# Patient Record
Sex: Male | Born: 1950
Health system: Southern US, Community
[De-identification: ages and names within clinical notes are randomized; demographics above are authoritative.]

## PROBLEM LIST (undated history)

## (undated) DIAGNOSIS — A4902 Methicillin resistant Staphylococcus aureus infection, unspecified site: Secondary | ICD-10-CM

## (undated) DIAGNOSIS — B3324 Viral cardiomyopathy: Secondary | ICD-10-CM

## (undated) DIAGNOSIS — T7840XA Allergy, unspecified, initial encounter: Secondary | ICD-10-CM

## (undated) DIAGNOSIS — I429 Cardiomyopathy, unspecified: Secondary | ICD-10-CM

## (undated) DIAGNOSIS — F1190 Opioid use, unspecified, uncomplicated: Secondary | ICD-10-CM

## (undated) DIAGNOSIS — I10 Essential (primary) hypertension: Secondary | ICD-10-CM

## (undated) DIAGNOSIS — T8859XA Other complications of anesthesia, initial encounter: Secondary | ICD-10-CM

## (undated) DIAGNOSIS — E039 Hypothyroidism, unspecified: Secondary | ICD-10-CM

## (undated) DIAGNOSIS — R748 Abnormal levels of other serum enzymes: Secondary | ICD-10-CM

## (undated) DIAGNOSIS — F1910 Other psychoactive substance abuse, uncomplicated: Secondary | ICD-10-CM

## (undated) DIAGNOSIS — G4733 Obstructive sleep apnea (adult) (pediatric): Secondary | ICD-10-CM

## (undated) DIAGNOSIS — Z923 Personal history of irradiation: Secondary | ICD-10-CM

## (undated) DIAGNOSIS — T4145XA Adverse effect of unspecified anesthetic, initial encounter: Secondary | ICD-10-CM

## (undated) DIAGNOSIS — I5041 Acute combined systolic (congestive) and diastolic (congestive) heart failure: Secondary | ICD-10-CM

## (undated) DIAGNOSIS — K5730 Diverticulosis of large intestine without perforation or abscess without bleeding: Secondary | ICD-10-CM

## (undated) DIAGNOSIS — J45909 Unspecified asthma, uncomplicated: Secondary | ICD-10-CM

## (undated) DIAGNOSIS — Z973 Presence of spectacles and contact lenses: Secondary | ICD-10-CM

## (undated) DIAGNOSIS — N189 Chronic kidney disease, unspecified: Secondary | ICD-10-CM

## (undated) DIAGNOSIS — M549 Dorsalgia, unspecified: Secondary | ICD-10-CM

## (undated) DIAGNOSIS — M1990 Unspecified osteoarthritis, unspecified site: Secondary | ICD-10-CM

## (undated) DIAGNOSIS — D62 Acute posthemorrhagic anemia: Secondary | ICD-10-CM

## (undated) DIAGNOSIS — R0600 Dyspnea, unspecified: Secondary | ICD-10-CM

## (undated) DIAGNOSIS — N400 Enlarged prostate without lower urinary tract symptoms: Secondary | ICD-10-CM

## (undated) DIAGNOSIS — K635 Polyp of colon: Secondary | ICD-10-CM

## (undated) DIAGNOSIS — Z789 Other specified health status: Secondary | ICD-10-CM

## (undated) DIAGNOSIS — Z972 Presence of dental prosthetic device (complete) (partial): Secondary | ICD-10-CM

## (undated) HISTORY — DX: Other psychoactive substance abuse, uncomplicated: F19.10

## (undated) HISTORY — DX: Unspecified asthma, uncomplicated: J45.909

## (undated) HISTORY — DX: Chronic kidney disease, unspecified: N18.9

## (undated) HISTORY — DX: Obstructive sleep apnea (adult) (pediatric): G47.33

## (undated) HISTORY — DX: Allergy, unspecified, initial encounter: T78.40XA

## (undated) HISTORY — PX: COLONOSCOPY: SHX174

## (undated) HISTORY — DX: Essential (primary) hypertension: I10

---
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
## 1998-01-21 ENCOUNTER — Ambulatory Visit (HOSPITAL_COMMUNITY): Admission: RE | Admit: 1998-01-21 | Discharge: 1998-01-21 | Payer: Self-pay | Admitting: Family Medicine

## 1998-11-30 ENCOUNTER — Encounter: Payer: Self-pay | Admitting: Internal Medicine

## 1998-11-30 ENCOUNTER — Ambulatory Visit (HOSPITAL_COMMUNITY): Admission: RE | Admit: 1998-11-30 | Discharge: 1998-11-30 | Payer: Self-pay | Admitting: Internal Medicine

## 2002-05-19 ENCOUNTER — Ambulatory Visit (HOSPITAL_COMMUNITY): Admission: RE | Admit: 2002-05-19 | Discharge: 2002-05-19 | Payer: Self-pay | Admitting: Internal Medicine

## 2002-06-27 ENCOUNTER — Ambulatory Visit (HOSPITAL_COMMUNITY): Admission: RE | Admit: 2002-06-27 | Discharge: 2002-06-27 | Payer: Self-pay | Admitting: Gastroenterology

## 2002-06-27 ENCOUNTER — Encounter (INDEPENDENT_AMBULATORY_CARE_PROVIDER_SITE_OTHER): Payer: Self-pay | Admitting: Specialist

## 2008-11-19 ENCOUNTER — Ambulatory Visit: Payer: Self-pay | Admitting: Vascular Surgery

## 2008-11-19 ENCOUNTER — Encounter: Payer: Self-pay | Admitting: Family Medicine

## 2008-11-19 ENCOUNTER — Ambulatory Visit (HOSPITAL_COMMUNITY): Admission: RE | Admit: 2008-11-19 | Discharge: 2008-11-19 | Payer: Self-pay | Admitting: Family Medicine

## 2011-05-24 ENCOUNTER — Other Ambulatory Visit (HOSPITAL_COMMUNITY): Payer: Self-pay | Admitting: Internal Medicine

## 2011-05-24 DIAGNOSIS — R0989 Other specified symptoms and signs involving the circulatory and respiratory systems: Secondary | ICD-10-CM

## 2011-05-25 ENCOUNTER — Ambulatory Visit (HOSPITAL_COMMUNITY): Payer: BC Managed Care – PPO | Attending: Internal Medicine | Admitting: Radiology

## 2011-05-25 DIAGNOSIS — R0609 Other forms of dyspnea: Secondary | ICD-10-CM | POA: Insufficient documentation

## 2011-05-25 DIAGNOSIS — I379 Nonrheumatic pulmonary valve disorder, unspecified: Secondary | ICD-10-CM | POA: Insufficient documentation

## 2011-05-25 DIAGNOSIS — I059 Rheumatic mitral valve disease, unspecified: Secondary | ICD-10-CM | POA: Insufficient documentation

## 2011-05-25 DIAGNOSIS — R0989 Other specified symptoms and signs involving the circulatory and respiratory systems: Secondary | ICD-10-CM | POA: Insufficient documentation

## 2011-05-25 DIAGNOSIS — R609 Edema, unspecified: Secondary | ICD-10-CM | POA: Insufficient documentation

## 2011-05-26 ENCOUNTER — Encounter (HOSPITAL_COMMUNITY): Payer: Self-pay | Admitting: Internal Medicine

## 2011-06-09 ENCOUNTER — Ambulatory Visit (INDEPENDENT_AMBULATORY_CARE_PROVIDER_SITE_OTHER): Payer: BC Managed Care – PPO | Admitting: *Deleted

## 2011-06-09 DIAGNOSIS — R609 Edema, unspecified: Secondary | ICD-10-CM

## 2011-06-13 ENCOUNTER — Other Ambulatory Visit (HOSPITAL_COMMUNITY): Payer: Self-pay | Admitting: Internal Medicine

## 2011-06-13 DIAGNOSIS — E0590 Thyrotoxicosis, unspecified without thyrotoxic crisis or storm: Secondary | ICD-10-CM

## 2011-06-15 ENCOUNTER — Other Ambulatory Visit: Payer: Self-pay | Admitting: Cardiology

## 2011-06-15 DIAGNOSIS — I872 Venous insufficiency (chronic) (peripheral): Secondary | ICD-10-CM

## 2011-06-20 ENCOUNTER — Ambulatory Visit
Admission: RE | Admit: 2011-06-20 | Discharge: 2011-06-20 | Disposition: A | Discharge status: Home / Self Care | Payer: BC Managed Care – PPO | Source: Ambulatory Visit | Attending: Cardiology | Admitting: Cardiology

## 2011-06-20 DIAGNOSIS — I872 Venous insufficiency (chronic) (peripheral): Secondary | ICD-10-CM

## 2011-06-21 NOTE — Procedures (Unsigned)
DUPLEX DEEP VENOUS EXAM - LOWER EXTREMITY  INDICATION:  Bilateral lower extremity edema.  HISTORY:  Edema:  Yes times 1 month. Trauma/Surgery:  No. Pain:  Yes. PE:  No. Previous DVT:  No. Anticoagulants:  No. Other:  DUPLEX EXAM:               CFV   SFV   PopV  PTV     GSV               R  L  R  L  R  L  R   L   R  L Thrombosis    o  o  o  o  o  o  nv  nv  o  o Spontaneous   +  +  +  +  +  +          +  + Phasic        +  +  +  +  +  +          +  + Augmentation  +  +  +  +  +  +          +  + Compressible  +  +  +  +  +  +          +  + Competent     +  +  +  +  +  +          +  +  Legend:  + - yes  o - no  p - partial  D - decreased  IMPRESSION: 1. No evidence of deep venous thrombosis bilaterally. 2. Enlarged lymph node in the right groin measuring 1.53 cm x 2.32 cm.     Left groin lymph node was noted measuring 1.32 cm x 2.68 cm. 3. Preliminary results were called to [facility] at Dr. Ferd Hibbs office.      _____________________________ Larina Earthly, M.D.  EM/MEDQ  D:  06/09/2011  T:  06/09/2011  Job:  [id number]

## 2011-06-26 ENCOUNTER — Encounter (HOSPITAL_COMMUNITY)
Admission: RE | Admit: 2011-06-26 | Discharge: 2011-06-26 | Disposition: A | Discharge status: Home / Self Care | Payer: BC Managed Care – PPO | Source: Ambulatory Visit | Attending: Internal Medicine | Admitting: Internal Medicine

## 2011-06-26 DIAGNOSIS — R5383 Other fatigue: Secondary | ICD-10-CM | POA: Insufficient documentation

## 2011-06-26 DIAGNOSIS — R5381 Other malaise: Secondary | ICD-10-CM | POA: Insufficient documentation

## 2011-06-26 DIAGNOSIS — R259 Unspecified abnormal involuntary movements: Secondary | ICD-10-CM | POA: Insufficient documentation

## 2011-06-26 DIAGNOSIS — E0590 Thyrotoxicosis, unspecified without thyrotoxic crisis or storm: Secondary | ICD-10-CM

## 2011-06-27 ENCOUNTER — Encounter (HOSPITAL_COMMUNITY)
Admission: RE | Admit: 2011-06-27 | Discharge: 2011-06-27 | Disposition: A | Discharge status: Home / Self Care | Payer: BC Managed Care – PPO | Source: Ambulatory Visit | Attending: Internal Medicine | Admitting: Internal Medicine

## 2011-06-27 MED ORDER — SODIUM PERTECHNETATE TC 99M INJECTION
10.0000 | Freq: Once | INTRAVENOUS | Status: AC | PRN
  Administered 2011-06-27: 10 via INTRAVENOUS

## 2011-06-27 MED ORDER — SODIUM IODIDE I 131 CAPSULE
11.5000 | Freq: Once | INTRAVENOUS | Status: AC | PRN
  Administered 2011-06-27: 11.5 via ORAL

## 2011-06-30 ENCOUNTER — Other Ambulatory Visit (HOSPITAL_COMMUNITY): Payer: Self-pay | Admitting: Internal Medicine

## 2011-06-30 DIAGNOSIS — E0500 Thyrotoxicosis with diffuse goiter without thyrotoxic crisis or storm: Secondary | ICD-10-CM

## 2011-07-07 ENCOUNTER — Encounter (HOSPITAL_COMMUNITY)
Admission: RE | Admit: 2011-07-07 | Discharge: 2011-07-07 | Disposition: A | Discharge status: Home / Self Care | Payer: BC Managed Care – PPO | Source: Ambulatory Visit | Attending: Internal Medicine | Admitting: Internal Medicine

## 2011-07-07 ENCOUNTER — Encounter (HOSPITAL_COMMUNITY): Payer: Self-pay

## 2011-07-07 DIAGNOSIS — E0500 Thyrotoxicosis with diffuse goiter without thyrotoxic crisis or storm: Secondary | ICD-10-CM | POA: Insufficient documentation

## 2011-07-07 MED ORDER — SODIUM IODIDE I 131 CAPSULE
15.2000 | Freq: Once | INTRAVENOUS | Status: AC | PRN
  Administered 2011-07-07: 15.2 via ORAL

## 2012-07-12 ENCOUNTER — Encounter: Payer: Self-pay | Admitting: Gastroenterology

## 2012-09-25 DIAGNOSIS — B3324 Viral cardiomyopathy: Secondary | ICD-10-CM

## 2012-09-25 HISTORY — DX: Viral cardiomyopathy: B33.24

## 2012-09-25 HISTORY — PX: CARDIAC CATHETERIZATION: SHX172

## 2013-03-10 ENCOUNTER — Emergency Department (HOSPITAL_COMMUNITY): Payer: 59

## 2013-03-10 ENCOUNTER — Encounter (HOSPITAL_COMMUNITY): Payer: Self-pay | Admitting: Cardiology

## 2013-03-10 ENCOUNTER — Inpatient Hospital Stay (HOSPITAL_COMMUNITY)
Admission: EM | Admit: 2013-03-10 | Discharge: 2013-03-12 | DRG: 281 | Disposition: A | Discharge status: Home / Self Care | Payer: 59 | Attending: Cardiology | Admitting: Cardiology

## 2013-03-10 ENCOUNTER — Other Ambulatory Visit: Payer: Self-pay

## 2013-03-10 ENCOUNTER — Ambulatory Visit (INDEPENDENT_AMBULATORY_CARE_PROVIDER_SITE_OTHER): Payer: 59 | Admitting: Emergency Medicine

## 2013-03-10 ENCOUNTER — Ambulatory Visit: Payer: 59

## 2013-03-10 VITALS — BP 144/92 | HR 112 | Temp 98.1°F | Resp 18 | Ht 72.25 in | Wt 234.4 lb

## 2013-03-10 DIAGNOSIS — I428 Other cardiomyopathies: Secondary | ICD-10-CM | POA: Diagnosis present

## 2013-03-10 DIAGNOSIS — E039 Hypothyroidism, unspecified: Secondary | ICD-10-CM | POA: Diagnosis present

## 2013-03-10 DIAGNOSIS — E0500 Thyrotoxicosis with diffuse goiter without thyrotoxic crisis or storm: Secondary | ICD-10-CM | POA: Diagnosis present

## 2013-03-10 DIAGNOSIS — R000 Tachycardia, unspecified: Secondary | ICD-10-CM | POA: Diagnosis present

## 2013-03-10 DIAGNOSIS — R0600 Dyspnea, unspecified: Secondary | ICD-10-CM

## 2013-03-10 DIAGNOSIS — R0989 Other specified symptoms and signs involving the circulatory and respiratory systems: Secondary | ICD-10-CM | POA: Diagnosis present

## 2013-03-10 DIAGNOSIS — J4489 Other specified chronic obstructive pulmonary disease: Secondary | ICD-10-CM | POA: Diagnosis present

## 2013-03-10 DIAGNOSIS — R5381 Other malaise: Secondary | ICD-10-CM

## 2013-03-10 DIAGNOSIS — I10 Essential (primary) hypertension: Secondary | ICD-10-CM | POA: Diagnosis present

## 2013-03-10 DIAGNOSIS — R7309 Other abnormal glucose: Secondary | ICD-10-CM | POA: Diagnosis present

## 2013-03-10 DIAGNOSIS — R609 Edema, unspecified: Secondary | ICD-10-CM | POA: Diagnosis present

## 2013-03-10 DIAGNOSIS — R0609 Other forms of dyspnea: Secondary | ICD-10-CM | POA: Diagnosis present

## 2013-03-10 DIAGNOSIS — J449 Chronic obstructive pulmonary disease, unspecified: Secondary | ICD-10-CM | POA: Diagnosis present

## 2013-03-10 DIAGNOSIS — E860 Dehydration: Secondary | ICD-10-CM

## 2013-03-10 DIAGNOSIS — J45909 Unspecified asthma, uncomplicated: Secondary | ICD-10-CM

## 2013-03-10 DIAGNOSIS — R0601 Orthopnea: Secondary | ICD-10-CM | POA: Diagnosis present

## 2013-03-10 DIAGNOSIS — IMO0002 Reserved for concepts with insufficient information to code with codable children: Secondary | ICD-10-CM

## 2013-03-10 DIAGNOSIS — Z87891 Personal history of nicotine dependence: Secondary | ICD-10-CM

## 2013-03-10 DIAGNOSIS — E8770 Fluid overload, unspecified: Secondary | ICD-10-CM | POA: Diagnosis present

## 2013-03-10 DIAGNOSIS — I5041 Acute combined systolic (congestive) and diastolic (congestive) heart failure: Secondary | ICD-10-CM | POA: Diagnosis present

## 2013-03-10 DIAGNOSIS — R7989 Other specified abnormal findings of blood chemistry: Secondary | ICD-10-CM | POA: Diagnosis present

## 2013-03-10 DIAGNOSIS — I214 Non-ST elevation (NSTEMI) myocardial infarction: Secondary | ICD-10-CM | POA: Diagnosis present

## 2013-03-10 DIAGNOSIS — R748 Abnormal levels of other serum enzymes: Secondary | ICD-10-CM | POA: Diagnosis present

## 2013-03-10 DIAGNOSIS — R011 Cardiac murmur, unspecified: Secondary | ICD-10-CM

## 2013-03-10 DIAGNOSIS — R079 Chest pain, unspecified: Secondary | ICD-10-CM

## 2013-03-10 DIAGNOSIS — E785 Hyperlipidemia, unspecified: Secondary | ICD-10-CM | POA: Diagnosis present

## 2013-03-10 DIAGNOSIS — I509 Heart failure, unspecified: Secondary | ICD-10-CM | POA: Diagnosis present

## 2013-03-10 DIAGNOSIS — R0602 Shortness of breath: Secondary | ICD-10-CM

## 2013-03-10 DIAGNOSIS — R9431 Abnormal electrocardiogram [ECG] [EKG]: Secondary | ICD-10-CM

## 2013-03-10 DIAGNOSIS — I5043 Acute on chronic combined systolic (congestive) and diastolic (congestive) heart failure: Principal | ICD-10-CM | POA: Diagnosis present

## 2013-03-10 HISTORY — DX: Acute combined systolic (congestive) and diastolic (congestive) heart failure: I50.41

## 2013-03-10 LAB — CBC WITH DIFFERENTIAL/PLATELET
Basophils Absolute: 0 10*3/uL (ref 0.0–0.1)
Basophils Relative: 1 % (ref 0–1)
Eosinophils Absolute: 0 10*3/uL (ref 0.0–0.7)
Eosinophils Relative: 1 % (ref 0–5)
HCT: 38.3 % — ABNORMAL LOW (ref 39.0–52.0)
Hemoglobin: 13.2 g/dL (ref 13.0–17.0)
Lymphocytes Relative: 44 % (ref 12–46)
Lymphs Abs: 1.9 10*3/uL (ref 0.7–4.0)
MCH: 29 pg (ref 26.0–34.0)
MCHC: 34.5 g/dL (ref 30.0–36.0)
MCV: 84.2 fL (ref 78.0–100.0)
Monocytes Absolute: 0.5 10*3/uL (ref 0.1–1.0)
Monocytes Relative: 12 % (ref 3–12)
Neutro Abs: 1.8 10*3/uL (ref 1.7–7.7)
Neutrophils Relative %: 43 % (ref 43–77)
Platelets: 260 10*3/uL (ref 150–400)
RBC: 4.55 MIL/uL (ref 4.22–5.81)
RDW: 14.7 % (ref 11.5–15.5)
WBC: 4.2 10*3/uL (ref 4.0–10.5)

## 2013-03-10 LAB — POCT I-STAT TROPONIN I
Troponin i, poc: 0.04 ng/mL (ref 0.00–0.08)
Troponin i, poc: 0.08 ng/mL (ref 0.00–0.08)

## 2013-03-10 LAB — BASIC METABOLIC PANEL
BUN: 14 mg/dL (ref 6–23)
CO2: 25 mEq/L (ref 19–32)
Calcium: 9.4 mg/dL (ref 8.4–10.5)
Chloride: 102 mEq/L (ref 96–112)
Creatinine, Ser: 1.29 mg/dL (ref 0.50–1.35)
GFR calc Af Amer: 68 mL/min — ABNORMAL LOW (ref >90)
GFR calc non Af Amer: 58 mL/min — ABNORMAL LOW (ref >90)
Glucose, Bld: 95 mg/dL (ref 70–99)
Potassium: 3.7 mEq/L (ref 3.5–5.1)
Sodium: 139 mEq/L (ref 135–145)

## 2013-03-10 LAB — PRO B NATRIURETIC PEPTIDE: Pro B Natriuretic peptide (BNP): 993.9 pg/mL — ABNORMAL HIGH (ref 0–125)

## 2013-03-10 LAB — TROPONIN I: Troponin I: <0.3 ng/mL (ref <0.30)

## 2013-03-10 MED ORDER — FUROSEMIDE 10 MG/ML IJ SOLN
40.0000 mg | Freq: Once | INTRAMUSCULAR | Status: AC
  Administered 2013-03-10: 40 mg via INTRAVENOUS
  Filled 2013-03-10: qty 4

## 2013-03-10 NOTE — ED Provider Notes (Signed)
History     CSN: [id number]  Arrival date & time 03/10/13  1416   First MD Initiated Contact with Patient 03/10/13 1500      Chief Complaint  Patient presents with  . Weakness  . Chest Pain    (Consider location/radiation/quality/duration/timing/severity/associated sxs/prior treatment) Patient is a 62 y.o. male presenting with chest pain.  Chest Pain Pain location:  Substernal area Pain quality: pressure   Pain radiates to:  Does not radiate Pain severity:  Mild Onset quality:  Gradual Duration:  2 weeks (lasting approximately 2-3 minutes on each occasion) Timing:  Intermittent Progression:  Waxing and waning Chronicity:  Recurrent Context: breathing   Context: not lifting and no movement   Relieved by:  Nothing Worsened by:  Nothing tried Ineffective treatments:  None tried Associated symptoms: shortness of breath   Associated symptoms: no abdominal pain, no back pain, no cough, no dizziness, no fever, no headache, no nausea and not vomiting   Risk factors: hypertension   Risk factors: no diabetes mellitus, no prior DVT/PE and no smoking     Past Medical History  Diagnosis Date  . Grave's disease   . Allergy   . Asthma   . Hypertension   . Substance abuse   . Graves disease     History reviewed. No pertinent past surgical history.  History reviewed. No pertinent family history.  History  Substance Use Topics  . Smoking status: Former Games developer  . Smokeless tobacco: Not on file  . Alcohol Use: No      Review of Systems  Constitutional: Negative for fever and chills.  HENT: Negative for congestion, sore throat and rhinorrhea.   Eyes: Negative for photophobia and visual disturbance.  Respiratory: Positive for shortness of breath. Negative for cough.   Cardiovascular: Positive for chest pain. Negative for leg swelling.  Gastrointestinal: Negative for nausea, vomiting, abdominal pain, diarrhea and constipation.  Endocrine: Negative for polydipsia and  polyuria.  Genitourinary: Negative for dysuria and hematuria.  Musculoskeletal: Negative for back pain and arthralgias.  Skin: Negative for color change and rash.  Neurological: Negative for dizziness, syncope, light-headedness and headaches.  Hematological: Negative for adenopathy. Does not bruise/bleed easily.  All other systems reviewed and are negative.    Allergies  Keflex  Home Medications   Current Outpatient Rx  Name  Route  Sig  Dispense  Refill  . acetaminophen (TYLENOL) 500 MG tablet   Oral   Take 1,500 mg by mouth 5 (five) times daily as needed for pain.         Marland Kitchen albuterol (PROVENTIL HFA;VENTOLIN HFA) 108 (90 BASE) MCG/ACT inhaler   Inhalation   Inhale 2 puffs into the lungs every 6 (six) hours as needed for wheezing.         Marland Kitchen levothyroxine (SYNTHROID, LEVOTHROID) 175 MCG tablet   Oral   Take 175 mcg by mouth daily before breakfast.         . Multiple Vitamin (MULTIVITAMIN WITH MINERALS) TABS   Oral   Take 1 tablet by mouth daily.         Marland Kitchen olmesartan-hydrochlorothiazide (BENICAR HCT) 20-12.5 MG per tablet   Oral   Take 1 tablet by mouth daily.         . Omega-3 Fatty Acids (FISH OIL PO)   Oral   Take 1 capsule by mouth daily.         . Pseudoephedrine HCl (SUDAFED PO)   Oral   Take 1 tablet by mouth 2 (two)  times daily as needed (to enhance effectiveness of inhaler).          . vitamin B-12 (CYANOCOBALAMIN) 100 MCG tablet   Oral   Take 50 mcg by mouth daily.           BP 150/90  Pulse 113  Temp(Src) 98.9 F (37.2 C) (Oral)  Resp 20  SpO2 98%  Physical Exam  Vitals reviewed. Constitutional: He is oriented to person, place, and time. He appears well-developed and well-nourished.  HENT:  Head: Normocephalic and atraumatic.  Eyes: Conjunctivae and EOM are normal.  Neck: Normal range of motion. Neck supple.  Cardiovascular: Normal rate, regular rhythm and normal heart sounds.   Pulmonary/Chest: Effort normal and breath sounds  normal. No respiratory distress.  Abdominal: He exhibits no distension. There is no tenderness. There is no rebound and no guarding.  Musculoskeletal: Normal range of motion.  Neurological: He is alert and oriented to person, place, and time.  Skin: Skin is warm and dry.    ED Course  Procedures (including critical care time)  Labs Reviewed  CBC WITH DIFFERENTIAL - Abnormal; Notable for the following:    HCT 38.3 (*)    All other components within normal limits  BASIC METABOLIC PANEL - Abnormal; Notable for the following:    GFR calc non Af Amer 58 (*)    GFR calc Af Amer 68 (*)    All other components within normal limits  PRO B NATRIURETIC PEPTIDE - Abnormal; Notable for the following:    Pro B Natriuretic peptide (BNP) 993.9 (*)    All other components within normal limits  CK TOTAL AND CKMB - Abnormal; Notable for the following:    Total CK 2559 (*)    CK, MB 28.7 (*)    All other components within normal limits  TROPONIN I  D-DIMER, QUANTITATIVE  TSH  T4, FREE  POCT I-STAT TROPONIN I  POCT I-STAT TROPONIN I   Dg Chest 2 View  03/10/2013   *RADIOLOGY REPORT*  Clinical Data: Chest pain and shortness of breath.  Fatigue.  Ex- smoker.  CHEST - 2 VIEW  Comparison: None.  Findings: Normal sized heart.  Tortuous aorta.  Clear lungs with bilateral nipple shadows.  The lungs are mildly hyperexpanded with mildly prominent interstitial markings.  Minimal lower thoracic spine degenerative changes.  IMPRESSION:  1.  No acute abnormality. 2.  Mild changes of COPD.   Original Report Authenticated By: Beckie Salts, M.D.     1. Chest pain   2. Dyspnea       Date: 03/10/2013  Rate: 103  Rhythm: sinus tachycardia  QRS Axis: normal  Intervals: normal  ST/T Wave abnormalities: indeterminate  Conduction Disutrbances:left anterior fascicular block  Narrative Interpretation:   Old EKG Reviewed: none available   MDM  62 y.o. male  with pertinent PMH of Asthma, graves disease, HTN  presents with chest pain and dyspnea x2 weeks acutely worsening today. Pt states symptoms are very brief and self limited, approximately lasting 2 minutes, with pressure like pain and dyspnea leaving him tired.  Nonexertional, no gi symptoms. Pt has also had R leg swelling over same time period.  On arrival today, pt tachycardic, not currently in any pain.  R leg mildly swollen when compared to left.  No ho DVT.  DDimer negative, pt given NS bolus with improvement in HR.  Initial troponin and labwork negative.  Repeat troponin .08, followed by .04 at 9 hours.  CK elevated. Spoke with PCP  multiple times (also admitting officer), ultimately decision made to admit for tele monitoring overnight.  Will check serial troponin.  Temporary admit orders placed.  No pain to indicate need for nitro.  BNP mildly elevated.  Admitted in stable condition. .    Labs and imaging as above reviewed by myself and attending,Dr. Jeraldine Loots, with whom case was discussed.   1. Chest pain   2. Dyspnea             Noel Gerold, MD 03/11/13 [phone]

## 2013-03-10 NOTE — ED Notes (Signed)
Pt to department via EMS from [facility]- pt reports that he has had some generalized weakness over the past couple of days. States that he also had some intermittent chest pain and SOB. Bp-132/90 Hr-102 20g LAC.

## 2013-03-10 NOTE — ED Notes (Signed)
Report given to Darl Pikes, RN.

## 2013-03-10 NOTE — Progress Notes (Signed)
  Subjective:    Patient ID: Peter [name], male    DOB: [birth year], 62 y.o.   MRN: [id number]  HPI  Chest congestion for two plus weeks. Was on prednisone at the end of April.  After that course of medicine he felt like he started deteriating.  Over the last two weeks, he feels like he is getting worse.  Taking a lot of over the counter meds.  Going through an albuterol inhaler every week.  Does not have a physical job.  Also has a fluid build up on right knee and wears compression stockings.  Has seen Dr. Timothy Lasso for the ailments.  Prednisone helped but is out of his systems.  Has graves disease and has had a thyroid ablation.  Feels like he is not mentally or physically healthy.  Does not recall having a chest xray.  Has an appointment on July 8.  He is wiped out by the time he gets home from work.. patient states he sleeps sitting up.    Review of Systems     Objective:   Physical Exam patient is alert and cooperative he is not in distress. Chest exam reveals rales to about mid lung on the left. Cardiac exam reveals a questionable gallop rhythm with a 2/6 systolic murmur at the left sternal border. Abdomen soft nontender right lower leg is swollen no definite calf tenderness.        Assessment & Plan:  Patient presents with an abnormal EKG, heart murmur, extreme fatigue, and shortness of breath. His history is suspicious that he possibly has some form of cardiomyopathy and possibly may have infarcted sometime the last couple months. O2 started we'll send to the hospital for evaluation. He also has significant swelling of his right leg and will need a d-dimer as well as cardiac enzymes. A linear and echo to further evaluate the murmur .

## 2013-03-10 NOTE — ED Notes (Addendum)
CANCEL RECTAL TEMPERATURE PER EDP.

## 2013-03-10 NOTE — ED Notes (Signed)
Per resident EDP, the patient is NOT to be discharged at this time.  He is to be held for a 3rd troponin check.

## 2013-03-11 ENCOUNTER — Encounter (HOSPITAL_COMMUNITY)
Admission: EM | Disposition: A | Discharge status: Home / Self Care | Payer: Self-pay | Source: Home / Self Care | Attending: Internal Medicine

## 2013-03-11 DIAGNOSIS — R748 Abnormal levels of other serum enzymes: Secondary | ICD-10-CM

## 2013-03-11 DIAGNOSIS — E8770 Fluid overload, unspecified: Secondary | ICD-10-CM | POA: Diagnosis present

## 2013-03-11 DIAGNOSIS — I10 Essential (primary) hypertension: Secondary | ICD-10-CM | POA: Diagnosis present

## 2013-03-11 DIAGNOSIS — E039 Hypothyroidism, unspecified: Secondary | ICD-10-CM | POA: Diagnosis present

## 2013-03-11 DIAGNOSIS — I5041 Acute combined systolic (congestive) and diastolic (congestive) heart failure: Secondary | ICD-10-CM | POA: Diagnosis present

## 2013-03-11 HISTORY — DX: Acute combined systolic (congestive) and diastolic (congestive) heart failure: I50.41

## 2013-03-11 HISTORY — DX: Abnormal levels of other serum enzymes: R74.8

## 2013-03-11 HISTORY — PX: LEFT AND RIGHT HEART CATHETERIZATION WITH CORONARY ANGIOGRAM: SHX5449

## 2013-03-11 LAB — CBC
HCT: 39.4 % (ref 39.0–52.0)
Platelets: 262 10*3/uL (ref 150–400)
RDW: 14.6 % (ref 11.5–15.5)
WBC: 7.3 10*3/uL (ref 4.0–10.5)

## 2013-03-11 LAB — POCT I-STAT 3, ART BLOOD GAS (G3+)
Acid-Base Excess: 2 mmol/L (ref 0.0–2.0)
pO2, Arterial: 78 mmHg — ABNORMAL LOW (ref 80.0–100.0)

## 2013-03-11 LAB — COMPREHENSIVE METABOLIC PANEL
Alkaline Phosphatase: 61 U/L (ref 39–117)
BUN: 15 mg/dL (ref 6–23)
CO2: 29 mEq/L (ref 19–32)
GFR calc Af Amer: 66 mL/min — ABNORMAL LOW (ref >90)
GFR calc non Af Amer: 57 mL/min — ABNORMAL LOW (ref >90)
Glucose, Bld: 89 mg/dL (ref 70–99)
Potassium: 4.1 mEq/L (ref 3.5–5.1)
Total Protein: 7.2 g/dL (ref 6.0–8.3)

## 2013-03-11 LAB — PROTIME-INR
INR: 1 (ref 0.00–1.49)
Prothrombin Time: 13.1 seconds (ref 11.6–15.2)

## 2013-03-11 LAB — CK TOTAL AND CKMB (NOT AT ARMC)
Total CK: 2265 U/L — ABNORMAL HIGH (ref 7–232)
Total CK: 2559 U/L — ABNORMAL HIGH (ref 7–232)

## 2013-03-11 LAB — FERRITIN: Ferritin: 130 ng/mL (ref 22–322)

## 2013-03-11 LAB — IRON AND TIBC
Iron: 63 ug/dL (ref 42–135)
TIBC: 293 ug/dL (ref 215–435)
UIBC: 230 ug/dL (ref 125–400)

## 2013-03-11 LAB — POCT I-STAT 3, VENOUS BLOOD GAS (G3P V)
Acid-Base Excess: 3 mmol/L — ABNORMAL HIGH (ref 0.0–2.0)
pO2, Ven: 31 mmHg (ref 30.0–45.0)

## 2013-03-11 LAB — TROPONIN I: Troponin I: <0.3 ng/mL (ref <0.30)

## 2013-03-11 LAB — SEDIMENTATION RATE: Sed Rate: 5 mm/hr (ref 0–16)

## 2013-03-11 SURGERY — LEFT AND RIGHT HEART CATHETERIZATION WITH CORONARY ANGIOGRAM
Anesthesia: LOCAL

## 2013-03-11 MED ORDER — FUROSEMIDE 10 MG/ML IJ SOLN
40.0000 mg | Freq: Two times a day (BID) | INTRAMUSCULAR | Status: DC
  Administered 2013-03-11: 40 mg via INTRAVENOUS
  Filled 2013-03-11 (×3): qty 4

## 2013-03-11 MED ORDER — ADULT MULTIVITAMIN W/MINERALS CH
1.0000 | ORAL_TABLET | Freq: Every day | ORAL | Status: DC
  Administered 2013-03-11 – 2013-03-12 (×2): 1 via ORAL
  Filled 2013-03-11 (×2): qty 1

## 2013-03-11 MED ORDER — NITROGLYCERIN 0.2 MG/ML ON CALL CATH LAB
INTRAVENOUS | Status: AC
  Filled 2013-03-11: qty 1

## 2013-03-11 MED ORDER — LEVOTHYROXINE SODIUM 175 MCG PO TABS
175.0000 ug | ORAL_TABLET | Freq: Every day | ORAL | Status: DC
  Administered 2013-03-11 – 2013-03-12 (×2): 175 ug via ORAL
  Filled 2013-03-11 (×3): qty 1

## 2013-03-11 MED ORDER — SODIUM CHLORIDE 0.9 % IV SOLN: INTRAVENOUS | Status: DC

## 2013-03-11 MED ORDER — SODIUM CHLORIDE 0.9 % IJ SOLN
3.0000 mL | Freq: Two times a day (BID) | INTRAMUSCULAR | Status: DC
  Administered 2013-03-11: 3 mL via INTRAVENOUS

## 2013-03-11 MED ORDER — HEPARIN (PORCINE) IN NACL 2-0.9 UNIT/ML-% IJ SOLN
INTRAMUSCULAR | Status: AC
  Filled 2013-03-11: qty 1000

## 2013-03-11 MED ORDER — MIDAZOLAM HCL 2 MG/2ML IJ SOLN
INTRAMUSCULAR | Status: AC
  Filled 2013-03-11: qty 2

## 2013-03-11 MED ORDER — ONDANSETRON HCL 4 MG/2ML IJ SOLN: 4.0000 mg | Freq: Four times a day (QID) | INTRAMUSCULAR | Status: DC | PRN

## 2013-03-11 MED ORDER — DIGOXIN 0.25 MG/ML IJ SOLN
0.2500 mg | Freq: Every day | INTRAMUSCULAR | Status: DC
  Administered 2013-03-11: 0.25 mg via INTRAVENOUS
  Filled 2013-03-11 (×2): qty 1

## 2013-03-11 MED ORDER — VITAMIN B-12 100 MCG PO TABS
50.0000 ug | ORAL_TABLET | Freq: Every day | ORAL | Status: DC
  Administered 2013-03-11 – 2013-03-12 (×2): 50 ug via ORAL
  Filled 2013-03-11 (×2): qty 1

## 2013-03-11 MED ORDER — ENOXAPARIN SODIUM 150 MG/ML ~~LOC~~ SOLN
1.0000 mg/kg | Freq: Once | SUBCUTANEOUS | Status: AC
  Administered 2013-03-11: 105 mg via SUBCUTANEOUS
  Filled 2013-03-11: qty 1

## 2013-03-11 MED ORDER — SODIUM CHLORIDE 0.9 % IJ SOLN: 3.0000 mL | Freq: Two times a day (BID) | INTRAMUSCULAR | Status: DC

## 2013-03-11 MED ORDER — HYDROMORPHONE HCL PF 2 MG/ML IJ SOLN
INTRAMUSCULAR | Status: AC
  Filled 2013-03-11: qty 1

## 2013-03-11 MED ORDER — LIDOCAINE HCL (PF) 1 % IJ SOLN
INTRAMUSCULAR | Status: AC
  Filled 2013-03-11: qty 30

## 2013-03-11 MED ORDER — IRBESARTAN 150 MG PO TABS
150.0000 mg | ORAL_TABLET | Freq: Every day | ORAL | Status: DC
  Administered 2013-03-11 – 2013-03-12 (×2): 150 mg via ORAL
  Filled 2013-03-11 (×2): qty 1

## 2013-03-11 MED ORDER — CARVEDILOL 3.125 MG PO TABS
3.1250 mg | ORAL_TABLET | Freq: Two times a day (BID) | ORAL | Status: DC
  Administered 2013-03-12: 3.125 mg via ORAL
  Filled 2013-03-11 (×4): qty 1

## 2013-03-11 MED ORDER — ENOXAPARIN SODIUM 40 MG/0.4ML ~~LOC~~ SOLN
40.0000 mg | SUBCUTANEOUS | Status: DC
  Administered 2013-03-11 – 2013-03-12 (×2): 40 mg via SUBCUTANEOUS
  Filled 2013-03-11 (×3): qty 0.4

## 2013-03-11 MED ORDER — OMEGA-3-ACID ETHYL ESTERS 1 G PO CAPS
1.0000 g | ORAL_CAPSULE | Freq: Two times a day (BID) | ORAL | Status: DC
  Administered 2013-03-11 – 2013-03-12 (×3): 1 g via ORAL
  Filled 2013-03-11 (×4): qty 1

## 2013-03-11 MED ORDER — SODIUM CHLORIDE 0.9 % IJ SOLN: 3.0000 mL | INTRAMUSCULAR | Status: DC | PRN

## 2013-03-11 MED ORDER — DIAZEPAM 5 MG PO TABS: 5.0000 mg | ORAL_TABLET | Freq: Four times a day (QID) | ORAL | Status: DC | PRN

## 2013-03-11 MED ORDER — ACETAMINOPHEN 325 MG PO TABS: 650.0000 mg | ORAL_TABLET | ORAL | Status: DC | PRN

## 2013-03-11 MED ORDER — ASPIRIN EC 325 MG PO TBEC
325.0000 mg | DELAYED_RELEASE_TABLET | Freq: Every day | ORAL | Status: DC
  Administered 2013-03-12: 325 mg via ORAL
  Filled 2013-03-11 (×2): qty 1

## 2013-03-11 MED ORDER — SODIUM CHLORIDE 0.9 % IV SOLN: 250.0000 mL | INTRAVENOUS | Status: DC | PRN

## 2013-03-11 MED ORDER — FUROSEMIDE 10 MG/ML IJ SOLN
40.0000 mg | Freq: Every day | INTRAMUSCULAR | Status: DC
  Filled 2013-03-11: qty 4

## 2013-03-11 MED ORDER — ALBUTEROL SULFATE HFA 108 (90 BASE) MCG/ACT IN AERS
2.0000 | INHALATION_SPRAY | Freq: Four times a day (QID) | RESPIRATORY_TRACT | Status: DC | PRN
  Filled 2013-03-11: qty 6.7

## 2013-03-11 MED ORDER — ISOSORB DINITRATE-HYDRALAZINE 20-37.5 MG PO TABS
1.0000 | ORAL_TABLET | Freq: Two times a day (BID) | ORAL | Status: DC
  Administered 2013-03-11 – 2013-03-12 (×3): 1 via ORAL
  Filled 2013-03-11 (×4): qty 1

## 2013-03-11 NOTE — CV Procedure (Signed)
Procedure performed:  Left heart catheterization including hemodynamic monitoring of the left ventricle, LV gram, selective right and left coronary arteriography. Right Heart cath    Indication patient is a 62 year-old male with history of hypertension was admitted to the hospital with worsening shortness of breath and dyspnea on exertion, patient want to be in congestive heart failure, echocardiogram had revealed ejection fraction of 20%, hence  is brought to the cardiac catheterization lab to evaluate his coronary anatomy. Right heart catheterization is being performed to evaluate for pulmonary hypertension due to dyspnea and also to evaluate his cardiac output and cardiac index  Hemodynamic data:  Left ventricular pressure was 104/10with LVEDP of 16 mm mercury. Aortic pressure was 107/80 with a mean of 92 mm mercury. There was no pressure gradient across the aortic valve.   Left ventricle: Performed in the RAO projection revealed LVEF of 20% Dilated LV. Global hypokinesis noted.   Right coronary artery: The vessel is smooth with mild luminal irregularity. It is dominant. No significant stenosis.  Left main coronary artery is large and normal. Next  Circumflex coronary artery: A large vessel giving origin to a large obtuse marginal 1.   LAD:  LAD gives origin to a large diagonal 1. Normal. .   Right Heart Procedural data:  RA pressure 12/9  Mean 10 mm mercury. RA saturation 28%.  RV pressure 10/13 and Right ventricular EDP 13 mm Hg. PA pressure 29/14 with a mean of 22  mm mercury. PA saturation 60%.  Pulmonary capillary wedge 16/16 with a mean of 15 mm Hg. Aortic saturation 96%.  Cardiac output was 4.68 with cardiac index of 2.03  by Fick.   Impression:   Normal right heart catheterizaton with preserved cardiac output and cardiac index. No suggestion of intracardiac shunting with normal Qp:Qs ratio. Normal coronary arteries, findings are suggestive of nonischemic dilated cardiomyopathy  severe LV systolic dysfunction. Borderline decrease in cardiac output and cardiac index in a patient who is 6 feet 2 inches tall.  Technique: Under sterile precautions using a 6 French right femoral arterial access, a 6 French sheath was introduced into the right femoral artery under fluoroscopy guidance. A 5 [place] multipurpose B2 catheter was advanced into the ascending aorta and then into the left ventricle. Hemodynamics are analysed. LV angiogram   performed in RAO projection. Catheter pulled into the ascending aorta and right coronary artery was cannulated, then left coronary artery was cannulated and angiography was performed in multiple views.   A 5 French  sheath introduced into right femoral  vein for access under fluroscopic guidance.  A 5 French Swan-Ganz catheter was advanced with balloon inflated  under fluoroscopic guidance into first the right atrium followed by the right ventricle and into the pulmonary artery to pulmonary artery wedge position. Hemodynamics were obtained in a locations.  After hemodynamics were completed, samples were taken for SaO2% measurement to be used in Fick  Calculation of cardiac output and cardiac index. The catheter was then pulled back the balloon down and then completely out of the body. Hemostasis obtained by manual pressure over the access site. There was no immediate complication, patient tolerated the procedure well

## 2013-03-11 NOTE — H&P (View-Only) (Signed)
CARDIOLOGY CONSULT NOTE  Patient ID: Peter [name] MRN: [id number] DOB/AGE: 06/14/1951 62 y.o.  Admit date: 03/10/2013 Referring Physician  J. Russo, MD Primary Physician:  RUSSO,JOHN M, MD Reason for Consultation  Dyspnea  HPI: Patient is a very pleasant 62-year-old African American male with history of chronic dyspnea and chronic leg edema, I had seen him almost 2 years ago when he presented for evaluation of dyspnea, leg edema, was also hyperthyroid and underwent radioactive iodine therapy presents with marked fatigue, dyspnea which has been ongoing for the past 3-4 months, was treated with bronchodilator therapy and steroids in April, felt well but again over the past 2-3 weeks feels he is completely worn out and minimal activity brings on dyspnea. He is also noticed orthopnea and has been sleeping at 45 angle. No hemoptysis. He is chronic leg edema which he states has been stable and in fact the left leg edema has improved, but he has persistent right leg edema. There is no painful swelling of the lower extremity is, no recent long travel. No hemoptysis or syncope. He denies any chest pain or palpitations. No fever, no cough. No recent weight changes. Patient states that he is pretty much run down a half a day but has been having difficulty mentally to explain to people as he does not want anybody to know that he's feeling this way.   Past Medical History  Diagnosis Date  . Grave's disease   . Allergy   . Asthma   . Hypertension   . Substance abuse   . Graves disease      Family medical history: Father died at age 56-unknown. Mother died at age 48 from pancreatic cancer. 5 half-siblings.  Social History: No smoking. No alcohol. Married. 5 children. 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  History   Social History  . Marital Status: Married    Spouse Name: N/A    Number of Children: N/A  . Years of Education: N/A   Occupational History  . Not on file.   Social  History Main Topics  . Smoking status: Former Smoker  . Smokeless tobacco: Not on file  . Alcohol Use: No  . Drug Use: No  . Sexually Active: Yes   Other Topics Concern  . Not on file   Social History Narrative  . No narrative on file     Prescriptions prior to admission  Medication Sig Dispense Refill  . acetaminophen (TYLENOL) 500 MG tablet Take 1,500 mg by mouth 5 (five) times daily as needed for pain.      . albuterol (PROVENTIL HFA;VENTOLIN HFA) 108 (90 BASE) MCG/ACT inhaler Inhale 2 puffs into the lungs every 6 (six) hours as needed for wheezing.      . levothyroxine (SYNTHROID, LEVOTHROID) 175 MCG tablet Take 175 mcg by mouth daily before breakfast.      . Multiple Vitamin (MULTIVITAMIN WITH MINERALS) TABS Take 1 tablet by mouth daily.      . olmesartan-hydrochlorothiazide (BENICAR HCT) 20-12.5 MG per tablet Take 1 tablet by mouth daily.      . Omega-3 Fatty Acids (FISH OIL PO) Take 1 capsule by mouth daily.      . Pseudoephedrine HCl (SUDAFED PO) Take 1 tablet by mouth 2 (two) times daily as needed (to enhance effectiveness of inhaler).       . vitamin B-12 (CYANOCOBALAMIN) 100 MCG tablet Take 50 mcg by mouth daily.        Scheduled Meds: . aspirin EC    325 mg Oral Daily  . enoxaparin (LOVENOX) injection  40 mg Subcutaneous Q24H  . [START ON 03/12/2013] furosemide  40 mg Intravenous Daily  . irbesartan  150 mg Oral Daily  . levothyroxine  175 mcg Oral QAC breakfast  . multivitamin with minerals  1 tablet Oral Daily  . omega-3 acid ethyl esters  1 g Oral BID  . vitamin B-12  50 mcg Oral Daily   Continuous Infusions:  PRN Meds:.albuterol  ROS: General: no fevers/chills/night sweats Eyes: no blurry vision, diplopia, or amaurosis ENT: no sore throat or hearing loss GI: no abdominal pain, nausea, vomiting, diarrhea, or constipation GU: no dysuria, frequency, or hematuria Skin: no rash Neuro: no headache, numbness, tingling, or weakness of extremities Musculoskeletal:  no joint pain or swelling Heme: no bleeding, DVT, or easy bruising Endo: no polydipsia or polyuria    Physical Exam: Blood pressure 139/97, pulse 99, temperature 97.9 F (36.6 C), temperature source Oral, resp. rate 18, height 6' 2" (1.88 m), weight 105.4 kg (232 lb 5.8 oz), SpO2 99.00%.   General appearance: alert, cooperative, appears stated age, no distress and mildly obese Lungs: clear to auscultation bilaterally Heart: no S3 or S4, no click and no rub Abdomen: soft, non-tender; bowel sounds normal; no masses,  no organomegaly Extremities: edema trace bilateral below knee pitting and non tender. Pulses: 2+ and symmetric Neurologic: Grossly normal  Labs:   Lab Results  Component Value Date   WBC 7.3 03/11/2013   HGB 13.4 03/11/2013   HCT 39.4 03/11/2013   MCV 84.2 03/11/2013   PLT 262 03/11/2013    Recent Labs Lab 03/11/13 0400  NA 141  K 4.1  CL 103  CO2 29  BUN 15  CREATININE 1.31  CALCIUM 9.8  PROT 7.2  BILITOT 0.5  ALKPHOS 61  ALT 55*  AST 53*  GLUCOSE 89   Lab Results  Component Value Date   CKTOTAL 2265* 03/11/2013   CKMB 26.2* 03/11/2013   TROPONINI <0.30 03/11/2013    BNP    Component Value Date/Time   PROBNP 993.9* 03/10/2013 1730    Out patient Echo 05/25/11: Inferosetpal and apical hypokinesis.   EF 50%.   Mild MR.   there was also grade 1 diastolic dysfunction.   No significant valvular abnormality.   Done at [facility].   Outpatient Exercise nuclear stress 06/23/11: Mild diaphragmatic attenuation.   No ischemia.   EF mildly reduced 44%.    Impression: Echocardiogram in stress test images  both correlate very nicely and hence a small infarct in the inferior wall,  or a small ischemic defect cannot be completely excluded.   But overall this is a low-risk stress test.    Outpatient Bilateral LE Venous Duplex 06/20/11: No DVT or venous insufficiency. Bilateral edema noted.  EKG: 03/10/2013: Normal sinus rhythm, left hypertrophy with repolarization  abnormality. Poor R wave progression cannot exclude old anterior infarct.    Radiology: Dg Chest 2 View  03/10/2013   *RADIOLOGY REPORT*  Clinical Data: Chest pain and shortness of breath.  Fatigue.  Ex- smoker.  CHEST - 2 VIEW  Comparison: None.  Findings: Normal sized heart.  Tortuous aorta.  Clear lungs with bilateral nipple shadows.  The lungs are mildly hyperexpanded with mildly prominent interstitial markings.  Minimal lower thoracic spine degenerative changes.  IMPRESSION:  1.  No acute abnormality. 2.  Mild changes of COPD.   Original Report Authenticated By: Steven Reid, M.D.    ASSESSMENT AND PLAN:  1. Patient   presented with marked dyspnea, orthopnea, symptoms suggestive of class III congestive heart failure. 2. Chronic leg edema cannot exclude right heart failure. 3. Elevated CPK, suggest mild rhabdomyolysis with very mild leak in cardiac markers/cardiac injury in the form of elevated serum troponin, suggest NSTEMI versus CHF. Recommendation: I would recommend proceeding with left and right heart catheterization to evaluate his coronary anatomy and also for pulmonary hypertension. I did review his echocardiogram and this shows severe global hypokinesis with LV dilatation with ejection fraction of 10-20%. Make further condition after cardiac catheterization. I have discussed with the patient regarding the risks, benefits and alternatives to cardiac catheterization including but not limited to less than 1% risk of that, stroke, MI, need for urgent CABG but not limited to this. Patient is willing.  Mitsuo Budnick,JAGADEESH R, MD 03/11/2013, 7:17 AM [facility]  Pager: [phone] Office: [phone] If no answer Cell [phone]  
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
## 2013-03-11 NOTE — Interval H&P Note (Signed)
History and Physical Interval Note:  03/11/2013 1:00 PM  Peter [name]  has presented today for surgery, with the diagnosis of shortness of breath  The various methods of treatment have been discussed with the patient and family. After consideration of risks, benefits and other options for treatment, the patient has consented to  Procedure(s): LEFT AND RIGHT HEART CATHETERIZATION WITH CORONARY ANGIOGRAM (N/A) and possible PTCA as a surgical intervention .  The patient's history has been reviewed, patient examined, no change in status, stable for surgery.  I have reviewed the patient's chart and labs.  Questions were answered to the patient's satisfaction.     Pamella Pert

## 2013-03-11 NOTE — ED Provider Notes (Signed)
I saw and evaluated the patient.  I agree with the resident's note (Dr. Littie Deeds)  This patient remained hemodynamically stable throughout his emergency department stay.  Given the inconsistent changes in his troponin and CK-MB, though he was in no distress, he was admitted for further evaluation and management.  I saw the ECG, relevant labs and studies - I agree with the interpretation.   Gerhard Munch, MD 03/11/13 2342

## 2013-03-11 NOTE — Consult Note (Signed)
CARDIOLOGY CONSULT NOTE  Patient ID: Peter [name] MRN: [id number] DOB/AGE: Mar 06, 1951 62 y.o.  Admit date: 03/10/2013 Referring Physician  Jessee Avers, MD Primary Physician:  [name], MD Reason for Consultation  Dyspnea  HPI: Patient is a very pleasant 62 year old African American male with history of chronic dyspnea and chronic leg edema, I had seen him almost 2 years ago when he presented for evaluation of dyspnea, leg edema, was also hyperthyroid and underwent radioactive iodine therapy presents with marked fatigue, dyspnea which has been ongoing for the past 3-4 months, was treated with bronchodilator therapy and steroids in April, felt well but again over the past 2-3 weeks feels he is completely worn out and minimal activity brings on dyspnea. He is also noticed orthopnea and has been sleeping at 45 angle. No hemoptysis. He is chronic leg edema which he states has been stable and in fact the left leg edema has improved, but he has persistent right leg edema. There is no painful swelling of the lower extremity is, no recent long travel. No hemoptysis or syncope. He denies any chest pain or palpitations. No fever, no cough. No recent weight changes. Patient states that he is pretty much run down a half a day but has been having difficulty mentally to explain to people as he does not want anybody to know that he's feeling this way.   Past Medical History  Diagnosis Date  . Grave's disease   . Allergy   . Asthma   . Hypertension   . Substance abuse   . Graves disease      Family medical history: Father died at age 60-unknown. Mother died at age 61 from pancreatic cancer. 5 half-siblings.  Social History: No smoking. No alcohol. Married. 5 children. 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  History   Social History  . Marital Status: Married    Spouse Name: N/A    Number of Children: N/A  . Years of Education: N/A   Occupational History  . Not on file.   Social  History Main Topics  . Smoking status: Former Games developer  . Smokeless tobacco: Not on file  . Alcohol Use: No  . Drug Use: No  . Sexually Active: Yes   Other Topics Concern  . Not on file   Social History Narrative  . No narrative on file     Prescriptions prior to admission  Medication Sig Dispense Refill  . acetaminophen (TYLENOL) 500 MG tablet Take 1,500 mg by mouth 5 (five) times daily as needed for pain.      Marland Kitchen albuterol (PROVENTIL HFA;VENTOLIN HFA) 108 (90 BASE) MCG/ACT inhaler Inhale 2 puffs into the lungs every 6 (six) hours as needed for wheezing.      Marland Kitchen levothyroxine (SYNTHROID, LEVOTHROID) 175 MCG tablet Take 175 mcg by mouth daily before breakfast.      . Multiple Vitamin (MULTIVITAMIN WITH MINERALS) TABS Take 1 tablet by mouth daily.      Marland Kitchen olmesartan-hydrochlorothiazide (BENICAR HCT) 20-12.5 MG per tablet Take 1 tablet by mouth daily.      . Omega-3 Fatty Acids (FISH OIL PO) Take 1 capsule by mouth daily.      . Pseudoephedrine HCl (SUDAFED PO) Take 1 tablet by mouth 2 (two) times daily as needed (to enhance effectiveness of inhaler).       . vitamin B-12 (CYANOCOBALAMIN) 100 MCG tablet Take 50 mcg by mouth daily.        Scheduled Meds: . aspirin EC  325 mg Oral Daily  . enoxaparin (LOVENOX) injection  40 mg Subcutaneous Q24H  . [START ON 03/12/2013] furosemide  40 mg Intravenous Daily  . irbesartan  150 mg Oral Daily  . levothyroxine  175 mcg Oral QAC breakfast  . multivitamin with minerals  1 tablet Oral Daily  . omega-3 acid ethyl esters  1 g Oral BID  . vitamin B-12  50 mcg Oral Daily   Continuous Infusions:  PRN Meds:.albuterol  ROS: General: no fevers/chills/night sweats Eyes: no blurry vision, diplopia, or amaurosis ENT: no sore throat or hearing loss GI: no abdominal pain, nausea, vomiting, diarrhea, or constipation GU: no dysuria, frequency, or hematuria Skin: no rash Neuro: no headache, numbness, tingling, or weakness of extremities Musculoskeletal:  no joint pain or swelling Heme: no bleeding, DVT, or easy bruising Endo: no polydipsia or polyuria    Physical Exam: Blood pressure 139/97, pulse 99, temperature 97.9 F (36.6 C), temperature source Oral, resp. rate 18, height 6\' 2"  (1.88 m), weight 105.4 kg (232 lb 5.8 oz), SpO2 99.00%.   General appearance: alert, cooperative, appears stated age, no distress and mildly obese Lungs: clear to auscultation bilaterally Heart: no S3 or S4, no click and no rub Abdomen: soft, non-tender; bowel sounds normal; no masses,  no organomegaly Extremities: edema trace bilateral below knee pitting and non tender. Pulses: 2+ and symmetric Neurologic: Grossly normal  Labs:   Lab Results  Component Value Date   WBC 7.3 03/11/2013   HGB 13.4 03/11/2013   HCT 39.4 03/11/2013   MCV 84.2 03/11/2013   PLT 262 03/11/2013    Recent Labs Lab 03/11/13 0400  NA 141  K 4.1  CL 103  CO2 29  BUN 15  CREATININE 1.31  CALCIUM 9.8  PROT 7.2  BILITOT 0.5  ALKPHOS 61  ALT 55*  AST 53*  GLUCOSE 89   Lab Results  Component Value Date   CKTOTAL 2265* 03/11/2013   CKMB 26.2* 03/11/2013   TROPONINI <0.30 03/11/2013    BNP    Component Value Date/Time   PROBNP 993.9* 03/10/2013 1730    Out patient Echo 05/25/11: Inferosetpal and apical hypokinesis.   EF 50%.   Mild MR.   there was also grade 1 diastolic dysfunction.   No significant valvular abnormality.   Done at [facility].   Outpatient Exercise nuclear stress 06/23/11: Mild diaphragmatic attenuation.   No ischemia.   EF mildly reduced 44%.    Impression: Echocardiogram in stress test images  both correlate very nicely and hence a small infarct in the inferior wall,  or a small ischemic defect cannot be completely excluded.   But overall this is a low-risk stress test.    Outpatient Bilateral LE Venous Duplex 06/20/11: No DVT or venous insufficiency. Bilateral edema noted.  EKG: 03/10/2013: Normal sinus rhythm, left hypertrophy with repolarization  abnormality. Poor R wave progression cannot exclude old anterior infarct.    Radiology: Dg Chest 2 View  03/10/2013   *RADIOLOGY REPORT*  Clinical Data: Chest pain and shortness of breath.  Fatigue.  Ex- smoker.  CHEST - 2 VIEW  Comparison: None.  Findings: Normal sized heart.  Tortuous aorta.  Clear lungs with bilateral nipple shadows.  The lungs are mildly hyperexpanded with mildly prominent interstitial markings.  Minimal lower thoracic spine degenerative changes.  IMPRESSION:  1.  No acute abnormality. 2.  Mild changes of COPD.   Original Report Authenticated By: Beckie Salts, M.D.    ASSESSMENT AND PLAN:  1. Patient  presented with marked dyspnea, orthopnea, symptoms suggestive of class III congestive heart failure. 2. Chronic leg edema cannot exclude right heart failure. 3. Elevated CPK, suggest mild rhabdomyolysis with very mild leak in cardiac markers/cardiac injury in the form of elevated serum troponin, suggest NSTEMI versus CHF. Recommendation: I would recommend proceeding with left and right heart catheterization to evaluate his coronary anatomy and also for pulmonary hypertension. I did review his echocardiogram and this shows severe global hypokinesis with LV dilatation with ejection fraction of 10-20%. Make further condition after cardiac catheterization. I have discussed with the patient regarding the risks, benefits and alternatives to cardiac catheterization including but not limited to less than 1% risk of that, stroke, MI, need for urgent CABG but not limited to this. Patient is willing.  Pamella Pert, MD 03/11/2013, 7:17 AM [facility]  Pager: [phone] Office: [phone] If no answer Cell [phone]

## 2013-03-11 NOTE — Progress Notes (Signed)
Echo Lab  2D Echocardiogram completed.  Evans Levee L Trase Bunda, RDCS 03/11/2013 11:31 AM

## 2013-03-11 NOTE — Progress Notes (Signed)
Bedrest complete. VSS. Right groin dry/intact with no bleeding or hematoma palpable. Patient eating dinner. Call bell near.Peter [name]

## 2013-03-12 ENCOUNTER — Encounter (HOSPITAL_COMMUNITY): Payer: Self-pay | Admitting: Cardiology

## 2013-03-12 LAB — PRO B NATRIURETIC PEPTIDE: Pro B Natriuretic peptide (BNP): 475.2 pg/mL — ABNORMAL HIGH (ref 0–125)

## 2013-03-12 LAB — BASIC METABOLIC PANEL
Calcium: 9.2 mg/dL (ref 8.4–10.5)
Creatinine, Ser: 1.31 mg/dL (ref 0.50–1.35)
GFR calc Af Amer: 66 mL/min — ABNORMAL LOW (ref >90)
GFR calc non Af Amer: 57 mL/min — ABNORMAL LOW (ref >90)

## 2013-03-12 LAB — JO-1 ANTIBODY-IGG: Jo-1 Antibody, IgG: 1 AU/mL (ref <30)

## 2013-03-12 LAB — ANA: Anti Nuclear Antibody(ANA): NEGATIVE

## 2013-03-12 MED ORDER — DIGOXIN 250 MCG PO TABS: 0.2500 mg | ORAL_TABLET | Freq: Every day | ORAL | Status: DC

## 2013-03-12 MED ORDER — ISOSORB DINITRATE-HYDRALAZINE 20-37.5 MG PO TABS: 1.0000 | ORAL_TABLET | Freq: Three times a day (TID) | ORAL | Status: DC

## 2013-03-12 MED ORDER — CARVEDILOL 3.125 MG PO TABS: 3.1250 mg | ORAL_TABLET | Freq: Two times a day (BID) | ORAL | Status: DC

## 2013-03-12 NOTE — Progress Notes (Signed)
CARDIAC REHAB PHASE I   PRE:  Rate/Rhythm: 89 SR    BP: sitting 106/80    SaO2:   MODE:  Ambulation: 550 ft   POST:  Rate/Rhythm: 107 ST    BP: sitting 120/84     SaO2:   Tolerated well. No major SOB, sts he feels good, much better. Ed completed and pt is interested in CRPII at [facility]. Will send referral. Gave walking gl to increase his ex tol. Gave HF booklet and reviewed material. Will tell pt about HF video. Seemingly good reception. [id number]   Elissa Lovett Bessemer CES, ACSM 03/12/2013 11:11 AM

## 2013-03-12 NOTE — Progress Notes (Signed)
UR COMPLETED  

## 2013-03-12 NOTE — Discharge Summary (Signed)
Physician Discharge Summary  Patient ID: Peter [name] MRN: [id number] DOB/AGE: April 08, 1951 62 y.o.  Admit date: 03/10/2013 Discharge date: 03/12/2013  Primary Discharge Diagnosis 1.  Nonischemic dilated cardiomyopathy with severe LV systolic dysfunction.  Ejection fraction 10-20%. Cannot exclude viral cardiomyopathy with symptoms ongoing for the past 2-3 months but worse in the last one to 2 weeks. 2.  Acute on chronic systolic and diastolic heart failure. Secondary Discharge Diagnosis 3.  Hypertension 4.  Chronic leg edema 5. NSTEMI secondary leak in cardiac troponins.  Significant Diagnostic Studies: Echocardiogram 03/11/2013:  LV Wall thickness was increased in a pattern of moderate LVH. Systolic function was normal. The estimated ejection fraction was severely decreased with LVEF of 15%. Diffuse hypokinesis. LV mildly dilated.  Cardiac catheterization 03/11/2013: No significant CAD. Very minimal luminal irregularity, severe LV systolic dysfunction, EF 10-20% with global hypokinesis, findings suggestive of dilated nonischemic or adenopathy.  Right heart pressures were within normal limits with low normal preserved cardiac output and cardiac index.   Hospital Course: Patient presented with marked fatigue, worsening shortness of breath and dyspnea on exertion and orthopnea.  He was admitted to the hospital with suspicion for CHF and cardiomyopathy.  His BNP was elevated and serum troponin was also elevated with moderate CPK enzyme leak with cardiac index being negative.  He underwent echocardiogram of a he had undergone dialysis which had revealed severe LV systolic dysfunction with ejection fraction of 10-15%.  I saw the patient along with Dr. Creola Corn and felt it was prudent to proceed with right and left heart catheterization. Patient has been having symptoms of dyspnea for the past 2-3 months.  Worse over the last one to 2 weeks.  No chest pain.  No palpitations or syncope. The  following day after cardiac catheterization, patient was doing well  and his symptoms of dyspnea had improved.  He was not in any acute florid heart failure and was felt safe for discharge however the patient has been advised to be off of work for at least 3-4 weeks until further determination.   Discharge Exam: Blood pressure 144/89, pulse 97, temperature 98.5 F (36.9 C), temperature source Oral, resp. rate 18, height 6\' 2"  (1.88 m), weight 106.2 kg (234 lb 2.1 oz), SpO2 98.00%.   General appearance: alert, cooperative, appears stated age, no distress and mildly obese  Lungs: clear to auscultation bilaterally  Heart: no S3 or S4, no click and no rub  Abdomen: soft, non-tender; bowel sounds normal; no masses, no organomegaly  Extremities: edema trace bilateral below knee pitting and non tender. Right slightly worse than the left. Pulses: 2+ and symmetric  Neurologic: Grossly normal Right femoral access site without hematoma. Labs:   Lab Results  Component Value Date   WBC 7.3 03/11/2013   HGB 13.4 03/11/2013   HCT 39.4 03/11/2013   MCV 84.2 03/11/2013   PLT 262 03/11/2013    Recent Labs Lab 03/11/13 0400 03/12/13 0800  NA 141 136  K 4.1 3.2*  CL 103 99  CO2 29 28  BUN 15 18  CREATININE 1.31 1.31  CALCIUM 9.8 9.2  PROT 7.2  --   BILITOT 0.5  --   ALKPHOS 61  --   ALT 55*  --   AST 53*  --   GLUCOSE 89 117*   Lab Results  Component Value Date   CKTOTAL 2265* 03/11/2013   CKMB 26.2* 03/11/2013   TROPONINI <0.30 03/11/2013   Cardiac Panel (last 3 results)  Recent Labs  03/10/13  2330 03/11/13 0400 03/11/13 1420 03/11/13 1729  CKTOTAL 2559* 2265*  --   --   CKMB 28.7* 26.2*  --   --   TROPONINI  --  <0.30 <0.30 <0.30  RELINDX 1.1 1.2  --   --    BNP (last 3 results)  Recent Labs  03/10/13 1730 03/12/13 0800  PROBNP 993.9* 475.2*     EKG: 03/10/2013: Normal sinus rhythm, left hypertrophy with repolarization abnormality. Poor R wave progression cannot exclude  old anterior infarct.   Radiology: Dg Chest 2 View  03/10/2013   *RADIOLOGY REPORT*  Clinical Data: Chest pain and shortness of breath.  Fatigue.  Ex- smoker.  CHEST - 2 VIEW  Comparison: None.  Findings: Normal sized heart.  Tortuous aorta.  Clear lungs with bilateral nipple shadows.  The lungs are mildly hyperexpanded with mildly prominent interstitial markings.  Minimal lower thoracic spine degenerative changes.  IMPRESSION:  1.  No acute abnormality. 2.  Mild changes of COPD.   Original Report Authenticated By: Beckie Salts, M.D.      FOLLOW UP PLANS AND APPOINTMENTS    Medication List    STOP taking these medications       SUDAFED PO      TAKE these medications       acetaminophen 500 MG tablet  Commonly known as:  TYLENOL  Take 1,500 mg by mouth 5 (five) times daily as needed for pain.     albuterol 108 (90 BASE) MCG/ACT inhaler  Commonly known as:  PROVENTIL HFA;VENTOLIN HFA  Inhale 2 puffs into the lungs every 6 (six) hours as needed for wheezing.     carvedilol 3.125 MG tablet  Commonly known as:  COREG  Take 1 tablet (3.125 mg total) by mouth 2 (two) times daily with a meal.     digoxin 0.25 MG tablet  Commonly known as:  LANOXIN  Take 1 tablet (0.25 mg total) by mouth daily.  Start taking on:  03/13/2013     FISH OIL PO  Take 1 capsule by mouth daily.     isosorbide-hydrALAZINE 20-37.5 MG per tablet  Commonly known as:  BIDIL  Take 1 tablet by mouth 3 (three) times daily.     levothyroxine 175 MCG tablet  Commonly known as:  SYNTHROID, LEVOTHROID  Take 175 mcg by mouth daily before breakfast.     multivitamin with minerals Tabs  Take 1 tablet by mouth daily.     olmesartan-hydrochlorothiazide 20-12.5 MG per tablet  Commonly known as:  BENICAR HCT  Take 1 tablet by mouth daily.     vitamin B-12 100 MCG tablet  Commonly known as:  CYANOCOBALAMIN  Take 50 mcg by mouth daily.           Follow-up Information   Follow up with [name], MD.  (Tomorrow at 11:00)    Contact information:   [address] [facility] [place] [address] [phone]       Follow up with Pamella Pert, MD On 03/21/2013. (4 pm appointment. Bring all medications)    Contact information:   [address] [place] [address] [phone]        Pamella Pert, MD 03/12/2013, 8:32 PM  Pager: [phone] Office: [phone] If no answer: [phone]

## 2013-03-12 NOTE — Progress Notes (Signed)
Subjective: Appreciate Dr Verl Dicker care of this pt. No current SOB and Edema is gone. Lots of ?s answered. 30 min spent  Objective: Vital signs in last 24 hours: Temp:  [97.4 F (36.3 C)-98.4 F (36.9 C)] 98.4 F (36.9 C) (06/18 0500) Pulse Rate:  [85-98] 96 (06/18 0500) Resp:  [16-18] 18 (06/18 0500) BP: (90-137)/(57-96) 118/67 mmHg (06/18 0500) SpO2:  [95 %-100 %] 96 % (06/18 0500) Weight:  [106.2 kg (234 lb 2.1 oz)] 106.2 kg (234 lb 2.1 oz) (06/18 0500) Weight change: 0.8 kg (1 lb 12.2 oz) Last BM Date: 03/04/13  CBG (last 3)  No results found for this basename: GLUCAP,  in the last 72 hours  Intake/Output from previous day:  Intake/Output Summary (Last 24 hours) at 03/12/13 0753 Last data filed at 03/11/13 1100  Gross per 24 hour  Intake    480 ml  Output    300 ml  Net    180 ml   06/17 0701 - 06/18 0700 In: 480 [P.O.:480] Out: 300 [Urine:300]   Physical Exam General appearance: Looks good and comfortable. NAD  Head: Normocephalic, without obvious abnormality, atraumatic  Neck: no adenopathy, no carotid bruit, no JVD and thyroid not enlarged, symmetric, no tenderness/mass/nodules  Resp: CTA  Cardio: Reg c SEM  GI: soft, non-tender; bowel sounds normal; no masses, no organomegaly  Extremities: extremities normal, atraumatic, no cyanosis or edema.  Edema is gone  Pulses: 2+ and symmetric  Neurologic: Alert and oriented X 3, normal strength and tone. Normal symmetric reflexes.     Lab Results:  Recent Labs  03/10/13 1443 03/11/13 0400  NA 139 141  K 3.7 4.1  CL 102 103  CO2 25 29  GLUCOSE 95 89  BUN 14 15  CREATININE 1.29 1.31  CALCIUM 9.4 9.8     Recent Labs  03/11/13 0400  AST 53*  ALT 55*  ALKPHOS 61  BILITOT 0.5  PROT 7.2  ALBUMIN 4.2     Recent Labs  03/10/13 1443 03/11/13 0400  WBC 4.2 7.3  NEUTROABS 1.8  --   HGB 13.2 13.4  HCT 38.3* 39.4  MCV 84.2 84.2  PLT 260 262    Lab Results  Component Value Date   INR 1.00  03/11/2013     Recent Labs  03/10/13 2330 03/11/13 0400 03/11/13 1420 03/11/13 1729  CKTOTAL 2559* 2265*  --   --   CKMB 28.7* 26.2*  --   --   TROPONINI  --  <0.30 <0.30 <0.30     Recent Labs  03/10/13 1730  TSH 2.554     Recent Labs  03/11/13 1420  FERRITIN 130  TIBC 293  IRON 63    Micro Results: No results found for this or any previous visit (from the past 240 hour(s)).   Studies/Results: Dg Chest 2 View  03/10/2013   *RADIOLOGY REPORT*  Clinical Data: Chest pain and shortness of breath.  Fatigue.  Ex- smoker.  CHEST - 2 VIEW  Comparison: None.  Findings: Normal sized heart.  Tortuous aorta.  Clear lungs with bilateral nipple shadows.  The lungs are mildly hyperexpanded with mildly prominent interstitial markings.  Minimal lower thoracic spine degenerative changes.  IMPRESSION:  1.  No acute abnormality. 2.  Mild changes of COPD.   Original Report Authenticated By: Beckie Salts, M.D.     Medications: Scheduled: . aspirin EC  325 mg Oral Daily  . carvedilol  3.125 mg Oral BID WC  . digoxin  0.25 mg  Intravenous Daily  . enoxaparin (LOVENOX) injection  40 mg Subcutaneous Q24H  . furosemide  40 mg Intravenous Daily  . irbesartan  150 mg Oral Daily  . isosorbide-hydrALAZINE  1 tablet Oral BID  . levothyroxine  175 mcg Oral QAC breakfast  . multivitamin with minerals  1 tablet Oral Daily  . omega-3 acid ethyl esters  1 g Oral BID  . sodium chloride  3 mL Intravenous Q12H  . vitamin B-12  50 mcg Oral Daily   Continuous: . sodium chloride 300 mL (03/11/13 1329)     Assessment/Plan: Principal Problem:   Acute combined systolic and diastolic heart failure Active Problems:   Volume overload   HTN (hypertension)   Abnormal CK   DOE (dyspnea on exertion)   Unspecified hypothyroidism   CHF exacerbation Acute on chronic Systolic CHF c EKG changes and abnormal CK/Trop I. CXR c/w 1. No acute abnormality. 2. Mild changes of COPD. Current thyroid #s and DDimer  are fine.  ECHO showed diffuse HK c EF 15%. CATH Showed Normal Coronaries and Dilated CM c EF 20% and the presumption is he developed a Viral Myocarditis thus explaining the elevated CK levels. Goal now is to tune up medically and Aggressive CHF management and monitor heart function. Diurese is in process. Follow Cr. Cr 1.31 yesterday - re-check today. May need AICD? I will have Melissa call him tomorrow and get him back in 1-2 weeks with follow up labs and pull the Pending labs off EPIC.  Abnormal CK compatible c Viral Myocarditis story not c/w Rhabdo.  I sent labs to R/o ? Myositis (Unlikely) = ESR is 5 and normal/ANA/Anti Jo Ferritin/TIBC all normal, and a SPEP. He is not on a statin.   Home Meds.   Asthma/COPD - OK for appropriate use of Avair/Albuterol  - Stop PSE and dose  Albuterol use correctly discussed..   Lovenox for DVT Proph  I have held BB with his long standing Asthma as well.   HTN - BP Now perfect. Hypothyroid - #s fine.   Abnormal LFTs - Mild - ? Passive congestion/Fatty Liver etc. Will work up later as outpt if neded     LOS: 2 days   Leiah Giannotti M 03/12/2013, 7:53 AM

## 2013-03-13 LAB — PROTEIN ELECTROPHORESIS, SERUM
Albumin ELP: 59.3 % (ref 55.8–66.1)
Alpha-1-Globulin: 4.6 % (ref 2.9–4.9)
Alpha-2-Globulin: 11.7 % (ref 7.1–11.8)
Total Protein ELP: 6.7 g/dL (ref 6.0–8.3)

## 2013-03-20 ENCOUNTER — Encounter: Payer: Self-pay | Admitting: Gastroenterology

## 2013-04-03 ENCOUNTER — Encounter (HOSPITAL_COMMUNITY)
Admission: RE | Admit: 2013-04-03 | Discharge: 2013-04-03 | Disposition: A | Discharge status: Home / Self Care | Payer: 59 | Source: Ambulatory Visit | Attending: Cardiology | Admitting: Cardiology

## 2013-04-03 DIAGNOSIS — J4489 Other specified chronic obstructive pulmonary disease: Secondary | ICD-10-CM | POA: Insufficient documentation

## 2013-04-03 DIAGNOSIS — I509 Heart failure, unspecified: Secondary | ICD-10-CM | POA: Insufficient documentation

## 2013-04-03 DIAGNOSIS — I5042 Chronic combined systolic (congestive) and diastolic (congestive) heart failure: Secondary | ICD-10-CM | POA: Insufficient documentation

## 2013-04-03 DIAGNOSIS — I10 Essential (primary) hypertension: Secondary | ICD-10-CM | POA: Insufficient documentation

## 2013-04-03 DIAGNOSIS — Z5189 Encounter for other specified aftercare: Secondary | ICD-10-CM | POA: Insufficient documentation

## 2013-04-03 DIAGNOSIS — J449 Chronic obstructive pulmonary disease, unspecified: Secondary | ICD-10-CM | POA: Insufficient documentation

## 2013-04-03 DIAGNOSIS — I214 Non-ST elevation (NSTEMI) myocardial infarction: Secondary | ICD-10-CM | POA: Insufficient documentation

## 2013-04-03 NOTE — Progress Notes (Signed)
Cardiac Rehab Medication Review by a Pharmacist  Does the patient  feel that his/her medications are working for him/her?  yes  Has the patient been experiencing any side effects to the medications prescribed?  no  Does the patient measure his/her own blood pressure or blood glucose at home?  no   Does the patient have any problems obtaining medications due to transportation or finances?   yes  Understanding of regimen: excellent Understanding of indications: excellent Potential of compliance: excellent   Dolphus Jenny 04/03/2013 9:11 AM

## 2013-04-07 ENCOUNTER — Encounter (HOSPITAL_COMMUNITY)
Admission: RE | Admit: 2013-04-07 | Discharge: 2013-04-07 | Disposition: A | Discharge status: Home / Self Care | Payer: 59 | Source: Ambulatory Visit | Attending: Cardiology | Admitting: Cardiology

## 2013-04-07 ENCOUNTER — Ambulatory Visit (HOSPITAL_COMMUNITY): Payer: 59

## 2013-04-08 NOTE — Progress Notes (Addendum)
Pt started cardiac rehab yesterday.  Pt tolerated light exercise without difficulty. Telemetry Sinus Rhythm, vital signs stable.Will continue to monitor the patient throughout  the program. Dierdre Searles does have a right leg limp which was present before his recent hospitalization.

## 2013-04-09 ENCOUNTER — Encounter (HOSPITAL_COMMUNITY)
Admission: RE | Admit: 2013-04-09 | Discharge: 2013-04-09 | Disposition: A | Discharge status: Home / Self Care | Payer: 59 | Source: Ambulatory Visit | Attending: Cardiology | Admitting: Cardiology

## 2013-04-11 ENCOUNTER — Encounter (HOSPITAL_COMMUNITY)
Admission: RE | Admit: 2013-04-11 | Discharge: 2013-04-11 | Disposition: A | Discharge status: Home / Self Care | Payer: 59 | Source: Ambulatory Visit | Attending: Cardiology | Admitting: Cardiology

## 2013-04-14 ENCOUNTER — Encounter (HOSPITAL_COMMUNITY)
Admission: RE | Admit: 2013-04-14 | Discharge: 2013-04-14 | Disposition: A | Discharge status: Home / Self Care | Payer: 59 | Source: Ambulatory Visit | Attending: Cardiology | Admitting: Cardiology

## 2013-04-14 ENCOUNTER — Encounter (INDEPENDENT_AMBULATORY_CARE_PROVIDER_SITE_OTHER): Payer: 59 | Admitting: *Deleted

## 2013-04-14 DIAGNOSIS — IMO0001 Reserved for inherently not codable concepts without codable children: Secondary | ICD-10-CM

## 2013-04-14 NOTE — Progress Notes (Signed)
Reviewed home exercise with pt today.  Pt plans to walk outside when temperature allows for exercise.  Reviewed THR, pulse, RPE, sign and symptoms, NTG use, and when to call 911 or MD.  Pt voiced understanding.  Laray Anger Academic Intern Fabio Pierce, MA, ACSM RCEP

## 2013-04-16 ENCOUNTER — Encounter (HOSPITAL_COMMUNITY)
Admission: RE | Admit: 2013-04-16 | Discharge: 2013-04-16 | Disposition: A | Discharge status: Home / Self Care | Payer: 59 | Source: Ambulatory Visit | Attending: Cardiology | Admitting: Cardiology

## 2013-04-18 ENCOUNTER — Encounter (HOSPITAL_COMMUNITY)
Admission: RE | Admit: 2013-04-18 | Discharge: 2013-04-18 | Disposition: A | Discharge status: Home / Self Care | Payer: 59 | Source: Ambulatory Visit | Attending: Cardiology | Admitting: Cardiology

## 2013-04-21 ENCOUNTER — Encounter (HOSPITAL_COMMUNITY)
Admission: RE | Admit: 2013-04-21 | Discharge: 2013-04-21 | Disposition: A | Discharge status: Home / Self Care | Payer: 59 | Source: Ambulatory Visit | Attending: Cardiology | Admitting: Cardiology

## 2013-04-23 ENCOUNTER — Encounter (HOSPITAL_COMMUNITY)
Admission: RE | Admit: 2013-04-23 | Discharge: 2013-04-23 | Disposition: A | Discharge status: Home / Self Care | Payer: 59 | Source: Ambulatory Visit | Attending: Cardiology | Admitting: Cardiology

## 2013-04-25 ENCOUNTER — Encounter (HOSPITAL_COMMUNITY)
Admission: RE | Admit: 2013-04-25 | Discharge: 2013-04-25 | Disposition: A | Discharge status: Home / Self Care | Payer: 59 | Source: Ambulatory Visit | Attending: Cardiology | Admitting: Cardiology

## 2013-04-25 DIAGNOSIS — J449 Chronic obstructive pulmonary disease, unspecified: Secondary | ICD-10-CM | POA: Insufficient documentation

## 2013-04-25 DIAGNOSIS — I509 Heart failure, unspecified: Secondary | ICD-10-CM | POA: Insufficient documentation

## 2013-04-25 DIAGNOSIS — J4489 Other specified chronic obstructive pulmonary disease: Secondary | ICD-10-CM | POA: Insufficient documentation

## 2013-04-25 DIAGNOSIS — I214 Non-ST elevation (NSTEMI) myocardial infarction: Secondary | ICD-10-CM | POA: Insufficient documentation

## 2013-04-25 DIAGNOSIS — I5042 Chronic combined systolic (congestive) and diastolic (congestive) heart failure: Secondary | ICD-10-CM | POA: Insufficient documentation

## 2013-04-25 DIAGNOSIS — Z5189 Encounter for other specified aftercare: Secondary | ICD-10-CM | POA: Insufficient documentation

## 2013-04-25 DIAGNOSIS — I10 Essential (primary) hypertension: Secondary | ICD-10-CM | POA: Insufficient documentation

## 2013-04-28 ENCOUNTER — Encounter (HOSPITAL_COMMUNITY)
Admission: RE | Admit: 2013-04-28 | Discharge: 2013-04-28 | Disposition: A | Discharge status: Home / Self Care | Payer: 59 | Source: Ambulatory Visit | Attending: Cardiology | Admitting: Cardiology

## 2013-04-30 ENCOUNTER — Encounter (HOSPITAL_COMMUNITY)
Admission: RE | Admit: 2013-04-30 | Discharge: 2013-04-30 | Disposition: A | Discharge status: Home / Self Care | Payer: 59 | Source: Ambulatory Visit | Attending: Cardiology | Admitting: Cardiology

## 2013-05-02 ENCOUNTER — Encounter (HOSPITAL_COMMUNITY)
Admission: RE | Admit: 2013-05-02 | Discharge: 2013-05-02 | Disposition: A | Discharge status: Home / Self Care | Payer: 59 | Source: Ambulatory Visit | Attending: Cardiology | Admitting: Cardiology

## 2013-05-02 NOTE — Progress Notes (Signed)
Shuayb Italiano 62 y.o. male Nutrition Note Spoke with pt.  Nutrition Plan and Nutrition Survey goals reviewed with pt. Pt is following Step 1 of the Therapeutic Lifestyle Changes diet. Pt wants to lose wt. Pt has been trying to lose wt by "being more aware of portion sizes." Wt loss tips reviewed. Barriers to wt loss discussed and include pt's difficulty with mobility due to "knee issues." Pt has an appt with a Rheumatologist to discuss knee pain "soon." Pt has CHF. Pt states he is using Mrs. Dash primarily to season food. Pt is limiting sodium from dietary sources by choosing more fresh/frozen vegetables and less processed food. Per nutrition screen, pt c/o constipation. Per discussion, pt feels constipation is well managed at this time. Pt states he "has to watch water intake according to his MD due to CHF."  Pt expressed understanding of the information reviewed. Pt aware of nutrition education classes offered and plans on attending nutrition classes.  Nutrition Diagnosis   Food-and nutrition-related knowledge deficit related to lack of exposure to information as related to diagnosis of: ? CVD    Obesity related to excessive energy intake as evidenced by a BMI of 31.7  Nutrition RX/ Estimated Daily Nutrition Needs for: wt loss  1700-2200 Kcal, 45-60 gm fat, 11-15 gm sat fat, 1.7-2.2 gm trans-fat, <1500 mg sodium   Nutrition Intervention   Pt's individual nutrition plan reviewed with pt.   Benefits of adopting Therapeutic Lifestyle Changes discussed when Medficts reviewed.   Pt to attend the Portion Distortion class - met 04/23/13   Pt to attend the  ? Nutrition I class                     ? Nutrition II class - met 04/22/13    Pt given handouts for: ? 1800-kcal, 5-day menu ideas   Continue client-centered nutrition education by RD, as part of interdisciplinary care. Goal(s)   Pt to identify and limit food sources of saturated fat, trans fat, and cholesterol   Pt to identify food quantities  necessary to achieve: ? wt loss to a goal wt of 216-234 lb (98.1-106.3 kg) at graduation from cardiac rehab.  Monitor and Evaluate progress toward nutrition goal with team. Nutrition Risk: Change to Moderate  Mickle Plumb, M.Ed, RD, LDN, CDE 05/02/2013 1:54 PM

## 2013-05-05 ENCOUNTER — Encounter (HOSPITAL_COMMUNITY)
Admission: RE | Admit: 2013-05-05 | Discharge: 2013-05-05 | Disposition: A | Discharge status: Home / Self Care | Payer: 59 | Source: Ambulatory Visit | Attending: Cardiology | Admitting: Cardiology

## 2013-05-07 ENCOUNTER — Encounter (HOSPITAL_COMMUNITY)
Admission: RE | Admit: 2013-05-07 | Discharge: 2013-05-07 | Disposition: A | Discharge status: Home / Self Care | Payer: 59 | Source: Ambulatory Visit | Attending: Cardiology | Admitting: Cardiology

## 2013-05-09 ENCOUNTER — Encounter (HOSPITAL_COMMUNITY)
Admission: RE | Admit: 2013-05-09 | Discharge: 2013-05-09 | Disposition: A | Discharge status: Home / Self Care | Payer: 59 | Source: Ambulatory Visit | Attending: Cardiology | Admitting: Cardiology

## 2013-05-12 ENCOUNTER — Encounter (HOSPITAL_COMMUNITY): Payer: 59

## 2013-05-14 ENCOUNTER — Encounter (HOSPITAL_COMMUNITY): Payer: 59

## 2013-05-16 ENCOUNTER — Encounter (HOSPITAL_COMMUNITY): Payer: 59

## 2013-05-19 ENCOUNTER — Encounter (HOSPITAL_COMMUNITY)
Admission: RE | Admit: 2013-05-19 | Discharge: 2013-05-19 | Disposition: A | Discharge status: Home / Self Care | Payer: 59 | Source: Ambulatory Visit | Attending: Cardiology | Admitting: Cardiology

## 2013-05-21 ENCOUNTER — Encounter (HOSPITAL_COMMUNITY)
Admission: RE | Admit: 2013-05-21 | Discharge: 2013-05-21 | Disposition: A | Discharge status: Home / Self Care | Payer: 59 | Source: Ambulatory Visit | Attending: Cardiology | Admitting: Cardiology

## 2013-05-23 ENCOUNTER — Encounter (HOSPITAL_COMMUNITY)
Admission: RE | Admit: 2013-05-23 | Discharge: 2013-05-23 | Disposition: A | Discharge status: Home / Self Care | Payer: 59 | Source: Ambulatory Visit | Attending: Cardiology | Admitting: Cardiology

## 2013-05-28 ENCOUNTER — Encounter (HOSPITAL_COMMUNITY)
Admission: RE | Admit: 2013-05-28 | Discharge: 2013-05-28 | Disposition: A | Discharge status: Home / Self Care | Payer: 59 | Source: Ambulatory Visit | Attending: Cardiology | Admitting: Cardiology

## 2013-05-28 DIAGNOSIS — J4489 Other specified chronic obstructive pulmonary disease: Secondary | ICD-10-CM | POA: Insufficient documentation

## 2013-05-28 DIAGNOSIS — I5042 Chronic combined systolic (congestive) and diastolic (congestive) heart failure: Secondary | ICD-10-CM | POA: Insufficient documentation

## 2013-05-28 DIAGNOSIS — I509 Heart failure, unspecified: Secondary | ICD-10-CM | POA: Insufficient documentation

## 2013-05-28 DIAGNOSIS — Z5189 Encounter for other specified aftercare: Secondary | ICD-10-CM | POA: Insufficient documentation

## 2013-05-28 DIAGNOSIS — I214 Non-ST elevation (NSTEMI) myocardial infarction: Secondary | ICD-10-CM | POA: Insufficient documentation

## 2013-05-28 DIAGNOSIS — J449 Chronic obstructive pulmonary disease, unspecified: Secondary | ICD-10-CM | POA: Insufficient documentation

## 2013-05-28 DIAGNOSIS — I10 Essential (primary) hypertension: Secondary | ICD-10-CM | POA: Insufficient documentation

## 2013-05-30 ENCOUNTER — Encounter (HOSPITAL_COMMUNITY): Payer: 59

## 2013-06-02 ENCOUNTER — Encounter (HOSPITAL_COMMUNITY)
Admission: RE | Admit: 2013-06-02 | Discharge: 2013-06-02 | Disposition: A | Discharge status: Home / Self Care | Payer: 59 | Source: Ambulatory Visit | Attending: Cardiology | Admitting: Cardiology

## 2013-06-03 ENCOUNTER — Ambulatory Visit (INDEPENDENT_AMBULATORY_CARE_PROVIDER_SITE_OTHER): Payer: 59 | Admitting: Neurology

## 2013-06-03 ENCOUNTER — Ambulatory Visit (INDEPENDENT_AMBULATORY_CARE_PROVIDER_SITE_OTHER): Payer: Self-pay

## 2013-06-03 DIAGNOSIS — I10 Essential (primary) hypertension: Secondary | ICD-10-CM

## 2013-06-03 DIAGNOSIS — R531 Weakness: Secondary | ICD-10-CM | POA: Insufficient documentation

## 2013-06-03 DIAGNOSIS — E8779 Other fluid overload: Secondary | ICD-10-CM

## 2013-06-03 DIAGNOSIS — G63 Polyneuropathy in diseases classified elsewhere: Secondary | ICD-10-CM

## 2013-06-03 DIAGNOSIS — R748 Abnormal levels of other serum enzymes: Secondary | ICD-10-CM

## 2013-06-03 DIAGNOSIS — I5041 Acute combined systolic (congestive) and diastolic (congestive) heart failure: Secondary | ICD-10-CM

## 2013-06-03 DIAGNOSIS — E8770 Fluid overload, unspecified: Secondary | ICD-10-CM

## 2013-06-03 DIAGNOSIS — Z0289 Encounter for other administrative examinations: Secondary | ICD-10-CM

## 2013-06-03 NOTE — Procedures (Signed)
    [facility]  NCS (NERVE CONDUCTION STUDY) WITH EMG (ELECTROMYOGRAPHY) REPORT   STUDY DATE: 06/03/2013 PATIENT NAME: Peter [name] DOB: 07/10/1951 MRN: [id number]    TECHNOLOGIST: Gearldine Shown ELECTROMYOGRAPHER: Levert Feinstein M.D.  CLINICAL INFORMATION:  62 years old right-handed African American male, with past medical history of Graves' disease, admission in June 2014 for bilateral lower extremity swelling, shortness of breath, ejection fraction was 45-50%.  On examination: He has moderate right hand intrinsic muscle atrophy, shoulder abduction (R/L) 4/5, external rotation 4/5, elbow flexion 4/4 plus, elbow extension 4/4 plus, wrist flexion  4/5, Grip 4-/5, finger abduction 3/5, hip flexion 4/5, knee flexion 5-/5 knee extension 5/5 ankle dorsiflexion 4+/5. he has no sensory loss.  deep tendon reflexes brachial radialis 1/1 biceps 1/1, triceps 1/1, patellar 3/3, ankle 2/2 plantar responses were flexor bilaterally, ,    FINDINGS: NERVE CONDUCTION STUDY: Bilateral peroneal sensory responses were absent.  Bilateral tibial, peroneal to EDB have showed moderately to severely decreased  CMAP amplitude, with normal distal latency, low normal conduction velocity.  Bilateral tibial H. reflexes were absent.  Right ulnar sensory and motor responses were normal. Right median sensory response showed mild to moderately prolonged peak latency, with normal SNAP amplitude.  Right median motor responses showed mildly prolonged distal latency, severely decreased C. map amplitude, normal conduction velocity. Right median and ulnar F wave latency was mildly prolonged, this could be due to his big body habitus.      NEEDLE ELECTROMYOGRAPHY: Selected needle examination was performed that bilateral upper, lower extremity muscles, and right lumbar, thoracic paraspinal muscles   Right tibialis anterior: Increased insertion activity, 2 plus spontaneous activity, complex enlarged motor unit  potential with decreased recruitment patterns.  Right medial gastrocnemius, left tibialis anterior, right biceps femoris long head, right vastus lateralis, increased insertion activity, no spontaneous activity, complex enlarged motor unit potential with decreased recruitment patterns.  There was no spontaneous activity at right lumbosacral paraspinal muscles, right L5, L4, S1,  There was no spontaneous activity at right thoracic paraspinal muscles, right T10, T11.  Right first dorsal interossei: Increased insertion activity, 2 plus spontaneous activity, complex enlarged motor unit potential with decreased recruitment patterns.  Right extensor digitorum communis, increased insertion activity 1 plus spontaneous activity complex enlarged motor unit potential with decreased recruitment patterns. Right pronator teres: increased insertion activity, CRDs,, mixture of normal and some enlarged motor unit potential with decreased recruitment patterns,  Right biceps: Increased insertion activity,  Mixture of normal, some enlarged motor unit potential, with mildly decreased recruitment patterns    Left extensor digitorum communis, increasing insertion activity, no spontaneous activity, complex enlarged motor unit potential with decreased recruitment patterns  IMPESSION:   This is a marked abnormal study. There is electrodiagnostic evidence of active neuropathic changes involving bilateral cervical, and lumbosacral myotomes. In addition, there is evidence of length dependent mild axonal peripheral neuropathy, moderate right carpal tunnel syndrome.  Differentiation diagnosis of above findings include motor neuron disease, paraneoplastic syndrome, nutritional deficiency, infectious, inflammatory etiology.  Extensive evaluation has been initiated, this including MRI of neuraxis, laboratory evaluation, he will return to clinic in one month.       INTERPRETING PHYSICIAN:   Levert Feinstein M.D. Ph.D. [facility] [address], [address] [place], [place] [address] [phone]

## 2013-06-04 ENCOUNTER — Encounter (HOSPITAL_COMMUNITY)
Admission: RE | Admit: 2013-06-04 | Discharge: 2013-06-04 | Disposition: A | Discharge status: Home / Self Care | Payer: 59 | Source: Ambulatory Visit | Attending: Cardiology | Admitting: Cardiology

## 2013-06-05 LAB — HEPATITIS PANEL, ACUTE
Hep A IgM: NEGATIVE
Hep B C IgM: NEGATIVE
Hepatitis B Surface Ag: NEGATIVE

## 2013-06-05 LAB — ANA W/REFLEX IF POSITIVE: Anti Nuclear Antibody(ANA): NEGATIVE

## 2013-06-05 LAB — IFE AND PE, SERUM
Alpha 1: 0.3 g/dL (ref 0.1–0.4)
Alpha2 Glob SerPl Elph-Mcnc: 0.7 g/dL (ref 0.4–1.2)
B-Globulin SerPl Elph-Mcnc: 0.9 g/dL (ref 0.6–1.3)
Gamma Glob SerPl Elph-Mcnc: 1 g/dL (ref 0.5–1.6)

## 2013-06-05 LAB — VITAMIN B12: Vitamin B-12: >1999 pg/mL — ABNORMAL HIGH (ref 211–946)

## 2013-06-05 LAB — LYME, TOTAL AB TEST/REFLEX: Lyme IgG/IgM Ab: <0.91 {ISR} (ref 0.00–0.90)

## 2013-06-05 LAB — HIV ANTIBODY (ROUTINE TESTING W REFLEX)
HIV 1/O/2 Abs-Index Value: <1 (ref <1.00)
HIV-1/HIV-2 Ab: NONREACTIVE

## 2013-06-05 LAB — FOLATE: Folate: >19.9 ng/mL (ref >3.0)

## 2013-06-06 ENCOUNTER — Encounter (HOSPITAL_COMMUNITY)
Admission: RE | Admit: 2013-06-06 | Discharge: 2013-06-06 | Disposition: A | Discharge status: Home / Self Care | Payer: 59 | Source: Ambulatory Visit | Attending: Cardiology | Admitting: Cardiology

## 2013-06-09 ENCOUNTER — Encounter (HOSPITAL_COMMUNITY)
Admission: RE | Admit: 2013-06-09 | Discharge: 2013-06-09 | Disposition: A | Discharge status: Home / Self Care | Payer: 59 | Source: Ambulatory Visit | Attending: Cardiology | Admitting: Cardiology

## 2013-06-11 ENCOUNTER — Encounter (HOSPITAL_COMMUNITY): Payer: 59

## 2013-06-12 ENCOUNTER — Ambulatory Visit (HOSPITAL_COMMUNITY)
Admission: RE | Admit: 2013-06-12 | Discharge: 2013-06-12 | Disposition: A | Discharge status: Home / Self Care | Payer: 59 | Source: Ambulatory Visit | Attending: Neurology | Admitting: Neurology

## 2013-06-12 DIAGNOSIS — I10 Essential (primary) hypertension: Secondary | ICD-10-CM | POA: Insufficient documentation

## 2013-06-12 DIAGNOSIS — R748 Abnormal levels of other serum enzymes: Secondary | ICD-10-CM

## 2013-06-12 DIAGNOSIS — E8779 Other fluid overload: Secondary | ICD-10-CM | POA: Insufficient documentation

## 2013-06-12 DIAGNOSIS — E8770 Fluid overload, unspecified: Secondary | ICD-10-CM

## 2013-06-12 DIAGNOSIS — R531 Weakness: Secondary | ICD-10-CM

## 2013-06-12 DIAGNOSIS — I5041 Acute combined systolic (congestive) and diastolic (congestive) heart failure: Secondary | ICD-10-CM | POA: Insufficient documentation

## 2013-06-12 DIAGNOSIS — R5381 Other malaise: Secondary | ICD-10-CM | POA: Insufficient documentation

## 2013-06-12 MED ORDER — ALBUTEROL SULFATE (5 MG/ML) 0.5% IN NEBU
2.5000 mg | INHALATION_SOLUTION | Freq: Once | RESPIRATORY_TRACT | Status: AC
  Administered 2013-06-12: 2.5 mg via RESPIRATORY_TRACT

## 2013-06-13 ENCOUNTER — Encounter (HOSPITAL_COMMUNITY): Payer: 59

## 2013-06-16 ENCOUNTER — Encounter (HOSPITAL_COMMUNITY)
Admission: RE | Admit: 2013-06-16 | Discharge: 2013-06-16 | Disposition: A | Discharge status: Home / Self Care | Payer: 59 | Source: Ambulatory Visit | Attending: Cardiology | Admitting: Cardiology

## 2013-06-18 ENCOUNTER — Encounter (HOSPITAL_COMMUNITY): Payer: 59

## 2013-06-19 ENCOUNTER — Ambulatory Visit (INDEPENDENT_AMBULATORY_CARE_PROVIDER_SITE_OTHER): Payer: 59

## 2013-06-19 DIAGNOSIS — R5381 Other malaise: Secondary | ICD-10-CM

## 2013-06-19 DIAGNOSIS — R531 Weakness: Secondary | ICD-10-CM

## 2013-06-19 DIAGNOSIS — R748 Abnormal levels of other serum enzymes: Secondary | ICD-10-CM

## 2013-06-19 DIAGNOSIS — I10 Essential (primary) hypertension: Secondary | ICD-10-CM

## 2013-06-19 DIAGNOSIS — E8770 Fluid overload, unspecified: Secondary | ICD-10-CM

## 2013-06-19 DIAGNOSIS — I5041 Acute combined systolic (congestive) and diastolic (congestive) heart failure: Secondary | ICD-10-CM

## 2013-06-19 DIAGNOSIS — E8779 Other fluid overload: Secondary | ICD-10-CM

## 2013-06-20 ENCOUNTER — Telehealth: Payer: Self-pay | Admitting: Neurology

## 2013-06-20 ENCOUNTER — Encounter (HOSPITAL_COMMUNITY)
Admission: RE | Admit: 2013-06-20 | Discharge: 2013-06-20 | Disposition: A | Discharge status: Home / Self Care | Payer: 59 | Source: Ambulatory Visit | Attending: Cardiology | Admitting: Cardiology

## 2013-06-20 DIAGNOSIS — R531 Weakness: Secondary | ICD-10-CM

## 2013-06-20 DIAGNOSIS — I10 Essential (primary) hypertension: Secondary | ICD-10-CM

## 2013-06-20 DIAGNOSIS — E039 Hypothyroidism, unspecified: Secondary | ICD-10-CM

## 2013-06-20 NOTE — Telephone Encounter (Signed)
Dr. Terrace Arabia Pt returned your call. Please call back. [phone]

## 2013-06-20 NOTE — Telephone Encounter (Signed)
Left message for him to call back.  Annabelle Harman, please call him again, I need to discuss with him about MRI findings, please refer him to neurosurgeon ASAP

## 2013-06-23 ENCOUNTER — Encounter (HOSPITAL_COMMUNITY): Payer: 59

## 2013-06-23 ENCOUNTER — Telehealth: Payer: Self-pay | Admitting: Neurology

## 2013-06-23 NOTE — Telephone Encounter (Signed)
Patient has apt. With [facility]  09/30/ 2014 arrive at 9:30 am.

## 2013-06-23 NOTE — Telephone Encounter (Signed)
I have called multiple numbers, fail to reach patient, talked with his wife, explained MRI findings of severe cervical and lumbar stenosis, he will be evaluated by neurosurgeon Dr. Mikal Plane in Sept 30th, MRI films were picked up by patient today

## 2013-06-24 ENCOUNTER — Telehealth: Payer: Self-pay | Admitting: Neurology

## 2013-06-24 ENCOUNTER — Encounter: Payer: Self-pay | Admitting: Neurology

## 2013-06-25 ENCOUNTER — Encounter (HOSPITAL_COMMUNITY): Payer: 59

## 2013-06-27 ENCOUNTER — Encounter (HOSPITAL_COMMUNITY): Payer: 59

## 2013-06-30 ENCOUNTER — Encounter (HOSPITAL_COMMUNITY): Payer: 59

## 2013-07-02 ENCOUNTER — Encounter (HOSPITAL_COMMUNITY): Payer: 59

## 2013-07-04 ENCOUNTER — Encounter (HOSPITAL_COMMUNITY): Payer: 59

## 2013-07-07 ENCOUNTER — Encounter (HOSPITAL_COMMUNITY): Payer: 59

## 2013-07-08 ENCOUNTER — Other Ambulatory Visit: Payer: Self-pay | Admitting: Neurosurgery

## 2013-07-09 ENCOUNTER — Encounter (HOSPITAL_COMMUNITY): Payer: 59

## 2013-07-11 ENCOUNTER — Encounter (HOSPITAL_COMMUNITY): Admission: RE | Admit: 2013-07-11 | Payer: 59 | Source: Ambulatory Visit

## 2013-07-21 ENCOUNTER — Telehealth: Payer: Self-pay | Admitting: *Deleted

## 2013-07-24 ENCOUNTER — Encounter (HOSPITAL_COMMUNITY)
Admission: RE | Admit: 2013-07-24 | Discharge: 2013-07-24 | Disposition: A | Discharge status: Home / Self Care | Payer: 59 | Source: Ambulatory Visit | Attending: Neurosurgery | Admitting: Neurosurgery

## 2013-07-24 ENCOUNTER — Encounter (HOSPITAL_COMMUNITY): Payer: Self-pay

## 2013-07-24 DIAGNOSIS — Z01818 Encounter for other preprocedural examination: Secondary | ICD-10-CM | POA: Insufficient documentation

## 2013-07-24 DIAGNOSIS — Z01812 Encounter for preprocedural laboratory examination: Secondary | ICD-10-CM | POA: Insufficient documentation

## 2013-07-24 HISTORY — DX: Cardiomyopathy, unspecified: I42.9

## 2013-07-24 HISTORY — DX: Unspecified osteoarthritis, unspecified site: M19.90

## 2013-07-24 LAB — BASIC METABOLIC PANEL
BUN: 14 mg/dL (ref 6–23)
Calcium: 9.5 mg/dL (ref 8.4–10.5)
Chloride: 103 mEq/L (ref 96–112)
Creatinine, Ser: 0.95 mg/dL (ref 0.50–1.35)
GFR calc Af Amer: >90 mL/min (ref >90)
Sodium: 140 mEq/L (ref 135–145)

## 2013-07-24 LAB — CBC
MCHC: 34.5 g/dL (ref 30.0–36.0)
Platelets: 266 10*3/uL (ref 150–400)
RDW: 14.4 % (ref 11.5–15.5)
WBC: 5.4 10*3/uL (ref 4.0–10.5)

## 2013-07-24 LAB — SURGICAL PCR SCREEN
MRSA, PCR: NEGATIVE
Staphylococcus aureus: POSITIVE — AB

## 2013-07-24 NOTE — Progress Notes (Addendum)
Primary physician- dr. Creola Corn Cardiologist - dr. Jaquelyn Bitter oct 20 in chart, cath in epic from June 2014, ekg June 2014 - patient states he has received cardiac clearance from dr. [name] - will request these records from dr. Jerre Simon office  Patient bp elevated at PAT appointment but patient had not taken any of his morning bp medications prior to arrival to PAT.  Had documented bp log from cardiac rehab and normal bp readings after taking medications 100-130s/60-80s

## 2013-07-24 NOTE — Pre-Procedure Instructions (Signed)
Jakhai Graffeo  07/24/2013   Your procedure is scheduled on:  Wednesday, November 5th  Report to Main Entrance "A" and check in with admitting at 0630 AM.  Call this number if you have problems the morning of surgery: [phone]   Remember:   Do not eat food or drink liquids after midnight.   Take these medicines the morning of surgery with A SIP OF WATER: coreg, bidil, synthroid, digoxin, albuterol if needed, tylenol if needed  Stop taking aspirin, fish oil, over the counter vitamins/herbal medications, NSAIDS 5 days prior to surgery.   Do not wear jewelry.  Do not wear lotions, powders, or perfumes. You may wear deodorant.  Do not shave 48 hours prior to surgery. Men may shave face and neck.  Do not bring valuables to the hospital.  [facility] is not responsible  for any belongings or valuables.               Contacts, dentures or bridgework may not be worn into surgery.  Leave suitcase in the car. After surgery it may be brought to your room.  For patients admitted to the hospital, discharge time is determined by your  treatment team.               Patients discharged the day of surgery will not be allowed to drive home.   Special Instructions: Shower using CHG 2 nights before surgery and the night before surgery.  If you shower the day of surgery use CHG.  Use special wash - you have one bottle of CHG for all showers.  You should use approximately 1/3 of the bottle for each shower.   Please read over the following fact sheets that you were given: Pain Booklet, Coughing and Deep Breathing, MRSA Information and Surgical Site Infection Prevention

## 2013-07-25 ENCOUNTER — Encounter (HOSPITAL_COMMUNITY): Payer: Self-pay

## 2013-07-25 NOTE — Progress Notes (Addendum)
Anesthesia Chart Review:  Patient is a 62 year old male scheduled for C4-5, C5-6, C6-7 ACDF on 07/30/13 by Dr. Franky Macho.  History includes obesity, former smoker, Grave's disease s/p RAI therapy, hypothyroidism (no Synthroid), asthma, HTN, non-ischemic dilated cardiomyopathy, combined systolic and diastolic CHF, arthritis, substance abuse (with no current illicit drug use documented).  BP at PAT was 162/104; however, patient had not yet taken his anti-hypertensive medications. By notes, his cardiac rehab BP long indicates BP 100-130's/60-80's. PCP is Dr. Creola Corn.  Cardiologist is Dr. [name] at [facility] whom he is seeing today.  EKG on 03/21/13 ([facility]) showed SR, poor r wave progression, T wave abnormality consider lateral ischemia.    Echo on 07/14/13 ([facility]) showed LV is borderline dilated.  Moderate concentric hypertrophy.  Severe global hypokinesis.  Septum is more hypokinetic than other walls.  Severely decreased systolic global function.  Visual EF 20-25%.  Doppler evidence of grade I (impaired) diastolic dysfunction with elevated LV filling pressures.  LA cavity slightly dilated.  Mild aortic valve leaflet thickening.  Mild MR.  Trace TR.  Mild PR.  Cardiac cath on 03/11/13 showed: Normal right heart catheterizaton with preserved cardiac output and cardiac index. No suggestion of intracardiac shunting with normal Qp:Qs ratio. Normal coronary arteries, findings are suggestive of nonischemic dilated cardiomyopathy severe LV systolic dysfunction. Borderline decrease in cardiac output and cardiac index in a patient who is 6 feet 2 inches tall. LVEF of 20% Dilated LV. Global hypokinesis noted.   He had a low risk nuclear stress test, EF 44% in 05/2011 at [facility].  CXR on 03/10/13 showed: Normal sized heart. Tortuous aorta. Clear lungs with bilateral nipple shadows. The lungs are mildly hyperexpanded with mildly prominent interstitial markings. Minimal lower thoracic spine degenerative  changes.  Preoperative labs noted.  I'll follow-up cardiology records from today once available.  Velna Ochs [facility]/Anesthesiology Phone [phone] 07/25/2013 1:32 PM  Addendum: 07/28/2013 12:00 PM Friday's office visit note from Dr. [name] reviewed.  Patient was fell very well compensated form a heart failure standpoint.  Apparently, he had been hospitalized for rhabdomyolysis 4-5 months ago which may have been related to patient's spinal stenosis.  Dr. [name] ultimately cleared patient with "moderate risk for perioperative cardiovascular event given his cardiomyopathy; however due to his elevated blood pressure, no evidence of congestive heart failure, patient is recently in the best shape for him to have the procedure."  He did discuss the possibility of EP referral following recovery from his surgery.

## 2013-07-29 MED ORDER — VANCOMYCIN HCL IN DEXTROSE 1-5 GM/200ML-% IV SOLN
1000.0000 mg | INTRAVENOUS | Status: AC
  Administered 2013-07-30: 1000 mg via INTRAVENOUS
  Filled 2013-07-29: qty 200

## 2013-07-30 ENCOUNTER — Encounter (HOSPITAL_COMMUNITY): Payer: Self-pay | Admitting: Anesthesiology

## 2013-07-30 ENCOUNTER — Encounter (HOSPITAL_COMMUNITY)
Admission: RE | Disposition: A | Discharge status: Home / Self Care | Payer: Self-pay | Source: Ambulatory Visit | Attending: Neurosurgery

## 2013-07-30 ENCOUNTER — Encounter (HOSPITAL_COMMUNITY): Payer: 59 | Admitting: Vascular Surgery

## 2013-07-30 ENCOUNTER — Inpatient Hospital Stay (HOSPITAL_COMMUNITY)
Admission: RE | Admit: 2013-07-30 | Discharge: 2013-08-06 | DRG: 472 | Disposition: A | Discharge status: Home / Self Care | Payer: 59 | Source: Ambulatory Visit | Attending: Neurosurgery | Admitting: Neurosurgery

## 2013-07-30 ENCOUNTER — Ambulatory Visit (HOSPITAL_COMMUNITY): Payer: 59

## 2013-07-30 ENCOUNTER — Ambulatory Visit (HOSPITAL_COMMUNITY): Payer: 59 | Admitting: Anesthesiology

## 2013-07-30 DIAGNOSIS — M4712 Other spondylosis with myelopathy, cervical region: Principal | ICD-10-CM | POA: Diagnosis present

## 2013-07-30 DIAGNOSIS — M48061 Spinal stenosis, lumbar region without neurogenic claudication: Secondary | ICD-10-CM | POA: Diagnosis present

## 2013-07-30 DIAGNOSIS — G6181 Chronic inflammatory demyelinating polyneuritis: Secondary | ICD-10-CM | POA: Diagnosis present

## 2013-07-30 DIAGNOSIS — I10 Essential (primary) hypertension: Secondary | ICD-10-CM | POA: Diagnosis present

## 2013-07-30 DIAGNOSIS — Z881 Allergy status to other antibiotic agents status: Secondary | ICD-10-CM

## 2013-07-30 DIAGNOSIS — E038 Other specified hypothyroidism: Secondary | ICD-10-CM | POA: Diagnosis present

## 2013-07-30 DIAGNOSIS — E0500 Thyrotoxicosis with diffuse goiter without thyrotoxic crisis or storm: Secondary | ICD-10-CM | POA: Diagnosis present

## 2013-07-30 DIAGNOSIS — J45909 Unspecified asthma, uncomplicated: Secondary | ICD-10-CM | POA: Diagnosis present

## 2013-07-30 HISTORY — PX: ANTERIOR CERVICAL DECOMP/DISCECTOMY FUSION: SHX1161

## 2013-07-30 SURGERY — ANTERIOR CERVICAL DECOMPRESSION/DISCECTOMY FUSION 3 LEVELS
Anesthesia: General | Site: Spine Cervical | Wound class: Clean

## 2013-07-30 MED ORDER — PREDNISONE 5 MG PO TABS
5.0000 mg | ORAL_TABLET | Freq: Every day | ORAL | Status: DC
  Administered 2013-07-30 – 2013-08-06 (×8): 5 mg via ORAL
  Filled 2013-07-30 (×8): qty 1

## 2013-07-30 MED ORDER — ARTIFICIAL TEARS OP OINT
TOPICAL_OINTMENT | OPHTHALMIC | Status: DC | PRN
  Administered 2013-07-30: 1 via OPHTHALMIC

## 2013-07-30 MED ORDER — LACTATED RINGERS IV SOLN
INTRAVENOUS | Status: DC | PRN
  Administered 2013-07-30: 08:00:00 via INTRAVENOUS

## 2013-07-30 MED ORDER — DEXAMETHASONE SODIUM PHOSPHATE 10 MG/ML IJ SOLN
INTRAMUSCULAR | Status: DC | PRN
  Administered 2013-07-30: 10 mg via INTRAVENOUS

## 2013-07-30 MED ORDER — HYDROCODONE-ACETAMINOPHEN 5-325 MG PO TABS
1.0000 | ORAL_TABLET | ORAL | Status: DC | PRN
  Administered 2013-07-31 – 2013-08-02 (×3): 2 via ORAL
  Administered 2013-08-02: 1 via ORAL
  Administered 2013-08-02 – 2013-08-06 (×16): 2 via ORAL
  Filled 2013-07-30 (×3): qty 2
  Filled 2013-07-30: qty 1
  Filled 2013-07-30 (×16): qty 2

## 2013-07-30 MED ORDER — PHENYLEPHRINE HCL 10 MG/ML IJ SOLN
10.0000 mg | INTRAMUSCULAR | Status: DC | PRN
  Administered 2013-07-30: 25 ug/min via INTRAVENOUS

## 2013-07-30 MED ORDER — FENTANYL CITRATE 0.05 MG/ML IJ SOLN
INTRAMUSCULAR | Status: DC | PRN
  Administered 2013-07-30 (×9): 50 ug via INTRAVENOUS
  Administered 2013-07-30: 200 ug via INTRAVENOUS

## 2013-07-30 MED ORDER — 0.9 % SODIUM CHLORIDE (POUR BTL) OPTIME
TOPICAL | Status: DC | PRN
  Administered 2013-07-30 (×2): 1000 mL

## 2013-07-30 MED ORDER — OXYCODONE HCL 5 MG PO TABS: 5.0000 mg | ORAL_TABLET | Freq: Once | ORAL | Status: DC | PRN

## 2013-07-30 MED ORDER — ALBUTEROL SULFATE HFA 108 (90 BASE) MCG/ACT IN AERS
2.0000 | INHALATION_SPRAY | Freq: Four times a day (QID) | RESPIRATORY_TRACT | Status: DC | PRN
  Administered 2013-07-30 – 2013-08-05 (×3): 2 via RESPIRATORY_TRACT
  Filled 2013-07-30 (×2): qty 6.7

## 2013-07-30 MED ORDER — ACETAMINOPHEN 650 MG RE SUPP: 650.0000 mg | RECTAL | Status: DC | PRN

## 2013-07-30 MED ORDER — IRBESARTAN 150 MG PO TABS
150.0000 mg | ORAL_TABLET | Freq: Every day | ORAL | Status: DC
  Administered 2013-07-31 – 2013-08-06 (×7): 150 mg via ORAL
  Filled 2013-07-30 (×7): qty 1

## 2013-07-30 MED ORDER — ETOMIDATE 2 MG/ML IV SOLN
INTRAVENOUS | Status: DC | PRN
  Administered 2013-07-30: 20 mg via INTRAVENOUS

## 2013-07-30 MED ORDER — DIAZEPAM 5 MG PO TABS
5.0000 mg | ORAL_TABLET | Freq: Four times a day (QID) | ORAL | Status: DC | PRN
  Administered 2013-07-30 – 2013-08-06 (×13): 5 mg via ORAL
  Filled 2013-07-30 (×13): qty 1

## 2013-07-30 MED ORDER — MENTHOL 3 MG MT LOZG: 1.0000 | LOZENGE | OROMUCOSAL | Status: DC | PRN

## 2013-07-30 MED ORDER — SODIUM CHLORIDE 0.9 % IJ SOLN
3.0000 mL | Freq: Two times a day (BID) | INTRAMUSCULAR | Status: DC
  Administered 2013-08-01 – 2013-08-03 (×6): 3 mL via INTRAVENOUS
  Administered 2013-08-04: 22:00:00 via INTRAVENOUS

## 2013-07-30 MED ORDER — SENNA 8.6 MG PO TABS
1.0000 | ORAL_TABLET | Freq: Two times a day (BID) | ORAL | Status: DC
  Administered 2013-07-30 – 2013-08-06 (×14): 8.6 mg via ORAL
  Filled 2013-07-30 (×14): qty 1

## 2013-07-30 MED ORDER — ACETAMINOPHEN 325 MG PO TABS
650.0000 mg | ORAL_TABLET | ORAL | Status: DC | PRN
  Administered 2013-07-31: 650 mg via ORAL
  Filled 2013-07-30: qty 2

## 2013-07-30 MED ORDER — ADULT MULTIVITAMIN W/MINERALS CH
1.0000 | ORAL_TABLET | Freq: Every day | ORAL | Status: DC
  Administered 2013-07-30 – 2013-08-06 (×8): 1 via ORAL
  Filled 2013-07-30 (×8): qty 1

## 2013-07-30 MED ORDER — NEOSTIGMINE METHYLSULFATE 1 MG/ML IJ SOLN
INTRAMUSCULAR | Status: DC | PRN
  Administered 2013-07-30: 5 mg via INTRAVENOUS

## 2013-07-30 MED ORDER — PROMETHAZINE HCL 25 MG/ML IJ SOLN: 6.2500 mg | INTRAMUSCULAR | Status: DC | PRN

## 2013-07-30 MED ORDER — PHENOL 1.4 % MT LIQD
1.0000 | OROMUCOSAL | Status: DC | PRN
  Filled 2013-07-30: qty 177

## 2013-07-30 MED ORDER — CARVEDILOL 12.5 MG PO TABS
12.5000 mg | ORAL_TABLET | Freq: Two times a day (BID) | ORAL | Status: DC
  Administered 2013-07-30 – 2013-08-06 (×15): 12.5 mg via ORAL
  Filled 2013-07-30 (×17): qty 1

## 2013-07-30 MED ORDER — ONDANSETRON HCL 4 MG/2ML IJ SOLN: 4.0000 mg | INTRAMUSCULAR | Status: DC | PRN

## 2013-07-30 MED ORDER — SODIUM CHLORIDE 0.9 % IJ SOLN
3.0000 mL | INTRAMUSCULAR | Status: DC | PRN
  Administered 2013-08-06: 3 mL via INTRAVENOUS

## 2013-07-30 MED ORDER — POTASSIUM CHLORIDE IN NACL 20-0.9 MEQ/L-% IV SOLN
INTRAVENOUS | Status: DC
  Administered 2013-07-30: 18:00:00 via INTRAVENOUS
  Filled 2013-07-30 (×15): qty 1000

## 2013-07-30 MED ORDER — ALBUTEROL SULFATE HFA 108 (90 BASE) MCG/ACT IN AERS
INHALATION_SPRAY | RESPIRATORY_TRACT | Status: DC | PRN
  Administered 2013-07-30: 2 via RESPIRATORY_TRACT

## 2013-07-30 MED ORDER — ASPIRIN EC 81 MG PO TBEC
81.0000 mg | DELAYED_RELEASE_TABLET | Freq: Two times a day (BID) | ORAL | Status: DC
  Administered 2013-07-31 – 2013-08-06 (×12): 81 mg via ORAL
  Filled 2013-07-30 (×15): qty 1

## 2013-07-30 MED ORDER — THROMBIN 20000 UNITS EX SOLR
CUTANEOUS | Status: DC | PRN
  Administered 2013-07-30: 11:00:00 via TOPICAL

## 2013-07-30 MED ORDER — SODIUM CHLORIDE 0.9 % IV SOLN: 250.0000 mL | INTRAVENOUS | Status: DC

## 2013-07-30 MED ORDER — MEPERIDINE HCL 25 MG/ML IJ SOLN: 6.2500 mg | INTRAMUSCULAR | Status: DC | PRN

## 2013-07-30 MED ORDER — DIGOXIN 250 MCG PO TABS
0.2500 mg | ORAL_TABLET | Freq: Every day | ORAL | Status: DC
  Administered 2013-07-31 – 2013-08-06 (×7): 0.25 mg via ORAL
  Filled 2013-07-30 (×7): qty 1

## 2013-07-30 MED ORDER — OXYCODONE HCL 5 MG/5ML PO SOLN: 5.0000 mg | Freq: Once | ORAL | Status: DC | PRN

## 2013-07-30 MED ORDER — VANCOMYCIN HCL 10 G IV SOLR
1750.0000 mg | Freq: Once | INTRAVENOUS | Status: AC
  Administered 2013-07-30: 1750 mg via INTRAVENOUS
  Filled 2013-07-30: qty 1750

## 2013-07-30 MED ORDER — LEVOTHYROXINE SODIUM 175 MCG PO TABS
175.0000 ug | ORAL_TABLET | Freq: Every day | ORAL | Status: DC
  Administered 2013-07-31 – 2013-08-06 (×7): 175 ug via ORAL
  Filled 2013-07-30 (×9): qty 1

## 2013-07-30 MED ORDER — MUPIROCIN 2 % EX OINT
TOPICAL_OINTMENT | CUTANEOUS | Status: AC
  Administered 2013-07-30: 1
  Filled 2013-07-30: qty 22

## 2013-07-30 MED ORDER — ISOSORB DINITRATE-HYDRALAZINE 20-37.5 MG PO TABS
1.0000 | ORAL_TABLET | Freq: Three times a day (TID) | ORAL | Status: DC
  Administered 2013-07-30 – 2013-08-06 (×22): 1 via ORAL
  Filled 2013-07-30 (×23): qty 1

## 2013-07-30 MED ORDER — GLYCOPYRROLATE 0.2 MG/ML IJ SOLN
INTRAMUSCULAR | Status: DC | PRN
  Administered 2013-07-30: .8 mg via INTRAVENOUS
  Administered 2013-07-30: .2 mg via INTRAVENOUS

## 2013-07-30 MED ORDER — OXYCODONE-ACETAMINOPHEN 5-325 MG PO TABS
1.0000 | ORAL_TABLET | ORAL | Status: DC | PRN
  Administered 2013-07-30 – 2013-08-05 (×11): 2 via ORAL
  Filled 2013-07-30 (×11): qty 2

## 2013-07-30 MED ORDER — ALUM & MAG HYDROXIDE-SIMETH 200-200-20 MG/5ML PO SUSP: 30.0000 mL | Freq: Four times a day (QID) | ORAL | Status: DC | PRN

## 2013-07-30 MED ORDER — HYDROMORPHONE HCL PF 1 MG/ML IJ SOLN: 0.5000 mg | INTRAMUSCULAR | Status: DC | PRN

## 2013-07-30 MED ORDER — ROCURONIUM BROMIDE 100 MG/10ML IV SOLN
INTRAVENOUS | Status: DC | PRN
  Administered 2013-07-30: 10 mg via INTRAVENOUS
  Administered 2013-07-30: 50 mg via INTRAVENOUS
  Administered 2013-07-30 (×7): 10 mg via INTRAVENOUS

## 2013-07-30 MED ORDER — EPHEDRINE SULFATE 50 MG/ML IJ SOLN
INTRAMUSCULAR | Status: DC | PRN
  Administered 2013-07-30 (×5): 10 mg via INTRAVENOUS

## 2013-07-30 MED ORDER — ONDANSETRON HCL 4 MG/2ML IJ SOLN
INTRAMUSCULAR | Status: DC | PRN
  Administered 2013-07-30: 4 mg via INTRAVENOUS

## 2013-07-30 MED ORDER — HYDROMORPHONE HCL PF 1 MG/ML IJ SOLN: 0.2500 mg | INTRAMUSCULAR | Status: DC | PRN

## 2013-07-30 MED ORDER — MUPIROCIN 2 % EX OINT
TOPICAL_OINTMENT | Freq: Once | CUTANEOUS | Status: DC
  Filled 2013-07-30: qty 22

## 2013-07-30 MED ORDER — VITAMIN B-12 100 MCG PO TABS
50.0000 ug | ORAL_TABLET | Freq: Every day | ORAL | Status: DC
  Administered 2013-07-30: 18:00:00 via ORAL
  Administered 2013-07-31 – 2013-08-06 (×7): 50 ug via ORAL
  Filled 2013-07-30 (×8): qty 1

## 2013-07-30 MED ORDER — LIDOCAINE HCL 2 % EX GEL
Freq: Once | CUTANEOUS | Status: DC
  Filled 2013-07-30: qty 5

## 2013-07-30 MED ORDER — ALBUMIN HUMAN 5 % IV SOLN
INTRAVENOUS | Status: DC | PRN
  Administered 2013-07-30: 11:00:00 via INTRAVENOUS

## 2013-07-30 MED ORDER — LIDOCAINE-EPINEPHRINE 0.5 %-1:200000 IJ SOLN
INTRAMUSCULAR | Status: DC | PRN
  Administered 2013-07-30: 5 mL

## 2013-07-30 MED ORDER — FUROSEMIDE 20 MG PO TABS
20.0000 mg | ORAL_TABLET | Freq: Every day | ORAL | Status: DC
  Administered 2013-07-30 – 2013-08-06 (×8): 20 mg via ORAL
  Filled 2013-07-30 (×8): qty 1

## 2013-07-30 MED ORDER — MIDAZOLAM HCL 5 MG/5ML IJ SOLN
INTRAMUSCULAR | Status: DC | PRN
  Administered 2013-07-30: 2 mg via INTRAVENOUS

## 2013-07-30 SURGICAL SUPPLY — 77 items
BANDAGE GAUZE ELAST BULKY 4 IN (GAUZE/BANDAGES/DRESSINGS) ×4 IMPLANT
BIT DRILL NEURO 2X3.1 SFT TUCH (MISCELLANEOUS) ×1 IMPLANT
BLADE SURG ROTATE 9660 (MISCELLANEOUS) IMPLANT
BUR DRUM 4.0 (BURR) ×2 IMPLANT
BUR ROUND FLUTED 4 SOFT TCH (BURR) ×2 IMPLANT
CAGE CORPECTOMY 34MM (Cage) ×2 IMPLANT
CANISTER SUCT 3000ML (MISCELLANEOUS) ×2 IMPLANT
CONT SPEC 4OZ CLIKSEAL STRL BL (MISCELLANEOUS) ×2 IMPLANT
DECANTER SPIKE VIAL GLASS SM (MISCELLANEOUS) ×2 IMPLANT
DERMABOND ADHESIVE PROPEN (GAUZE/BANDAGES/DRESSINGS) ×1
DERMABOND ADVANCED (GAUZE/BANDAGES/DRESSINGS)
DERMABOND ADVANCED .7 DNX12 (GAUZE/BANDAGES/DRESSINGS) IMPLANT
DERMABOND ADVANCED .7 DNX6 (GAUZE/BANDAGES/DRESSINGS) ×1 IMPLANT
DRAPE LAPAROTOMY 100X72 PEDS (DRAPES) ×2 IMPLANT
DRAPE MICROSCOPE LEICA (MISCELLANEOUS) ×2 IMPLANT
DRAPE POUCH INSTRU U-SHP 10X18 (DRAPES) ×2 IMPLANT
DRAPE PROXIMA HALF (DRAPES) ×2 IMPLANT
DRILL NEURO 2X3.1 SOFT TOUCH (MISCELLANEOUS) ×2
DURAPREP 6ML APPLICATOR 50/CS (WOUND CARE) ×2 IMPLANT
ELECT COATED BLADE 2.86 ST (ELECTRODE) ×2 IMPLANT
ELECT REM PT RETURN 9FT ADLT (ELECTROSURGICAL) ×2
ELECTRODE REM PT RTRN 9FT ADLT (ELECTROSURGICAL) ×1 IMPLANT
GAUZE SPONGE 4X4 16PLY XRAY LF (GAUZE/BANDAGES/DRESSINGS) IMPLANT
GLOVE BIO SURGEON STRL SZ 6.5 (GLOVE) IMPLANT
GLOVE BIO SURGEON STRL SZ7 (GLOVE) IMPLANT
GLOVE BIO SURGEON STRL SZ7.5 (GLOVE) IMPLANT
GLOVE BIO SURGEON STRL SZ8 (GLOVE) ×2 IMPLANT
GLOVE BIO SURGEON STRL SZ8.5 (GLOVE) IMPLANT
GLOVE BIOGEL M 8.0 STRL (GLOVE) IMPLANT
GLOVE BIOGEL PI IND STRL 7.0 (GLOVE) ×2 IMPLANT
GLOVE BIOGEL PI IND STRL 7.5 (GLOVE) ×3 IMPLANT
GLOVE BIOGEL PI INDICATOR 7.0 (GLOVE) ×2
GLOVE BIOGEL PI INDICATOR 7.5 (GLOVE) ×3
GLOVE ECLIPSE 6.5 STRL STRAW (GLOVE) ×4 IMPLANT
GLOVE ECLIPSE 7.0 STRL STRAW (GLOVE) IMPLANT
GLOVE ECLIPSE 7.5 STRL STRAW (GLOVE) ×4 IMPLANT
GLOVE ECLIPSE 8.0 STRL XLNG CF (GLOVE) IMPLANT
GLOVE ECLIPSE 8.5 STRL (GLOVE) IMPLANT
GLOVE EXAM NITRILE LRG STRL (GLOVE) IMPLANT
GLOVE EXAM NITRILE MD LF STRL (GLOVE) IMPLANT
GLOVE EXAM NITRILE XL STR (GLOVE) IMPLANT
GLOVE EXAM NITRILE XS STR PU (GLOVE) IMPLANT
GLOVE INDICATOR 6.5 STRL GRN (GLOVE) IMPLANT
GLOVE INDICATOR 7.0 STRL GRN (GLOVE) IMPLANT
GLOVE INDICATOR 7.5 STRL GRN (GLOVE) IMPLANT
GLOVE INDICATOR 8.0 STRL GRN (GLOVE) IMPLANT
GLOVE INDICATOR 8.5 STRL (GLOVE) ×2 IMPLANT
GLOVE OPTIFIT SS 8.0 STRL (GLOVE) IMPLANT
GLOVE SURG SS PI 6.5 STRL IVOR (GLOVE) IMPLANT
GLOVE SURG SS PI 7.0 STRL IVOR (GLOVE) ×6 IMPLANT
GOWN BRE IMP SLV AUR LG STRL (GOWN DISPOSABLE) ×2 IMPLANT
GOWN BRE IMP SLV AUR XL STRL (GOWN DISPOSABLE) ×8 IMPLANT
GOWN STRL REIN 2XL LVL4 (GOWN DISPOSABLE) IMPLANT
HEAD HALTER (SOFTGOODS) ×2 IMPLANT
HEMOSTAT SURGICEL 2X14 (HEMOSTASIS) IMPLANT
KIT BASIN OR (CUSTOM PROCEDURE TRAY) ×2 IMPLANT
KIT ROOM TURNOVER OR (KITS) ×2 IMPLANT
NEEDLE HYPO 25X1 1.5 SAFETY (NEEDLE) ×2 IMPLANT
NEEDLE SPNL 22GX3.5 QUINCKE BK (NEEDLE) ×4 IMPLANT
NS IRRIG 1000ML POUR BTL (IV SOLUTION) ×4 IMPLANT
PACK LAMINECTOMY NEURO (CUSTOM PROCEDURE TRAY) ×2 IMPLANT
PAD ARMBOARD 7.5X6 YLW CONV (MISCELLANEOUS) ×6 IMPLANT
PIN DISTRACTION 14MM (PIN) ×4 IMPLANT
PLATE HELIX-T 50MM 2LVL (Plate) ×2 IMPLANT
RUBBERBAND STERILE (MISCELLANEOUS) ×4 IMPLANT
SCREW HELIX T SELF TAP 15MM (Screw) ×8 IMPLANT
SPONGE INTESTINAL PEANUT (DISPOSABLE) ×2 IMPLANT
SPONGE SURGIFOAM ABS GEL 100 (HEMOSTASIS) ×2 IMPLANT
SUT VIC AB 0 CT1 27 (SUTURE) ×1
SUT VIC AB 0 CT1 27XBRD ANTBC (SUTURE) ×1 IMPLANT
SUT VIC AB 3-0 SH 8-18 (SUTURE) ×4 IMPLANT
SYR 20ML ECCENTRIC (SYRINGE) ×2 IMPLANT
TOWEL OR 17X24 6PK STRL BLUE (TOWEL DISPOSABLE) ×2 IMPLANT
TOWEL OR 17X26 10 PK STRL BLUE (TOWEL DISPOSABLE) ×2 IMPLANT
TRAY FOLEY CATH 14FRSI W/METER (CATHETERS) ×2 IMPLANT
TRAY FOLEY CATH 16FRSI W/METER (SET/KITS/TRAYS/PACK) IMPLANT
WATER STERILE IRR 1000ML POUR (IV SOLUTION) ×2 IMPLANT

## 2013-07-30 NOTE — Anesthesia Preprocedure Evaluation (Signed)
Anesthesia Evaluation  Patient identified by MRN, date of birth, ID band Patient awake    Reviewed: Allergy & Precautions, H&P , NPO status , Patient's Chart, lab work & pertinent test results  History of Anesthesia Complications Negative for: history of anesthetic complications  Airway Mallampati: II TM Distance: >3 FB Neck ROM: Full    Dental  (+) Poor Dentition, Dental Advisory Given, Loose, Missing and Chipped   Pulmonary asthma , COPD COPD inhaler, former smoker,  breath sounds clear to auscultation  Pulmonary exam normal       Cardiovascular hypertension, Pt. 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  ) and + DOE Rhythm:Regular Rate:Normal  10/14 ECHO: EF 20-25%   Neuro/Psych cervical myelopathy: R arm weakness, R leg weakness    GI/Hepatic   Endo/Other  Hypothyroidism (s/p Grave's disease)   Renal/GU      Musculoskeletal   Abdominal (+) + obese,   Peds  Hematology   Anesthesia Other Findings   Reproductive/Obstetrics                           Anesthesia Physical Anesthesia Plan  ASA: III  Anesthesia Plan: General   Post-op Pain Management:    Induction: Intravenous  Airway Management Planned: Oral ETT and Video Laryngoscope Planned  Additional Equipment:   Intra-op Plan:   Post-operative Plan: Extubation in OR  Informed Consent: I have reviewed the patients History and Physical, chart, labs and discussed the procedure including the risks, benefits and alternatives for the proposed anesthesia with the patient or authorized representative who has indicated his/her understanding and acceptance.   Dental advisory given  Plan Discussed with: Surgeon and CRNA  Anesthesia Plan Comments: (Plan routine monitors, GETA with VideoGlide intubation  Sandford Craze, MD)        Anesthesia Quick Evaluation

## 2013-07-30 NOTE — Op Note (Signed)
07/30/2013  1:57 PM  PATIENT:  Peter [name]  62 y.o. male cervical spondylosis, cord signal, cervical myelopathy from C4-C7  PRE-OPERATIVE DIAGNOSIS:  cervical spondylosis with myelopathy C4-7  POST-OPERATIVE DIAGNOSIS:  Cervical spondylosis with myelopathy C4-7  PROCEDURE:  Procedure(s): CERVICAL FIVE,CERVICAL SIX CORPECTOMY ,ANTERIOR ARTHRODESIS,PEEK INTERBODY GRAFT CERVICAL FOUR-CERVICAL SEVEN ,ANTERIOR INSTRUMENTATION C4-7 Helix instrumentation  SURGEON:  Surgeon(s): Carmela Hurt, MD Mariam Dollar, MD  ASSISTANTS:Cram, Jillyn Hidden  ANESTHESIA:   general  EBL:  Total I/O In: 1700 [I.V.:1200; IV Piggyback:500] Out: 1200 [Urine:850; Blood:350]  BLOOD ADMINISTERED:none  CELL SAVER GIVEN:none  COUNT:per nursing  DRAINS: none   SPECIMEN:  No Specimen  DICTATION: Peter [name] was taken to the operating room, intubated and placed under a general anesthetic without difficulty. A foley catheter was placed under sterile conditions without difficulty. He was positioned supine on the OR table, with his head on a horseshoe headrest in a neutral position. His neck was prepped and draped in a sterile fashion. I opened the incision with a 10 blade and dissected sharply to the platysma. I used the Metzenbaum scissors to dissect in the plane both superior and inferior to the platysma to increase the working space. With both blunt and sharp technique I created an avascular plane to the cervical spine. I placed a needle into the disc space and confirmed my location with an intraoperative xray. I then reflected the longus colli muscles bilaterally from C4-C7, placed a self retaining retractor, and proceeded with the corpectomy.  I decompressed the spinal canal from C4-7 by performing corpectomies of C5, and C6. I used the drill, Leksell rongeurs, Kerrison punches and dissectors to remove the corpi and expose the dura. I decompressed the nerve roots at each level, including the C5,6, and C7 roots  bilaterally. The spinal canal now haven been opened I moved on the the arthrodesis.  I measured the space and placed a 34mm Peek strut graft filled with morselized autograft with Dr. Dola Argyle assistance.  We then placed a 50mm translational plate from nuvasive to span the C4-7 space. 2 screws were placed at C4 and C7. Final xray showed the screws at C4 to be in good position.  We irrigated then closed the wound in layers approximating the platysma, and subcuticular plane. I used dermabond for a sterile dressing.   PLAN OF CARE: Admit to inpatient   PATIENT DISPOSITION:  PACU - hemodynamically stable.   Delay start of Pharmacological VTE agent (>24hrs) due to surgical blood loss or risk of bleeding:  yes

## 2013-07-30 NOTE — Anesthesia Procedure Notes (Signed)
Procedure Name: Intubation Date/Time: 07/30/2013 9:08 AM Performed by: Sherie Don Pre-anesthesia Checklist: Patient identified, Emergency Drugs available, Suction available, Patient being monitored and Timeout performed Patient Re-evaluated:Patient Re-evaluated prior to inductionOxygen Delivery Method: Circle system utilized Preoxygenation: Pre-oxygenation with 100% oxygen Intubation Type: IV induction Ventilation: Two handed mask ventilation required Laryngoscope Size: Mac and 3 (Adult Glidescope blade) Grade View: Grade II Tube type: Oral Tube size: 8.0 mm Number of attempts: 1 Airway Equipment and Method: Stylet Placement Confirmation: positive ETCO2,  ETT inserted through vocal cords under direct vision and breath sounds checked- equal and bilateral Secured at: 22 cm Tube secured with: Tape Dental Injury: Teeth and Oropharynx as per pre-operative assessment

## 2013-07-30 NOTE — Transfer of Care (Signed)
Immediate Anesthesia Transfer of Care Note  Patient: Orian Legacy  Procedure(s) Performed: Procedure(s): CERVICAL FIVE,CERVICAL SIX CORPECTOMY,ANTERIOR ARTHRODESIS,PEEK INTERBODY GRAFT CERVICAL FOUR-CERVICAL SEVEN,ANTERIOR INSTRUMENTATION (N/A)  Patient Location: PACU  Anesthesia Type general  Level of Consciousness: sedated and patient cooperative  Airway & Oxygen Therapy: Patient Spontanous Breathing and Patient connected to face mask oxygen  Post-op Assessment: Report given to PACU RN and Post -op Vital signs reviewed and stable  Post vital signs: Reviewed and stable  Complications: No apparent anesthesia complications

## 2013-07-30 NOTE — H&P (Addendum)
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  He states that his problems started some time in April of this year.  He noticed that he had greater difficulty with his balance and gait and greater difficulty using his hands due to poor coordination.  While this was going on, he developed acute congestive heart failure and was admitted to [facility] on 03/10/2013 and was eventually diagnosed with a viral cardiomyopathy.  He also has been dealing with hypothyroidism secondary to Graves' disease and thyroidectomy since 2012.  It has been quite problematic getting his thyroid level to normal and he has had his medications adjusted on multiple occasions.  He states he was also given a diagnosis, so he does not know exactly when or why of chronic inflammatory demyelinating polyneuropathy.  He has had an EMG which was performed by Dr. Terrace Arabia along with the MRIs.  As a result of findings on his initial examination, he had his entire neuraxis evaluated and what he does have is increased cord signal at C6-7 with edema up and down into cord at that level.  Though that is not where his greatest amount of stenosis is.  He has difficulty with his hands, can barely button his shirt.  His balance is off.  His coordination is off, but he has continued to work as best as he could.  He does have pain, but that is not what his biggest complaint is.  He was quite fatigued earlier this year prior to him finding out that he had the viral cardiomyopathy.   He has weakness on his right side arm and leg, pain in his right arm and right leg, tingling in the right arm and hand and bottom of his feet.   REVIEW OF SYSTEMS:                                    Positive for eye glasses, hypertension, swelling in his feet, leg pain with walking, asthma, arm weakness, leg  weakness, back pain, arm pain, leg pain, joint pain, skin disease, problems with coordination in the arms and legs, thyroid disease, and inhalent allergies.   On his pain chart, he only lists problems on his right forearm and thigh.  He states that the pain is much worse and he states he is almost chronically immobile.   PAST MEDICAL HISTORY:            Current Medical Conditions:  Also includes hypertension and asthma.            Medications and Allergies:  HE IS ALLERGIC TO KEFLEX.  Medications include acetaminophen, prednisone, albuterol, aspirin, carvedilol, digoxin, Lasix, glucosamine, isosorbide, hydralazine, levothyroxine, and multivitamin, Olmesartan, Omega-3, vitamin B12, and tramadol.   FAMILY HISTORY:                                            Mother and father both deceased.   SOCIAL HISTORY:  He does not smoke,  He does not use alcohol.  He does not use illicit drugs.   PHYSICAL EXAMINATION:                                He is 74 inches in height.  He weighs 235 pounds and this is self reported.  On examination, he is alert and oriented x4 and answers all questions appropriately.  Memory, language, attention span and fund of knowledge are normal.  He is well kempt and in obvious distress.  He has markedly myelopathic gait and is spastic.  He is hyperreflexic at the knees and ankles.  1 to 2+ reflexes in the upper extremities.  He has suprapatellar jerk also.  Bilateral Hoffmann sign.  No clonus.  Proprioception intact in the upper and lower extremities though he mentions that the left great toe might be a little worse than the others.  Pupils are equal, round and reactive to light.  Full extraocular movements.  Full visual fields.  Hearing intact to finger rub.  Uvula elevates in midline.  Shoulder shrug is normal.  Tongue protrudes in the midline. Weakness in the right upper and lower extremity. Better in the left though strength is not normal.     IMAGING STUDIES:                                          MRI of the cervical spine, thoracic spine, lumbar spine were reviewed.  Lumbar spine MRI shows significant and quite severe spinal stenosis at L4-5 where he has facet arthropathy and ligamentous hypertrophy to conus and cauda, otherwise normal.  Thoracic spine is normal, but it does show the inferior portion of the cervical spine which shows cord edema and increased signal at C6-7 level confirmed by the C-spine MRI which shows stenosis and great deal of spondylitic change at C4-5, C5-6, C6-7, and some also at C7-T1.  Paraspinous soft tissue is otherwise normal.  He does not have herniated disc at these levels, but does have spondylitic ridging and very severe biforaminal narrowing, but his biggest problem is the canal stenosis.   DIAGNOSIS:                                                     Cervical spondylosis with myelopathy, cervical stenosis, and lumbar stenosis.   RECOMMENDATIONS:                                      I have recommended to Mr. Hovatter that we do a decompression next week of the cervical spine.  Risks and benefits, bleeding, infection, no relief, worsening of his problem were discussed.  I gave him a detailed instruction sheet with regards to this.  We went over in detail the operation, how and what will be done using models also.  He understands and wishes to proceed.  I will see him next week Tuesday.

## 2013-07-30 NOTE — Progress Notes (Signed)
ANTIBIOTIC CONSULT NOTE - INITIAL  Pharmacy Consult for Vancomycin Indication: Empir[employer] Coverage Post-op  Allergies  Allergen Reactions  . Keflex [Cephalexin] Anaphylaxis  . Aspirin     Exacerbates asthma attacks.    Patient Measurements: Wt Readings from Last 3 Encounters:  07/24/13 235 lb 1.6 oz (106.641 kg)  04/03/13 240 lb 4.8 oz (109 kg)  03/12/13 234 lb 2.1 oz (106.2 kg)  Adjusted Body Weight: ~ 95kg  Height:  72 inches  Vital Signs: Temp: 97.5 F (36.4 C) (11/05 1625) Temp src: Oral (11/05 1625) BP: 146/75 mmHg (11/05 1625) Pulse Rate: 87 (11/05 1625)  Total I/O In: 1700 [I.V.:1200; IV Piggyback:500] Out: 1450 [Urine:1100; Blood:350]  Labs: No results found for this basename: WBC, HGB, PLT, LABCREA, CREATININE,  in the last 72 hours  Microbiology: Recent Results (from the past 720 hour(s))  SURGICAL PCR SCREEN     Status: Abnormal   Collection Time    07/24/13 10:48 AM      Result Value Range Status   MRSA, PCR NEGATIVE  NEGATIVE Final   Staphylococcus aureus POSITIVE (*) NEGATIVE Final   Comment:            The Xpert SA Assay (FDA     approved for NASAL specimens     in patients over 39 years of age),     is one component of     a comprehensive surveillance     program.  Test performance has     been validated by [employer] for patients greater     than or equal to 15 year old.     It is not intended     to diagnose infection nor to     guide or monitor treatment.   Medical History: Past Medical History  Diagnosis Date  . Grave's disease   . Allergy   . Asthma   . Hypertension   . Substance abuse   . Graves disease   . Acute combined systolic and diastolic heart failure 03/11/2013  . CHF (congestive heart failure)   . Shortness of breath     with asthma  . Arthritis   . Cardiomyopathy    Medications:  Anti-infectives   Start     Dose/Rate Route Frequency Ordered Stop   07/30/13 0600  vancomycin (VANCOCIN) IVPB 1000 mg/200 mL premix      1,000 mg 200 mL/hr over 60 Minutes Intravenous On call to O.R. 07/29/13 1438 07/30/13 0845     Assessment: 62yo male admitted with complaints with his balance and gait.  He was found to have some stenosis of the spinal cord and underwent surgery today.  He received IV Vancomycin around 09:00 AM without noted complications.  He has an allergy to Keflex for which he has anaphylaxis to.  We have been asked to provide one more dose of IV Vancomycin to cover him empirically post-op.  He has a creatinine of 0.95 with an estimated clearance of ~ 105 ml/min.  He is obese with a weight of 106kg and adjusted weight of 95kg using 40% as the difference between ABW and IBW.    Goal of Therapy:  Appropriate antibiotic coverage post-op x 1 dose  Plan:  1.  Vancomycin 1750 mg IV x 1 2.  Please let us know if we can provide further care for this patient.  Nadara Mustard, PharmD., MS Clinical Pharmacist Pager:  [phone] Thank you for allowing pharmacy to be part of this patients care  team. 07/30/2013,4:34 PM

## 2013-07-30 NOTE — Preoperative (Signed)
Beta Blockers   Reason not to administer Beta Blockers:Coreg taken this am 

## 2013-07-30 NOTE — Anesthesia Postprocedure Evaluation (Signed)
  Anesthesia Post-op Note  Patient: Peter [name]  Procedure(s) Performed: Procedure(s): CERVICAL FIVE,CERVICAL SIX CORPECTOMY,ANTERIOR ARTHRODESIS,PEEK INTERBODY GRAFT CERVICAL FOUR-CERVICAL SEVEN,ANTERIOR INSTRUMENTATION (N/A)  Patient Location: PACU  Anesthesia Type:General  Level of Consciousness: awake, alert , oriented and patient cooperative  Airway and Oxygen Therapy: Patient Spontanous Breathing and Patient connected to nasal cannula oxygen  Post-op Pain: mild  Post-op Assessment: Post-op Vital signs reviewed, Patient's Cardiovascular Status Stable, Respiratory Function Stable, Patent Airway, No signs of Nausea or vomiting and Pain level controlled  Post-op Vital Signs: Reviewed and stable  Complications: No apparent anesthesia complications

## 2013-07-31 MED ORDER — DEXAMETHASONE SODIUM PHOSPHATE 10 MG/ML IJ SOLN
10.0000 mg | Freq: Once | INTRAMUSCULAR | Status: AC
  Administered 2013-07-31: 10 mg via INTRAVENOUS
  Filled 2013-07-31 (×2): qty 1

## 2013-07-31 NOTE — Evaluation (Signed)
Occupational Therapy Evaluation Patient Details Name: Peter [name] MRN: [id number] DOB: [birth year] Today's Date: 07/31/2013 Time: 0817-0902 OT Time Calculation (min): 45 min  OT Assessment / Plan / Recommendation History of present illness pt presents with R sided weakness now s/p C5-6 Corpectomy and C4-7 ACDF with worsening of R sided weakness and now involving L UE weakness.     Clinical Impression   Patient is s/p ACDF C4-7 surgery resulting in functional limitations due to the deficits listed below (see OT problem list).  Patient will benefit from skilled OT acutely to increase independence and safety with ADLS to allow discharge CIR. Pt with bil Ue deficits affecting all ADLS. Pt needs (A) for self feeding at this time.     OT Assessment  Patient needs continued OT Services    Follow Up Recommendations  CIR    Barriers to Discharge      Equipment Recommendations       Recommendations for Other Services Rehab consult  Frequency  Min 2X/week    Precautions / Restrictions Precautions Precautions: Cervical;Fall Restrictions Weight Bearing Restrictions: No   Pertinent Vitals/Pain Pt reports much effort to move BIL UE    ADL  Eating/Feeding: Moderate assistance;Other (comment) (plate guard and built up utensil required) Where Assessed - Eating/Feeding: Chair Grooming: Wash/dry face;Minimal assistance Where Assessed - Grooming: Supported sitting ADL Comments: Pt finishing with PT Megan on arrival. Pt required (A) for stand <>Sit see PT eval. Pt with UE deficits affecting ability to utilize UE for safety and reaching for chair. Session focused on self feeding and grooming. pt provided pink pitcher with straw to allow pt to place LT UE in large handle for independent drinking. Pt provided built up utensil and plate guard. pt required extensive effort and experience fatigue attempting to complete meal. Pt reports LT UE is now more affected than PTA. Pt reports Rt UE with  greater sensation than LT UE. Pt states "I can tell Rt Ue probably 2% worse but this Lt UE shouldnt require this much brain power to make it work." Pt completing meal and positioned in chair. pt educated to avoid sitting in chair for > 8 hours today and must take time to lay down supine. Pt educated on hand exercises listed below. Pt provided new gown. Pt required (A) to thread bil UE in gown.     OT Diagnosis: Generalized weakness;Acute pain  OT Problem List: Decreased strength;Decreased activity tolerance;Impaired balance (sitting and/or standing);Decreased safety awareness;Decreased knowledge of use of DME or AE;Decreased knowledge of precautions;Pain OT Treatment Interventions: Self-care/ADL training;Therapeutic exercise;DME and/or AE instruction;Therapeutic activities;Patient/family education;Balance training   OT Goals(Current goals can be found in the care plan section) Acute Rehab OT Goals Patient Stated Goal: Move normally again.   OT Goal Formulation: With patient Time For Goal Achievement: 08/14/13 Potential to Achieve Goals: Good ADL Goals Pt Will Perform Eating: with set-up;with adaptive utensils;sitting Pt Will Perform Grooming: with set-up;sitting Pt Will Perform Upper Body Bathing: with set-up;sitting Pt Will Perform Lower Body Bathing: with set-up;sit to/from stand Pt Will Transfer to Toilet: with min assist;bedside commode  Visit Information  Last OT Received On: 07/31/13 Assistance Needed: +1 History of Present Illness: pt presents with R sided weakness now s/p C5-6 Corpectomy and C4-7 ACDF with worsening of R sided weakness and now involving L UE weakness.         Prior Functioning     Home Living Family/patient expects to be discharged to:: Unsure Living Arrangements: Spouse/significant other Available Help at Discharge:  Family;Available 24 hours/day Type of Home: House Home Access: Stairs to enter [employer] of Steps: 1 Entrance Stairs-Rails:  None Home Layout: One level Home Equipment: None Prior Function Level of Independence: Independent Comments: pt works as Hydrologist.   Communication Communication: No difficulties Dominant Hand: Right         Vision/Perception Vision - History Baseline Vision: No visual deficits Patient Visual Report: No change from baseline   Cognition  Cognition Arousal/Alertness: Awake/alert Behavior During Therapy: WFL for tasks assessed/performed Overall Cognitive Status: Within Functional Limits for tasks assessed    Extremity/Trunk Assessment Upper Extremity Assessment Upper Extremity Assessment: RUE deficits/detail;LUE deficits/detail RUE Deficits / Details: AROM shoulder flexion ~50 degrees, decr sensation, gross grasp 2+ out 5 MMT, pt with decr digit flexion, difficulty holding utensils, using shoulder/ scapular accessory muscle to (A) with hand to mouth feeding RUE Sensation: decreased light touch RUE Coordination: decreased fine motor;decreased gross motor LUE Deficits / Details: AROM shoulder flexion ~70 degree, decr sensation, able to bring hand to mouth with extensive effort, pt presents with a claw grasp and using a hook grasp to pick up utensils LUE Sensation: decreased light touch LUE Coordination: decreased fine motor;decreased gross motor Lower Extremity Assessment Lower Extremity Assessment: Defer to PT evaluation RLE Deficits / Details: R foot drop with strength ankle/foot 3-/5, knee 4-/5 and hip 4/5.   RLE Sensation: decreased light touch;decreased proprioception RLE Coordination: decreased fine motor;decreased gross motor Cervical / Trunk Assessment Cervical / Trunk Assessment: Normal     Mobility Bed Mobility Bed Mobility: Not assessed Supine to Sit: 5: Supervision;HOB elevated Sitting - Scoot to Edge of Bed: 2: Max assist Details for Bed Mobility Assistance: pt only able to sit up without A if HOB is elevated past 45degrees.  pt does not have the strength in  his UEs to A with bed mobility and scooting to EOB.   Transfers Transfers: Not assessed Sit to Stand: 3: Mod assist;From bed Stand to Sit: 4: Min assist;To chair/3-in-1 Details for Transfer Assistance: pt attempts to use UEs, but lacks strength to A with transfers.       Exercise Other Exercises Other Exercises: pad to pad bil UE - pt with deficits LT UE with 2nd and 3rd digit. Pt with deficits on RT UE 2nd digit Other Exercises: digit elevation on flat surface Other Exercises: digit abduction on flat surface Other Exercises: flexion/ extension of digits (MCP)   Balance Balance Balance Assessed: Yes Static Standing Balance Static Standing - Balance Support: No upper extremity supported Static Standing - Level of Assistance: 4: Min assist   End of Session OT - End of Session Activity Tolerance: Patient tolerated treatment well Patient left: in chair;with call bell/phone within reach Nurse Communication: Mobility status;Precautions  GO     [name] 07/31/2013, 9:19 AM Pager: [phone]

## 2013-07-31 NOTE — Progress Notes (Signed)
Patient doing alright, have a lot of concern about having weaker grips in the right hand which is progressing. Reassured him and told him MD will see him later. Will continue to monitor.

## 2013-07-31 NOTE — Progress Notes (Signed)
Utilization review completed. Wilver Tignor, RN, BSN. 

## 2013-07-31 NOTE — Progress Notes (Signed)
CSW received referral for SNF. CSW went into room and introduced self and explained reason for visit to patient and patient's wife by bedside. CSW explained the SNF process to patient and patient's wife. Patient states he does not want to go to a SNF and wants to go home with Home Health. Patient expressed that his wife is in the profession and is able to care for him at home. CSW referred this to the CM who will follow up with patient. CSW signing off. Please consult if social work needs arise.  Maree Krabbe, MSW, Theresia Majors [phone]

## 2013-07-31 NOTE — Evaluation (Signed)
Physical Therapy Evaluation Patient Details Name: Valor Quaintance MRN: [id number] DOB: 1950-10-16 Today's Date: 07/31/2013 Time: 7829-5621 PT Time Calculation (min): 26 min  PT Assessment / Plan / Recommendation History of Present Illness  pt presents with R sided weakness now s/p C5-6 Corpectomy and C4-7 ACDF with worsening of R sided weakness and now involving L UE weakness.    Clinical Impression  Pt very motivated to improve mobility, however indicates R sided weakness worse and now involving L UE.  RN made aware of new deficits.  Feel pt would benefit from CIR at D/C to maximize independence and return to home and work.  Will continue to follow.      PT Assessment  Patient needs continued PT services    Follow Up Recommendations  CIR    Does the patient have the potential to tolerate intense rehabilitation      Barriers to Discharge        Equipment Recommendations   (TBD)    Recommendations for Other Services Rehab consult   Frequency Min 5X/week    Precautions / Restrictions Precautions Precautions: Cervical;Fall Restrictions Weight Bearing Restrictions: No   Pertinent Vitals/Pain Indicates pain as minimal.        Mobility  Bed Mobility Bed Mobility: Supine to Sit;Sitting - Scoot to Edge of Bed Supine to Sit: 5: Supervision;HOB elevated Sitting - Scoot to [employer] of Bed: 2: Max assist Details for Bed Mobility Assistance: pt only able to sit up without A if HOB is elevated past 45degrees.  pt does not have the strength in his UEs to A with bed mobility and scooting to EOB.   Transfers Transfers: Sit to Stand;Stand to Sit Sit to Stand: 3: Mod assist;From bed Stand to Sit: 4: Min assist;To chair/3-in-1 Details for Transfer Assistance: pt attempts to use UEs, but lacks strength to A with transfers.   Ambulation/Gait Ambulation/Gait Assistance: 4: Min assist Ambulation Distance (Feet): 100 Feet Assistive device: 1 person hand held assist Ambulation/Gait  Assistance Details: pt with R foot drop and decrease hip/knee flexion.  pt indicates he has been dragging his foot for a while, but feels this is worse than prior to surgery.   Gait Pattern: Step-through pattern;Decreased step length - left;Decreased stance time - right;Decreased stride length;Decreased hip/knee flexion - right;Decreased dorsiflexion - right;Decreased weight shift to right Stairs: No Wheelchair Mobility Wheelchair Mobility: No    Exercises     PT Diagnosis: Difficulty walking  PT Problem List: Decreased strength;Decreased range of motion;Decreased activity tolerance;Decreased balance;Decreased mobility;Decreased coordination;Decreased knowledge of use of DME;Impaired sensation PT Treatment Interventions: DME instruction;Gait training;Stair training;Functional mobility training;Therapeutic activities;Therapeutic exercise;Balance training;Neuromuscular re-education;Patient/family education     PT Goals(Current goals can be found in the care plan section) Acute Rehab PT Goals Patient Stated Goal: Move normally again.   PT Goal Formulation: With patient Time For Goal Achievement: 08/14/13 Potential to Achieve Goals: Good  Visit Information  Last PT Received On: 07/31/13 Assistance Needed: +1 History of Present Illness: pt presents with R sided weakness now s/p C5-6 Corpectomy and C4-7 ACDF with worsening of R sided weakness and now involving L UE weakness.         Prior Functioning  Home Living Family/patient expects to be discharged to:: Unsure Living Arrangements: Spouse/significant other Available Help at Discharge: Family;Available 24 hours/day Type of Home: House Home Access: Stairs to enter [employer] of Steps: 1 Entrance Stairs-Rails: None Home Layout: One level Home Equipment: None Prior Function Level of Independence: Independent Comments: pt works as  Hydrologist.   Communication Communication: No difficulties Dominant Hand: Right     Cognition  Cognition Arousal/Alertness: Awake/alert Behavior During Therapy: WFL for tasks assessed/performed Overall Cognitive Status: Within Functional Limits for tasks assessed    Extremity/Trunk Assessment Upper Extremity Assessment Upper Extremity Assessment: Defer to OT evaluation Lower Extremity Assessment Lower Extremity Assessment: RLE deficits/detail RLE Deficits / Details: R foot drop with strength ankle/foot 3-/5, knee 4-/5 and hip 4/5.   RLE Sensation: decreased light touch;decreased proprioception RLE Coordination: decreased fine motor;decreased gross motor Cervical / Trunk Assessment Cervical / Trunk Assessment: Normal   Balance Balance Balance Assessed: Yes Static Standing Balance Static Standing - Balance Support: No upper extremity supported Static Standing - Level of Assistance: 4: Min assist  End of Session PT - End of Session Equipment Utilized During Treatment: Gait belt Activity Tolerance: Patient tolerated treatment well Patient left: in chair;with call bell/phone within reach (with OT) Nurse Communication: Mobility status  GP     RitenourAlison Murray, [place] [phone] 07/31/2013, 8:32 AM

## 2013-07-31 NOTE — Progress Notes (Signed)
Rehab Admissions Coordinator Note:  Patient was screened by Trish Mage for appropriateness for an Inpatient Acute Rehab Consult.  I spoke with reviewer for [facility] on site.  Doubt that we could get approval for acute inpatient rehab admission.  At this time, we are recommending Skilled Nursing Facility.  Trish Mage 07/31/2013, 9:52 AM  I can be reached at [phone].

## 2013-07-31 NOTE — Progress Notes (Signed)
Pt unable to void on 2 attempts made. Only produced ~50cc urine. Bladder scan obtained 600-900 cc. Straight cath inserted, obtained 955cc clear yellow urine out. Toelrated well, no adverse event.

## 2013-07-31 NOTE — Progress Notes (Signed)
Patient ID: Peter [name], male   DOB: 1951/06/27, 62 y.o.   MRN: [id number] BP 147/79  Pulse 90  Temp(Src) 99.1 F (37.3 C) (Oral)  Resp 18  Ht 6\' 1"  (1.854 m)  Wt 106.595 kg (235 lb)  BMI 31.01 kg/m2  SpO2 99% Alert and oriented x 4, speech is clear, voice is strong Weak in right and left upper extremities Right biceps 2/5, as are the intrinsics Left side ~4/5 throughout Moving lower extremities at baseline, 4/5 left, 4-/5 on right  Will give a bolus of decadron Foley to remain in place at least another 24 hours from today.  Wound is clean, dry, no signs of infection.

## 2013-08-01 DIAGNOSIS — M4712 Other spondylosis with myelopathy, cervical region: Secondary | ICD-10-CM | POA: Diagnosis present

## 2013-08-01 MED ORDER — HYDROCODONE-ACETAMINOPHEN 5-325 MG PO TABS: 1.0000 | ORAL_TABLET | Freq: Four times a day (QID) | ORAL | Status: DC | PRN

## 2013-08-01 MED ORDER — CYCLOBENZAPRINE HCL 5 MG PO TABS: 5.0000 mg | ORAL_TABLET | Freq: Three times a day (TID) | ORAL | Status: DC | PRN

## 2013-08-01 NOTE — Progress Notes (Signed)
Occupational Therapy Treatment Patient Details Name: Peter [name] MRN: [id number] DOB: 24-Oct-1950 Today's Date: 08/01/2013 Time: 4098-1191 OT Time Calculation (min): 34 min  OT Assessment / Plan / Recommendation  History of present illness pt presents with R sided weakness now s/p C5-6 Corpectomy and C4-7 ACDF with worsening of R sided weakness and now involving L UE weakness.     OT comments  Pt progressing this session and able to complete self feeding with AE setup environment. Pt currently missing AE utensil and RN Riley Lam has called kitchen to locate missing item. Pt feels that he and his wife can manage at home at current level.   Follow Up Recommendations  Home health OT;Supervision/Assistance - 24 hour (needs home therapist for HANDS)    Barriers to Discharge       Equipment Recommendations  None recommended by OT    Recommendations for Other Services    Frequency Min 2X/week   Progress towards OT Goals Progress towards OT goals: Progressing toward goals  Plan Discharge plan needs to be updated    Precautions / Restrictions Precautions Precautions: Cervical;Fall   Pertinent Vitals/Pain No pain  Pt describes as "its all range of motion problems"    ADL  Eating/Feeding: Set up Where Assessed - Eating/Feeding: Chair (with AE) Grooming: Wash/dry face;Set up Where Assessed - Grooming: Supported sitting Toilet Transfer: Moderate assistance Toilet Transfer Method: Sit to stand Toilet Transfer Equipment: Raised toilet seat with arms (or 3-in-1 over toilet) Equipment Used: Gait belt Transfers/Ambulation Related to ADLs: Pt completed supine<>sit EOB. Pt needed (A) to achieve static standing. pt ambulating to chair level. ADL Comments: Pt supine on arrival and reports feeling better today than yesterday. Pt was able to return demonstrate hand HEP (pad to pad, adduction/ abduction digits, extension MCP) Pt with incr AROM and pad to pad on LT UE. pt reports lots of tingling  in LT UE in the PM hours but nothing in the RT UE. Pt describes RT UE as numb ( "its just dead"). Pt completed bed mobility exiting the bed on the left with bed rails and HOB > 20 degress. Pt static sitting independently. Pt completed sit<>stand mod (A) . pt with small narrowed base of support to transfer ~4 steps away to chair. pt demonstrates balance deficits. Pt with questions about home health and requesting our recommendation change with denial of CIR. Pt feels that wife and patient can manage at currently level at home. pt demonstrates use of Iphone with stylis to call wife. Wife informed by OT of changes to recommendation. Pt has a plate guard and built up utensil. the spoon built up utensil was misplaced on dinner tray and currently missing. Pt provided blue foam handle temporary until the spoon can be located in the kitchen. OT to check Saturday 08/02/13 for utensils and HEP for BIL UE    OT Diagnosis:    OT Problem List:   OT Treatment Interventions:     OT Goals(current goals can now be found in the care plan section) Acute Rehab OT Goals Patient Stated Goal: Move normally again.   OT Goal Formulation: With patient Time For Goal Achievement: 08/14/13 Potential to Achieve Goals: Good ADL Goals Pt Will Perform Eating: with set-up;with adaptive utensils;sitting Pt Will Perform Grooming: with set-up;sitting Pt Will Perform Upper Body Bathing: with set-up;sitting Pt Will Perform Lower Body Bathing: with set-up;sit to/from stand Pt Will Transfer to Toilet: with min assist;bedside commode  Visit Information  Last OT Received On: 07/31/13 Assistance Needed: +1  History of Present Illness: pt presents with R sided weakness now s/p C5-6 Corpectomy and C4-7 ACDF with worsening of R sided weakness and now involving L UE weakness.      Subjective Data      Prior Functioning       Cognition  Cognition Arousal/Alertness: Awake/alert Behavior During Therapy: WFL for tasks  assessed/performed Overall Cognitive Status: Within Functional Limits for tasks assessed    Mobility  Bed Mobility Bed Mobility: Supine to Sit;Rolling Left;Left Sidelying to Sit;Sitting - Scoot to Edge of Bed Rolling Left: 6: Modified independent (Device/Increase time);With rail Left Sidelying to Sit: 6: Modified independent (Device/Increase time);HOB elevated;With rails Supine to Sit: 6: Modified independent (Device/Increase time);With rails;HOB elevated Sitting - Scoot to Edge of Bed: 6: Modified independent (Device/Increase time);With rail Details for Bed Mobility Assistance: pt requires the use of the rails to help progress to EOB. Pt with HOB elevated. Transfers Transfers: Sit to Stand;Stand to Sit Sit to Stand: 3: Mod assist;With upper extremity assist;From bed Stand to Sit: 4: Min assist;With upper extremity assist;To chair/3-in-1 Details for Transfer Assistance: Pt is able to control descend to chair with bil UE . Pt requires (A) to achieve static standing    Exercises      Balance     End of Session OT - End of Session Activity Tolerance: Patient tolerated treatment well Patient left: in chair;with call bell/phone within reach Nurse Communication: Mobility status;Precautions  GO     [name] 08/01/2013, 9:27 AM Pager: [phone]

## 2013-08-01 NOTE — Progress Notes (Signed)
Pt continues to c/o predominately RUE weakness. Pt feels he has become progressively weaker from earlier in shift. Will continue to monitor and pass along to day shift.

## 2013-08-01 NOTE — Progress Notes (Signed)
Physical Therapy Treatment Patient Details Name: Peter [name] MRN: [id number] DOB: [birth year] Today's Date: 08/01/2013 Time: 4098-1191 PT Time Calculation (min): 42 min  PT Assessment / Plan / Recommendation  History of Present Illness pt presents with R sided weakness now s/p C5-6 Corpectomy and C4-7 ACDF with worsening of R sided weakness and now involving L UE weakness.     PT Comments   Pt very pleasant and entertaining with rapid improvement from evaluation. 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 Pt educated for transfers and gait and agrees that wife can provide transportation for OPPT to maximize function.   Follow Up Recommendations  Outpatient PT     Does the patient have the potential to tolerate intense rehabilitation     Barriers to Discharge        Equipment Recommendations  Rolling walker with 5" wheels    Recommendations for Other Services    Frequency     Progress towards PT Goals Progress towards PT goals: Progressing toward goals  Plan Discharge plan needs to be updated    Precautions / Restrictions Precautions Precautions: Cervical;Fall   Pertinent Vitals/Pain No pain   Mobility  Bed Mobility Bed Mobility: Rolling Right;Right Sidelying to Sit;Sitting - Scoot to Edge of Bed Rolling Right: 5: Supervision Right Sidelying to Sit: 5: Supervision Sitting - Scoot to Edge of Bed: 6: Modified independent (Device/Increase time) Details for Bed Mobility Assistance: cues for sequence to maintain precautions Transfers Sit to Stand: 4: Min guard;From bed;From chair/3-in-1 Stand to Sit: 4: Min guard;To chair/3-in-1;To bed Details for Transfer Assistance: pt with cues for sequence to scoot to edge of surface and for hand placement. First trial pt struggled to achieve elevation but with repeated trials performed with ease. Cues to control descent x 3 trials Ambulation/Gait Ambulation/Gait Assistance: 5:  Supervision Ambulation Distance (Feet): 500 Feet Assistive device: Rolling walker Ambulation/Gait Assistance Details: cueing for posture, position in RW and to avoid obstacles as pt catching wheel of RW on obstacles x 2 Gait Pattern: Step-through pattern;Decreased stride length;Decreased dorsiflexion - right Gait velocity: decreased Stairs: Yes Stairs Assistance: 5: Supervision Stairs Assistance Details (indicate cue type and reason): cueing for sequence Stair Management Technique: Backwards;With walker Number of Stairs: 1    Exercises     PT Diagnosis:    PT Problem List:   PT Treatment Interventions:     PT Goals (current goals can now be found in the care plan section)    Visit Information  Last PT Received On: 08/01/13 Assistance Needed: +1 History of Present Illness: pt presents with R sided weakness now s/p C5-6 Corpectomy and C4-7 ACDF with worsening of R sided weakness and now involving L UE weakness.      Subjective Data      Cognition  Cognition Arousal/Alertness: Awake/alert Behavior During Therapy: WFL for tasks assessed/performed Overall Cognitive Status: Within Functional Limits for tasks assessed    Balance     End of Session PT - End of Session Equipment Utilized During Treatment: Gait belt Activity Tolerance: Patient tolerated treatment well Patient left: in chair;with call bell/phone within reach   GP     Delorse Lek 08/01/2013, 2:12 PM Delaney Meigs, PT [phone]

## 2013-08-01 NOTE — Progress Notes (Signed)
Pt unable to void significant amount x2. Foley replaced per MD. Will continue to monitor.

## 2013-08-01 NOTE — Care Management Note (Unsigned)
    Page 1 of 2   08/04/2013     11:48:05 AM   CARE MANAGEMENT NOTE 08/04/2013  Patient:  Peter [name], Peter [name]   Account Number:  [id number]  Date Initiated:  08/01/2013  Documentation initiated by:  Elmer Bales  Subjective/Objective Assessment:   Patient admitted C5-6 corpectomy     Action/Plan:   Will follow for discharge needs   Anticipated DC Date:     Anticipated DC Plan:        DC Planning Services  CM consult  OP Neuro Rehab      Choice offered to / List presented to:  C-1 Patient   DME arranged  Levan Hurst      DME agency  [facility]        Status of service:  Completed, signed off Medicare Important Message given?   (If response is "NO", the following Medicare IM given date fields will be blank) Date Medicare IM given:   Date Additional Medicare IM given:    Discharge Disposition:    Per UR Regulation:  Reviewed for med. necessity/level of care/duration of stay  If discussed at Long Length of Stay Meetings, dates discussed:    Comments:  08/04/13 1130 Elmer Bales RN, MSN, CM- Per sticky note response from Dr Franky Macho, patient is ok to have outpatient therapy at discharge. CM met with patient and wife to discuss outpatient therapy locations.  Patient prefers the [facility].  Orders were faxed.  [facility] was notified of order for rolling walker, possible discharge today or tomorrow.   08/01/13 1530 Elmer Bales RN, MSN, CM- PT recommendations for outpatient therapy were noted. Sticky note was left for Dr Franky Macho regarding his preference for therapy after discharge.  Patient's RN aware.

## 2013-08-02 NOTE — Progress Notes (Signed)
Subjective: Patient reports A large gallop collar is giving him problems. It is unclear to me why he was put in a collar so therefore I do not the carpal discontinuing collar. He was admitted with myelopathy underwent ACDF and has significant disability and weakness in his arms and legs and it has been improving since surgery slowly but  Objective: Vital signs in last 24 hours: Temp:  [97.6 F (36.4 C)-98.6 F (37 C)] 97.9 F (36.6 C) (11/08 0527) Pulse Rate:  [83-106] 87 (11/08 0527) Resp:  [18-20] 20 (11/08 0527) BP: (146-162)/(77-90) 162/87 mmHg (11/08 0527) SpO2:  [98 %-100 %] 99 % (11/08 0527)  Intake/Output from previous day: 11/07 0701 - 11/08 0700 In: 720 [P.O.:720] Out: 2500 [Urine:2500] Intake/Output this shift:    Lower surgery and appears to be 4-4+ out of 5 upper extremity strength deltoids and biceps are very weak at about 2/5 triceps appear to be 4/5 and intrinsics are 2-3/5  Lab Results: No results found for this basename: WBC, HGB, HCT, PLT,  in the last 72 hours BMET No results found for this basename: NA, K, CL, CO2, GLUCOSE, BUN, CREATININE, CALCIUM,  in the last 72 hours  Studies/Results: No results found.  Assessment/Plan: Progressive mobilization continued physical occupational therapy continue cervical collar for now  LOS: 3 days     Taylia Berber P 08/02/2013, 9:02 AM

## 2013-08-02 NOTE — Progress Notes (Signed)
Physical Therapy Treatment Patient Details Name: Peter [name] MRN: [id number] DOB: 16-Apr-1951 Today's Date: 08/02/2013 Time: 4098-1191 PT Time Calculation (min): 31 min  PT Assessment / Plan / Recommendation  History of Present Illness pt presents with R sided weakness now s/p C5-6 Corpectomy and C4-7 ACDF with worsening of R sided weakness and now involving L UE weakness.     PT Comments   Pt much improved today in overall strength and activity tolerance.  Pt continues to require RW for stability and continues with R LE deficits.  Will continue to follow.    Follow Up Recommendations  Outpatient PT     Does the patient have the potential to tolerate intense rehabilitation     Barriers to Discharge        Equipment Recommendations  Rolling walker with 5" wheels    Recommendations for Other Services    Frequency Min 5X/week   Progress towards PT Goals Progress towards PT goals: Progressing toward goals  Plan Current plan remains appropriate    Precautions / Restrictions Precautions Precautions: Cervical;Fall Precaution Comments: pt now with hard c-collar which pt states he is supposed to be wearing, however he took it off because he felt it was choking him.   Required Braces or Orthoses: Cervical Brace Cervical Brace: Hard collar (During mobility at this time.  ) Restrictions Weight Bearing Restrictions: No   Pertinent Vitals/Pain Pt indicates pain is "managable".      Mobility  Bed Mobility Bed Mobility: Rolling Right;Right Sidelying to Sit;Sitting - Scoot to Edge of Bed Rolling Right: 5: Supervision;With rail Right Sidelying to Sit: 5: Supervision;With rails;HOB elevated Sitting - Scoot to Edge of Bed: 6: Modified independent (Device/Increase time) Details for Bed Mobility Assistance: pt needs increased time to complete.   Transfers Transfers: Sit to Stand;Stand to Sit Sit to Stand: 4: Min guard;From bed;With upper extremity assist Stand to Sit: 4: Min guard;With  upper extremity assist;To chair/3-in-1 Details for Transfer Assistance: cues for use of UEs and anterior wt shift with sit to stand.  cueing to control descent to chair.   Ambulation/Gait Ambulation/Gait Assistance: 5: Supervision Ambulation Distance (Feet): 500 Feet Assistive device: Rolling walker Ambulation/Gait Assistance Details: pt continues to need cues for upright posture and positioning within RW, especially with turns.  pt needed cues for negotiating obstacles in hallway as pt caught wheels of RW x1 and nearly did a second time.   Gait Pattern: Step-through pattern;Decreased stride length;Decreased dorsiflexion - right Stairs: No Wheelchair Mobility Wheelchair Mobility: No    Exercises     PT Diagnosis:    PT Problem List:   PT Treatment Interventions:     PT Goals (current goals can now be found in the care plan section) Acute Rehab PT Goals Patient Stated Goal: Move normally again.   Time For Goal Achievement: 08/14/13 Potential to Achieve Goals: Good  Visit Information  Last PT Received On: 08/02/13 Assistance Needed: +1 History of Present Illness: pt presents with R sided weakness now s/p C5-6 Corpectomy and C4-7 ACDF with worsening of R sided weakness and now involving L UE weakness.      Subjective Data  Patient Stated Goal: Move normally again.     Cognition  Cognition Arousal/Alertness: Awake/alert Behavior During Therapy: WFL for tasks assessed/performed Overall Cognitive Status: Within Functional Limits for tasks assessed    Balance  Balance Balance Assessed: No  End of Session PT - End of Session Equipment Utilized During Treatment: Gait belt Activity Tolerance: Patient tolerated treatment well  Patient left: in chair;with call bell/phone within reach Nurse Communication: Mobility status   GP     [name], [place] [phone] 08/02/2013, 1:51 PM

## 2013-08-02 NOTE — Progress Notes (Signed)
Patient c/o having panic attacks while aspen collar on, He requested to be removed for an hour or rotating every hour since he feels he is choking and not breathing well with the collar on.

## 2013-08-03 NOTE — Progress Notes (Signed)
PT Cancellation Note  Patient Details Name: Peter [name] MRN: [id number] DOB: [birth year]   Cancelled Treatment:    Reason Eval/Treat Not Completed: Pain limiting ability to participate. Pt states he has had pain medication.  Will f/u tomorrow.      Verdell Face, [place] [phone] 08/03/2013

## 2013-08-03 NOTE — Progress Notes (Signed)
Subjective: Patient reports Patient doing well improved upper extremity strength ambulatory yesterday and also physical therapy to  Objective: Vital signs in last 24 hours: Temp:  [97.3 F (36.3 C)-98.2 F (36.8 C)] 97.3 F (36.3 C) (11/09 0534) Pulse Rate:  [74-90] 85 (11/09 0534) Resp:  [18-20] 18 (11/09 0534) BP: (95-151)/(61-87) 151/87 mmHg (11/09 0534) SpO2:  [94 %-100 %] 97 % (11/09 0534)  Intake/Output from previous day:   Intake/Output this shift:    45 bicep 4 minus out of 5 in intrinsics and triceps 2/5 deltoid lower 70s 5 out of 5  Lab Results: No results found for this basename: WBC, HGB, HCT, PLT,  in the last 72 hours BMET No results found for this basename: NA, K, CL, CO2, GLUCOSE, BUN, CREATININE, CALCIUM,  in the last 72 hours  Studies/Results: No results found.  Assessment/Plan: Continue to mobilize with physical therapy consider rehabilitation placement  LOS: 4 days     Peter [name] P 08/03/2013, 9:03 AM

## 2013-08-04 NOTE — Progress Notes (Signed)
Physical Therapy Treatment Patient Details Name: Peter [name] MRN: [id number] DOB: [birth year] Today's Date: 08/04/2013 Time: 6295-2841 PT Time Calculation (min): 29 min  PT Assessment / Plan / Recommendation  History of Present Illness pt presents with R sided weakness now s/p C5-6 Corpectomy and C4-7 ACDF with worsening of R sided weakness and now involving L UE weakness.     PT Comments   Pt contniues to demo improved mobility and safety, though still needing cueing for safe techniques.  Pt continues to have decreased coordination with all mobility and would benefit from continued therapies.    Follow Up Recommendations  Outpatient PT     Does the patient have the potential to tolerate intense rehabilitation     Barriers to Discharge        Equipment Recommendations  Rolling walker with 5" wheels    Recommendations for Other Services    Frequency Min 5X/week   Progress towards PT Goals Progress towards PT goals: Progressing toward goals  Plan Current plan remains appropriate    Precautions / Restrictions Precautions Precautions: Cervical;Fall Precaution Comments: pt now with hard c-collar which pt states he is supposed to be wearing, However pt states he can not wear it for more than 2 hours. Pt educated on the purpose of the ccollar and safe positioning. Pt reinforced cervical precautions Required Braces or Orthoses: Cervical Brace Cervical Brace: Hard collar;At all times Restrictions Weight Bearing Restrictions: No   Pertinent Vitals/Pain "Not much".      Mobility  Bed Mobility Bed Mobility: Not assessed Supine to Sit: 5: Supervision;With rails;HOB flat Sitting - Scoot to Edge of Bed: 5: Supervision;With rail Sit to Supine: 4: Min assist;HOB flat;With rail Details for Bed Mobility Assistance: pt educated on safety and rolling like a log for safety with cervical precautions. Pt needed (A) to controlled descend to sitting. Transfers Transfers: Sit to Stand;Stand  to Sit Sit to Stand: 4: Min assist;With upper extremity assist;From chair/3-in-1;With armrests Stand to Sit: 4: Min guard;With upper extremity assist;To chair/3-in-1;With armrests Details for Transfer Assistance: pt needs (A) and verbal cues to control descend. Pt with sudden descend to sitting that is unsafe. Pt needs extended time and effort to complete static stand.  Ambulation/Gait Ambulation/Gait Assistance: 5: Supervision Ambulation Distance (Feet): 700 Feet Assistive device: Rolling walker Ambulation/Gait Assistance Details: pt continues with decreased DF on R LE, though this has improved since eval.  pt demos better safety with RW today.   Gait Pattern: Step-through pattern;Decreased stride length;Decreased dorsiflexion - right Stairs: No Wheelchair Mobility Wheelchair Mobility: No    Exercises Other Exercises Other Exercises: see adl session   PT Diagnosis:    PT Problem List:   PT Treatment Interventions:     PT Goals (current goals can now be found in the care plan section) Acute Rehab PT Goals Patient Stated Goal: Move normally again.   Time For Goal Achievement: 08/14/13 Potential to Achieve Goals: Good  Visit Information  Last PT Received On: 08/04/13 Assistance Needed: +1 History of Present Illness: pt presents with R sided weakness now s/p C5-6 Corpectomy and C4-7 ACDF with worsening of R sided weakness and now involving L UE weakness.      Subjective Data  Patient Stated Goal: Move normally again.     Cognition  Cognition Arousal/Alertness: Awake/alert Behavior During Therapy: WFL for tasks assessed/performed Overall Cognitive Status: Within Functional Limits for tasks assessed    Balance  Balance Balance Assessed: No  End of Session PT - End of Session  Equipment Utilized During Treatment: Gait belt Activity Tolerance: Patient tolerated treatment well Patient left: in chair;with call bell/phone within reach;with family/visitor present Nurse  Communication: Mobility status   GP     Peter [name], [place] [phone] 08/04/2013, 2:18 PM

## 2013-08-04 NOTE — Progress Notes (Signed)
Occupational Therapy Treatment Patient Details Name: Peter [name] MRN: [id number] DOB: 1950-11-25 Today's Date: 08/04/2013 Time: 4098-1191 OT Time Calculation (min): 58 min  OT Assessment / Plan / Recommendation  History of present illness pt presents with R sided weakness now s/p C5-6 Corpectomy and C4-7 ACDF with worsening of R sided weakness and now involving L UE weakness.     OT comments  Pt continues to progress with OT at this time. Pt provided new UE HEP program exercises, yellow putty and blue squeeze ball. Pt demonstrates improvements in BIL UE since evaluation but remains with deficits affecting all ADLs. Pt requires AE for self feeding. Pt now with build up vibrating tooth brush to help with oral care.    Follow Up Recommendations  Outpatient OT;Supervision - Intermittent    Barriers to Discharge       Equipment Recommendations  Other (comment) (RW)    Recommendations for Other Services    Frequency Min 2X/week   Progress towards OT Goals Progress towards OT goals: Progressing toward goals  Plan Discharge plan remains appropriate    Precautions / Restrictions Precautions Precautions: Cervical;Fall Precaution Comments: pt now with hard c-collar which pt states he is supposed to be wearing, However pt states he can not wear it for more than 2 hours. Pt educated on the purpose of the ccollar and safe positioning. Pt reinforced cervical precautions Required Braces or Orthoses: Cervical Brace Cervical Brace: Hard collar;At all times   Pertinent Vitals/Pain Fatigue s/p exercises due to effort required Pt reports no pain only motor deficits    ADL  Eating/Feeding: Other (comment) (blue foam present in room/ plate guard) ADL Comments: This session focused on BIL UE HEP. Pt with 100% recall of hand exercises taught in previous session. pt demonstrates incr abduction and pad to pad movement compared to evaluation. Pt however remains with a median nerve distribution  deficit on LT UE. Pt with 2nd digit extension and required extensive effort to attempt flexion. Pt is unable to hold all digits in flexion. Pt demonstrates shoulder AROM ~90 degrees with slight abduction. Pt LT UE with decr grasp and fine motor. Pt with Rt UE decr sensation and motor control. Pt moving with a rigid jerking motion. Pt positioned in supine for AAROM exercises. Pt educated on supine in bed with RT UE shoulder flexion ~90 degrees or less elbow extension protraction / retraction scapular exercises ( punching the ceiling) using LT UE as assist. Pt educated on controlled elbow flexion to attempt to touch head and then extend elbow. Pt educated on supination / pronation. Pt is to repeat exercises for the LT UE. Pt educated on table top guilding exercises ( elbow flexion / extension, shoulder abduction/ adduction, digit flexion / extension and circular motion of the shoulder joint. Pt provided yellow putty and blue squeeze ball with x3 handouts. Pt and wife educated that the handouts have more information than should be completed in a single session. Pt has a complete HEP program that will be incr gradually by outpatient therapy. Pt is to complete previous exercises plus new education this session. Pt educated on functional home task that relate to our education today. Pt with good return and active participation.     OT Diagnosis:    OT Problem List:   OT Treatment Interventions:     OT Goals(current goals can now be found in the care plan section) Acute Rehab OT Goals Patient Stated Goal: Move normally again.   OT Goal Formulation: With patient Time  For Goal Achievement: 08/14/13 Potential to Achieve Goals: Good ADL Goals Pt Will Perform Eating: with set-up;with adaptive utensils;sitting Pt Will Perform Grooming: with set-up;sitting Pt Will Perform Upper Body Bathing: with set-up;sitting Pt Will Perform Lower Body Bathing: with set-up;sit to/from stand Pt Will Transfer to Toilet: with min  assist;bedside commode  Visit Information  Last OT Received On: 08/04/13 Assistance Needed: +1 History of Present Illness: pt presents with R sided weakness now s/p C5-6 Corpectomy and C4-7 ACDF with worsening of R sided weakness and now involving L UE weakness.      Subjective Data      Prior Functioning       Cognition  Cognition Arousal/Alertness: Awake/alert Behavior During Therapy: WFL for tasks assessed/performed Overall Cognitive Status: Within Functional Limits for tasks assessed    Mobility  Bed Mobility Supine to Sit: 5: Supervision;With rails;HOB flat Sitting - Scoot to Edge of Bed: 5: Supervision;With rail Sit to Supine: 4: Min assist;HOB flat;With rail Details for Bed Mobility Assistance: pt educated on safety and rolling like a log for safety with cervical precautions. Pt needed (A) to controlled descend to sitting. Transfers Transfers: Sit to Stand;Stand to Sit Sit to Stand: 4: Min assist;With upper extremity assist;From bed Stand to Sit: 4: Min guard;With upper extremity assist;To chair/3-in-1 Details for Transfer Assistance: pt needs (A) and verbal cues to control descend. Pt with sudden descend to sitting that is unsafe. Pt needs extended time and effort to complete static stand.     Exercises  Other Exercises Other Exercises: see adl session   Balance     End of Session OT - End of Session Activity Tolerance: Patient tolerated treatment well Patient left: in chair;with call bell/phone within reach;with family/visitor present Nurse Communication: Mobility status;Precautions  GO     [name] 08/04/2013, 1:38 PM Pager: [phone]

## 2013-08-04 NOTE — Progress Notes (Signed)
Patient ID: Sanchez Hemmer, male   DOB: Jan 10, 1951, 62 y.o.   MRN: [id number] BP 100/53  Pulse 77  Temp(Src) 97.6 F (36.4 C) (Oral)  Resp 20  Ht 6\' 1"  (1.854 m)  Wt 106.595 kg (235 lb)  BMI 31.01 kg/m2  SpO2 95% Alert and oriented x 4 Speech is clear and fluent Moving upper extremities better than on Friday Grip is much better in right hand, 4/5 Wound is clean, dry, and without signs of infection

## 2013-08-05 NOTE — Progress Notes (Signed)
PT Cancellation Note  Patient Details Name: Ledell Codrington MRN: [id number] DOB: August 12, 1951   Cancelled Treatment:    Reason Eval/Treat Not Completed: Pain limiting ability to participate.  Attempted PT x2 today with pt very painful both times and declining mobility.  Will f/u tomorrow.     [name], [place] [phone] 08/05/2013, 1:52 PM

## 2013-08-05 NOTE — Progress Notes (Signed)
Patient ID: Peter [name], male   DOB: 05-07-1951, 62 y.o.   MRN: [id number] BP 113/75  Pulse 96  Temp(Src) 98.6 F (37 C) (Oral)  Resp 18  Ht 6\' 1"  (1.854 m)  Wt 106.595 kg (235 lb)  BMI 31.01 kg/m2  SpO2 92% Alert and oriented x 4 Increased strength in upper extremities Progressing well Continue pt and ot

## 2013-08-05 NOTE — Progress Notes (Signed)
UR complete.  Myles Tavella RN, MSN 

## 2013-08-06 NOTE — Progress Notes (Signed)
Physical Therapy Treatment Patient Details Name: Peter [name] MRN: [id number] DOB: 12/03/1950 Today's Date: 08/06/2013 Time: 1191-4782 PT Time Calculation (min): 31 min  PT Assessment / Plan / Recommendation  History of Present Illness pt presents with R sided weakness now s/p C5-6 Corpectomy and C4-7 ACDF with worsening of R sided weakness and now involving L UE weakness.     PT Comments   Pt able to demonstrate safe technique with stair negotiation and ambulation with RW with few cues today. Pt required cues with grooming activities to maintain precautions. Pt verbalized that he is ready to go home and his wife will be there to assist.  Pt will benefit from further PT to gain more functional independence.    Follow Up Recommendations  Outpatient PT     Does the patient have the potential to tolerate intense rehabilitation     Barriers to Discharge        Equipment Recommendations  Rolling walker with 5" wheels    Recommendations for Other Services Rehab consult  Frequency Min 5X/week   Progress towards PT Goals Progress towards PT goals: Progressing toward goals  Plan Current plan remains appropriate    Precautions / Restrictions Precautions Precautions: Cervical;Fall Precaution Comments: Wore hard c-collar throughout tx today and recalled all precautions Required Braces or Orthoses: Cervical Brace Cervical Brace: Hard collar;At all times Restrictions Weight Bearing Restrictions: No   Pertinent Vitals/Pain 3/10 neck pain today, pt stated that it's much better    Mobility  Bed Mobility Bed Mobility: Rolling Left;Left Sidelying to Sit;Sitting - Scoot to [place] of Bed;Sit to Sidelying Left Rolling Left: 6: Modified independent (Device/Increase time) Left Sidelying to Sit: 6: Modified independent (Device/Increase time) Sitting - Scoot to Edge of Bed: 6: Modified independent (Device/Increase time) Sit to Sidelying Left: 6: Modified independent (Device/Increase  time) Details for Bed Mobility Assistance: Pt performed with HOB flat and without rail to simulate home environment Transfers Transfers: Sit to Stand;Stand to Sit Sit to Stand: 4: Min guard;From bed;With upper extremity assist Stand to Sit: To chair/3-in-1;4: Min guard Details for Transfer Assistance: Cues for slower descent Ambulation/Gait Ambulation/Gait Assistance: 5: Supervision Ambulation Distance (Feet): 700 Feet Assistive device: Rolling walker Ambulation/Gait Assistance Details: Cues for upright posture; required one standing rest break due to fatigue; decreased RLE DF Gait Pattern: Step-through pattern;Decreased stride length;Decreased dorsiflexion - right Stairs: Yes Stairs Assistance: 4: Min assist;4: Min guard Stairs Assistance Details (indicate cue type and reason): Pt required cues to go up backwards but then was able to demonstrate safe technique and verbalize how wife will assist for safety at home Stair Management Technique: Backwards;With walker;No rails Number of Stairs: 1    Exercises     PT Diagnosis:    PT Problem List:   PT Treatment Interventions:     PT Goals (current goals can now be found in the care plan section)    Visit Information  Last PT Received On: 08/06/13 Assistance Needed: +1 History of Present Illness: pt presents with R sided weakness now s/p C5-6 Corpectomy and C4-7 ACDF with worsening of R sided weakness and now involving L UE weakness.      Subjective Data      Cognition  Cognition Arousal/Alertness: Awake/alert Behavior During Therapy: WFL for tasks assessed/performed Overall Cognitive Status: Within Functional Limits for tasks assessed    Balance     End of Session PT - End of Session Equipment Utilized During Treatment: Gait belt Activity Tolerance: Patient tolerated treatment well Patient left: in  chair;with call bell/phone within reach Nurse Communication: Mobility status   GP     Ernestina Columbia, SPTA 08/06/2013,  9:15 AM

## 2013-08-06 NOTE — Progress Notes (Signed)
Seen and agreed 08/06/2013 Robinette, Julia Elizabeth PTA [phone] pager [phone] office    

## 2013-08-06 NOTE — Progress Notes (Signed)
Occupational Therapy Treatment Patient Details Name: Peter [name] MRN: [id number] DOB: 06-Sep-1951 Today's Date: 08/06/2013 Time: 7829-5621 OT Time Calculation (min): 33 min  OT Assessment / Plan / Recommendation  History of present illness pt presents with R sided weakness now s/p C5-6 Corpectomy and C4-7 ACDF with worsening of R sided weakness and now involving L UE weakness.     OT comments  Pt with incr pain described as shoulder pain. Pt provided scapula exercises in supine to help decr pain and range shoulder. Pt tolerating well. Pt positioned in supine to help allow pt time to rest and decr pain.   Follow Up Recommendations  Outpatient OT;Supervision - Intermittent    Barriers to Discharge       Equipment Recommendations       Recommendations for Other Services    Frequency Min 2X/week   Progress towards OT Goals Progress towards OT goals: Progressing toward goals  Plan Discharge plan remains appropriate    Precautions / Restrictions Precautions Precautions: Cervical;Fall Precaution Comments: pt found in room sitting in chair without ccollar. Pt explaining several reasons why collar was not on. Pt educated by OT "the collar must be on at all times when you are in this chair" Pt states "you're right I am all out of excuses" Required Braces or Orthoses: Cervical Brace Cervical Brace: Hard collar;At all times   Pertinent Vitals/Pain 5 out 10 premedicated    ADL  ADL Comments: Session focused on UE exercises and don of brace. Pt mod (A) to don ccollar in chair. Pt completing hand exercises in chair (see below) and returned to supine due to incr fatigue from prolonged sitting.     OT Diagnosis:    OT Problem List:   OT Treatment Interventions:     OT Goals(current goals can now be found in the care plan section) Acute Rehab OT Goals Patient Stated Goal: Move normally again.   OT Goal Formulation: With patient Time For Goal Achievement: 08/14/13 Potential to  Achieve Goals: Good ADL Goals Pt Will Perform Eating: with set-up;with adaptive utensils;sitting Pt Will Perform Grooming: with set-up;sitting Pt Will Perform Upper Body Bathing: with set-up;sitting Pt Will Perform Lower Body Bathing: with set-up;sit to/from stand Pt Will Transfer to Toilet: with min assist;bedside commode  Visit Information  Last OT Received On: 08/06/13 Assistance Needed: +1 History of Present Illness: pt presents with R sided weakness now s/p C5-6 Corpectomy and C4-7 ACDF with worsening of R sided weakness and now involving L UE weakness.      Subjective Data      Prior Functioning       Cognition  Cognition Arousal/Alertness: Awake/alert Behavior During Therapy: WFL for tasks assessed/performed Overall Cognitive Status: Within Functional Limits for tasks assessed Memory: Decreased short-term memory (decr recall of precautions/ exercises)    Mobility  Bed Mobility Sit to Supine: 4: Min assist;HOB flat;With rail (cues for safety) Details for Bed Mobility Assistance: cues to keep proper body alignment and log roll to protect neck Transfers Transfers: Sit to Stand;Stand to Sit Sit to Stand: 3: Mod assist;With upper extremity assist;From chair/3-in-1 Stand to Sit: 4: Min assist;With upper extremity assist;To chair/3-in-1 Details for Transfer Assistance: pt with x3 unsuccessful attempts to static stand from chair. Pt scooting all the way to the front of chair and posteriorly pushing into standing. pt with LOB and crashing back to the chair surface. Pt needed (A) to achieve upright posture    Exercises  Other Exercises Other Exercises: demonstrated theraputty yellow- rolling  a ball, pinch with thumbs, pull apart with thumbs pinch grasp, mcp flexion dip pip flexion to squeeze Other Exercises: Demonstrates blue exercise ball- individual digit flexion, gross grasp, rolling ball in palm for palm and coordination Other Exercises: supine postion: scapula retraction/  protraction, shoulder shrug elbow flexion against gravity to touch forehead Other Exercises: Pt demonstrates inability to assist RT UE with LT UE in supine position. pt will need to have wife assistance. Pt educated on relaxing shoulders and correct body position. Pt asked to return demo and teach back method used for patient to verbalize to thearpist what to do so pt demonstrates ability to teach wife.    Balance     End of Session OT - End of Session Activity Tolerance: Patient limited by fatigue Patient left: in bed;with call bell/phone within reach Nurse Communication: Mobility status;Precautions  GO     Peter [name] 08/06/2013, 3:40 PM Pager: [phone]

## 2013-08-06 NOTE — Progress Notes (Signed)
Pt d/c instruction and follow up instruction given also given [facility] to  See a urologist in 2 days for retention of urine.  Condition at d/c is stable . Pt went home accompanied with spouse Elsie Amis and with leg bagwith foley.

## 2013-08-10 NOTE — Discharge Summary (Signed)
Physician Discharge Summary  Patient ID: Peter [name] MRN: [id number] DOB/AGE: May 09, 1951 62 y.o.  Admit date: 07/30/2013 Discharge date: 08/10/2013  Admission Diagnoses: cervical radiculopathy, Cervical spondylosis with myelopathy C4-7, cervical stenosis  Discharge Diagnoses: Same Principal Problem:   Spondylosis, cervical, with myelopathy   Discharged Condition: good  Hospital Course: Peter [name] was admitted to the hospital and taken to the operating room where he underwent C5 &C6 corpectomies, spinal canal decompression, Peek strut graft(alphatek) with morselized autograft, C4-7 arthrodesis, C4-7 anterior plating(nuvasive). Post op he had greater weakness in the right upper extremity for ~36 hours which then improved. He was voiding, tolerating a regular diet, but unable to void completely(high PVR's) thus he was sent home with a leg bag for his foley. The foley was removed on two occasions during his hospitalization. His wound was clean, dry, and without signs of infection.    Consults: None  Significant Diagnostic Studies: none  Treatments: surgery: as above  Discharge Exam: Blood pressure 121/78, pulse 84, temperature 98 F (36.7 C), temperature source Oral, resp. rate 18, height 6\' 1"  (1.854 m), weight 106.595 kg (235 lb), SpO2 95.00%. General appearance: alert, cooperative and appears stated age Baseline weakness in the upper extremities, problems with grip, intrinsics, and with coordination.  Disposition: 01-Home or Self Care  Discharge Orders   Future Orders Complete By Expires   Walker rolling  As directed        Medication List         acetaminophen 500 MG tablet  Commonly known as:  TYLENOL  Take 1,500 mg by mouth 2 (two) times daily as needed for pain.     albuterol 108 (90 BASE) MCG/ACT inhaler  Commonly known as:  PROVENTIL HFA;VENTOLIN HFA  Inhale 2 puffs into the lungs every 6 (six) hours as needed for wheezing.     aspirin EC 81 MG tablet   Take 81 mg by mouth 2 (two) times daily.     carvedilol 12.5 MG tablet  Commonly known as:  COREG  Take 12.5 mg by mouth 2 (two) times daily with a meal.     cyclobenzaprine 5 MG tablet  Commonly known as:  FLEXERIL  Take 1 tablet (5 mg total) by mouth 3 (three) times daily as needed for muscle spasms.     digoxin 0.25 MG tablet  Commonly known as:  LANOXIN  Take 1 tablet (0.25 mg total) by mouth daily.     FISH OIL PO  Take 1 capsule by mouth daily.     furosemide 20 MG tablet  Commonly known as:  LASIX  Take 20 mg by mouth daily.     GLUCOSAMINE CHONDR COMPLEX PO  Take 1 tablet by mouth daily.     HYDROcodone-acetaminophen 5-325 MG per tablet  Commonly known as:  NORCO/VICODIN  Take 1 tablet by mouth every 6 (six) hours as needed for moderate pain or severe pain.     isosorbide-hydrALAZINE 20-37.5 MG per tablet  Commonly known as:  BIDIL  Take 1 tablet by mouth 3 (three) times daily.     levothyroxine 175 MCG tablet  Commonly known as:  SYNTHROID, LEVOTHROID  Take 175 mcg by mouth daily before breakfast.     multivitamin with minerals Tabs tablet  Take 1 tablet by mouth daily.     olmesartan 20 MG tablet  Commonly known as:  BENICAR  Take 20 mg by mouth daily.     predniSONE 10 MG tablet  Commonly known as:  DELTASONE  Take  5 mg by mouth daily.     traMADol 50 MG tablet  Commonly known as:  ULTRAM  Take 50 mg by mouth every 6 (six) hours as needed for pain.     vitamin B-12 100 MCG tablet  Commonly known as:  CYANOCOBALAMIN  Take 50 mcg by mouth daily.           Follow-up Information   Follow up with Ethan Kasperski L, MD In 3 weeks. (call the office to make an appointment)    Specialty:  Neurosurgery   Contact information:   [address]                         [address] [place] [address] [phone]       Signed: Tayjah Lobdell L 08/10/2013, 7:08 PM

## 2013-08-11 ENCOUNTER — Encounter (HOSPITAL_COMMUNITY): Payer: Self-pay | Admitting: Neurosurgery

## 2013-08-13 ENCOUNTER — Emergency Department (HOSPITAL_COMMUNITY): Payer: 59

## 2013-08-13 ENCOUNTER — Encounter (HOSPITAL_COMMUNITY): Payer: Self-pay | Admitting: Emergency Medicine

## 2013-08-13 ENCOUNTER — Observation Stay (HOSPITAL_COMMUNITY)
Admission: EM | Admit: 2013-08-13 | Discharge: 2013-08-15 | Disposition: A | Discharge status: Home / Self Care | Payer: 59 | Attending: Internal Medicine | Admitting: Internal Medicine

## 2013-08-13 DIAGNOSIS — R059 Cough, unspecified: Secondary | ICD-10-CM | POA: Insufficient documentation

## 2013-08-13 DIAGNOSIS — R05 Cough: Secondary | ICD-10-CM | POA: Insufficient documentation

## 2013-08-13 DIAGNOSIS — I5041 Acute combined systolic (congestive) and diastolic (congestive) heart failure: Secondary | ICD-10-CM | POA: Diagnosis present

## 2013-08-13 DIAGNOSIS — Z7982 Long term (current) use of aspirin: Secondary | ICD-10-CM | POA: Insufficient documentation

## 2013-08-13 DIAGNOSIS — R29898 Other symptoms and signs involving the musculoskeletal system: Secondary | ICD-10-CM | POA: Insufficient documentation

## 2013-08-13 DIAGNOSIS — N289 Disorder of kidney and ureter, unspecified: Secondary | ICD-10-CM | POA: Diagnosis present

## 2013-08-13 DIAGNOSIS — R61 Generalized hyperhidrosis: Secondary | ICD-10-CM | POA: Insufficient documentation

## 2013-08-13 DIAGNOSIS — I5040 Unspecified combined systolic (congestive) and diastolic (congestive) heart failure: Secondary | ICD-10-CM | POA: Insufficient documentation

## 2013-08-13 DIAGNOSIS — Z79899 Other long term (current) drug therapy: Secondary | ICD-10-CM | POA: Insufficient documentation

## 2013-08-13 DIAGNOSIS — I509 Heart failure, unspecified: Secondary | ICD-10-CM | POA: Insufficient documentation

## 2013-08-13 DIAGNOSIS — N179 Acute kidney failure, unspecified: Secondary | ICD-10-CM

## 2013-08-13 DIAGNOSIS — E0500 Thyrotoxicosis with diffuse goiter without thyrotoxic crisis or storm: Secondary | ICD-10-CM | POA: Insufficient documentation

## 2013-08-13 DIAGNOSIS — E039 Hypothyroidism, unspecified: Secondary | ICD-10-CM | POA: Insufficient documentation

## 2013-08-13 DIAGNOSIS — R55 Syncope and collapse: Secondary | ICD-10-CM | POA: Insufficient documentation

## 2013-08-13 DIAGNOSIS — J45909 Unspecified asthma, uncomplicated: Secondary | ICD-10-CM | POA: Insufficient documentation

## 2013-08-13 DIAGNOSIS — I959 Hypotension, unspecified: Secondary | ICD-10-CM | POA: Insufficient documentation

## 2013-08-13 DIAGNOSIS — R42 Dizziness and giddiness: Principal | ICD-10-CM | POA: Insufficient documentation

## 2013-08-13 DIAGNOSIS — I10 Essential (primary) hypertension: Secondary | ICD-10-CM | POA: Insufficient documentation

## 2013-08-13 DIAGNOSIS — R339 Retention of urine, unspecified: Secondary | ICD-10-CM | POA: Insufficient documentation

## 2013-08-13 DIAGNOSIS — R82998 Other abnormal findings in urine: Secondary | ICD-10-CM | POA: Insufficient documentation

## 2013-08-13 DIAGNOSIS — R0789 Other chest pain: Secondary | ICD-10-CM | POA: Insufficient documentation

## 2013-08-13 HISTORY — DX: Disorder of kidney and ureter, unspecified: N28.9

## 2013-08-13 LAB — PROTIME-INR
INR: 0.99 (ref 0.00–1.49)
Prothrombin Time: 12.9 seconds (ref 11.6–15.2)

## 2013-08-13 LAB — URINE MICROSCOPIC-ADD ON

## 2013-08-13 LAB — URINALYSIS, ROUTINE W REFLEX MICROSCOPIC
Bilirubin Urine: NEGATIVE
Glucose, UA: NEGATIVE mg/dL
Ketones, ur: NEGATIVE mg/dL
pH: 6 (ref 5.0–8.0)

## 2013-08-13 LAB — COMPREHENSIVE METABOLIC PANEL
ALT: 15 U/L (ref 0–53)
AST: 18 U/L (ref 0–37)
Albumin: 4.1 g/dL (ref 3.5–5.2)
Alkaline Phosphatase: 99 U/L (ref 39–117)
Chloride: 95 mEq/L — ABNORMAL LOW (ref 96–112)
Creatinine, Ser: 1.68 mg/dL — ABNORMAL HIGH (ref 0.50–1.35)
Potassium: 3.8 mEq/L (ref 3.5–5.1)
Sodium: 133 mEq/L — ABNORMAL LOW (ref 135–145)
Total Bilirubin: 0.3 mg/dL (ref 0.3–1.2)
Total Protein: 7.7 g/dL (ref 6.0–8.3)

## 2013-08-13 LAB — EXPECTORATED SPUTUM ASSESSMENT W GRAM STAIN, RFLX TO RESP C: Special Requests: NORMAL

## 2013-08-13 LAB — CBC
HCT: 36.4 % — ABNORMAL LOW (ref 39.0–52.0)
MCH: 28.8 pg (ref 26.0–34.0)
Platelets: 328 10*3/uL (ref 150–400)
RBC: 4.23 MIL/uL (ref 4.22–5.81)
RDW: 13 % (ref 11.5–15.5)
WBC: 9.8 10*3/uL (ref 4.0–10.5)

## 2013-08-13 LAB — TYPE AND SCREEN: ABO/RH(D): O POS

## 2013-08-13 LAB — TROPONIN I: Troponin I: <0.3 ng/mL (ref <0.30)

## 2013-08-13 LAB — ABO/RH: ABO/RH(D): O POS

## 2013-08-13 MED ORDER — LEVOTHYROXINE SODIUM 50 MCG PO TABS
50.0000 ug | ORAL_TABLET | Freq: Every day | ORAL | Status: DC
  Administered 2013-08-14 – 2013-08-15 (×2): 50 ug via ORAL
  Filled 2013-08-13 (×3): qty 1

## 2013-08-13 MED ORDER — ALBUTEROL SULFATE HFA 108 (90 BASE) MCG/ACT IN AERS
2.0000 | INHALATION_SPRAY | Freq: Four times a day (QID) | RESPIRATORY_TRACT | Status: DC | PRN
  Filled 2013-08-13: qty 6.7

## 2013-08-13 MED ORDER — CIPROFLOXACIN IN D5W 400 MG/200ML IV SOLN
400.0000 mg | Freq: Once | INTRAVENOUS | Status: AC
  Administered 2013-08-13: 400 mg via INTRAVENOUS
  Filled 2013-08-13: qty 200

## 2013-08-13 MED ORDER — TRAMADOL HCL 50 MG PO TABS: 50.0000 mg | ORAL_TABLET | Freq: Four times a day (QID) | ORAL | Status: DC | PRN

## 2013-08-13 MED ORDER — ISOSORB DINITRATE-HYDRALAZINE 20-37.5 MG PO TABS
1.0000 | ORAL_TABLET | Freq: Two times a day (BID) | ORAL | Status: DC
  Administered 2013-08-14 – 2013-08-15 (×3): 1 via ORAL
  Filled 2013-08-13 (×4): qty 1

## 2013-08-13 MED ORDER — ASPIRIN EC 81 MG PO TBEC
81.0000 mg | DELAYED_RELEASE_TABLET | Freq: Two times a day (BID) | ORAL | Status: DC
  Administered 2013-08-13 – 2013-08-15 (×4): 81 mg via ORAL
  Filled 2013-08-13 (×6): qty 1

## 2013-08-13 MED ORDER — ADULT MULTIVITAMIN W/MINERALS CH
1.0000 | ORAL_TABLET | Freq: Every day | ORAL | Status: DC
  Administered 2013-08-14: 1 via ORAL
  Filled 2013-08-13 (×2): qty 1

## 2013-08-13 MED ORDER — LEVOTHYROXINE SODIUM 175 MCG PO TABS
175.0000 ug | ORAL_TABLET | Freq: Every day | ORAL | Status: DC
  Administered 2013-08-14 – 2013-08-15 (×2): 175 ug via ORAL
  Filled 2013-08-13 (×3): qty 1

## 2013-08-13 MED ORDER — SODIUM CHLORIDE 0.9 % IV BOLUS (SEPSIS)
1000.0000 mL | Freq: Once | INTRAVENOUS | Status: AC
  Administered 2013-08-13: 1000 mL via INTRAVENOUS

## 2013-08-13 MED ORDER — HYDROCODONE-ACETAMINOPHEN 5-325 MG PO TABS
1.0000 | ORAL_TABLET | Freq: Four times a day (QID) | ORAL | Status: DC | PRN
  Administered 2013-08-14 – 2013-08-15 (×5): 1 via ORAL
  Filled 2013-08-13 (×5): qty 1

## 2013-08-13 MED ORDER — ZOLPIDEM TARTRATE 5 MG PO TABS: 5.0000 mg | ORAL_TABLET | Freq: Every evening | ORAL | Status: DC | PRN

## 2013-08-13 MED ORDER — VITAMIN B-1 100 MG PO TABS
100.0000 mg | ORAL_TABLET | Freq: Every day | ORAL | Status: DC
  Administered 2013-08-14 – 2013-08-15 (×2): 100 mg via ORAL
  Filled 2013-08-13 (×2): qty 1

## 2013-08-13 MED ORDER — SODIUM CHLORIDE 0.9 % IV BOLUS (SEPSIS)
1000.0000 mL | INTRAVENOUS | Status: DC | PRN
  Administered 2013-08-13: 1000 mL via INTRAVENOUS

## 2013-08-13 MED ORDER — POTASSIUM CHLORIDE IN NACL 20-0.9 MEQ/L-% IV SOLN
INTRAVENOUS | Status: DC
  Administered 2013-08-13: 21:00:00 via INTRAVENOUS
  Filled 2013-08-13 (×2): qty 1000

## 2013-08-13 MED ORDER — SODIUM CHLORIDE 0.9 % IV SOLN: INTRAVENOUS | Status: DC

## 2013-08-13 MED ORDER — CIPROFLOXACIN HCL 500 MG PO TABS
500.0000 mg | ORAL_TABLET | Freq: Two times a day (BID) | ORAL | Status: DC
  Administered 2013-08-14 – 2013-08-15 (×3): 500 mg via ORAL
  Filled 2013-08-13 (×5): qty 1

## 2013-08-13 MED ORDER — ACETAMINOPHEN 325 MG PO TABS: 325.0000 mg | ORAL_TABLET | Freq: Two times a day (BID) | ORAL | Status: DC | PRN

## 2013-08-13 MED ORDER — SENNOSIDES-DOCUSATE SODIUM 8.6-50 MG PO TABS: 1.0000 | ORAL_TABLET | Freq: Every evening | ORAL | Status: DC | PRN

## 2013-08-13 NOTE — ED Notes (Signed)
Bed: WA15 Expected date:  Expected time:  Means of arrival:  Comments: Res b 

## 2013-08-13 NOTE — ED Notes (Signed)
Patient has catheter with leg bag, when I went to obtain urine, there was very little in it. RN Chari Manning and NT [place] notified. Patient given Sprite to drink

## 2013-08-13 NOTE — ED Provider Notes (Signed)
CSN: [id number]     Arrival date & time 08/13/13  1041 History   First MD Initiated Contact with Patient 08/13/13 1106     Chief Complaint  Patient presents with  . Hypotension   (Consider location/radiation/quality/duration/timing/severity/associated sxs/prior Treatment) The history is provided by the patient.   Patient presents from urology clinic with hypotension, diaphoresis, and weakness. He had recent ACDF surgery. Since being discharged home he reports 4 episodes of weakness and malaise. Denies chest pain. Denies N/V. Diarrhea, constipation. Endorses decreased urine output and decreased PO intake. Further, he has continued to take his anti-hypertensives during this time.   Past Medical History  Diagnosis Date  . Grave's disease   . Allergy   . Asthma   . Hypertension   . Substance abuse   . Graves disease   . Acute combined systolic and diastolic heart failure 03/11/2013  . CHF (congestive heart failure)   . Shortness of breath     with asthma  . Arthritis   . Cardiomyopathy    Past Surgical History  Procedure Laterality Date  . Cardiac catheterization    . Anterior cervical decomp/discectomy fusion N/A 07/30/2013    Procedure: CERVICAL FIVE,CERVICAL SIX CORPECTOMY,ANTERIOR ARTHRODESIS,PEEK INTERBODY GRAFT CERVICAL FOUR-CERVICAL SEVEN,ANTERIOR INSTRUMENTATION;  Surgeon: Carmela Hurt, MD;  Location: [facility];  Service: Neurosurgery;  Laterality: N/A;   No family history on file. History  Substance Use Topics  . Smoking status: Former Games developer  . Smokeless tobacco: Not on file  . Alcohol Use: No    Review of Systems  Allergies  Keflex and Aspirin  Home Medications   Current Outpatient Rx  Name  Route  Sig  Dispense  Refill  . acetaminophen (TYLENOL) 500 MG tablet   Oral   Take 1,500 mg by mouth 2 (two) times daily as needed for pain.          Marland Kitchen albuterol (PROVENTIL HFA;VENTOLIN HFA) 108 (90 BASE) MCG/ACT inhaler   Inhalation   Inhale 2 puffs into the  lungs every 6 (six) hours as needed for wheezing.         Marland Kitchen aspirin EC 81 MG tablet   Oral   Take 81 mg by mouth 2 (two) times daily.         . carvedilol (COREG) 12.5 MG tablet   Oral   Take 12.5 mg by mouth 2 (two) times daily with a meal.         . digoxin (LANOXIN) 0.25 MG tablet   Oral   Take 1 tablet (0.25 mg total) by mouth daily.   30 tablet   3   . furosemide (LASIX) 20 MG tablet   Oral   Take 20 mg by mouth daily.         Marland Kitchen HYDROcodone-acetaminophen (NORCO/VICODIN) 5-325 MG per tablet   Oral   Take 1 tablet by mouth every 6 (six) hours as needed for moderate pain or severe pain.   60 tablet   0   . ibuprofen (ADVIL,MOTRIN) 600 MG tablet   Oral   Take 600 mg by mouth every 6 (six) hours as needed.         . isosorbide-hydrALAZINE (BIDIL) 20-37.5 MG per tablet   Oral   Take 1 tablet by mouth 3 (three) times daily.   90 tablet   2   . levothyroxine (SYNTHROID, LEVOTHROID) 175 MCG tablet   Oral   Take 175 mcg by mouth daily before breakfast. Takes 175mg  and 50mg  to equal  225mg  daily         . levothyroxine (SYNTHROID, LEVOTHROID) 50 MCG tablet   Oral   Take 50 mcg by mouth daily before breakfast.         . Multiple Vitamin (MULTIVITAMIN WITH MINERALS) TABS   Oral   Take 1 tablet by mouth daily.         Marland Kitchen olmesartan-hydrochlorothiazide (BENICAR HCT) 40-25 MG per tablet   Oral   Take 1 tablet by mouth daily.         Marland Kitchen thiamine (VITAMIN B-1) 100 MG tablet   Oral   Take 100 mg by mouth daily.         . traMADol (ULTRAM) 50 MG tablet   Oral   Take 50 mg by mouth every 6 (six) hours as needed for pain.          BP 113/74  Pulse 72  Temp(Src) 97.6 F (36.4 C) (Oral)  Resp 14  SpO2 99% Physical Exam  Constitutional: He is oriented to person, place, and time. He appears well-developed and well-nourished.  HENT:  Head: Normocephalic.  Mouth/Throat: No oropharyngeal exudate.  Tacky mucous membranes  Eyes: EOM are normal. Pupils  are equal, round, and reactive to light.  Neck:  Neck brace in place. Wound appears clean without signs of infection or drainage  Cardiovascular: Normal rate, regular rhythm, normal heart sounds and intact distal pulses.  Exam reveals no gallop and no friction rub.   No murmur heard. Pulmonary/Chest: Effort normal and breath sounds normal. No respiratory distress. He has no wheezes. He has no rales.  Abdominal: Soft. Bowel sounds are normal. He exhibits no distension. There is no tenderness. There is no rebound and no guarding.  Musculoskeletal: He exhibits no edema and no tenderness.  Neurological: He is oriented to person, place, and time. No cranial nerve deficit.  Generalized weakness.  Skin: He is diaphoretic.  Psychiatric: He has a normal mood and affect. His behavior is normal.    ED Course  Procedures (including critical care time) Labs Review Labs Reviewed  CBC - Abnormal; Notable for the following:    Hemoglobin 12.2 (*)    HCT 36.4 (*)    All other components within normal limits  COMPREHENSIVE METABOLIC PANEL - Abnormal; Notable for the following:    Sodium 133 (*)    Chloride 95 (*)    Glucose, Bld 145 (*)    BUN 40 (*)    Creatinine, Ser 1.68 (*)    GFR calc non Af Amer 42 (*)    GFR calc Af Amer 49 (*)    All other components within normal limits  URINALYSIS, ROUTINE W REFLEX MICROSCOPIC - Abnormal; Notable for the following:    Color, Urine RED (*)    APPearance TURBID (*)    Hgb urine dipstick LARGE (*)    Protein, ur >300 (*)    Nitrite POSITIVE (*)    Leukocytes, UA LARGE (*)    All other components within normal limits  FIBRINOGEN - Abnormal; Notable for the following:    Fibrinogen 628 (*)    All other components within normal limits  URINE MICROSCOPIC-ADD ON - Abnormal; Notable for the following:    Bacteria, UA MANY (*)    All other components within normal limits  CULTURE, BLOOD (ROUTINE X 2)  CULTURE, BLOOD (ROUTINE X 2)  URINE CULTURE   CULTURE, EXPECTORATED SPUTUM-ASSESSMENT  LACTIC ACID, PLASMA  TROPONIN I  PROTIME-INR  APTT  CORTISOL  TYPE  AND SCREEN  ABO/RH   Imaging Review Dg Chest Portable 1 View  08/13/2013   CLINICAL DATA:  Shortness of breath and sepsis.  EXAM: PORTABLE CHEST - 1 VIEW  COMPARISON:  03/10/2013.  FINDINGS: Mediastinum and hilar structures are normal. Lungs are clear. Underlying COPD cannot be excluded. No pleural effusion or pneumothorax. Heart size normal. Normal pulmonary vascularity. Prior cervical spine fusion.  IMPRESSION: 1. No active cardiopulmonary disease. 2. COPD cannot be excluded. No evidence of significant pulmonary infiltrate.   Electronically Signed   By: Maisie Fus  Register   On: 08/13/2013 11:50    EKG Interpretation   None       MDM   1. AKI (acute kidney injury)     1. Hypovolemia c/b AKI and hypotension The patient presents with AKI (Cr 1.63 baseline 1) with elevated BUN. May be pre-renal etiology but other causes and Cr trending are warranted. Furthermore, his hypotension (orignally 75/50) responded well to 1 L of normal saline). Plan to hold anti-htn medications and consult IM for admission.    Pleas Koch, MD 08/13/13 1410

## 2013-08-13 NOTE — H&P (Signed)
Peter [name] is an 62 y.o. male.   Chief Complaint: near syncope at the urologists office today HPI:  The patient is a 62 year old black man with several medical problems who had recent cervical spine surgery with discharge from the hospital on 08/10/2013. During that hospitalization he had difficulty voiding, so a Foley cath was placed. Today he had a urologist appointment and while waiting to be seen in check-in he developed diaphoresis, lightheadedness, and weakness in his legs. Vital signs were checked and his blood pressure systolic was around 70, so he was transferred to the emergency room for evaluation. Workup in the emergency room showed initial hypotension and a urinalysis with bacteriuria, so he was started on IV fluids and antibiotics for possible urinary tract infection. Recently he had had a few episodes of left-sided chest discomfort of moderate intensity lasting up to an hour a time with last episode yesterday. He has not had episodes of palpitations and has no history of cardiac arrhythmia. He does have a history of idiopathic dilated cardiomyopathy with left ventricular ejection fraction approximately 20% for which he is on multiple medications. He has not had a recent change in his medications and has not had recent fever, chills, or abdominal pain. He had not been checking his weight on a daily basis. Since his cervical spine surgery he has been eating and drinking, but he admits that he probably is not drinking as much fluids as usual. His medical history is also significant for a cardiac catheterization in June 2014 that did not show significant coronary artery disease.  Past Medical History  Diagnosis Date  . Grave's disease   . Allergy   . Asthma   . Hypertension   . Substance abuse   . Graves disease   . Acute combined systolic and diastolic heart failure 03/11/2013  . CHF (congestive heart failure)   . Shortness of breath     with asthma  . Arthritis   . Cardiomyopathy      Medications Prior to Admission  Medication Sig Dispense Refill  . acetaminophen (TYLENOL) 500 MG tablet Take 1,500 mg by mouth 2 (two) times daily as needed for pain.       Marland Kitchen albuterol (PROVENTIL HFA;VENTOLIN HFA) 108 (90 BASE) MCG/ACT inhaler Inhale 2 puffs into the lungs every 6 (six) hours as needed for wheezing.      Marland Kitchen aspirin EC 81 MG tablet Take 81 mg by mouth 2 (two) times daily.      . carvedilol (COREG) 12.5 MG tablet Take 12.5 mg by mouth 2 (two) times daily with a meal.      . digoxin (LANOXIN) 0.25 MG tablet Take 1 tablet (0.25 mg total) by mouth daily.  30 tablet  3  . furosemide (LASIX) 20 MG tablet Take 20 mg by mouth daily.      Marland Kitchen HYDROcodone-acetaminophen (NORCO/VICODIN) 5-325 MG per tablet Take 1 tablet by mouth every 6 (six) hours as needed for moderate pain or severe pain.  60 tablet  0  . ibuprofen (ADVIL,MOTRIN) 600 MG tablet Take 600 mg by mouth every 6 (six) hours as needed.      . isosorbide-hydrALAZINE (BIDIL) 20-37.5 MG per tablet Take 1 tablet by mouth 3 (three) times daily.  90 tablet  2  . levothyroxine (SYNTHROID, LEVOTHROID) 175 MCG tablet Take 175 mcg by mouth daily before breakfast. Takes 175mg  and 50mg  to equal 225mg  daily      . levothyroxine (SYNTHROID, LEVOTHROID) 50 MCG tablet Take 50 mcg by  mouth daily before breakfast.      . Multiple Vitamin (MULTIVITAMIN WITH MINERALS) TABS Take 1 tablet by mouth daily.      Marland Kitchen olmesartan-hydrochlorothiazide (BENICAR HCT) 40-25 MG per tablet Take 1 tablet by mouth daily.      Marland Kitchen thiamine (VITAMIN B-1) 100 MG tablet Take 100 mg by mouth daily.      . traMADol (ULTRAM) 50 MG tablet Take 50 mg by mouth every 6 (six) hours as needed for pain.        ADDITIONAL HOME MEDICATIONS: No additional home medications  PHYSICIANS INVOLVED IN CARE: Creola Corn (PCP), Coletta Memos (nsurg), Levert Feinstein (neurology), Yates Decamp (card), Barnet Pall (GI)  Past Surgical History  Procedure Laterality Date  . Cardiac catheterization    .  Anterior cervical decomp/discectomy fusion N/A 07/30/2013    Procedure: CERVICAL FIVE,CERVICAL SIX CORPECTOMY,ANTERIOR ARTHRODESIS,PEEK INTERBODY GRAFT CERVICAL FOUR-CERVICAL SEVEN,ANTERIOR INSTRUMENTATION;  Surgeon: Carmela Hurt, MD;  Location: [facility];  Service: Neurosurgery;  Laterality: N/A;    History reviewed. No pertinent family history.   Social History:  reports that he quit smoking about 18 years ago. He has never used smokeless tobacco. He reports that he does not drink alcohol or use illicit drugs.  Allergies:  Allergies  Allergen Reactions  . Keflex [Cephalexin] Anaphylaxis  . Aspirin     Exacerbates asthma attacks.      ROS: Heart disease, thyroid disease and idiopathic dilated cardiomyopathy, hypothyroidism after Graves disease, asthma, bilateral leg edema, anxiety, vitamin B 12 deficiency  PHYSICAL EXAM: Blood pressure 118/78, pulse 75, temperature 98 F (36.7 C), temperature source Oral, resp. rate 15, height 6\' 2"  (1.88 m), weight 106.595 kg (235 lb), SpO2 100.00%. In general, the patient is a well-nourished well-developed black man who was in no apparent distress was sitting upright in bed. HEENT exam was within normal limits, neck was in a productive collar, chest was clear to auscultation, heart had a regular rate and rhythm and was without significant murmur or gallop, abdomen had normal bowel sounds and no hepatosplenomegaly or tenderness, his lower extremities had bilateral 1+ leg edema and the pedal pulses were palpable, he was alert and well oriented with normal affect, and he had intact light touch sensation throughout all extremities. He had 4-5 strength in the left lower extremity, and bilateral 4/5 handgrip strength (right less than left) and 4/5 right upper extremity strength. Cerebellar function and gait were not assessed  Results for orders placed during the hospital encounter of 08/13/13 (from the past 48 hour(s))  CBC     Status: Abnormal    Collection Time    08/13/13 11:10 AM      Result Value Range   WBC 9.8  4.0 - 10.5 K/uL   RBC 4.23  4.22 - 5.81 MIL/uL   Hemoglobin 12.2 (*) 13.0 - 17.0 g/dL   HCT 16.1 (*) 09.6 - 04.5 %   MCV 86.1  78.0 - 100.0 fL   MCH 28.8  26.0 - 34.0 pg   MCHC 33.5  30.0 - 36.0 g/dL   RDW 40.9  81.1 - 91.4 %   Platelets 328  150 - 400 K/uL  COMPREHENSIVE METABOLIC PANEL     Status: Abnormal   Collection Time    08/13/13 11:10 AM      Result Value Range   Sodium 133 (*) 135 - 145 mEq/L   Potassium 3.8  3.5 - 5.1 mEq/L   Chloride 95 (*) 96 - 112 mEq/L  CO2 25  19 - 32 mEq/L   Glucose, Bld 145 (*) 70 - 99 mg/dL   BUN 40 (*) 6 - 23 mg/dL   Creatinine, Ser 4.09 (*) 0.50 - 1.35 mg/dL   Calcium 81.1  8.4 - 91.4 mg/dL   Total Protein 7.7  6.0 - 8.3 g/dL   Albumin 4.1  3.5 - 5.2 g/dL   AST 18  0 - 37 U/L   ALT 15  0 - 53 U/L   Alkaline Phosphatase 99  39 - 117 U/L   Total Bilirubin 0.3  0.3 - 1.2 mg/dL   GFR calc non Af Amer 42 (*) >90 mL/min   GFR calc Af Amer 49 (*) >90 mL/min   Comment: (NOTE)     The eGFR has been calculated using the CKD EPI equation.     This calculation has not been validated in all clinical situations.     eGFR's persistently <90 mL/min signify possible Chronic Kidney     Disease.  LACTIC ACID, PLASMA     Status: None   Collection Time    08/13/13 11:10 AM      Result Value Range   Lactic Acid, Venous 1.5  0.5 - 2.2 mmol/L  CORTISOL     Status: None   Collection Time    08/13/13 11:10 AM      Result Value Range   Cortisol, Plasma 43.1     Comment: (NOTE)     AM:  4.3 - 22.4 ug/dL     PM:  3.1 - 78.2 ug/dL     Performed at [facility]  TROPONIN I     Status: None   Collection Time    08/13/13 11:10 AM      Result Value Range   Troponin I <0.30  <0.30 ng/mL   Comment:            Due to the release kinetics of cTnI,     a negative result within the first hours     of the onset of symptoms does not rule out     myocardial infarction with  certainty.     If myocardial infarction is still suspected,     repeat the test at appropriate intervals.  PROTIME-INR     Status: None   Collection Time    08/13/13 11:10 AM      Result Value Range   Prothrombin Time 12.9  11.6 - 15.2 seconds   INR 0.99  0.00 - 1.49  APTT     Status: None   Collection Time    08/13/13 11:10 AM      Result Value Range   aPTT 24  24 - 37 seconds  FIBRINOGEN     Status: Abnormal   Collection Time    08/13/13 11:10 AM      Result Value Range   Fibrinogen 628 (*) 204 - 475 mg/dL  TYPE AND SCREEN     Status: None   Collection Time    08/13/13 11:10 AM      Result Value Range   ABO/RH(D) O POS     Antibody Screen NEG     Sample Expiration 08/16/2013    ABO/RH     Status: None   Collection Time    08/13/13 11:10 AM      Result Value Range   ABO/RH(D) O POS    URINALYSIS, ROUTINE W REFLEX MICROSCOPIC     Status: Abnormal   Collection Time  08/13/13  1:27 PM      Result Value Range   Color, Urine RED (*) YELLOW   Comment: BIOCHEMICALS MAY BE AFFECTED BY COLOR   APPearance TURBID (*) CLEAR   Specific Gravity, Urine 1.028  1.005 - 1.030   pH 6.0  5.0 - 8.0   Glucose, UA NEGATIVE  NEGATIVE mg/dL   Hgb urine dipstick LARGE (*) NEGATIVE   Bilirubin Urine NEGATIVE  NEGATIVE   Ketones, ur NEGATIVE  NEGATIVE mg/dL   Protein, ur >098 (*) NEGATIVE mg/dL   Urobilinogen, UA 1.0  0.0 - 1.0 mg/dL   Nitrite POSITIVE (*) NEGATIVE   Leukocytes, UA LARGE (*) NEGATIVE  URINE MICROSCOPIC-ADD ON     Status: Abnormal   Collection Time    08/13/13  1:27 PM      Result Value Range   Squamous Epithelial / LPF RARE  RARE   WBC, UA TOO NUMEROUS TO COUNT  <3 WBC/hpf   RBC / HPF TOO NUMEROUS TO COUNT  <3 RBC/hpf   Bacteria, UA MANY (*) RARE   Urine-Other LESS THAN 10 mL OF URINE SUBMITTED     Dg Chest Portable 1 View  08/13/2013   CLINICAL DATA:  Shortness of breath and sepsis.  EXAM: PORTABLE CHEST - 1 VIEW  COMPARISON:  03/10/2013.  FINDINGS: Mediastinum  and hilar structures are normal. Lungs are clear. Underlying COPD cannot be excluded. No pleural effusion or pneumothorax. Heart size normal. Normal pulmonary vascularity. Prior cervical spine fusion.  IMPRESSION: 1. No active cardiopulmonary disease. 2. COPD cannot be excluded. No evidence of significant pulmonary infiltrate.   Electronically Signed   By: Maisie Fus  Register   On: 08/13/2013 11:50     Assessment/Plan #1 Near Syncope: It is most likely that he was somewhat intravascularly volume depleted after his cervical spine surgery, combined with multiple medications that could contribute to dizziness. His symptoms sounded vasovagal. We will have him in overnight for gentle fluid rehydration and telemetry to rule out the possibility of significant arrhythmia. We will also check serial cardiac isoenzymes given his recent history of atypical chest discomfort. He did have a cardiac catheterization study done in June 2014 that did not show significant coronary artery disease. #2 Acute Renal Insufficiency: He has had an interval increase in his serum creatinine level from baseline of 0.95 to the current level of 1.68 which is most likely from intravascular volume decrease in the face of an angiotensin receptor blocker. We will gently hydrate him and hold his angiotensin receptor blocker for now. In the morning we'll recheck a BUN and creatinine level. #3 Bacteriuria:  He has moderate bacteriuria without fever or leukocytosis. This is likely not from a urinary tract infection, however given his comorbid conditions we have started him on an antibiotic pending results of his urine culture.  Liberty Seto G 08/13/2013, 7:53 PM

## 2013-08-13 NOTE — ED Notes (Signed)
Pt coming from [facility], here for hypotension, diaphoretic and weakness Recent cervical spine surgery (this month)

## 2013-08-13 NOTE — ED Provider Notes (Signed)
Patient presents from [facility] with hypotension and diaphoresis. The patient has been noting episodes of feeling generally weak since his surgery. He has not had any trouble with chest pain or fevers. He has not noticed any blood in his stool. He has not had any vomiting or diarrhea. His at the office today they took his blood pressure and he was noted to be hypotensive. Patient did take all of his blood pressure medications this morning. Physical Exam  BP 113/74  Pulse 72  Temp(Src) 97.6 F (36.4 C) (Oral)  Resp 14  SpO2 99%  Physical Exam  Nursing note and vitals reviewed. Constitutional: He appears well-developed and well-nourished. No distress.  Patient is laughing and joking  HENT:  Head: Normocephalic and atraumatic.  Right Ear: External ear normal.  Left Ear: External ear normal.  Eyes: Conjunctivae are normal. Right eye exhibits no discharge. Left eye exhibits no discharge. No scleral icterus.  Neck: Neck supple. No tracheal deviation present.  Cardiovascular: Normal rate, regular rhythm and intact distal pulses.   Weak but equal pulses in all extremities  Pulmonary/Chest: Effort normal and breath sounds normal. No stridor. No respiratory distress. He has no wheezes.  Abdominal: He exhibits no distension. There is no tenderness. There is no rebound and no guarding.  Musculoskeletal: He exhibits no edema.  Neurological: He is alert. Cranial nerve deficit: no gross deficits.  Skin: Skin is warm and dry. No rash noted. There is pallor.  Psychiatric: He has a normal mood and affect.   EKG Normal sinus rhythm Normal axis, normal intervals Rate 72 Nonspecific T wave abnormalities in anterior laterally No significant change when compared to prior EKG  ED Course  Procedures  MDM  BP has improved with hydration.  Likely pre renal azotemia with additional component of taking his anti hypertensive agents this morning contributing to his low blood pressure.   Will continue  to monitor.  Admit for hydration, further evaluation, monitoring.     Celene Kras, MD 08/13/13 [phone]

## 2013-08-14 ENCOUNTER — Observation Stay (HOSPITAL_COMMUNITY): Payer: 59

## 2013-08-14 LAB — CBC
HCT: 33 % — ABNORMAL LOW (ref 39.0–52.0)
Hemoglobin: 11 g/dL — ABNORMAL LOW (ref 13.0–17.0)
MCH: 28.4 pg (ref 26.0–34.0)
Platelets: 299 10*3/uL (ref 150–400)
RBC: 3.87 MIL/uL — ABNORMAL LOW (ref 4.22–5.81)
RDW: 13.2 % (ref 11.5–15.5)
WBC: 8.5 10*3/uL (ref 4.0–10.5)

## 2013-08-14 LAB — BASIC METABOLIC PANEL
Chloride: 102 mEq/L (ref 96–112)
Creatinine, Ser: 1.12 mg/dL (ref 0.50–1.35)
GFR calc Af Amer: 80 mL/min — ABNORMAL LOW (ref >90)
Potassium: 3.9 mEq/L (ref 3.5–5.1)
Sodium: 136 mEq/L (ref 135–145)

## 2013-08-14 LAB — TROPONIN I: Troponin I: <0.3 ng/mL (ref <0.30)

## 2013-08-14 MED ORDER — CIPROFLOXACIN HCL 500 MG PO TABS: 500.0000 mg | ORAL_TABLET | Freq: Two times a day (BID) | ORAL | Status: DC

## 2013-08-14 MED ORDER — DIGOXIN 250 MCG PO TABS: 0.1250 mg | ORAL_TABLET | Freq: Every day | ORAL | Status: DC

## 2013-08-14 MED ORDER — FUROSEMIDE 20 MG PO TABS: 20.0000 mg | ORAL_TABLET | Freq: Every day | ORAL | Status: DC

## 2013-08-14 MED ORDER — ISOSORB DINITRATE-HYDRALAZINE 20-37.5 MG PO TABS: 1.0000 | ORAL_TABLET | Freq: Two times a day (BID) | ORAL | Status: DC

## 2013-08-14 MED ORDER — TAMSULOSIN HCL 0.4 MG PO CAPS
0.4000 mg | ORAL_CAPSULE | Freq: Every day | ORAL | Status: DC
  Administered 2013-08-14: 0.4 mg via ORAL
  Filled 2013-08-14 (×2): qty 1

## 2013-08-14 MED ORDER — SENNOSIDES-DOCUSATE SODIUM 8.6-50 MG PO TABS: 1.0000 | ORAL_TABLET | Freq: Every evening | ORAL | Status: DC | PRN

## 2013-08-14 NOTE — Progress Notes (Signed)
UR completed 

## 2013-08-14 NOTE — Progress Notes (Signed)
08/14/13 1611  OT Visit Information  Last OT Received On 08/14/13  Assistance Needed +1  History of Present Illness 62 yo male admitted with dizziness. S/p C5-C6 corpectomy, C4-C7 ACDF 11/5. Recent d/c from [facility].   OT Time Calculation  OT Start Time 1530  OT Stop Time 1546  OT Time Calculation (min) 16 min  Precautions  Precautions Cervical;Fall  Cervical Brace Hard collar;At all times  Restrictions  Weight Bearing Restrictions No  Cognition  Arousal/Alertness Awake/alert  Behavior During Therapy WFL for tasks assessed/performed  Overall Cognitive Status Within Functional Limits for tasks assessed  Exercises  Exercises Other exercises  Other Exercises  Other Exercises worked through [employer] and written program provided.    R shoulder AAROM to 90 and R abduction in gravity eliminated; bil elbows AROM supine vs sitting with arms supported on table; bil hand flexion, extension with focus on R extension actively and using putty and also ball with finger flexion in isolation from wrist flexion; L flexion index finger working at each joint then composite.   Pt had theraputty program from last admission; reviewed exercises he can do now and will look at them with him on next visit to see if he can do others without compensation or using fingers without weakness.    OT - End of Session  Activity Tolerance Patient tolerated treatment well  Patient left in bed;with call bell/phone within reach;with family/visitor present  OT Assessment/Plan  OT Frequency Min 2X/week  Follow Up Recommendations Outpatient OT  OT Goal Progression  Progress towards OT goals Progressing toward goals  ADL Goals  Pt Will Perform Eating with set-up;sitting (elbows supported on table, with min c/o fatique)  OT General Charges  $OT Visit 1 Procedure  OT Treatments  $Therapeutic Exercise 8-22 mins  Marica Otter, OTR/L [phone] 08/14/2013

## 2013-08-14 NOTE — Progress Notes (Signed)
Pt just walked in hall yet only able to void 50 ml post ambulation. Recent bladder scan resulted 610 ml urine. Pt denies discomfort and/or urgency to void. Dr. Timothy Lasso aware via phone. See new orders entered into EPIC by MD.

## 2013-08-14 NOTE — Progress Notes (Signed)
Subjective: Admitted yesterday c hypotension, Pre-Syncope, ?UTI, AKI S/P Fluids. Feels better, less dizzy Mild congestion - had been using less inhalers.  Objective: Vital signs in last 24 hours: Temp:  [97.6 F (36.4 C)-98.7 F (37.1 C)] 98.7 F (37.1 C) (11/20 0550) Pulse Rate:  [67-102] 102 (11/20 0552) Resp:  [8-18] 18 (11/20 0550) BP: (76-159)/(51-91) 159/91 mmHg (11/20 0552) SpO2:  [93 %-100 %] 98 % (11/20 0550) Weight:  [106.595 kg (235 lb)] 106.595 kg (235 lb) (11/19 1609) Weight change:  Last BM Date: 08/12/13  CBG (last 3)  No results found for this basename: GLUCAP,  in the last 72 hours  Intake/Output from previous day:  Intake/Output Summary (Last 24 hours) at 08/14/13 0719 Last data filed at 08/14/13 0600  Gross per 24 hour  Intake 896.25 ml  Output   1350 ml  Net -453.75 ml   11/19 0701 - 11/20 0700 In: 896.3 [P.O.:240; I.V.:656.3] Out: 1350 [Urine:1350]   Physical Exam  General appearance: A and O Eyes: no scleral icterus Throat: oropharynx moist without erythema Resp: Upper airway congestion, cough, min rales. Cardio: Reg 1/6 SEM GI: soft, non-tender; bowel sounds normal; no masses,  no organomegaly Extremities: no clubbing, cyanosis or edema RUE and hand weakness seen but he reports increased strength over last week.   Lab Results:  Recent Labs  08/13/13 1110 08/14/13 0220  NA 133* 136  K 3.8 3.9  CL 95* 102  CO2 25 24  GLUCOSE 145* 82  BUN 40* 32*  CREATININE 1.68* 1.12  CALCIUM 10.3 9.6     Recent Labs  08/13/13 1110  AST 18  ALT 15  ALKPHOS 99  BILITOT 0.3  PROT 7.7  ALBUMIN 4.1     Recent Labs  08/13/13 1110 08/14/13 0220  WBC 9.8 8.5  HGB 12.2* 11.0*  HCT 36.4* 33.0*  MCV 86.1 85.3  PLT 328 299    Lab Results  Component Value Date   INR 0.99 08/13/2013   INR 1.00 03/11/2013     Recent Labs  08/13/13 1110 08/13/13 2058 08/14/13 0220  TROPONINI <0.30 <0.30 <0.30    No results found for this  basename: TSH, T4TOTAL, FREET3, T3FREE, THYROIDAB,  in the last 72 hours  No results found for this basename: VITAMINB12, FOLATE, FERRITIN, TIBC, IRON, RETICCTPCT,  in the last 72 hours  Micro Results: Recent Results (from the past 240 hour(s))  CULTURE, EXPECTORATED SPUTUM-ASSESSMENT     Status: None   Collection Time    08/13/13 10:51 PM      Result Value Range Status   Specimen Description SPUTUM   Final   Special Requests Normal   Final   Sputum evaluation     Final   Value: THIS SPECIMEN IS ACCEPTABLE. RESPIRATORY CULTURE REPORT TO FOLLOW.   Report Status 08/13/2013 FINAL   Final     Studies/Results: Dg Chest Portable 1 View  08/13/2013   CLINICAL DATA:  Shortness of breath and sepsis.  EXAM: PORTABLE CHEST - 1 VIEW  COMPARISON:  03/10/2013.  FINDINGS: Mediastinum and hilar structures are normal. Lungs are clear. Underlying COPD cannot be excluded. No pleural effusion or pneumothorax. Heart size normal. Normal pulmonary vascularity. Prior cervical spine fusion.  IMPRESSION: 1. No active cardiopulmonary disease. 2. COPD cannot be excluded. No evidence of significant pulmonary infiltrate.   Electronically Signed   By: Maisie Fus  Register   On: 08/13/2013 11:50     Medications: Scheduled: . sodium chloride   Intravenous STAT  . aspirin  EC  81 mg Oral BID  . ciprofloxacin  500 mg Oral BID  . isosorbide-hydrALAZINE  1 tablet Oral BID  . levothyroxine  175 mcg Oral QAC breakfast  . levothyroxine  50 mcg Oral QAC breakfast  . multivitamin with minerals  1 tablet Oral Daily  . thiamine  100 mg Oral Daily   Continuous: . 0.9 % NaCl with KCl 20 mEq / L 75 mL/hr at 08/13/13 2115     Assessment/Plan: Principal Problem:   Dizziness and giddiness Active Problems:   Acute combined systolic and diastolic heart failure   HTN (hypertension)   Acute renal insufficiency  Hypotension/presyncope - Better.  May go home later. AKI- Better.  Cr coming down to baseline. U retention - Foley  in place.  Ok to d/c and follow post void residual. ?UTI - finish 3 days Abx. S/P neck/Cervical spine surgery and weakness - coming along. Cardiomyopathy c EF 20% CP - (-) CK/Trop I and EKGs.  Recent (-) Cath Anemia - Mild but worse post hydration.    Anti-infectives   Start     Dose/Rate Route Frequency Ordered Stop   08/14/13 0800  ciprofloxacin (CIPRO) tablet 500 mg     500 mg Oral 2 times daily 08/13/13 2015     08/13/13 1600  ciprofloxacin (CIPRO) IVPB 400 mg     400 mg 200 mL/hr over 60 Minutes Intravenous  Once 08/13/13 1552 08/13/13 1814     DVT Prophylaxis    LOS: 1 day   Kessa Fairbairn M 08/14/2013, 7:19 AM

## 2013-08-14 NOTE — Progress Notes (Signed)
08/14/13 8pm Nursing Patient voided 100cc on his own at 730 pm. Bladder scanned patient at this time for 658 cc urine. Dr Bryn Gulling called reg. This information. Order received to insert foley catheter.

## 2013-08-14 NOTE — Care Management Note (Signed)
    Page 1 of 1   08/14/2013     4:17:09 PM   CARE MANAGEMENT NOTE 08/14/2013  Patient:  Peter [name], Peter [name]   Account Number:  [id number]  Date Initiated:  08/14/2013  Documentation initiated by:  Lorenda Ishihara  Subjective/Objective Assessment:   61 yo male admitted with dizziness. PTA lived at home.     Action/Plan:   Home when stable   Anticipated DC Date:  08/14/2013   Anticipated DC Plan:  HOME/SELF CARE      DC Planning Services  CM consult      Choice offered to / List presented to:             Status of service:  Completed, signed off Medicare Important Message given?   (If response is "NO", the following Medicare IM given date fields will be blank) Date Medicare IM given:   Date Additional Medicare IM given:    Discharge Disposition:  HOME/SELF CARE  Per UR Regulation:  Reviewed for med. necessity/level of care/duration of stay  If discussed at Long Length of Stay Meetings, dates discussed:    Comments:

## 2013-08-14 NOTE — Discharge Summary (Signed)
Physician Discharge Summary  DISCHARGE SUMMARY   Patient ID: Adem Costlow MR#: [id number] DOB/AGE: 61/17/1952 61 y.o.   Attending Physician:Alika Eppes M  Patient's BMW:UXLKG,MWNU M, MD  Consults:Treatment Team:  Antony Haste, MD None  Admit date: 08/13/2013 Discharge date: 08/15/2013  Discharge Diagnoses:  Principal Problem:   Dizziness and giddiness Active Problems:   Acute combined systolic and diastolic heart failure   HTN (hypertension)   Acute renal insufficiency   Patient Active Problem List   Diagnosis Date Noted  . Dizziness and giddiness 08/13/2013    Class: Acute  . Acute renal insufficiency 08/13/2013    Class: Acute  . Spondylosis, cervical, with myelopathy 08/01/2013  . Weakness 06/03/2013  . Acute combined systolic and diastolic heart failure 03/11/2013  . Volume overload 03/11/2013  . HTN (hypertension) 03/11/2013  . Abnormal CK 03/11/2013  . DOE (dyspnea on exertion) 03/11/2013  . Unspecified hypothyroidism 03/11/2013   Past Medical History  Diagnosis Date  . Grave's disease   . Allergy   . Asthma   . Hypertension   . Substance abuse   . Graves disease   . Acute combined systolic and diastolic heart failure 03/11/2013  . CHF (congestive heart failure)   . Shortness of breath     with asthma  . Arthritis   . Cardiomyopathy     Discharged Condition: Stable   Discharge Medications:   Medication List    STOP taking these medications       ibuprofen 600 MG tablet  Commonly known as:  ADVIL,MOTRIN     olmesartan-hydrochlorothiazide 40-25 MG per tablet  Commonly known as:  BENICAR HCT      TAKE these medications       acetaminophen 500 MG tablet  Commonly known as:  TYLENOL  Take 1,500 mg by mouth 2 (two) times daily as needed for pain.     albuterol 108 (90 BASE) MCG/ACT inhaler  Commonly known as:  PROVENTIL HFA;VENTOLIN HFA  Inhale 2 puffs into the lungs every 6 (six) hours as needed for wheezing.      aspirin EC 81 MG tablet  Take 81 mg by mouth 2 (two) times daily.     carvedilol 6.25 MG tablet  Commonly known as:  COREG  Take 2 tablets (12.5 mg total) by mouth 2 (two) times daily with a meal.     ciprofloxacin 500 MG tablet  Commonly known as:  CIPRO  Take 1 tablet (500 mg total) by mouth 2 (two) times daily.     digoxin 0.25 MG tablet  Commonly known as:  LANOXIN  Take 0.5 tablets (0.125 mg total) by mouth daily.  Start taking on:  08/18/2013     furosemide 20 MG tablet  Commonly known as:  LASIX  Take 1 tablet (20 mg total) by mouth daily.  Start taking on:  08/25/2013     HYDROcodone-acetaminophen 5-325 MG per tablet  Commonly known as:  NORCO/VICODIN  Take 1 tablet by mouth every 6 (six) hours as needed for moderate pain or severe pain.     isosorbide-hydrALAZINE 20-37.5 MG per tablet  Commonly known as:  BIDIL  Take 1 tablet by mouth 2 (two) times daily.     levothyroxine 175 MCG tablet  Commonly known as:  SYNTHROID, LEVOTHROID  Take 175 mcg by mouth daily before breakfast. Takes 175mg  and 50mg  to equal 225mg  daily     levothyroxine 50 MCG tablet  Commonly known as:  SYNTHROID, LEVOTHROID  Take 50 mcg by mouth daily  before breakfast.     multivitamin with minerals Tabs tablet  Take 1 tablet by mouth daily.     senna-docusate 8.6-50 MG per tablet  Commonly known as:  Senokot-S  Take 1 tablet by mouth at bedtime as needed for mild constipation.     tamsulosin 0.4 MG Caps capsule  Commonly known as:  FLOMAX  Take 1 capsule (0.4 mg total) by mouth daily after supper.     thiamine 100 MG tablet  Commonly known as:  VITAMIN B-1  Take 100 mg by mouth daily.     traMADol 50 MG tablet  Commonly known as:  ULTRAM  Take 50 mg by mouth every 6 (six) hours as needed for pain.        Hospital Procedures: Dg Cervical Spine 2-3 Views  07/30/2013   CLINICAL DATA:  C4-C7 ACDF.  EXAM: CERVICAL SPINE - 2-3 VIEW  COMPARISON:  None.  FINDINGS: A surgical device tip  projects at the level of the C4-5 disc space. Partial visualization of the anterior fusion plate and screw fixator is appreciated at C4 and C5. An NG tube and nasogastric tube are appreciated.  IMPRESSION: C4 through C7 ACDF   Electronically Signed   By: Salome Holmes M.D.   On: 07/30/2013 13:50   Dg Chest Portable 1 View  08/13/2013   CLINICAL DATA:  Shortness of breath and sepsis.  EXAM: PORTABLE CHEST - 1 VIEW  COMPARISON:  03/10/2013.  FINDINGS: Mediastinum and hilar structures are normal. Lungs are clear. Underlying COPD cannot be excluded. No pleural effusion or pneumothorax. Heart size normal. Normal pulmonary vascularity. Prior cervical spine fusion.  IMPRESSION: 1. No active cardiopulmonary disease. 2. COPD cannot be excluded. No evidence of significant pulmonary infiltrate.   Electronically Signed   By: Maisie Fus  Register   On: 08/13/2013 11:50    History of Present Illness: 62 year old black man with several medical problems who had recent cervical spine surgery with discharge from the hospital on 08/10/2013. During that hospitalization he had difficulty voiding, so a Foley cath was placed. 11/19 he had a urologist appointment and while waiting to be seen in check-in he developed diaphoresis, lightheadedness, and weakness in his legs. Vital signs were checked and his blood pressure systolic was around 70, so he was transferred to the emergency room for evaluation. Workup in the emergency room showed initial hypotension and a urinalysis with bacteriuria, so he was started on IV fluids and antibiotics for possible urinary tract infection. Recently he had had a few episodes of left-sided chest discomfort of moderate intensity lasting up to an hour a time with last episode yesterday. He has not had episodes of palpitations and has no history of cardiac arrhythmia. He does have a history of idiopathic dilated cardiomyopathy with left ventricular ejection fraction approximately 20% for which he is on  multiple medications. He has not had a recent change in his medications and has not had recent fever, chills, or abdominal pain. He had not been checking his weight on a daily basis. Since his cervical spine surgery he has been eating and drinking, but he admits that he probably is not drinking as much fluids as usual. His medical history is also significant for a cardiac catheterization in June 2014 that did not show significant coronary artery disease.   Hospital Course: Admitted 11/19 c hypotension, Pre-Syncope, ?UTI, AKI.  He ruled out for MI.  NO Arrhythmia. S/P Fluids. Dig, Losartan vrs Benicar, lasix and Coreg 12.5 BID held Feels better, less  dizzy. CXR was (-) and done b/c mild congestion yesterday - has been using less inhalers.  Breathing better today.  He took till noon 11/20 to urinate and had 600 cc post void residual.  I and O Cath done.  Flomax started.  Dr Julien Girt consulted.  Had seen Dr Mena Goes prior to hospital stay.  We continued to try and keep catheter out.  He failed and had to have it re-inserted.  He will be d/ced c the catheter in place and will follow up c Dr Julien Girt to get it out.  He was sent home c a Rx for Flomax.  The Cx came back E coli UTI and he will finish Cipro Abx. Dr Mena Goes did see him 11/21.  Hypotension/presyncope - Better and now high again. Will be d/ced on much less meds and restart slowly over the next few days. AKI- Better. Cr down to baseline.  U retention - Foley  D/ced and re-started. E coli UTI - finish 3 days Abx.  S/P neck/Cervical spine surgery and weakness - coming along. Resume PT/OT as outpatient.  He had Rx here in house as well. Cardiomyopathy c EF 20%.  Not retaining fluid.  Restart home meds slowly CP - (-) CK/Trop I and EKGs. Recent (-) Cath  Anemia - Mild but worse post hydration. Will follow as outpt.  Will get him ready for d/c and then get him back to office quickly for follow up c labs and following BP.  His correct med list prior  to admission was:  Complete Medication List: 1)  Synthroid 25 Mcg Tabs (Levothyroxine sodium) .Marland Kitchen.. 1 po qd ---takes in addition to qd for total dose of qd 2)  Tramadol Hcl 50 Mg Tabs (Tramadol hcl) .Marland Kitchen.. 1 po bid prn 3)  Losartan Potassium 50 Mg Tabs (Losartan potassium) .Marland Kitchen.. 1 po qd 4)  Bidil 20-37.5 Mg Tabs (Isosorb dinitrate-hydralazine) .Marland Kitchen.. 1 tab po tid 5)  Digox 250 Mcg Tabs (Digoxin) .Marland Kitchen.. 1 tab po qd 6)  Carvedilol  Mg Tabs (Carvedilol) .... One po bid 7)  Ventolin Hfa 108 (90 Base) Mcg/act Aers (Albuterol sulfate) .... Inhale two puffs by mouth three times daily as needed 8)  Tylenol 325 Mg Tabs (Acetaminophen) .... Prn "taking to many" per pt 9)  Furosemide 20 Mg Tabs (Furosemide) .Marland Kitchen.. 1 po daily prn 10)  B Complex-folic Acid Tabs (Folic acid-vit b6-vit b12 tabs) .... 200mg  1 qd 11)  B-12 1000 Mcg Caps (Cyanocobalamin) .... Tid 12)  Synthroid 200 Mcg Tabs (Levothyroxine sodium) .Marland Kitchen.. 1 po qd ---in addition to qd for total of qd 13)  Multivitamins Tabs (Multiple vitamin) .... Take one tablet by mouth every day 14)  Aspirin 81 Mg Ec Tab (Aspirin) .... Take one (1) tablet by mouth daily 15)  Omega 3 Cpdr (Omega-3 fatty acids cpdr) .... Take one tablet by mouth twice daily 16)  Eql Vitamin C 1000 Mg Tabs (Ascorbic acid) .... Take one tablet by mouth once daily    Day of Discharge Exam BP 129/88  Pulse 98  Temp(Src) 98.2 F (36.8 C) (Oral)  Resp 18  Ht 6\' 2"  (1.88 m)  Wt 106.595 kg (235 lb)  BMI 30.16 kg/m2  SpO2 96%  Physical Exam:  General appearance: A and O  Eyes: no scleral icterus  Throat: oropharynx moist without erythema Neck Brace is on currently.  Resp: Upper airway congestion, cough, min rales.  Cardio: Reg 1/6 SEM  GI: soft, non-tender; bowel sounds normal; no  masses, no organomegaly  Extremities: no clubbing, cyanosis or edema  RUE and hand weakness seen but he reports increased strength over last week.    Discharge  Labs:  Recent Labs  08/14/13 0220 08/15/13 0450  NA 136 136  K 3.9 3.6  CL 102 100  CO2 24 26  GLUCOSE 82 98  BUN 32* 25*  CREATININE 1.12 0.98  CALCIUM 9.6 9.9    Recent Labs  08/13/13 1110  AST 18  ALT 15  ALKPHOS 99  BILITOT 0.3  PROT 7.7  ALBUMIN 4.1    Recent Labs  08/14/13 0220 08/15/13 0450  WBC 8.5 6.3  HGB 11.0* 10.9*  HCT 33.0* 31.5*  MCV 85.3 84.9  PLT 299 285    Recent Labs  08/13/13 1110 08/13/13 2058 08/14/13 0220  TROPONINI <0.30 <0.30 <0.30   No results found for this basename: TSH, T4TOTAL, FREET3, T3FREE, THYROIDAB,  in the last 72 hours No results found for this basename: VITAMINB12, FOLATE, FERRITIN, TIBC, IRON, RETICCTPCT,  in the last 72 hours Lab Results  Component Value Date   INR 0.99 08/13/2013   INR 1.00 03/11/2013       Discharge instructions:  01-Home or Self Care Follow-up Information   Follow up with [name], MD In 4 days.   Specialty:  Internal Medicine   Contact information:   [address] [facility] [place] [address] [phone]       Follow up with Antony Haste, MD In 7 days.   Specialty:  Urology   Contact information:   [address] [place] [address] [phone]        Disposition: Home  Follow-up Appts: Follow-up with Dr. Timothy Lasso at [facility] in 3-4 days.  Call for appointment.  Condition on Discharge: stable  Tests Needing Follow-up: Labs/BP  Time spent in discharge (includes decision making & examination of pt): 30 min  Signed: Hades Mathew M 08/15/2013, 7:24 AM

## 2013-08-14 NOTE — Consult Note (Signed)
Urology Consult  Referring physician: Dr Creola Corn Reason for referral: Urinary retention  Chief Complaint: Inability to urinate  History of Present Illness: Peter [name] is a 62 years old male, patient of Dr Estil Daft  Seen in consultation yesterday for urination after C-spine surgery earlier this month.  He was discharged home with an indwelling Foley catheter. He had decreased stream, hesitancy, intermittency prior to surgery.  The patient appeared septic yesterday and was sent to the ER from the office.  He was admitted through the ER.  The Foley catheter was removed early this morning.  He voided only 50 cc of urine after catheter removal.  He was in and out cath'd for 500 ml of urine about 1 1/2 hours ago.  He has not voided since and does not have the urge to urinate at this time.    Urine culture is positive for E. Coli.  He is currently on Cipro.  The patient is alert and oriented and is stable.  His creatinine is down to 1.12 from 1.68 yesterday.    Past Medical History  Diagnosis Date  . Grave's disease   . Allergy   . Asthma   . Hypertension   . Substance abuse   . Graves disease   . Acute combined systolic and diastolic heart failure 03/11/2013  . CHF (congestive heart failure)   . Shortness of breath     with asthma  . Arthritis   . Cardiomyopathy    Past Surgical History  Procedure Laterality Date  . Cardiac catheterization    . Anterior cervical decomp/discectomy fusion N/A 07/30/2013    Procedure: CERVICAL FIVE,CERVICAL SIX CORPECTOMY,ANTERIOR ARTHRODESIS,PEEK INTERBODY GRAFT CERVICAL FOUR-CERVICAL SEVEN,ANTERIOR INSTRUMENTATION;  Surgeon: Carmela Hurt, MD;  Location: [facility];  Service: Neurosurgery;  Laterality: N/A;    Medications: Cipro, albuterol, aspirin, hydralazine, levothyroxine, multivitamins, Flomax, thiamine, tramadol Allergies:  Allergies  Allergen Reactions  . Keflex [Cephalexin] Anaphylaxis  . Aspirin     Exacerbates asthma attacks.      History reviewed. No pertinent family history. Social History:  reports that he quit smoking about 18 years ago. He has never used smokeless tobacco. He reports that he does not drink alcohol or use illicit drugs.  ROS: All systems are reviewed and negative except as noted.   Physical Exam:  Vital signs in last 24 hours: Temp:  [97.9 F (36.6 C)-98.7 F (37.1 C)] 97.9 F (36.6 C) (11/20 1357) Pulse Rate:  [86-102] 89 (11/20 1357) Resp:  [18] 18 (11/20 1357) BP: (110-159)/(70-91) 130/82 mmHg (11/20 1357) SpO2:  [95 %-100 %] 97 % (11/20 1357)  Cardiovascular: Skin warm; not flushed Respiratory: Breaths quiet; no shortness of breath Abdomen: No masses Neurological: Normal sensation to touch Musculoskeletal: Normal motor function arms and legs Lymphatics: No inguinal adenopathy Skin: No rashes Genitourinary:Penis is normal.  Scrotum is normal in appearance  Laboratory Data:  Results for orders placed during the hospital encounter of 08/13/13 (from the past 72 hour(s))  CULTURE, BLOOD (ROUTINE X 2)     Status: None   Collection Time    08/13/13 11:10 AM      Result Value Range   Specimen Description BLOOD RIGHT ANTECUBITAL     Special Requests BOTTLES DRAWN AEROBIC AND ANAEROBIC 5CC EA     Culture  Setup Time       Value: 08/13/2013 13:50     Performed at [facility]   Culture       Value:  BLOOD CULTURE RECEIVED NO GROWTH TO DATE CULTURE WILL BE HELD FOR 5 DAYS BEFORE ISSUING A FINAL NEGATIVE REPORT     Performed at [facility]   Report Status PENDING    CBC     Status: Abnormal   Collection Time    08/13/13 11:10 AM      Result Value Range   WBC 9.8  4.0 - 10.5 K/uL   RBC 4.23  4.22 - 5.81 MIL/uL   Hemoglobin 12.2 (*) 13.0 - 17.0 g/dL   HCT 16.1 (*) 09.6 - 04.5 %   MCV 86.1  78.0 - 100.0 fL   MCH 28.8  26.0 - 34.0 pg   MCHC 33.5  30.0 - 36.0 g/dL   RDW 40.9  81.1 - 91.4 %   Platelets 328  150 - 400 K/uL  COMPREHENSIVE METABOLIC PANEL      Status: Abnormal   Collection Time    08/13/13 11:10 AM      Result Value Range   Sodium 133 (*) 135 - 145 mEq/L   Potassium 3.8  3.5 - 5.1 mEq/L   Chloride 95 (*) 96 - 112 mEq/L   CO2 25  19 - 32 mEq/L   Glucose, Bld 145 (*) 70 - 99 mg/dL   BUN 40 (*) 6 - 23 mg/dL   Creatinine, Ser 7.82 (*) 0.50 - 1.35 mg/dL   Calcium 95.6  8.4 - 21.3 mg/dL   Total Protein 7.7  6.0 - 8.3 g/dL   Albumin 4.1  3.5 - 5.2 g/dL   AST 18  0 - 37 U/L   ALT 15  0 - 53 U/L   Alkaline Phosphatase 99  39 - 117 U/L   Total Bilirubin 0.3  0.3 - 1.2 mg/dL   GFR calc non Af Amer 42 (*) >90 mL/min   GFR calc Af Amer 49 (*) >90 mL/min   Comment: (NOTE)     The eGFR has been calculated using the CKD EPI equation.     This calculation has not been validated in all clinical situations.     eGFR's persistently <90 mL/min signify possible Chronic Kidney     Disease.  LACTIC ACID, PLASMA     Status: None   Collection Time    08/13/13 11:10 AM      Result Value Range   Lactic Acid, Venous 1.5  0.5 - 2.2 mmol/L  CORTISOL     Status: None   Collection Time    08/13/13 11:10 AM      Result Value Range   Cortisol, Plasma 43.1     Comment: (NOTE)     AM:  4.3 - 22.4 ug/dL     PM:  3.1 - 08.6 ug/dL     Performed at [facility]  TROPONIN I     Status: None   Collection Time    08/13/13 11:10 AM      Result Value Range   Troponin I <0.30  <0.30 ng/mL   Comment:            Due to the release kinetics of cTnI,     a negative result within the first hours     of the onset of symptoms does not rule out     myocardial infarction with certainty.     If myocardial infarction is still suspected,     repeat the test at appropriate intervals.  PROTIME-INR     Status: None   Collection Time  08/13/13 11:10 AM      Result Value Range   Prothrombin Time 12.9  11.6 - 15.2 seconds   INR 0.99  0.00 - 1.49  APTT     Status: None   Collection Time    08/13/13 11:10 AM      Result Value Range   aPTT 24  24 - 37  seconds  FIBRINOGEN     Status: Abnormal   Collection Time    08/13/13 11:10 AM      Result Value Range   Fibrinogen 628 (*) 204 - 475 mg/dL  TYPE AND SCREEN     Status: None   Collection Time    08/13/13 11:10 AM      Result Value Range   ABO/RH(D) O POS     Antibody Screen NEG     Sample Expiration 08/16/2013    ABO/RH     Status: None   Collection Time    08/13/13 11:10 AM      Result Value Range   ABO/RH(D) O POS    CULTURE, BLOOD (ROUTINE X 2)     Status: None   Collection Time    08/13/13 11:15 AM      Result Value Range   Specimen Description BLOOD LEFT ANTECUBITAL     Special Requests BOTTLES DRAWN AEROBIC AND ANAEROBIC 5CC EA     Culture  Setup Time       Value: 08/13/2013 13:50     Performed at [facility]   Culture       Value:        BLOOD CULTURE RECEIVED NO GROWTH TO DATE CULTURE WILL BE HELD FOR 5 DAYS BEFORE ISSUING A FINAL NEGATIVE REPORT     Performed at [facility]   Report Status PENDING    URINALYSIS, ROUTINE W REFLEX MICROSCOPIC     Status: Abnormal   Collection Time    08/13/13  1:27 PM      Result Value Range   Color, Urine RED (*) YELLOW   Comment: BIOCHEMICALS MAY BE AFFECTED BY COLOR   APPearance TURBID (*) CLEAR   Specific Gravity, Urine 1.028  1.005 - 1.030   pH 6.0  5.0 - 8.0   Glucose, UA NEGATIVE  NEGATIVE mg/dL   Hgb urine dipstick LARGE (*) NEGATIVE   Bilirubin Urine NEGATIVE  NEGATIVE   Ketones, ur NEGATIVE  NEGATIVE mg/dL   Protein, ur >098 (*) NEGATIVE mg/dL   Urobilinogen, UA 1.0  0.0 - 1.0 mg/dL   Nitrite POSITIVE (*) NEGATIVE   Leukocytes, UA LARGE (*) NEGATIVE  URINE CULTURE     Status: None   Collection Time    08/13/13  1:27 PM      Result Value Range   Specimen Description URINE, CLEAN CATCH     Special Requests Normal     Culture  Setup Time       Value: 08/13/2013 17:40     Performed at [facility] Count       Value: >=100,000 COLONIES/ML     Performed at [facility]    Culture       Value: ESCHERICHIA COLI     Performed at [facility]   Report Status PENDING    URINE MICROSCOPIC-ADD ON     Status: Abnormal   Collection Time    08/13/13  1:27 PM      Result Value Range   Squamous Epithelial / LPF RARE  RARE   WBC, UA TOO NUMEROUS TO COUNT  <3 WBC/hpf   RBC / HPF TOO NUMEROUS TO COUNT  <3 RBC/hpf   Bacteria, UA MANY (*) RARE   Urine-Other LESS THAN 10 mL OF URINE SUBMITTED    TROPONIN I     Status: None   Collection Time    08/13/13  8:58 PM      Result Value Range   Troponin I <0.30  <0.30 ng/mL   Comment:            Due to the release kinetics of cTnI,     a negative result within the first hours     of the onset of symptoms does not rule out     myocardial infarction with certainty.     If myocardial infarction is still suspected,     repeat the test at appropriate intervals.  CULTURE, EXPECTORATED SPUTUM-ASSESSMENT     Status: None   Collection Time    08/13/13 10:51 PM      Result Value Range   Specimen Description SPUTUM     Special Requests Normal     Sputum evaluation       Value: THIS SPECIMEN IS ACCEPTABLE. RESPIRATORY CULTURE REPORT TO FOLLOW.   Report Status 08/13/2013 FINAL    CULTURE, RESPIRATORY (NON-EXPECTORATED)     Status: None   Collection Time    08/13/13 10:51 PM      Result Value Range   Specimen Description SPUTUM     Special Requests NONE     Gram Stain       Value: RARE WBC PRESENT, PREDOMINANTLY PMN     RARE SQUAMOUS EPITHELIAL CELLS PRESENT     RARE GRAM POSITIVE COCCI     IN PAIRS RARE GRAM POSITIVE RODS     Performed at [facility]   Culture PENDING     Report Status PENDING    BASIC METABOLIC PANEL     Status: Abnormal   Collection Time    08/14/13  2:20 AM      Result Value Range   Sodium 136  135 - 145 mEq/L   Potassium 3.9  3.5 - 5.1 mEq/L   Chloride 102  96 - 112 mEq/L   CO2 24  19 - 32 mEq/L   Glucose, Bld 82  70 - 99 mg/dL   BUN 32 (*) 6 - 23 mg/dL   Creatinine, Ser  4.09  0.50 - 1.35 mg/dL   Calcium 9.6  8.4 - 81.1 mg/dL   GFR calc non Af Amer 69 (*) >90 mL/min   GFR calc Af Amer 80 (*) >90 mL/min   Comment: (NOTE)     The eGFR has been calculated using the CKD EPI equation.     This calculation has not been validated in all clinical situations.     eGFR's persistently <90 mL/min signify possible Chronic Kidney     Disease.  CBC     Status: Abnormal   Collection Time    08/14/13  2:20 AM      Result Value Range   WBC 8.5  4.0 - 10.5 K/uL   RBC 3.87 (*) 4.22 - 5.81 MIL/uL   Hemoglobin 11.0 (*) 13.0 - 17.0 g/dL   HCT 91.4 (*) 78.2 - 95.6 %   MCV 85.3  78.0 - 100.0 fL   MCH 28.4  26.0 - 34.0 pg   MCHC 33.3  30.0 - 36.0 g/dL   RDW 21.3  08.6 -  15.5 %   Platelets 299  150 - 400 K/uL  TROPONIN I     Status: None   Collection Time    08/14/13  2:20 AM      Result Value Range   Troponin I <0.30  <0.30 ng/mL   Comment:            Due to the release kinetics of cTnI,     a negative result within the first hours     of the onset of symptoms does not rule out     myocardial infarction with certainty.     If myocardial infarction is still suspected,     repeat the test at appropriate intervals.   Recent Results (from the past 240 hour(s))  CULTURE, BLOOD (ROUTINE X 2)     Status: None   Collection Time    08/13/13 11:10 AM      Result Value Range Status   Specimen Description BLOOD RIGHT ANTECUBITAL   Final   Special Requests BOTTLES DRAWN AEROBIC AND ANAEROBIC 5CC EA   Final   Culture  Setup Time     Final   Value: 08/13/2013 13:50     Performed at [facility]   Culture     Final   Value:        BLOOD CULTURE RECEIVED NO GROWTH TO DATE CULTURE WILL BE HELD FOR 5 DAYS BEFORE ISSUING A FINAL NEGATIVE REPORT     Performed at [facility]   Report Status PENDING   Incomplete  CULTURE, BLOOD (ROUTINE X 2)     Status: None   Collection Time    08/13/13 11:15 AM      Result Value Range Status   Specimen Description BLOOD LEFT  ANTECUBITAL   Final   Special Requests BOTTLES DRAWN AEROBIC AND ANAEROBIC 5CC EA   Final   Culture  Setup Time     Final   Value: 08/13/2013 13:50     Performed at [facility]   Culture     Final   Value:        BLOOD CULTURE RECEIVED NO GROWTH TO DATE CULTURE WILL BE HELD FOR 5 DAYS BEFORE ISSUING A FINAL NEGATIVE REPORT     Performed at [facility]   Report Status PENDING   Incomplete  URINE CULTURE     Status: None   Collection Time    08/13/13  1:27 PM      Result Value Range Status   Specimen Description URINE, CLEAN CATCH   Final   Special Requests Normal   Final   Culture  Setup Time     Final   Value: 08/13/2013 17:40     Performed at [facility] Count     Final   Value: >=100,000 COLONIES/ML     Performed at [facility]   Culture     Final   Value: ESCHERICHIA COLI     Performed at [facility]   Report Status PENDING   Incomplete  CULTURE, EXPECTORATED SPUTUM-ASSESSMENT     Status: None   Collection Time    08/13/13 10:51 PM      Result Value Range Status   Specimen Description SPUTUM   Final   Special Requests Normal   Final   Sputum evaluation     Final   Value: THIS SPECIMEN IS ACCEPTABLE. RESPIRATORY CULTURE REPORT TO FOLLOW.   Report Status 08/13/2013 FINAL   Final  CULTURE, RESPIRATORY (NON-EXPECTORATED)     Status: None   Collection Time    08/13/13 10:51 PM      Result Value Range Status   Specimen Description SPUTUM   Final   Special Requests NONE   Final   Gram Stain     Final   Value: RARE WBC PRESENT, PREDOMINANTLY PMN     RARE SQUAMOUS EPITHELIAL CELLS PRESENT     RARE GRAM POSITIVE COCCI     IN PAIRS RARE GRAM POSITIVE RODS     Performed at [facility]   Culture PENDING   Incomplete   Report Status PENDING   Incomplete   Creatinine:  Recent Labs  08/13/13 1110 08/14/13 0220  CREATININE 1.68* 1.12    Xrays: See report/chart   Impression/Assessment:  BPH.  Neurogenic  bladder. E.coli UTI.  Plan:  If unable to void insert # 16 Fr Foley.  Continue Flomax. Continue Cipro Can go home with Foley to be followed by Dr Mena Goes as outpatient  Peter [name] 08/14/2013, 4:53 PM    CC: Dr Creola Corn

## 2013-08-14 NOTE — Evaluation (Addendum)
Occupational Therapy Evaluation Patient Details Name: Peter [name] MRN: [id number] DOB: 11-06-1950 Today's Date: 08/14/2013 Time: 4098-1191 OT Time Calculation (min): 16 min  OT Assessment / Plan / Recommendation History of present illness 62 yo male admitted with dizziness. S/p C5-C6 corpectomy, C4-C7 ACDF 11/5. Recent d/c from [facility].    Clinical Impression   Pt was admitted with the above.  He will benefit from skilled OT to increase strength in bil UEs to increase independence with adls.  Pt has not started OP OT yet.  Reviewed a HEP, and will return with written program.      OT Assessment  Patient needs continued OT Services    Follow Up Recommendations  Outpatient OT    Barriers to Discharge      Equipment Recommendations       Recommendations for Other Services    Frequency  Min 2X/week    Precautions / Restrictions Precautions Precautions: Cervical;Fall Precaution Comments: pt did not have collar on:  states he has been wearing it intermittently; placed on Required Braces or Orthoses: Cervical Brace Cervical Brace: Hard collar;At all times Restrictions Weight Bearing Restrictions: No   Pertinent Vitals/Pain No c/o pain.  During elbow flexion at eob, c/o tightness in muscles; repositioned for increased comfort    ADL  Grooming: Set up Where Assessed - Grooming: Supported sitting Upper Body Bathing: Minimal assistance Where Assessed - Upper Body Bathing: Supported sitting Upper Body Dressing: Minimal assistance Where Assessed - Upper Body Dressing: Supported sitting Toilet Transfer: Radiographer, therapeutic Method: Sit to Barista:  (bed) Equipment Used: Rolling walker Transfers/Ambulation Related to ADLs: ambulated with supervision, cues for objects due to collar affecting decreased vision at feet when up to objects ADL Comments: Pt reports he often wears PJ bottoms: and has help as needed.   fasteners can be problematic.   Pt has difficulty feeding self--gets tired.  Has been using left hand with right assisting.  Discussed elbow on table to decrease fatique.      OT Diagnosis: Generalized weakness  OT Problem List: Decreased strength;Decreased range of motion;Impaired UE functional use OT Treatment Interventions: Self-care/ADL training;Therapeutic exercise;Therapeutic activities;Patient/family education   OT Goals(Current goals can be found in the care plan section) Acute Rehab OT Goals Patient Stated Goal: get these arms working better OT Goal Formulation: With patient Time For Goal Achievement: 08/28/13 Potential to Achieve Goals: Good ADL Goals Pt Will Perform Eating: with set-up;sitting (elbows supported on table, with min c/o fatique)  Visit Information  Last OT Received On: 08/14/13 Assistance Needed: +1 History of Present Illness: 62 yo male admitted with dizziness. S/p C5-C6 corpectomy, C4-C7 ACDF 11/5. Recent d/c from [facility].        Prior Functioning     Home Living Family/patient expects to be discharged to:: Private residence Living Arrangements: Spouse/significant other Available Help at Discharge: Family;Available 24 hours/day Type of Home: House Home Access: Stairs to enter [employer] of Steps: 1 Home Layout: One level Home Equipment: None Prior Function Level of Independence: Independent with assistive device(s) Comments: uses walker Communication Communication: No difficulties Dominant Hand: Right          Vision/Perception     Cognition  Cognition Arousal/Alertness: Awake/alert Behavior During Therapy: WFL for tasks assessed/performed Overall Cognitive Status: Within Functional Limits for tasks assessed    Extremity/Trunk Assessment Upper Extremity Assessment  RUE Deficits / Details: Pt only able to flex aproximately 30 degrees arom; aarom to 90 tolerated well.  Able to flex/ext elbow but  has stretching sensation when sitting unsupported.  When  supine, no stretching; wrist wfls; hand lacks finger extension (approx -30).  When grasping, tends to recruit wrist also. RUE Sensation:  (reports no decreased sensation) LUE Deficits / Details: AROM shoulder flexion to 90; elbow (see RUE), wrist WFLs, hand:  lacks flexion on 2nd digit (variable, generally able to flex MCP to 90, PIP approximately 30; DIP approximately 20.  Cannot oppose to 5th digit on either hand LUE Sensation:  (reports wfls) Cervical / Trunk Assessment Cervical / Trunk Assessment: Normal     Mobility Bed Mobility Bed Mobility: Supine to Sit Supine to Sit: HOB elevated;6: Modified independent (Device/Increase time) Transfers Sit to Stand: 6: Modified independent (Device/Increase time);From bed Stand to Sit: 6: Modified independent (Device/Increase time);To bed     Exercise Other Exercises Other Exercises: worked through HEP:  will bring handout later.  R shoulder AAROM to 90 and R abduction in gravity eliminated; bil elbows AROM supine vs sitting with arms supported on table; bil hand flexion, extension with focus on R extension actively and using putty; L flexion index finger working at each joint then composite.      Balance     End of Session OT - End of Session Activity Tolerance: Patient tolerated treatment well Patient left: in bed;with call bell/phone within reach;with family/visitor present  GO Functional Assessment Tool Used: clinical judgment/observation Functional Limitation: Self care Self Care Current Status (W0981): At least 20 percent but less than 40 percent impaired, limited or restricted Self Care Goal Status (X9147): At least 20 percent but less than 40 percent impaired, limited or restricted   Sharon Stapel 08/14/2013, 4:19 PM Marica Otter, OTR/L [phone] 08/14/2013

## 2013-08-14 NOTE — Evaluation (Signed)
Physical Therapy Evaluation Patient Details Name: Peter [name] MRN: [id number] DOB: 04/17/1951 Today's Date: 08/14/2013 Time: 4098-1191 PT Time Calculation (min): 15 min  PT Assessment / Plan / Recommendation History of Present Illness  62 yo male admitted with dizziness. S/p C5-C6 corpectomy, C4-C7 ACDF 11/5. Recent d/c from [facility].   Clinical Impression  On eval, pt required supervision level assist for mobility-able to ambulate ~1000 feet with walker. Continues to demonstrate some gait abnormalities due to residual weakness of R LE. Recommend OP PT to improve strength, balance, and overall functional mobility in order to regain independence.     PT Assessment  Patient needs continued PT services    Follow Up Recommendations  Outpatient PT    Does the patient have the potential to tolerate intense rehabilitation      Barriers to Discharge        Equipment Recommendations  None recommended by PT    Recommendations for Other Services     Frequency Min 3X/week    Precautions / Restrictions Precautions Precautions: Cervical;Fall Precaution Comments: Pt found in room without collar. Educated pt on previous recommendations that pt wear collar at all times.  Required Braces or Orthoses: Cervical Brace Cervical Brace: Hard collar;At all times Restrictions Weight Bearing Restrictions: No   Pertinent Vitals/Pain No c/o pain     Mobility  Bed Mobility Bed Mobility: Supine to Sit Supine to Sit: HOB elevated;6: Modified independent (Device/Increase time) Transfers Transfers: Sit to Stand;Stand to Sit Sit to Stand: 6: Modified independent (Device/Increase time);From bed Stand to Sit: 6: Modified independent (Device/Increase time);To bed Ambulation/Gait Ambulation/Gait Assistance: 5: Supervision Ambulation Distance (Feet): 1000 Feet Assistive device: Rolling walker Ambulation/Gait Assistance Details: VCs for safety, avoiding obstacles. Pt intermittently bumped into objects  in environment with walker. slight steppage gait pattern noted on R side Gait Pattern: Decreased dorsiflexion - right    Exercises     PT Diagnosis: Difficulty walking;Abnormality of gait  PT Problem List: Decreased strength;Decreased range of motion;Decreased mobility;Decreased knowledge of precautions;Decreased knowledge of use of DME PT Treatment Interventions: Gait training;Functional mobility training;Therapeutic activities;Therapeutic exercise;Patient/family education     PT Goals(Current goals can be found in the care plan section) Acute Rehab PT Goals Patient Stated Goal: Move normally again.  regain independence PT Goal Formulation: With patient Time For Goal Achievement: 08/28/13 Potential to Achieve Goals: Good  Visit Information  Last PT Received On: 08/14/13 Assistance Needed: +1 History of Present Illness: 62 yo male admitted with dizziness. S/p C5-C6 corpectomy, C4-C7 ACDF 11/5. Recent d/c from [facility].        Prior Functioning  Home Living Family/patient expects to be discharged to:: Private residence Living Arrangements: Spouse/significant other Available Help at Discharge: Family;Available 24 hours/day Type of Home: House Home Access: Stairs to enter [employer] of Steps: 1 Home Layout: One level Home Equipment: None Prior Function Level of Independence: Independent with assistive device(s) Comments: uses walker Communication Communication: No difficulties    Cognition  Cognition Arousal/Alertness: Awake/alert Behavior During Therapy: WFL for tasks assessed/performed Overall Cognitive Status: Within Functional Limits for tasks assessed    Extremity/Trunk Assessment Upper Extremity Assessment Upper Extremity Assessment: Defer to OT evaluation Lower Extremity Assessment RLE Deficits / Details: DF/PF 3-/5, knee ext at least 3/5, hip flex at least 3/5 RLE Coordination: decreased fine motor;decreased gross motor Cervical / Trunk  Assessment Cervical / Trunk Assessment: Normal   Balance    End of Session PT - End of Session Activity Tolerance: Patient tolerated treatment well Patient left: in  bed;with call bell/phone within reach (with OT)  GP Functional Assessment Tool Used: clinical judgement Functional Limitation: Mobility: Walking and moving around Mobility: Walking and Moving Around Current Status [phone]): At least 1 percent but less than 20 percent impaired, limited or restricted Mobility: Walking and Moving Around Goal Status [phone]): 0 percent impaired, limited or restricted   Rebeca Alert, MPT Pager: [phone]

## 2013-08-15 LAB — URINE CULTURE
Colony Count: >=100000
Special Requests: NORMAL

## 2013-08-15 LAB — CBC
HCT: 31.5 % — ABNORMAL LOW (ref 39.0–52.0)
MCHC: 34.6 g/dL (ref 30.0–36.0)
MCV: 84.9 fL (ref 78.0–100.0)
RDW: 13 % (ref 11.5–15.5)
WBC: 6.3 10*3/uL (ref 4.0–10.5)

## 2013-08-15 LAB — BASIC METABOLIC PANEL
BUN: 25 mg/dL — ABNORMAL HIGH (ref 6–23)
CO2: 26 mEq/L (ref 19–32)
Chloride: 100 mEq/L (ref 96–112)
Creatinine, Ser: 0.98 mg/dL (ref 0.50–1.35)
GFR calc non Af Amer: 87 mL/min — ABNORMAL LOW (ref >90)
Sodium: 136 mEq/L (ref 135–145)

## 2013-08-15 MED ORDER — CARVEDILOL 6.25 MG PO TABS
6.2500 mg | ORAL_TABLET | Freq: Two times a day (BID) | ORAL | Status: DC
  Administered 2013-08-15: 6.25 mg via ORAL
  Filled 2013-08-15 (×3): qty 1

## 2013-08-15 MED ORDER — TAMSULOSIN HCL 0.4 MG PO CAPS: 0.4000 mg | ORAL_CAPSULE | Freq: Every day | ORAL | Status: DC

## 2013-08-15 MED ORDER — CARVEDILOL 6.25 MG PO TABS: 12.5000 mg | ORAL_TABLET | Freq: Two times a day (BID) | ORAL | Status: DC

## 2013-08-15 NOTE — Progress Notes (Signed)
Patient ID: Peter [name], male   DOB: 1951/06/02, 62 y.o.   MRN: [id number]  Pt feels well. Foley draining well.   PE: NAD Abd - soft, NT Ext - no CCE GU - foley in place, urine clear  Imp - urinary retention Plan- -discussed with patient - continue foley, need cysto and Urodynamics in outpt.  -agree with short course abx to cover e coli.  -Discussed pt with Dr. Timothy Lasso

## 2013-08-15 NOTE — Progress Notes (Signed)
OT Cancellation Note  Patient Details Name: Peter [name] MRN: [id number] DOB: [birth year]   Cancelled Treatment:    Reason Eval/Treat Not Completed: Other (comment)   Pt slept poorly and did not want to work through [employer].  Will follow up with OP OT; written program provided yesterday.     Peter [name] 08/15/2013, 9:12 AM Peter [name], OTR/L [phone] 08/15/2013

## 2013-08-15 NOTE — Discharge Summary (Signed)
Physician Discharge Summary  DISCHARGE SUMMARY   Patient ID: Peter [name] MR#: [id number] DOB/AGE: 61-Nov-1952 61 y.o.   Attending Physician:Connor Meacham M  Patient's WUJ:WJXBJ,YNWG M, MD  Consults:Treatment Team:  Antony Haste, MD None  Admit date: 08/13/2013 Discharge date: 08/15/2013  Discharge Diagnoses:  Principal Problem:   Dizziness and giddiness Active Problems:   Acute combined systolic and diastolic heart failure   HTN (hypertension)   Acute renal insufficiency   Patient Active Problem List   Diagnosis Date Noted  . Dizziness and giddiness 08/13/2013    Class: Acute  . Acute renal insufficiency 08/13/2013    Class: Acute  . Spondylosis, cervical, with myelopathy 08/01/2013  . Weakness 06/03/2013  . Acute combined systolic and diastolic heart failure 03/11/2013  . Volume overload 03/11/2013  . HTN (hypertension) 03/11/2013  . Abnormal CK 03/11/2013  . DOE (dyspnea on exertion) 03/11/2013  . Unspecified hypothyroidism 03/11/2013   Past Medical History  Diagnosis Date  . Grave's disease   . Allergy   . Asthma   . Hypertension   . Substance abuse   . Graves disease   . Acute combined systolic and diastolic heart failure 03/11/2013  . CHF (congestive heart failure)   . Shortness of breath     with asthma  . Arthritis   . Cardiomyopathy     Discharged Condition: Stable   Discharge Medications:   Medication List    STOP taking these medications       ibuprofen 600 MG tablet  Commonly known as:  ADVIL,MOTRIN     olmesartan-hydrochlorothiazide 40-25 MG per tablet  Commonly known as:  BENICAR HCT      TAKE these medications       acetaminophen 500 MG tablet  Commonly known as:  TYLENOL  Take 1,500 mg by mouth 2 (two) times daily as needed for pain.     albuterol 108 (90 BASE) MCG/ACT inhaler  Commonly known as:  PROVENTIL HFA;VENTOLIN HFA  Inhale 2 puffs into the lungs every 6 (six) hours as needed for wheezing.      aspirin EC 81 MG tablet  Take 81 mg by mouth 2 (two) times daily.     carvedilol 6.25 MG tablet  Commonly known as:  COREG  Take 2 tablets (12.5 mg total) by mouth 2 (two) times daily with a meal.     ciprofloxacin 500 MG tablet  Commonly known as:  CIPRO  Take 1 tablet (500 mg total) by mouth 2 (two) times daily.     digoxin 0.25 MG tablet  Commonly known as:  LANOXIN  Take 0.5 tablets (0.125 mg total) by mouth daily.  Start taking on:  08/18/2013     furosemide 20 MG tablet  Commonly known as:  LASIX  Take 1 tablet (20 mg total) by mouth daily.  Start taking on:  08/25/2013     HYDROcodone-acetaminophen 5-325 MG per tablet  Commonly known as:  NORCO/VICODIN  Take 1 tablet by mouth every 6 (six) hours as needed for moderate pain or severe pain.     isosorbide-hydrALAZINE 20-37.5 MG per tablet  Commonly known as:  BIDIL  Take 1 tablet by mouth 2 (two) times daily.     levothyroxine 175 MCG tablet  Commonly known as:  SYNTHROID, LEVOTHROID  Take 175 mcg by mouth daily before breakfast. Takes 175mg  and 50mg  to equal 225mg  daily     levothyroxine 50 MCG tablet  Commonly known as:  SYNTHROID, LEVOTHROID  Take 50 mcg by mouth daily  before breakfast.     multivitamin with minerals Tabs tablet  Take 1 tablet by mouth daily.     senna-docusate 8.6-50 MG per tablet  Commonly known as:  Senokot-S  Take 1 tablet by mouth at bedtime as needed for mild constipation.     tamsulosin 0.4 MG Caps capsule  Commonly known as:  FLOMAX  Take 1 capsule (0.4 mg total) by mouth daily after supper.     thiamine 100 MG tablet  Commonly known as:  VITAMIN B-1  Take 100 mg by mouth daily.     traMADol 50 MG tablet  Commonly known as:  ULTRAM  Take 50 mg by mouth every 6 (six) hours as needed for pain.        Hospital Procedures: Dg Cervical Spine 2-3 Views  07/30/2013   CLINICAL DATA:  C4-C7 ACDF.  EXAM: CERVICAL SPINE - 2-3 VIEW  COMPARISON:  None.  FINDINGS: A surgical device tip  projects at the level of the C4-5 disc space. Partial visualization of the anterior fusion plate and screw fixator is appreciated at C4 and C5. An NG tube and nasogastric tube are appreciated.  IMPRESSION: C4 through C7 ACDF   Electronically Signed   By: Salome Holmes M.D.   On: 07/30/2013 13:50   Dg Chest Portable 1 View  08/13/2013   CLINICAL DATA:  Shortness of breath and sepsis.  EXAM: PORTABLE CHEST - 1 VIEW  COMPARISON:  03/10/2013.  FINDINGS: Mediastinum and hilar structures are normal. Lungs are clear. Underlying COPD cannot be excluded. No pleural effusion or pneumothorax. Heart size normal. Normal pulmonary vascularity. Prior cervical spine fusion.  IMPRESSION: 1. No active cardiopulmonary disease. 2. COPD cannot be excluded. No evidence of significant pulmonary infiltrate.   Electronically Signed   By: Maisie Fus  Register   On: 08/13/2013 11:50    History of Present Illness: 62 year old black man with several medical problems who had recent cervical spine surgery with discharge from the hospital on 08/10/2013. During that hospitalization he had difficulty voiding, so a Foley cath was placed. 11/19 he had a urologist appointment and while waiting to be seen in check-in he developed diaphoresis, lightheadedness, and weakness in his legs. Vital signs were checked and his blood pressure systolic was around 70, so he was transferred to the emergency room for evaluation. Workup in the emergency room showed initial hypotension and a urinalysis with bacteriuria, so he was started on IV fluids and antibiotics for possible urinary tract infection. Recently he had had a few episodes of left-sided chest discomfort of moderate intensity lasting up to an hour a time with last episode yesterday. He has not had episodes of palpitations and has no history of cardiac arrhythmia. He does have a history of idiopathic dilated cardiomyopathy with left ventricular ejection fraction approximately 20% for which he is on  multiple medications. He has not had a recent change in his medications and has not had recent fever, chills, or abdominal pain. He had not been checking his weight on a daily basis. Since his cervical spine surgery he has been eating and drinking, but he admits that he probably is not drinking as much fluids as usual. His medical history is also significant for a cardiac catheterization in June 2014 that did not show significant coronary artery disease.   Hospital Course: Admitted 11/19 c hypotension, Pre-Syncope, ?UTI, AKI.  He ruled out for MI.  NO Arrhythmia. S/P Fluids. Dig, Losartan vrs Benicar, lasix and Coreg 12.5 BID held Feels better, less  dizzy. CXR was (-) and done b/c mild congestion yesterday - has been using less inhalers.  Breathing better today.  He took till noon 11/20 to urinate and had 600 cc post void residual.  I and O Cath done.  Flomax started.  Dr Julien Girt consulted.  Had seen Dr Mena Goes prior to hospital stay.  We continued to try and keep catheter out.  He failed and had to have it re-inserted.  He will be d/ced c the catheter in place and will follow up c Dr Julien Girt to get it out.  He was sent home c a Rx for Flomax.  The Cx came back E coli UTI and he will finish Cipro Abx. Dr Mena Goes did see him 11/21.  Hypotension/presyncope - Better and now high again. Will be d/ced on much less meds and restart slowly over the next few days. AKI- Better. Cr down to baseline.  U retention - Foley  D/ced and re-started. E coli UTI - finish 3 days Abx.  S/P neck/Cervical spine surgery and weakness - coming along. Resume PT/OT as outpatient.  He had Rx here in house as well. Cardiomyopathy c EF 20%.  Not retaining fluid.  Restart home meds slowly CP - (-) CK/Trop I and EKGs. Recent (-) Cath  Anemia - Mild but worse post hydration. Will follow as outpt.  Will get him ready for d/c and then get him back to office quickly for follow up c labs and following BP.  His correct med list prior  to admission was:  Complete Medication List: 1)  Synthroid 25 Mcg Tabs (Levothyroxine sodium) .Marland Kitchen.. 1 po qd ---takes in addition to qd for total dose of qd 2)  Tramadol Hcl 50 Mg Tabs (Tramadol hcl) .Marland Kitchen.. 1 po bid prn 3)  Losartan Potassium 50 Mg Tabs (Losartan potassium) .Marland Kitchen.. 1 po qd 4)  Bidil 20-37.5 Mg Tabs (Isosorb dinitrate-hydralazine) .Marland Kitchen.. 1 tab po tid 5)  Digox 250 Mcg Tabs (Digoxin) .Marland Kitchen.. 1 tab po qd 6)  Carvedilol  Mg Tabs (Carvedilol) .... One po bid 7)  Ventolin Hfa 108 (90 Base) Mcg/act Aers (Albuterol sulfate) .... Inhale two puffs by mouth three times daily as needed 8)  Tylenol 325 Mg Tabs (Acetaminophen) .... Prn "taking to many" per pt 9)  Furosemide 20 Mg Tabs (Furosemide) .Marland Kitchen.. 1 po daily prn 10)  B Complex-folic Acid Tabs (Folic acid-vit b6-vit b12 tabs) .... 200mg  1 qd 11)  B-12 1000 Mcg Caps (Cyanocobalamin) .... Tid 12)  Synthroid 200 Mcg Tabs (Levothyroxine sodium) .Marland Kitchen.. 1 po qd ---in addition to qd for total of qd 13)  Multivitamins Tabs (Multiple vitamin) .... Take one tablet by mouth every day 14)  Aspirin 81 Mg Ec Tab (Aspirin) .... Take one (1) tablet by mouth daily 15)  Omega 3 Cpdr (Omega-3 fatty acids cpdr) .... Take one tablet by mouth twice daily 16)  Eql Vitamin C 1000 Mg Tabs (Ascorbic acid) .... Take one tablet by mouth once daily    Day of Discharge Exam BP 129/88  Pulse 98  Temp(Src) 98.2 F (36.8 C) (Oral)  Resp 18  Ht 6\' 2"  (1.88 m)  Wt 106.595 kg (235 lb)  BMI 30.16 kg/m2  SpO2 96%  Physical Exam:  General appearance: A and O  Eyes: no scleral icterus  Throat: oropharynx moist without erythema Neck Brace is on currently.  Resp: Upper airway congestion, cough, min rales.  Cardio: Reg 1/6 SEM  GI: soft, non-tender; bowel sounds normal; no  masses, no organomegaly  Extremities: no clubbing, cyanosis or edema  RUE and hand weakness seen but he reports increased strength over last week.    Discharge  Labs:  Recent Labs  08/14/13 0220 08/15/13 0450  NA 136 136  K 3.9 3.6  CL 102 100  CO2 24 26  GLUCOSE 82 98  BUN 32* 25*  CREATININE 1.12 0.98  CALCIUM 9.6 9.9    Recent Labs  08/13/13 1110  AST 18  ALT 15  ALKPHOS 99  BILITOT 0.3  PROT 7.7  ALBUMIN 4.1    Recent Labs  08/14/13 0220 08/15/13 0450  WBC 8.5 6.3  HGB 11.0* 10.9*  HCT 33.0* 31.5*  MCV 85.3 84.9  PLT 299 285    Recent Labs  08/13/13 1110 08/13/13 2058 08/14/13 0220  TROPONINI <0.30 <0.30 <0.30   No results found for this basename: TSH, T4TOTAL, FREET3, T3FREE, THYROIDAB,  in the last 72 hours No results found for this basename: VITAMINB12, FOLATE, FERRITIN, TIBC, IRON, RETICCTPCT,  in the last 72 hours Lab Results  Component Value Date   INR 0.99 08/13/2013   INR 1.00 03/11/2013       Discharge instructions:  01-Home or Self Care Follow-up Information   Follow up with Peter Pounds, MD In 4 days.   Specialty:  Internal Medicine   Contact information:   [address] [facility] [place] [address] [phone]       Follow up with Antony Haste, MD In 7 days.   Specialty:  Urology   Contact information:   [address] [place] [address] [phone]        Disposition: Home  Follow-up Appts: Follow-up with Dr. Timothy Lasso at [facility] in 3-4 days.  Call for appointment.  Condition on Discharge: stable  Tests Needing Follow-up: Labs/BP  Time spent in discharge (includes decision making & examination of pt): 30 min  Signed: Ikenna Ohms M 08/15/2013, 7:34 AM

## 2013-08-16 LAB — CULTURE, RESPIRATORY: Culture: NORMAL

## 2013-08-16 LAB — CULTURE, RESPIRATORY W GRAM STAIN

## 2013-08-19 LAB — CULTURE, BLOOD (ROUTINE X 2)
Culture: NO GROWTH
Culture: NO GROWTH

## 2013-09-02 ENCOUNTER — Ambulatory Visit: Payer: 59 | Attending: Neurosurgery | Admitting: Physical Therapy

## 2013-09-02 DIAGNOSIS — Z5189 Encounter for other specified aftercare: Secondary | ICD-10-CM | POA: Insufficient documentation

## 2013-09-02 DIAGNOSIS — R279 Unspecified lack of coordination: Secondary | ICD-10-CM | POA: Insufficient documentation

## 2013-09-02 DIAGNOSIS — M25519 Pain in unspecified shoulder: Secondary | ICD-10-CM | POA: Insufficient documentation

## 2013-09-02 DIAGNOSIS — M2560 Stiffness of unspecified joint, not elsewhere classified: Secondary | ICD-10-CM | POA: Insufficient documentation

## 2013-09-02 DIAGNOSIS — M2550 Pain in unspecified joint: Secondary | ICD-10-CM | POA: Insufficient documentation

## 2013-09-02 DIAGNOSIS — R269 Unspecified abnormalities of gait and mobility: Secondary | ICD-10-CM | POA: Insufficient documentation

## 2013-09-02 DIAGNOSIS — M6281 Muscle weakness (generalized): Secondary | ICD-10-CM | POA: Insufficient documentation

## 2013-09-08 ENCOUNTER — Ambulatory Visit: Payer: 59 | Admitting: Physical Therapy

## 2013-09-08 ENCOUNTER — Ambulatory Visit: Payer: 59 | Admitting: Occupational Therapy

## 2013-09-11 ENCOUNTER — Ambulatory Visit: Payer: 59 | Admitting: Physical Therapy

## 2013-09-11 ENCOUNTER — Ambulatory Visit: Payer: 59 | Admitting: Occupational Therapy

## 2013-09-15 ENCOUNTER — Ambulatory Visit: Payer: 59 | Admitting: Physical Therapy

## 2013-09-17 ENCOUNTER — Ambulatory Visit: Payer: 59 | Admitting: Physical Therapy

## 2013-09-22 ENCOUNTER — Ambulatory Visit: Payer: 59 | Admitting: Physical Therapy

## 2013-09-22 ENCOUNTER — Ambulatory Visit: Payer: 59 | Admitting: Occupational Therapy

## 2013-09-24 ENCOUNTER — Ambulatory Visit: Payer: 59 | Admitting: Physical Therapy

## 2013-09-25 HISTORY — PX: BACK SURGERY: SHX140

## 2013-09-29 ENCOUNTER — Ambulatory Visit: Payer: 59 | Admitting: Occupational Therapy

## 2013-09-29 ENCOUNTER — Ambulatory Visit: Payer: 59 | Admitting: Physical Therapy

## 2013-10-01 ENCOUNTER — Ambulatory Visit: Payer: 59 | Attending: Neurosurgery | Admitting: Physical Therapy

## 2013-10-01 ENCOUNTER — Ambulatory Visit: Payer: 59 | Admitting: Occupational Therapy

## 2013-10-01 DIAGNOSIS — R279 Unspecified lack of coordination: Secondary | ICD-10-CM | POA: Insufficient documentation

## 2013-10-01 DIAGNOSIS — M2560 Stiffness of unspecified joint, not elsewhere classified: Secondary | ICD-10-CM | POA: Insufficient documentation

## 2013-10-01 DIAGNOSIS — M25519 Pain in unspecified shoulder: Secondary | ICD-10-CM | POA: Insufficient documentation

## 2013-10-01 DIAGNOSIS — R269 Unspecified abnormalities of gait and mobility: Secondary | ICD-10-CM | POA: Insufficient documentation

## 2013-10-01 DIAGNOSIS — M6281 Muscle weakness (generalized): Secondary | ICD-10-CM | POA: Insufficient documentation

## 2013-10-01 DIAGNOSIS — Z5189 Encounter for other specified aftercare: Secondary | ICD-10-CM | POA: Insufficient documentation

## 2013-10-01 DIAGNOSIS — M2550 Pain in unspecified joint: Secondary | ICD-10-CM | POA: Insufficient documentation

## 2013-10-03 ENCOUNTER — Ambulatory Visit: Payer: 59 | Admitting: Occupational Therapy

## 2013-10-06 ENCOUNTER — Ambulatory Visit: Payer: 59 | Admitting: Occupational Therapy

## 2013-10-06 ENCOUNTER — Ambulatory Visit: Payer: 59 | Admitting: Physical Therapy

## 2013-10-06 ENCOUNTER — Other Ambulatory Visit: Payer: Self-pay | Admitting: Neurosurgery

## 2013-10-08 ENCOUNTER — Ambulatory Visit: Payer: 59 | Admitting: Occupational Therapy

## 2013-10-08 ENCOUNTER — Ambulatory Visit: Payer: 59 | Admitting: Physical Therapy

## 2013-10-10 ENCOUNTER — Ambulatory Visit: Payer: 59 | Admitting: Occupational Therapy

## 2013-10-13 ENCOUNTER — Ambulatory Visit: Payer: 59 | Admitting: Occupational Therapy

## 2013-10-14 ENCOUNTER — Encounter (HOSPITAL_COMMUNITY): Payer: Self-pay | Admitting: Pharmacy Technician

## 2013-10-15 ENCOUNTER — Ambulatory Visit: Payer: 59 | Admitting: Occupational Therapy

## 2013-10-17 ENCOUNTER — Encounter (HOSPITAL_COMMUNITY)
Admission: RE | Admit: 2013-10-17 | Discharge: 2013-10-17 | Disposition: A | Discharge status: Home / Self Care | Payer: 59 | Source: Ambulatory Visit | Attending: Neurosurgery | Admitting: Neurosurgery

## 2013-10-17 ENCOUNTER — Encounter (HOSPITAL_COMMUNITY): Payer: Self-pay

## 2013-10-17 DIAGNOSIS — Z01818 Encounter for other preprocedural examination: Secondary | ICD-10-CM | POA: Insufficient documentation

## 2013-10-17 DIAGNOSIS — Z01812 Encounter for preprocedural laboratory examination: Secondary | ICD-10-CM | POA: Insufficient documentation

## 2013-10-17 HISTORY — DX: Adverse effect of unspecified anesthetic, initial encounter: T41.45XA

## 2013-10-17 HISTORY — DX: Other complications of anesthesia, initial encounter: T88.59XA

## 2013-10-17 HISTORY — DX: Hypothyroidism, unspecified: E03.9

## 2013-10-17 LAB — BASIC METABOLIC PANEL
BUN: 17 mg/dL (ref 6–23)
CALCIUM: 9.6 mg/dL (ref 8.4–10.5)
CO2: 26 mEq/L (ref 19–32)
Chloride: 105 mEq/L (ref 96–112)
Creatinine, Ser: 0.82 mg/dL (ref 0.50–1.35)
GFR calc Af Amer: >90 mL/min (ref >90)
GFR calc non Af Amer: >90 mL/min (ref >90)
GLUCOSE: 92 mg/dL (ref 70–99)
Potassium: 4.1 mEq/L (ref 3.7–5.3)
SODIUM: 144 meq/L (ref 137–147)

## 2013-10-17 LAB — ABO/RH: ABO/RH(D): O POS

## 2013-10-17 LAB — CBC
HCT: 36.3 % — ABNORMAL LOW (ref 39.0–52.0)
Hemoglobin: 12.2 g/dL — ABNORMAL LOW (ref 13.0–17.0)
MCH: 28.7 pg (ref 26.0–34.0)
MCHC: 33.6 g/dL (ref 30.0–36.0)
MCV: 85.4 fL (ref 78.0–100.0)
PLATELETS: 249 10*3/uL (ref 150–400)
RBC: 4.25 MIL/uL (ref 4.22–5.81)
RDW: 13.7 % (ref 11.5–15.5)
WBC: 4.6 10*3/uL (ref 4.0–10.5)

## 2013-10-17 LAB — TYPE AND SCREEN
ABO/RH(D): O POS
Antibody Screen: NEGATIVE

## 2013-10-17 LAB — SURGICAL PCR SCREEN
MRSA, PCR: NEGATIVE
Staphylococcus aureus: NEGATIVE

## 2013-10-17 NOTE — Progress Notes (Addendum)
Anesthesia PAT Evaluation: Patient is a 63 year old male scheduled for L4-5 decompression with PLIF on 10/12/13 by Dr. Christella Noa.    History includes obesity, former smoker, Grave's disease s/p RAI therapy, hypothyroidism (on Synthroid), asthma, HTN, non-ischemic dilated cardiomyopathy, combined systolic and diastolic CHF, arthritis, substance abuse (with no current illicit drug use documented), C4-7 ACDF 07/30/13. He did develop post-operative urinary retention and was discharged with a foley catheter and required re-admission for E. Coli UTI 07/2013.  Foley catheter is now out, and he is on Flomax.  PCP is Dr. Shon Baton.   Cardiologist is Dr. Einar Gip at [facility] He was scheduled to see Dr. Einar Gip on 10/24/13, but is now scheduled for surgery. Approximately six weeks ago, Dr. Einar Gip had patient to start wearing a Life Vest.  He had an echo two days ago to determine if it can be discontinued or if he will need referral to EP.  There have been no discharges.  He denies SOB, chest pain, edema.  His weight has been stable.  His activity is very limited due to back pain.  On exam, heart RRR, no murmur noted.  Lungs clear.  No significiant LE edema.   EKG on 08/13/13 showed SR, borderline lateral T wave abnormality.   He reports an echo from 10/15/13.  Report is pending.  Visual EF on 07/14/13 was 20-25%. Echo on 07/14/13 ([facility]) showed LV is borderline dilated. Moderate concentric hypertrophy. Severe global hypokinesis. Septum is more hypokinetic than other walls. Severely decreased systolic global function. Visual EF 20-25%. Doppler evidence of grade I (impaired) diastolic dysfunction with elevated LV filling pressures. LA cavity slightly dilated. Mild aortic valve leaflet thickening. Mild MR. Trace TR. Mild PR.   Cardiac cath on 03/11/13 showed: Normal right heart catheterizaton with preserved cardiac output and cardiac index. No suggestion of intracardiac shunting with normal Qp:Qs ratio. Normal coronary  arteries, findings are suggestive of nonischemic dilated cardiomyopathy severe LV systolic dysfunction. Borderline decrease in cardiac output and cardiac index in a patient who is 6 feet 2 inches tall. LVEF of 20% Dilated LV. Global hypokinesis noted.   He had a low risk nuclear stress test, EF 44% in 05/2011 at [facility].   2V CXR on 03/10/13 showed: Normal sized heart. Tortuous aorta. Clear lungs with bilateral nipple shadows. The lungs are mildly hyperexpanded with mildly prominent interstitial markings. Minimal lower thoracic spine degenerative changes. 1V CXR on 08/14/13 showed no active disease.   Preoperative labs noted.   I told him that since he is now wearing a [employer] forces [employer] and had an echo just two days ago that Dr. Einar Gip will need to review these results and make recommendations prior to his surgery.  Manuela Schwartz at Dr. Lacy Duverney office to follow-up.  I'll review additional records once available.  [facility]/Anesthesiology Phone [phone] 10/17/2013 2:05 PM  Addendum: 10/20/2013 4:07 PM Received cardiac clearance from Dr. Einar Gip.  EF improved to 40-45% by recent echo (see below).  Echo on 10/15/13 showed: Left ventricular internal dimension is borderline dilated. Moderate concentric hypertrophy. Diastolic filling with impaired relaxation pattern and normal to low pressure. Moderate global hypokinesis. Mild to moderately decreased systolic global function. Calculated EF 42%. Doppler evidence of grade 1 (impaired) diastolic dysfunction. Left atrial cavity is mildly dilated. Patent foramen ovale or fenestrated atrial septal defect is probably present. Moderate aneurysmal motion of the interatrial septum. Mitral valve structurally normal. Mild mitral regurgitation. Mitral valve in A > E ratio. Tricuspid valve  structurally normal. Trace tricuspid regurgitation. IVC is dilated with respiratory variation. Suggest elevated right heart pressure. Compared to 07/14/2013, EF  improved from 20-25% to 40-45%.

## 2013-10-17 NOTE — Pre-Procedure Instructions (Signed)
Peter [name]  10/17/2013   Your procedure is scheduled on:  10-22-2013   Wednesday   Report to [facility]  2 * 3 at 6:30 AM.   Call this number if you have problems the morning of surgery: [phone]   Remember:   Do not eat food or drink liquids after midnight.    Take these medicines the morning of surgery with A SIP OF WATER: inhaler as needed,carvedilol,digoxin,pain medication as needed,isosorbide,levothyroxine,   Do not wear jewelry,  Do not wear lotions, powders, or perfumes. .  Do not shave 48 hours prior to surgery. Men may shave face and neck.  Do not bring valuables to the hospital.  [facility] is not responsible for any belongings or valuables.               Contacts, dentures or bridgework may not be worn into surgery.   Leave suitcase in the car. After surgery it may be brought to your room.  For patients admitted to the hospital, discharge time is determined by your treatment team.               Patients discharged the day of surgery will not be allowed to drive home.    Special Instructions: Shower using CHG 2 nights before surgery and the night before surgery.  If you shower the day of surgery use CHG.  Use special wash - you have one bottle of CHG for all showers.  You should use approximately 1/3 of the bottle for each shower.   Please read over the following fact sheets that you were given: Pain Booklet and Surgical Site Infection Prevention

## 2013-10-20 ENCOUNTER — Encounter: Payer: 59 | Admitting: Occupational Therapy

## 2013-10-21 MED ORDER — SODIUM CHLORIDE 0.9 % IV SOLN
1500.0000 mg | INTRAVENOUS | Status: AC
  Administered 2013-10-22: 1500 mg via INTRAVENOUS
  Filled 2013-10-21: qty 1500

## 2013-10-22 ENCOUNTER — Encounter (HOSPITAL_COMMUNITY): Payer: 59 | Admitting: Vascular Surgery

## 2013-10-22 ENCOUNTER — Ambulatory Visit: Payer: 59 | Admitting: Physical Therapy

## 2013-10-22 ENCOUNTER — Inpatient Hospital Stay (HOSPITAL_COMMUNITY): Payer: 59 | Admitting: Anesthesiology

## 2013-10-22 ENCOUNTER — Encounter (HOSPITAL_COMMUNITY): Payer: Self-pay | Admitting: Anesthesiology

## 2013-10-22 ENCOUNTER — Inpatient Hospital Stay (HOSPITAL_COMMUNITY): Payer: 59

## 2013-10-22 ENCOUNTER — Encounter: Payer: 59 | Admitting: Occupational Therapy

## 2013-10-22 ENCOUNTER — Encounter (HOSPITAL_COMMUNITY)
Admission: RE | Disposition: A | Discharge status: Home / Self Care | Payer: Self-pay | Source: Ambulatory Visit | Attending: Neurosurgery

## 2013-10-22 ENCOUNTER — Inpatient Hospital Stay (HOSPITAL_COMMUNITY)
Admission: RE | Admit: 2013-10-22 | Discharge: 2013-10-30 | DRG: 460 | Disposition: A | Discharge status: Home / Self Care | Payer: 59 | Source: Ambulatory Visit | Attending: Neurosurgery | Admitting: Neurosurgery

## 2013-10-22 DIAGNOSIS — R748 Abnormal levels of other serum enzymes: Secondary | ICD-10-CM

## 2013-10-22 DIAGNOSIS — R42 Dizziness and giddiness: Secondary | ICD-10-CM

## 2013-10-22 DIAGNOSIS — E0500 Thyrotoxicosis with diffuse goiter without thyrotoxic crisis or storm: Secondary | ICD-10-CM | POA: Diagnosis present

## 2013-10-22 DIAGNOSIS — I10 Essential (primary) hypertension: Secondary | ICD-10-CM | POA: Diagnosis present

## 2013-10-22 DIAGNOSIS — M4310 Spondylolisthesis, site unspecified: Principal | ICD-10-CM | POA: Diagnosis present

## 2013-10-22 DIAGNOSIS — M4316 Spondylolisthesis, lumbar region: Secondary | ICD-10-CM

## 2013-10-22 DIAGNOSIS — J45909 Unspecified asthma, uncomplicated: Secondary | ICD-10-CM | POA: Diagnosis present

## 2013-10-22 DIAGNOSIS — E039 Hypothyroidism, unspecified: Secondary | ICD-10-CM | POA: Diagnosis present

## 2013-10-22 DIAGNOSIS — N289 Disorder of kidney and ureter, unspecified: Secondary | ICD-10-CM

## 2013-10-22 DIAGNOSIS — I509 Heart failure, unspecified: Secondary | ICD-10-CM | POA: Diagnosis present

## 2013-10-22 DIAGNOSIS — R0609 Other forms of dyspnea: Secondary | ICD-10-CM

## 2013-10-22 DIAGNOSIS — I5041 Acute combined systolic (congestive) and diastolic (congestive) heart failure: Secondary | ICD-10-CM

## 2013-10-22 DIAGNOSIS — E8770 Fluid overload, unspecified: Secondary | ICD-10-CM

## 2013-10-22 DIAGNOSIS — M4712 Other spondylosis with myelopathy, cervical region: Secondary | ICD-10-CM

## 2013-10-22 DIAGNOSIS — M48062 Spinal stenosis, lumbar region with neurogenic claudication: Secondary | ICD-10-CM | POA: Diagnosis present

## 2013-10-22 DIAGNOSIS — R531 Weakness: Secondary | ICD-10-CM

## 2013-10-22 SURGERY — POSTERIOR LUMBAR FUSION 1 LEVEL
Anesthesia: General

## 2013-10-22 MED ORDER — SODIUM CHLORIDE 0.9 % IV SOLN: 250.0000 mL | INTRAVENOUS | Status: DC

## 2013-10-22 MED ORDER — OXYCODONE-ACETAMINOPHEN 5-325 MG PO TABS
1.0000 | ORAL_TABLET | ORAL | Status: DC | PRN
  Administered 2013-10-23 – 2013-10-30 (×24): 2 via ORAL
  Filled 2013-10-22 (×25): qty 2

## 2013-10-22 MED ORDER — DIAZEPAM 5 MG PO TABS
5.0000 mg | ORAL_TABLET | Freq: Four times a day (QID) | ORAL | Status: DC | PRN
  Administered 2013-10-23 – 2013-10-30 (×14): 5 mg via ORAL
  Filled 2013-10-22 (×14): qty 1

## 2013-10-22 MED ORDER — PHENYLEPHRINE HCL 10 MG/ML IJ SOLN
INTRAMUSCULAR | Status: AC
  Filled 2013-10-22: qty 1

## 2013-10-22 MED ORDER — PROPOFOL 10 MG/ML IV BOLUS
INTRAVENOUS | Status: AC
  Filled 2013-10-22: qty 20

## 2013-10-22 MED ORDER — OXYCODONE HCL 5 MG/5ML PO SOLN: 5.0000 mg | Freq: Once | ORAL | Status: AC | PRN

## 2013-10-22 MED ORDER — ARTIFICIAL TEARS OP OINT
TOPICAL_OINTMENT | OPHTHALMIC | Status: AC
  Filled 2013-10-22: qty 3.5

## 2013-10-22 MED ORDER — ARTIFICIAL TEARS OP OINT
TOPICAL_OINTMENT | OPHTHALMIC | Status: DC | PRN
  Administered 2013-10-22: 1 via OPHTHALMIC

## 2013-10-22 MED ORDER — EPHEDRINE SULFATE 50 MG/ML IJ SOLN
INTRAMUSCULAR | Status: AC
  Filled 2013-10-22: qty 1

## 2013-10-22 MED ORDER — LIDOCAINE-EPINEPHRINE 0.5 %-1:200000 IJ SOLN
INTRAMUSCULAR | Status: DC | PRN
  Administered 2013-10-22: 10 mL via INTRADERMAL
  Administered 2013-10-22: 20 mL via INTRADERMAL

## 2013-10-22 MED ORDER — LIDOCAINE HCL (CARDIAC) 20 MG/ML IV SOLN
INTRAVENOUS | Status: DC | PRN
  Administered 2013-10-22: 100 mg via INTRAVENOUS

## 2013-10-22 MED ORDER — ACETAMINOPHEN 325 MG PO TABS: 650.0000 mg | ORAL_TABLET | ORAL | Status: DC | PRN

## 2013-10-22 MED ORDER — FENTANYL CITRATE 0.05 MG/ML IJ SOLN
INTRAMUSCULAR | Status: DC | PRN
  Administered 2013-10-22: 50 ug via INTRAVENOUS
  Administered 2013-10-22: 100 ug via INTRAVENOUS
  Administered 2013-10-22 (×3): 50 ug via INTRAVENOUS
  Administered 2013-10-22: 100 ug via INTRAVENOUS
  Administered 2013-10-22 (×2): 50 ug via INTRAVENOUS

## 2013-10-22 MED ORDER — SENNA 8.6 MG PO TABS
1.0000 | ORAL_TABLET | Freq: Two times a day (BID) | ORAL | Status: DC
  Administered 2013-10-22 – 2013-10-30 (×15): 8.6 mg via ORAL
  Filled 2013-10-22 (×17): qty 1

## 2013-10-22 MED ORDER — ALBUTEROL SULFATE HFA 108 (90 BASE) MCG/ACT IN AERS: 2.0000 | INHALATION_SPRAY | Freq: Four times a day (QID) | RESPIRATORY_TRACT | Status: DC | PRN

## 2013-10-22 MED ORDER — MIDAZOLAM HCL 2 MG/2ML IJ SOLN
INTRAMUSCULAR | Status: AC
  Filled 2013-10-22: qty 2

## 2013-10-22 MED ORDER — FENTANYL CITRATE 0.05 MG/ML IJ SOLN: 50.0000 ug | Freq: Once | INTRAMUSCULAR | Status: DC

## 2013-10-22 MED ORDER — TAMSULOSIN HCL 0.4 MG PO CAPS
0.4000 mg | ORAL_CAPSULE | Freq: Every day | ORAL | Status: DC
  Administered 2013-10-22 – 2013-10-28 (×6): 0.4 mg via ORAL
  Filled 2013-10-22 (×9): qty 1

## 2013-10-22 MED ORDER — IRBESARTAN 150 MG PO TABS
150.0000 mg | ORAL_TABLET | Freq: Every day | ORAL | Status: DC
  Administered 2013-10-23 – 2013-10-28 (×6): 150 mg via ORAL
  Filled 2013-10-22 (×8): qty 1

## 2013-10-22 MED ORDER — 0.9 % SODIUM CHLORIDE (POUR BTL) OPTIME
TOPICAL | Status: DC | PRN
  Administered 2013-10-22: 1000 mL

## 2013-10-22 MED ORDER — DIGOXIN 250 MCG PO TABS
0.2500 mg | ORAL_TABLET | Freq: Every day | ORAL | Status: DC
  Administered 2013-10-23 – 2013-10-30 (×7): 0.25 mg via ORAL
  Filled 2013-10-22 (×8): qty 1

## 2013-10-22 MED ORDER — ALBUMIN HUMAN 5 % IV SOLN
INTRAVENOUS | Status: DC | PRN
  Administered 2013-10-22 (×2): via INTRAVENOUS

## 2013-10-22 MED ORDER — PROMETHAZINE HCL 25 MG/ML IJ SOLN: 6.2500 mg | INTRAMUSCULAR | Status: DC | PRN

## 2013-10-22 MED ORDER — NEOSTIGMINE METHYLSULFATE 1 MG/ML IJ SOLN
INTRAMUSCULAR | Status: DC | PRN
  Administered 2013-10-22: 3 mg via INTRAVENOUS

## 2013-10-22 MED ORDER — HYDROMORPHONE HCL PF 1 MG/ML IJ SOLN
0.2500 mg | INTRAMUSCULAR | Status: DC | PRN
  Administered 2013-10-22 (×2): 0.5 mg via INTRAVENOUS

## 2013-10-22 MED ORDER — ISOSORB DINITRATE-HYDRALAZINE 20-37.5 MG PO TABS
1.0000 | ORAL_TABLET | Freq: Two times a day (BID) | ORAL | Status: DC
  Administered 2013-10-22 – 2013-10-30 (×15): 1 via ORAL
  Filled 2013-10-22 (×17): qty 1

## 2013-10-22 MED ORDER — LEVOTHYROXINE SODIUM 175 MCG PO TABS: 175.0000 ug | ORAL_TABLET | Freq: Every day | ORAL | Status: DC

## 2013-10-22 MED ORDER — GLYCOPYRROLATE 0.2 MG/ML IJ SOLN
INTRAMUSCULAR | Status: AC
  Filled 2013-10-22: qty 2

## 2013-10-22 MED ORDER — MIDAZOLAM HCL 5 MG/5ML IJ SOLN
INTRAMUSCULAR | Status: DC | PRN
  Administered 2013-10-22: 2 mg via INTRAVENOUS

## 2013-10-22 MED ORDER — FENTANYL CITRATE 0.05 MG/ML IJ SOLN
INTRAMUSCULAR | Status: AC
  Filled 2013-10-22: qty 5

## 2013-10-22 MED ORDER — ROCURONIUM BROMIDE 50 MG/5ML IV SOLN
INTRAVENOUS | Status: AC
  Filled 2013-10-22: qty 1

## 2013-10-22 MED ORDER — CARVEDILOL 12.5 MG PO TABS
12.5000 mg | ORAL_TABLET | Freq: Two times a day (BID) | ORAL | Status: DC
  Administered 2013-10-23 – 2013-10-30 (×13): 12.5 mg via ORAL
  Filled 2013-10-22 (×16): qty 1

## 2013-10-22 MED ORDER — DEXTROSE 5 % IV SOLN
10.0000 mg | INTRAVENOUS | Status: DC | PRN
  Administered 2013-10-22: 25 ug/min via INTRAVENOUS

## 2013-10-22 MED ORDER — HYDROCODONE-ACETAMINOPHEN 5-325 MG PO TABS
1.0000 | ORAL_TABLET | ORAL | Status: DC | PRN
  Administered 2013-10-24: 2 via ORAL
  Filled 2013-10-22 (×3): qty 2

## 2013-10-22 MED ORDER — SUCCINYLCHOLINE CHLORIDE 20 MG/ML IJ SOLN
INTRAMUSCULAR | Status: AC
  Filled 2013-10-22: qty 1

## 2013-10-22 MED ORDER — SENNOSIDES-DOCUSATE SODIUM 8.6-50 MG PO TABS
1.0000 | ORAL_TABLET | Freq: Every evening | ORAL | Status: DC | PRN
  Filled 2013-10-22: qty 1

## 2013-10-22 MED ORDER — PHENYLEPHRINE 40 MCG/ML (10ML) SYRINGE FOR IV PUSH (FOR BLOOD PRESSURE SUPPORT)
PREFILLED_SYRINGE | INTRAVENOUS | Status: AC
  Filled 2013-10-22: qty 10

## 2013-10-22 MED ORDER — ASPIRIN EC 81 MG PO TBEC
81.0000 mg | DELAYED_RELEASE_TABLET | Freq: Two times a day (BID) | ORAL | Status: DC
  Administered 2013-10-23 – 2013-10-30 (×15): 81 mg via ORAL
  Filled 2013-10-22 (×16): qty 1

## 2013-10-22 MED ORDER — EPHEDRINE SULFATE 50 MG/ML IJ SOLN
INTRAMUSCULAR | Status: DC | PRN
  Administered 2013-10-22 (×5): 10 mg via INTRAVENOUS

## 2013-10-22 MED ORDER — ZOLPIDEM TARTRATE 5 MG PO TABS: 5.0000 mg | ORAL_TABLET | Freq: Every evening | ORAL | Status: DC | PRN

## 2013-10-22 MED ORDER — MENTHOL 3 MG MT LOZG: 1.0000 | LOZENGE | OROMUCOSAL | Status: DC | PRN

## 2013-10-22 MED ORDER — ONDANSETRON HCL 4 MG/2ML IJ SOLN
INTRAMUSCULAR | Status: AC
  Filled 2013-10-22: qty 2

## 2013-10-22 MED ORDER — POLYETHYLENE GLYCOL 3350 17 G PO PACK
17.0000 g | PACK | Freq: Every day | ORAL | Status: DC | PRN
Start: 2013-10-22 — End: 2013-10-30
  Administered 2013-10-25: 17 g via ORAL
  Filled 2013-10-22: qty 1

## 2013-10-22 MED ORDER — ONDANSETRON HCL 4 MG/2ML IJ SOLN
INTRAMUSCULAR | Status: DC | PRN
Start: 2013-10-22 — End: 2013-10-22
  Administered 2013-10-22: 4 mg via INTRAVENOUS

## 2013-10-22 MED ORDER — ONDANSETRON HCL 4 MG/2ML IJ SOLN: 4.0000 mg | INTRAMUSCULAR | Status: DC | PRN

## 2013-10-22 MED ORDER — ROCURONIUM BROMIDE 100 MG/10ML IV SOLN
INTRAVENOUS | Status: DC | PRN
  Administered 2013-10-22 (×3): 10 mg via INTRAVENOUS
  Administered 2013-10-22: 60 mg via INTRAVENOUS

## 2013-10-22 MED ORDER — B COMPLEX PO TABS: 1.0000 | ORAL_TABLET | Freq: Every day | ORAL | Status: DC

## 2013-10-22 MED ORDER — HYDROMORPHONE HCL PF 1 MG/ML IJ SOLN
INTRAMUSCULAR | Status: AC
  Filled 2013-10-22: qty 1

## 2013-10-22 MED ORDER — SODIUM CHLORIDE 0.9 % IJ SOLN: 3.0000 mL | INTRAMUSCULAR | Status: DC | PRN

## 2013-10-22 MED ORDER — LACTATED RINGERS IV SOLN
INTRAVENOUS | Status: DC | PRN
  Administered 2013-10-22 (×3): via INTRAVENOUS

## 2013-10-22 MED ORDER — SODIUM CHLORIDE 0.9 % IJ SOLN
3.0000 mL | Freq: Two times a day (BID) | INTRAMUSCULAR | Status: DC
  Administered 2013-10-22 – 2013-10-30 (×15): 3 mL via INTRAVENOUS

## 2013-10-22 MED ORDER — PHENOL 1.4 % MT LIQD: 1.0000 | OROMUCOSAL | Status: DC | PRN

## 2013-10-22 MED ORDER — ALBUTEROL SULFATE (2.5 MG/3ML) 0.083% IN NEBU
2.5000 mg | INHALATION_SOLUTION | Freq: Four times a day (QID) | RESPIRATORY_TRACT | Status: DC | PRN
  Administered 2013-10-23: 2.5 mg via RESPIRATORY_TRACT
  Filled 2013-10-22: qty 3

## 2013-10-22 MED ORDER — LEVOTHYROXINE SODIUM 50 MCG PO TABS: 50.0000 ug | ORAL_TABLET | Freq: Every day | ORAL | Status: DC

## 2013-10-22 MED ORDER — ADULT MULTIVITAMIN W/MINERALS CH
1.0000 | ORAL_TABLET | Freq: Every day | ORAL | Status: DC
  Administered 2013-10-23 – 2013-10-30 (×8): 1 via ORAL
  Filled 2013-10-22 (×8): qty 1

## 2013-10-22 MED ORDER — NEOSTIGMINE METHYLSULFATE 1 MG/ML IJ SOLN
INTRAMUSCULAR | Status: AC
  Filled 2013-10-22: qty 10

## 2013-10-22 MED ORDER — ACETAMINOPHEN 650 MG RE SUPP: 650.0000 mg | RECTAL | Status: DC | PRN

## 2013-10-22 MED ORDER — THROMBIN 20000 UNITS EX SOLR
CUTANEOUS | Status: DC | PRN
  Administered 2013-10-22: 10:00:00 via TOPICAL

## 2013-10-22 MED ORDER — FUROSEMIDE 20 MG PO TABS
20.0000 mg | ORAL_TABLET | Freq: Every day | ORAL | Status: DC
  Administered 2013-10-23 – 2013-10-28 (×6): 20 mg via ORAL
  Filled 2013-10-22 (×8): qty 1

## 2013-10-22 MED ORDER — MIDAZOLAM HCL 2 MG/2ML IJ SOLN: 1.0000 mg | INTRAMUSCULAR | Status: DC | PRN

## 2013-10-22 MED ORDER — LEVOTHYROXINE SODIUM 25 MCG PO TABS
225.0000 ug | ORAL_TABLET | Freq: Every day | ORAL | Status: DC
  Administered 2013-10-23 – 2013-10-30 (×8): 225 ug via ORAL
  Filled 2013-10-22 (×9): qty 1

## 2013-10-22 MED ORDER — GLYCOPYRROLATE 0.2 MG/ML IJ SOLN
INTRAMUSCULAR | Status: DC | PRN
  Administered 2013-10-22: 0.4 mg via INTRAVENOUS

## 2013-10-22 MED ORDER — POTASSIUM CHLORIDE IN NACL 20-0.9 MEQ/L-% IV SOLN
INTRAVENOUS | Status: DC
Start: 2013-10-22 — End: 2013-10-30
  Administered 2013-10-22 – 2013-10-23 (×2): via INTRAVENOUS
  Filled 2013-10-22 (×18): qty 1000

## 2013-10-22 MED ORDER — PROPOFOL 10 MG/ML IV BOLUS
INTRAVENOUS | Status: DC | PRN
  Administered 2013-10-22: 180 mg via INTRAVENOUS

## 2013-10-22 MED ORDER — OXYCODONE HCL 5 MG PO TABS
ORAL_TABLET | ORAL | Status: AC
  Filled 2013-10-22: qty 1

## 2013-10-22 MED ORDER — MORPHINE SULFATE 2 MG/ML IJ SOLN
1.0000 mg | INTRAMUSCULAR | Status: DC | PRN
  Administered 2013-10-22 – 2013-10-25 (×8): 2 mg via INTRAVENOUS
  Administered 2013-10-26: 4 mg via INTRAVENOUS
  Administered 2013-10-28 (×2): 2 mg via INTRAVENOUS
  Filled 2013-10-22: qty 1
  Filled 2013-10-22: qty 2
  Filled 2013-10-22 (×9): qty 1

## 2013-10-22 MED ORDER — OXYCODONE HCL 5 MG PO TABS
5.0000 mg | ORAL_TABLET | Freq: Once | ORAL | Status: AC | PRN
  Administered 2013-10-22: 5 mg via ORAL

## 2013-10-22 MED ORDER — VANCOMYCIN HCL 10 G IV SOLR
1250.0000 mg | Freq: Once | INTRAVENOUS | Status: AC
  Administered 2013-10-22: 1250 mg via INTRAVENOUS
  Filled 2013-10-22 (×2): qty 1250

## 2013-10-22 SURGICAL SUPPLY — 64 items
BENZOIN TINCTURE PRP APPL 2/3 (GAUZE/BANDAGES/DRESSINGS) ×3 IMPLANT
BLADE SURG ROTATE 9660 (MISCELLANEOUS) ×3 IMPLANT
BUR MATCHSTICK NEURO 3.0 LAGG (BURR) ×3 IMPLANT
CAGE COROENT MP 11X23X9 (Cage) ×6 IMPLANT
CANISTER SUCT 3000ML (MISCELLANEOUS) ×3 IMPLANT
CLOSURE WOUND 1/2 X4 (GAUZE/BANDAGES/DRESSINGS)
CONT SPEC 4OZ CLIKSEAL STRL BL (MISCELLANEOUS) ×3 IMPLANT
COVER BACK TABLE 24X17X13 BIG (DRAPES) IMPLANT
COVER TABLE BACK 60X90 (DRAPES) ×3 IMPLANT
DECANTER SPIKE VIAL GLASS SM (MISCELLANEOUS) ×3 IMPLANT
DERMABOND ADVANCED (GAUZE/BANDAGES/DRESSINGS) ×2
DERMABOND ADVANCED .7 DNX12 (GAUZE/BANDAGES/DRESSINGS) ×1 IMPLANT
DRAPE C-ARM 42X72 X-RAY (DRAPES) ×6 IMPLANT
DRAPE LAPAROTOMY 100X72X124 (DRAPES) ×3 IMPLANT
DRAPE POUCH INSTRU U-SHP 10X18 (DRAPES) ×3 IMPLANT
DRAPE PROXIMA HALF (DRAPES) ×12 IMPLANT
DRAPE SURG 17X23 STRL (DRAPES) ×3 IMPLANT
DRESSING TELFA 8X3 (GAUZE/BANDAGES/DRESSINGS) IMPLANT
DURAPREP 26ML APPLICATOR (WOUND CARE) ×3 IMPLANT
ELECT REM PT RETURN 9FT ADLT (ELECTROSURGICAL) ×3
ELECTRODE REM PT RTRN 9FT ADLT (ELECTROSURGICAL) ×1 IMPLANT
GAUZE SPONGE 4X4 16PLY XRAY LF (GAUZE/BANDAGES/DRESSINGS) IMPLANT
GLOVE BIOGEL PI IND STRL 7.0 (GLOVE) ×2 IMPLANT
GLOVE BIOGEL PI INDICATOR 7.0 (GLOVE) ×4
GLOVE ECLIPSE 6.5 STRL STRAW (GLOVE) ×6 IMPLANT
GLOVE EXAM NITRILE LRG STRL (GLOVE) IMPLANT
GLOVE EXAM NITRILE MD LF STRL (GLOVE) ×3 IMPLANT
GLOVE EXAM NITRILE XL STR (GLOVE) IMPLANT
GLOVE EXAM NITRILE XS STR PU (GLOVE) IMPLANT
GLOVE OPTIFIT SS 6.5 STRL BRWN (GLOVE) ×12 IMPLANT
GOWN BRE IMP SLV AUR LG STRL (GOWN DISPOSABLE) IMPLANT
GOWN BRE IMP SLV AUR XL STRL (GOWN DISPOSABLE) IMPLANT
GOWN STRL REIN 2XL LVL4 (GOWN DISPOSABLE) IMPLANT
GOWN STRL REUS W/ TWL LRG LVL3 (GOWN DISPOSABLE) ×5 IMPLANT
GOWN STRL REUS W/TWL LRG LVL3 (GOWN DISPOSABLE) ×10
KIT BASIN OR (CUSTOM PROCEDURE TRAY) ×3 IMPLANT
KIT POSITION SURG JACKSON T1 (MISCELLANEOUS) ×3 IMPLANT
KIT ROOM TURNOVER OR (KITS) ×3 IMPLANT
MILL MEDIUM DISP (BLADE) ×3 IMPLANT
NEEDLE HYPO 25X1 1.5 SAFETY (NEEDLE) ×3 IMPLANT
NEEDLE SPNL 18GX3.5 QUINCKE PK (NEEDLE) ×3 IMPLANT
NS IRRIG 1000ML POUR BTL (IV SOLUTION) ×3 IMPLANT
PACK LAMINECTOMY NEURO (CUSTOM PROCEDURE TRAY) ×3 IMPLANT
PAD ARMBOARD 7.5X6 YLW CONV (MISCELLANEOUS) ×6 IMPLANT
ROD 50MM LUMBAR (Rod) ×6 IMPLANT
SCREW LOCK (Screw) ×8 IMPLANT
SCREW LOCK FXNS SPNE MAS PL (Screw) ×4 IMPLANT
SCREW SHANK 5.0X30MM (Screw) ×12 IMPLANT
SCREW TULIP 5.5 (Screw) ×12 IMPLANT
SPONGE GAUZE 4X4 12PLY (GAUZE/BANDAGES/DRESSINGS) IMPLANT
SPONGE LAP 4X18 X RAY DECT (DISPOSABLE) IMPLANT
SPONGE SURGIFOAM ABS GEL 100 (HEMOSTASIS) ×3 IMPLANT
STRIP CLOSURE SKIN 1/2X4 (GAUZE/BANDAGES/DRESSINGS) IMPLANT
SUT PROLENE 6 0 BV (SUTURE) IMPLANT
SUT VIC AB 0 CT1 18XCR BRD8 (SUTURE) ×3 IMPLANT
SUT VIC AB 0 CT1 8-18 (SUTURE) ×6
SUT VIC AB 2-0 CT1 18 (SUTURE) ×6 IMPLANT
SUT VIC AB 3-0 SH 8-18 (SUTURE) ×3 IMPLANT
SYR 20ML ECCENTRIC (SYRINGE) ×3 IMPLANT
TOWEL OR 17X24 6PK STRL BLUE (TOWEL DISPOSABLE) ×3 IMPLANT
TOWEL OR 17X26 10 PK STRL BLUE (TOWEL DISPOSABLE) ×3 IMPLANT
TRAY FOLEY CATH 14FRSI W/METER (CATHETERS) IMPLANT
TRAY FOLEY CATH 16FRSI W/METER (SET/KITS/TRAYS/PACK) ×3 IMPLANT
WATER STERILE IRR 1000ML POUR (IV SOLUTION) ×3 IMPLANT

## 2013-10-22 NOTE — Anesthesia Preprocedure Evaluation (Addendum)
Anesthesia Evaluation  Patient identified by MRN, date of birth, ID band Patient awake    Reviewed: Allergy & Precautions, H&P , NPO status , Patient's Chart, lab work & pertinent test results  Airway Mallampati: II TM Distance: >3 FB Neck ROM: Full    Dental   Pulmonary shortness of breath, asthma , former smoker,  breath sounds clear to auscultation        Cardiovascular hypertension, + DOE + Cardiac Defibrillator Rhythm:Regular Rate:Normal  cardiomyopathy   Neuro/Psych    GI/Hepatic   Endo/Other  Hypothyroidism   Renal/GU      Musculoskeletal   Abdominal   Peds  Hematology   Anesthesia Other Findings   Reproductive/Obstetrics                          Anesthesia Physical Anesthesia Plan  ASA: III  Anesthesia Plan: General   Post-op Pain Management:    Induction: Intravenous  Airway Management Planned: Oral ETT  Additional Equipment:   Intra-op Plan:   Post-operative Plan: Extubation in OR  Informed Consent: I have reviewed the patients History and Physical, chart, labs and discussed the procedure including the risks, benefits and alternatives for the proposed anesthesia with the patient or authorized representative who has indicated his/her understanding and acceptance.     Plan Discussed with: CRNA and Surgeon  Anesthesia Plan Comments: (Will get AICD turned off preop due to prone position and difficulty keeping magnet in position)        Anesthesia Quick Evaluation

## 2013-10-22 NOTE — OR Nursing (Signed)
Upon insertion of foley catheter, size 16, it felt like the urethra was small, but it went in smoothly. No blood noted after insertion.

## 2013-10-22 NOTE — Op Note (Signed)
10/22/2013  3:16 PM  PATIENT:  Peter [name]  63 y.o. male with severe lumbar spondylolisthesis L4/5, and neurogenic claudication. He is admitted for lumbar decompression and fusion.   PRE-OPERATIVE DIAGNOSIS:  spondylolisthesis lumbar stenosis L4/5, L4/5 lateral recess stenosis  POST-OPERATIVE DIAGNOSIS:  spondylolisthesis lumbar stenosis L4/5, L4/5 lateral recess stenosis  PROCEDURE:  Procedure(s): Lumbar Four-Five decompression with posterior lumbar interbody fusion with interbody Peek  prostheses 32mm morselized autograft Lumbar decompression beyond what is necessary for Plif in order to decompress the L4 roots in the lateral recesses bilaterally  posterior lateral arthrodesis L4/5  posterior nonsegmental instrumentation Nuvasive instrumentation, Pedicle screws  SURGEON:  Surgeon(s): Winfield Cunas, MD Ophelia Charter, MD  ASSISTANTS:Jenkins, Dellis Filbert  ANESTHESIA:   general  EBL:  Total I/O In: 2500 [I.V.:2000; IV Piggyback:500] Out: 950 [Urine:550; Blood:400]  BLOOD ADMINISTERED:none  CELL SAVER GIVEN:none  COUNT:per nursing  DRAINS: none   SPECIMEN:  No Specimen  DICTATION: [name] is a 63 y.o. male whom was taken to the operating room intubated, and placed under a general anesthetic without difficulty. A foley catheter was placed under sterile conditions. He was positioned prone on a Jackson stable with all pressure points properly padded.  His lumbar region was prepped and draped in a sterile manner. I infiltrated 20cc's 1/2%lidocaine/1:2000,000 strength epinephrine into the planned incision. I opened the skin with a 10 blade and took the incision down to the thoracolumbar fascia. I exposed the lamina of L3,4,and 5 in a subperiosteal fashion bilaterally. I confirmed my location with an intraoperative xray.  I placed self retaining retractors and started the decompression.  I decompressed the spinal canal at L4/5. I performed a complete laminectomy of L4  and inferior facetectomies to decompress the L4 roots bilaterally in the lateral recesses. . The L5 roots were also deocmpressed bilaterally. discetomies were performed to complete the PLIF. The disc space was entered and very degenerated disc was removed with various tools. I prepared the innerspace for the Peek cages. I used 72mm cages packed with morselized autograft (local).   Pedicle screws were placed with an Oarm. Screws were placed at L4 and L5. They were placed in a medial to lateral direction. I navigated the screw placement using the Oarm and confirmed with fluoroscopy. I used 71mm x5.0screws at all 4 sites.   The posterolateral arthrodesis was completed by decorticating the lateral margins of bone at L4/5 then placing local autograft over the decorticated bone. Dr. Arnoldo Morale assisted with the posterolateral arthrodesis. Rods were placed in the screw heads and secured with locking caps. Final fluoroscopy showed the hardware to be in good position.  We closed the wound in layers approximating the thoracolumbar fascia, subcutaneous, and subcuticular planes with vicryl sutures. Dermabond was applied for a sterile dressing.    PLAN OF CARE: Admit to inpatient   PATIENT DISPOSITION:  PACU - hemodynamically stable.   Delay start of Pharmacological VTE agent (>24hrs) due to surgical blood loss or risk of bleeding:  yes

## 2013-10-22 NOTE — Anesthesia Postprocedure Evaluation (Signed)
  Anesthesia Post-op Note  Patient: Peter [name]  Procedure(s) Performed: Procedure(s) with comments: Lumbar Four-Five decompression with posterior lumbar interbody fusion with interbody prosthesis posterior lateral arthrodesis and posterior nonsegmental instrumentation (N/A) - Lumbar Four-Five decompression with posterior lumbar interbody fusion with interbody prosthesis posterior lateral arthrodesis and posterior nonsegmental instrumentation  Patient Location: PACU  Anesthesia Type:General  Level of Consciousness: awake  Airway and Oxygen Therapy: Patient Spontanous Breathing  Post-op Pain: mild  Post-op Assessment: Post-op Vital signs reviewed, Patient's Cardiovascular Status Stable, Respiratory Function Stable, Patent Airway, No signs of Nausea or vomiting and Pain level controlled  Post-op Vital Signs: Reviewed and stable  Complications: No apparent anesthesia complications

## 2013-10-22 NOTE — Preoperative (Signed)
Beta Blockers   Reason not to administer Beta Blockers:Not Applicable 

## 2013-10-22 NOTE — H&P (Signed)
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  He states that his problems started some time in April of this year.  He noticed that he had greater difficulty with his balance and gait and greater difficulty using his hands due to poor coordination.  While this was going on, he developed acute congestive heart failure and was admitted to [facility] on 03/10/2013 and was eventually diagnosed with a viral cardiomyopathy.  He also has been dealing with hypothyroidism secondary to Graves' disease and thyroidectomy since 2012.  It has been quite problematic getting his thyroid level to normal and he has had his medications adjusted on multiple occasions.  He states he was also given a diagnosis, so he does not know exactly when or why of chronic inflammatory demyelinating polyneuropathy.  He has had an EMG which was performed by Dr. Krista Blue along with the MRIs.  As a result of findings on his initial examination, he had his entire neuraxis evaluated and what he does have is increased cord signal at C6-7 with edema up and down into cord at that level.  Though that is not where his greatest amount of stenosis is.  He has difficulty with his hands, can barely button his shirt.  His balance is off.  His coordination is off, but he has continued to work as best as he could.  He does have pain, but that is not what his biggest complaint is.  He was quite fatigued earlier this year prior to him finding out that he had the viral cardiomyopathy.   He has weakness on his right side arm and leg, pain in his right arm and right leg, tingling in the right arm and hand and bottom of his feet.   REVIEW OF SYSTEMS:                                    Positive for eye glasses, hypertension, swelling in his feet, leg pain with walking, asthma, arm weakness, leg  weakness, back pain, arm pain, leg pain, joint pain, skin disease, problems with coordination in the arms and legs, thyroid disease, and inhalent allergies.   On his pain chart, he only lists problems on his right forearm and thigh.  He states that the pain is much worse and he states he is almost chronically immobile.   PAST MEDICAL HISTORY:            Current Medical Conditions:  Also includes hypertension and asthma.            Medications and Allergies:  HE IS ALLERGIC TO KEFLEX.  Medications include acetaminophen, prednisone, albuterol, aspirin, carvedilol, digoxin, Lasix, glucosamine, isosorbide, hydralazine, levothyroxine, and multivitamin, Olmesartan, Omega-3, vitamin B12, and tramadol.   FAMILY HISTORY:                                            Mother and father both deceased.   SOCIAL HISTORY:  He does not smoke,  He does not use alcohol.  He does not use illicit drugs.   PHYSICAL EXAMINATION:                                He is 74 inches in height.  He weighs 235 pounds and this is self reported.  On examination, he is alert and oriented x4 and answers all questions appropriately.  Memory, language, attention span and fund of knowledge are normal.  He is well kempt and in obvious distress.  He has markedly myelopathic gait and is spastic.  He is hyperreflexic at the knees and ankles.  1 to 2+ reflexes in the upper extremities.  He has suprapatellar jerk also.  Bilateral Hoffmann sign.  No clonus.  Proprioception intact in the upper and lower extremities though he mentions that the left great toe might be a little worse than the others.  Pupils are equal, round and reactive to light.  Full extraocular movements.  Full visual fields.  Hearing intact to finger rub.  Uvula elevates in midline.  Shoulder shrug is normal.  Tongue protrudes in the midline.   IMAGING STUDIES:                                          MRI of the cervical spine, thoracic  spine, lumbar spine were reviewed.  Lumbar spine MRI shows significant and quite severe spinal stenosis at L4-5 where he has facet arthropathy and ligamentous hypertrophy to conus and cauda, otherwise normal.  Thoracic spine is normal, but it does show the inferior portion of the cervical spine which shows cord edema and increased signal at C6-7 level confirmed by the C-spine MRI which shows stenosis and great deal of spondylitic change at C4-5, C5-6, C6-7, and some also at C7-T1.  Paraspinous soft tissue is otherwise normal.  He does not have herniated disc at these levels, but does have spondylitic ridging and very severe biforaminal narrowing, but his biggest problem is the canal stenosis.   DIAGNOSIS:                                                     Cervical spondylosis with myelopathy, cervical stenosis, and lumbar stenosis.  [name] will be admitted today for a lumbar fusion. Risks including bleeding, infection, fusion failure, hardware failure, damage to bowel/bladder function, weakness in lower extremities, need for further surgery. He has done well since the cervical surgery, and would now like to proceed with the lumbar decompression.

## 2013-10-22 NOTE — Transfer of Care (Signed)
Immediate Anesthesia Transfer of Care Note  Patient: Peter [name]  Procedure(s) Performed: Procedure(s) with comments: Lumbar Four-Five decompression with posterior lumbar interbody fusion with interbody prosthesis posterior lateral arthrodesis and posterior nonsegmental instrumentation (N/A) - Lumbar Four-Five decompression with posterior lumbar interbody fusion with interbody prosthesis posterior lateral arthrodesis and posterior nonsegmental instrumentation  Patient Location: PACU  Anesthesia Type:General  Level of Consciousness: awake, alert  and oriented  Airway & Oxygen Therapy: Patient Spontanous Breathing and Patient connected to face mask oxygen  Post-op Assessment: Report given to PACU RN  Post vital signs: Reviewed and stable  Complications: No apparent anesthesia complications

## 2013-10-22 NOTE — Anesthesia Procedure Notes (Signed)
Procedure Name: Intubation Performed by: Ruthanna Macchia Pre-anesthesia Checklist: Patient identified, Emergency Drugs available, Suction available, Patient being monitored and Timeout performed Patient Re-evaluated:Patient Re-evaluated prior to inductionOxygen Delivery Method: Circle system utilized Preoxygenation: Pre-oxygenation with 100% oxygen Intubation Type: IV induction Ventilation: Mask ventilation without difficulty Laryngoscope Size: Mac and 4 Grade View: Grade III Tube type: Oral Tube size: 7.5 mm Number of attempts: 1 Airway Equipment and Method: Stylet Placement Confirmation: ETT inserted through vocal cords under direct vision,  breath sounds checked- equal and bilateral,  CO2 detector and positive ETCO2 Secured at: 23 cm Tube secured with: Tape Dental Injury: Teeth and Oropharynx as per pre-operative assessment  Comments: Epiglottis seen with MAC 4, anterior.  Easy Mask.

## 2013-10-22 NOTE — Progress Notes (Signed)
ANTIBIOTIC CONSULT NOTE - INITIAL  Pharmacy Consult for Vancomycin Indication: Surgical Prophylaxis  Allergies  Allergen Reactions  . Keflex [Cephalexin] Anaphylaxis    Patient Measurements: Height: 6\' 2"  (188 cm) Weight: 225 lb 8 oz (102.286 kg) IBW/kg (Calculated) : 82.2   Vital Signs: Temp: 97.5 F (36.4 C) (01/28 1938) Temp src: Oral (01/28 1938) BP: 150/74 mmHg (01/28 1938) Pulse Rate: 70 (01/28 1938) Intake/Output from previous day:   Intake/Output from this shift: Total I/O In: -  Out: 175 [Urine:175]  Labs: No results found for this basename: WBC, HGB, PLT, LABCREA, CREATININE,  in the last 72 hours Estimated Creatinine Clearance: 119.2 ml/min (by C-G formula based on Cr of 0.82). No results found for this basename: VANCOTROUGH, Corlis Leak, VANCORANDOM, GENTTROUGH, GENTPEAK, GENTRANDOM, TOBRATROUGH, TOBRAPEAK, TOBRARND, AMIKACINPEAK, AMIKACINTROU, AMIKACIN,  in the last 72 hours   Microbiology: Recent Results (from the past 720 hour(s))  SURGICAL PCR SCREEN     Status: None   Collection Time    10/17/13 12:59 PM      Result Value Range Status   MRSA, PCR NEGATIVE  NEGATIVE Final   Staphylococcus aureus NEGATIVE  NEGATIVE Final   Comment:            The Xpert SA Assay (FDA     approved for NASAL specimens     in patients over 62 years of age),     is one component of     a comprehensive surveillance     program.  Test performance has     been validated by [employer] for patients greater     than or equal to 62 year old.     It is not intended     to diagnose infection nor to     guide or monitor treatment.    Medical History: Past Medical History  Diagnosis Date  . Allergy   . Asthma   . Hypertension   . Substance abuse   . Acute combined systolic and diastolic heart failure 05/23/9370  . Shortness of breath     with asthma  . Arthritis   . Cardiomyopathy   . Automatic implantable cardioverter-defibrillator in situ     portable  defibrilator  . Complication of anesthesia     problems urinating after anesthesia  . Hypothyroidism    Assessment: 63 YOM s/p spinal surgery, pharmacy is consulted to dose vancomycin for post-op prophylaxis since pt. is allergic to keflex. Scr 0.82 on 1/23, est. crcl > 100 ml/min. He received vancomycin 1500 mg before surgery at ~ 1000. No drain, confirmed with RN  Goal of Therapy:  Prevent surgical infections.  Plan:  Vancomycin 1g IV x 1 at 1250 mg at 2200 Pharmacy sign off, please re-consult if other antibiotics are needed.   Maryanna Shape, PharmD, BCPS  Clinical Pharmacist  Pager: [phone]   10/22/2013,8:50 PM

## 2013-10-23 ENCOUNTER — Encounter (HOSPITAL_COMMUNITY): Payer: Self-pay

## 2013-10-23 NOTE — Progress Notes (Signed)
Patient ID: Peter [name], male   DOB: [birth year], 63 y.o.   MRN: [id number] BP 111/61  Pulse 111  Temp(Src) 99.2 F (37.3 C) (Oral)  Resp 18  Ht 6\' 2"  (1.88 m)  Wt 102.286 kg (225 lb 8 oz)  BMI 28.94 kg/m2  SpO2 99% Alert and oriented x 4 Moving all extremities well Wound is clean and dry Working with pt,ot

## 2013-10-23 NOTE — Evaluation (Signed)
Physical Therapy Evaluation Patient Details Name: Peter [name] MRN: [id number] DOB: 27-Jan-1951 Today's Date: 10/23/2013 Time: 2458-0998 PT Time Calculation (min): 36 min  PT Assessment / Plan / Recommendation History of Present Illness  pt rpesents with L4-5 PLIF and recent Cervical surgery.    Clinical Impression  Pt very painful this am, but willing to attempt mobility.  RN arrived at beginning of session to give pain meds.  Pt well known to this PT and anticipate he will progress well enough to return to home with wife.    PT Assessment  Patient needs continued PT services    Follow Up Recommendations  Home health PT;Supervision/Assistance - 24 hour    Does the patient have the potential to tolerate intense rehabilitation      Barriers to Discharge        Equipment Recommendations  3in1 (PT)    Recommendations for Other Services     Frequency Min 5X/week    Precautions / Restrictions Precautions Precautions: Fall;Back Precaution Booklet Issued: No Precaution Comments: pt indicates he has a back precaution paper from previous surgery and did not need another.   Restrictions Weight Bearing Restrictions: No   Pertinent Vitals/Pain 8/10 in back.  RN arrived at beginning of session to medicate.        Mobility  Bed Mobility Overal bed mobility: Needs Assistance Bed Mobility: Rolling;Sidelying to Sit Rolling: Min assist Sidelying to sit: Mod assist General bed mobility comments: cues for log roll and sequencing.   Transfers Overall transfer level: Needs assistance Equipment used: Rolling walker (2 wheeled) Transfers: Sit to/from Stand Sit to Stand: Mod assist General transfer comment: cues for UE use and controlling descent to sitting.  pt tries to grab RW to come to stand.   Ambulation/Gait Ambulation/Gait assistance: Min assist Ambulation Distance (Feet): 150 Feet Assistive device: Rolling walker (2 wheeled) Gait Pattern/deviations: Step-through  pattern;Decreased stride length;Trunk flexed;Decreased dorsiflexion - right;Decreased dorsiflexion - left General Gait Details: pt with foot drop at baseline for quite sometime per pt.  cues to attempt upright posture as pt remains quite flexed and leans heavily on RW.      Exercises     PT Diagnosis: Difficulty walking;Acute pain  PT Problem List: Decreased strength;Decreased activity tolerance;Decreased balance;Decreased mobility;Decreased coordination;Decreased knowledge of use of DME;Decreased knowledge of precautions;Impaired sensation;Pain PT Treatment Interventions: DME instruction;Gait training;Stair training;Functional mobility training;Therapeutic activities;Therapeutic exercise;Balance training;Neuromuscular re-education;Patient/family education     PT Goals(Current goals can be found in the care plan section) Acute Rehab PT Goals Patient Stated Goal: Get more return in my strength.   PT Goal Formulation: With patient Time For Goal Achievement: 11/06/13 Potential to Achieve Goals: Good  Visit Information  Last PT Received On: 10/23/13 Assistance Needed: +1 History of Present Illness: pt rpesents with L4-5 PLIF and recent Cervical surgery.         Prior [place] expects to be discharged to:: Private residence Living Arrangements: Spouse/significant other Available Help at Discharge: Family;Available 24 hours/day Type of Home: House Home Access: Stairs to enter [employer] of Steps: 1 Entrance Stairs-Rails: None Home Layout: One level Home Equipment: Walker - 2 wheels Prior Function Level of Independence: Needs assistance Gait / Transfers Assistance Needed: RW and occasional A to come to stand.   ADL's / Homemaking Assistance Needed: Wife A with hard to reach for bathing and dressing.   Communication Communication: No difficulties Dominant Hand: Right    Cognition  Cognition Arousal/Alertness: Awake/alert Behavior During  Therapy: WFL for  tasks assessed/performed Overall Cognitive Status: Within Functional Limits for tasks assessed    Extremity/Trunk Assessment Upper Extremity Assessment Upper Extremity Assessment: Defer to OT evaluation Lower Extremity Assessment Lower Extremity Assessment: RLE deficits/detail;LLE deficits/detail RLE Deficits / Details: Grossly weak 4-/5 at hip/knee and ankle 3-/5 RLE Sensation: decreased light touch RLE Coordination: decreased fine motor LLE Deficits / Details: Grossly weak 4-/5 at hip/knee and 3-/5 at ankle.   LLE Sensation: decreased light touch LLE Coordination: decreased fine motor Cervical / Trunk Assessment Cervical / Trunk Assessment: Kyphotic   Balance Balance Overall balance assessment: Needs assistance Standing balance support: Bilateral upper extremity supported Standing balance-Leahy Scale: Fair  End of Session PT - End of Session Equipment Utilized During Treatment: Gait belt Activity Tolerance: Patient limited by pain Patient left: in chair;with call bell/phone within reach;with family/visitor present Nurse Communication: Mobility status;Patient requests pain meds  GP     Catarina Hartshorn, [place] 10/23/2013, 9:42 AM

## 2013-10-23 NOTE — Plan of Care (Signed)
Problem: Consults Goal: Diagnosis - Spinal Surgery Outcome: Completed/Met Date Met:  10/23/13 Thoraco/Lumbar Spine Fusion

## 2013-10-23 NOTE — Progress Notes (Signed)
UR complete.  Jacolyn Joaquin RN, MSN 

## 2013-10-23 NOTE — Evaluation (Signed)
Occupational Therapy Evaluation Patient Details Name: Peter [name] MRN: [id number] DOB: 13-Jun-1951 Today's Date: 10/23/2013 Time: 2641-5830 OT Time Calculation (min): 29 min  OT Assessment / Plan / Recommendation History of present illness Pt rpesents with L4-5 PLIF and recent Cervical surgery.     Clinical Impression   Pt demonstrates decreased ability to perform ADL's and self care tasks s/p L4-5 PLIF. He will benefit from acute OT to assist in maximizing independence w/ ADL's and functional transfers as well as pt/family education. Note: Pt w/ h/o decreased FM/coordination bilaterally (has had out-pt OT previously).    OT Assessment  Patient needs continued OT Services    Follow Up Recommendations  No OT follow up;Supervision - Intermittent    Barriers to Discharge      Equipment Recommendations  None recommended by OT    Recommendations for Other Services    Frequency  Min 2X/week    Precautions / Restrictions Precautions Precautions: Fall;Back Precaution Booklet Issued: Yes (comment) Precaution Comments: Handout issued and reviewed w/ pt & wife today Restrictions Weight Bearing Restrictions: No   Pertinent Vitals/Pain 10/10 pain low back pain/surgical pain, per pt reports. Pt had been premedicated approximately 1 hr prior to session and was noted to joke w/ staff and request functional mobility/therapeutic activity in room & hallway today after assessment was completed and pain reported.    ADL  Eating/Feeding: Performed;Modified independent Where Assessed - Eating/Feeding: Chair Grooming: Performed;Wash/dry hands;Modified independent Where Assessed - Grooming: Supported sitting Upper Body Bathing: Simulated;Set up;Supervision/safety Where Assessed - Upper Body Bathing: Supported sitting Lower Body Bathing: Simulated;Minimal assistance Where Assessed - Lower Body Bathing: Supported sit to stand Upper Body Dressing: Simulated;Set up;Supervision/safety Where  Assessed - Upper Body Dressing: Supported sit to stand Lower Body Dressing: Performed;Minimal assistance Where Assessed - Lower Body Dressing: Supported sit to stand Toilet Transfer: Simulated;Min guard (Pt transferred from bed into hall, back in room, declined toileting but transferred to chair) Toilet Transfer Method: Sit to stand Toilet Transfer Equipment: Raised toilet seat with arms (or 3-in-1 over toilet) Toileting - Clothing Manipulation and Hygiene: Simulated;Supervision/safety Where Assessed - Toileting Clothing Manipulation and Hygiene: Sit to stand from 3-in-1 or toilet Tub/Shower Transfer Method: Not assessed Equipment Used: Gait belt;Rolling walker Transfers/Ambulation Related to ADLs: Pt overall min A sit to stand and Min guard for functional moblity and transfers using RW ADL Comments: Pt and his spouse were educated in role of OT. He participated in ADL and therapeutic activity session today. Educated in A/E, back precautions and ADL techniques, transfers, functional mobility and DME. Pt plans to d/c home w/ his wife PRN assist.    OT Diagnosis: Generalized weakness;Acute pain  OT Problem List: Decreased coordination;Decreased strength;Decreased activity tolerance;Impaired balance (sitting and/or standing);Decreased knowledge of precautions;Decreased knowledge of use of DME or AE;Pain OT Treatment Interventions: Self-care/ADL training;DME and/or AE instruction;Patient/family education;Energy conservation;Therapeutic activities;Balance training   OT Goals(Current goals can be found in the care plan section) Acute Rehab OT Goals Patient Stated Goal: Increased strength and coordination Time For Goal Achievement: 11/06/13 Potential to Achieve Goals: Good  Visit Information  Last OT Received On: 10/23/13 Assistance Needed: +1 History of Present Illness: Pt rpesents with L4-5 PLIF and recent Cervical surgery.         Prior [place] expects  to be discharged to:: Private residence Living Arrangements: Spouse/significant other Available Help at Discharge: Family;Available 24 hours/day Type of Home: House Home Access: Stairs to enter [employer] of Steps: 1 Entrance  Stairs-Rails: None Home Layout: One level Home Equipment: Walker - 2 wheels;Shower seat;Bedside commode;Grab bars - tub/shower Prior Function Level of Independence: Needs assistance Gait / Transfers Assistance Needed: RW and occasional A to come to stand.   ADL's / Homemaking Assistance Needed: Wife A with hard to reach for bathing and dressing.   Comments: uses walker Communication Communication: No difficulties Dominant Hand: Right    Vision/Perception Vision - History Baseline Vision: Wears glasses all the time Patient Visual Report: No change from baseline   Cognition  Cognition Arousal/Alertness: Awake/alert Behavior During Therapy: WFL for tasks assessed/performed Overall Cognitive Status: Within Functional Limits for tasks assessed    Extremity/Trunk Assessment Upper Extremity Assessment Upper Extremity Assessment: Generalized weakness;RUE deficits/detail;LUE deficits/detail (Pt w/ h/o bilateral UE weakness and impaired FM/coordination. Has had out-pt therapy in the past.) RUE Deficits / Details: Grossly weak 3+/5 RUE Coordination: decreased fine motor LUE Deficits / Details: Grossly weak 3+/5 LUE Coordination: decreased fine motor Lower Extremity Assessment Lower Extremity Assessment: Defer to PT evaluation Cervical / Trunk Assessment Cervical / Trunk Assessment: Kyphotic    Mobility Bed Mobility Overal bed mobility: Needs Assistance Bed Mobility: Rolling;Sidelying to Sit Rolling: Min assist Sidelying to sit: Mod assist General bed mobility comments: Cues for log roll, sequencing & maintaining back precautions. Transfers Overall transfer level: Needs assistance Equipment used: Rolling walker (2 wheeled) Transfers: Sit to/from  Stand Sit to Stand: Min assist General transfer comment: Cues for UE use and controlling descent to sitting.  pt tries to grab RW to come to stand.          Balance Balance Overall balance assessment: Needs assistance Standing balance support: Bilateral upper extremity supported Standing balance-Leahy Scale: Fair   End of Session OT - End of Session Equipment Utilized During Treatment: Gait belt;Rolling walker Activity Tolerance: Patient tolerated treatment well Patient left: in chair;with call bell/phone within reach;with family/visitor present Nurse Communication: Mobility status  GO     Josephine Igo Dixon 10/23/2013, 1:09 PM

## 2013-10-24 ENCOUNTER — Encounter: Payer: 59 | Admitting: Occupational Therapy

## 2013-10-24 ENCOUNTER — Ambulatory Visit: Payer: 59 | Admitting: Physical Therapy

## 2013-10-24 NOTE — Progress Notes (Signed)
Occupational Therapy Treatment Patient Details Name: Peter [name] MRN: [id number] DOB: 01/22/1951 Today's Date: 10/24/2013 Time: 5852-7782 OT Time Calculation (min): 31 min  OT Assessment / Plan / Recommendation  History of present illness Pt rpesents with L4-5 PLIF and recent Cervical surgery.     OT comments  Pt. Awake, hesitant for participation secondary to reports of 10/10 pain.  Min/mod a for all aspects of bed mobility.  Able to initiate pivotal steps to recliner.  Requires assistance for all LB adls but states wife available to assist.  Limited session tolerance secondary to pain.  Follow Up Recommendations  No OT follow up;Supervision - Intermittent           Equipment Recommendations  None recommended by OT        Frequency Min 2X/week   Progress towards OT Goals Progress towards OT goals: Progressing toward goals  Plan Discharge plan remains appropriate    Precautions / Restrictions Precautions Precautions: Fall;Back   Pertinent Vitals/Pain 10/10, repositioned    ADL  Lower Body Dressing: Performed;Maximal assistance Where Assessed - Lower Body Dressing: Supported sitting Toilet Transfer: Performed;Minimal assistance Toilet Transfer Method: Sit to stand;Stand pivot Toileting - Clothing Manipulation and Hygiene: Simulated;Min guard Tub/Shower Transfer: Simulated;Maximal assistance Transfers/Ambulation Related to ADLs: min a for sit/stand with inst. and tactile cues for pushing through b ues into walker to aide with back and leg pain/weakness ADL Comments: attempted crossing l/r leg over knee. pt. states not able and does not need a/e secondary to his wife assisted prior to sx and will assist after with lb tasks.  attempted sim. tub transfer. pt. reports tub with shower chair and grab bar.  at this time not able to bring rle up to clear tub ledge but able to bring lle.  agrees on sponge bath until strength and safety increase     OT Goals(current goals can now  be found in the care plan section)    Visit Information  Last OT Received On: 10/24/13 History of Present Illness: Pt rpesents with L4-5 PLIF and recent Cervical surgery.                   Cognition  Cognition Arousal/Alertness: Awake/alert Behavior During Therapy: WFL for tasks assessed/performed Overall Cognitive Status: Within Functional Limits for tasks assessed    Mobility  Bed Mobility Overal bed mobility: Needs Assistance General bed mobility comments: cues for log roll. hob flat but pt. still used bed rail to pull into roll, also min a to complete roll with trunk assist.  able to bring b les off of bed but min/mod a to bring trunk upright.  cues to scoot eob and required increased time for completion Transfers Overall transfer level: Needs assistance Equipment used: Rolling walker (2 wheeled) Transfers: Sit to/from [facility] Sit to Stand: Min assist General transfer comment: cues for ue support and for contolling descent, difficult for pt. because he is tall to lower surfaces              End of Session OT - End of Session Equipment Utilized During Treatment: Rolling walker Activity Tolerance: Patient tolerated treatment well Patient left: in chair;with call bell/phone within reach       Janice Coffin, COTA/L 10/24/2013, 9:29 AM

## 2013-10-24 NOTE — Progress Notes (Signed)
Physical Therapy Treatment Patient Details Name: Jawuan Robb MRN: [id number] DOB: Jan 31, 1951 Today's Date: 10/24/2013 Time: 1696-7893 PT Time Calculation (min): 28 min  PT Assessment / Plan / Recommendation  History of Present Illness Pt rpesents with L4-5 PLIF and recent Cervical surgery.     PT Comments   Pt continues to improve mobility, but still with heavy reliance on RW.  Pt would benefit from practicing step again.  Will continue to follow.    Follow Up Recommendations  Home health PT;Supervision/Assistance - 24 hour     Does the patient have the potential to tolerate intense rehabilitation     Barriers to Discharge        Equipment Recommendations  3in1 (PT)    Recommendations for Other Services    Frequency Min 5X/week   Progress towards PT Goals Progress towards PT goals: Progressing toward goals  Plan Current plan remains appropriate    Precautions / Restrictions Precautions Precautions: Fall;Back Precaution Comments: pt verbalized 3/3 back precautions.   Restrictions Weight Bearing Restrictions: No   Pertinent Vitals/Pain "No pain, no gain."    Mobility  Transfers Overall transfer level: Needs assistance Equipment used: Rolling walker (2 wheeled) Transfers: Sit to/from Stand Sit to Stand: Min assist General transfer comment: Demos good use of UEs and attempts to control descent to sitting.   Ambulation/Gait Ambulation/Gait assistance: Min guard Ambulation Distance (Feet): 500 Feet Assistive device: Rolling walker (2 wheeled) Gait Pattern/deviations: Step-through pattern;Decreased stride length;Trunk flexed General Gait Details: Cues for upright posture and decreased WBing on UEs.   Stairs: Yes Stairs assistance: Min assist Stair Management: No rails;Step to pattern;Backwards;With walker Number of Stairs: 1 General stair comments: cues for safe technique and to slow down.  pt needs balance A when moving RW and A to position RW.  Wife present for  education.      Exercises     PT Diagnosis:    PT Problem List:   PT Treatment Interventions:     PT Goals (current goals can now be found in the care plan section) Acute Rehab PT Goals Time For Goal Achievement: 11/06/13 Potential to Achieve Goals: Good  Visit Information  Last PT Received On: 10/24/13 Assistance Needed: +1 History of Present Illness: Pt rpesents with L4-5 PLIF and recent Cervical surgery.      Subjective Data      Cognition  Cognition Arousal/Alertness: Awake/alert Behavior During Therapy: WFL for tasks assessed/performed Overall Cognitive Status: Within Functional Limits for tasks assessed    Balance     End of Session PT - End of Session Equipment Utilized During Treatment: Gait belt Activity Tolerance: Patient tolerated treatment well Patient left: in bed;with call bell/phone within reach;with family/visitor present (sitting EOB) Nurse Communication: Mobility status (Dressing needs changed.  )   GP     [place], Thornton Papas, [place] 10/24/2013, 2:47 PM

## 2013-10-24 NOTE — Progress Notes (Signed)
Patient ID: Peter [name], male   DOB: 1951/04/06, 63 y.o.   MRN: [id number] BP 100/54  Pulse 95  Temp(Src) 99.4 F (37.4 C) (Oral)  Resp 20  Ht 6\' 2"  (1.88 m)  Wt 102.286 kg (225 lb 8 oz)  BMI 28.94 kg/m2  SpO2 97% Alert and oriented x 4 Moving lower extremities Wound dressing is dry Working with pt/ ot Will be here through the weekend

## 2013-10-25 NOTE — Progress Notes (Signed)
OT Cancellation Note  Patient Details Name: Kayon Dozier MRN: [id number] DOB: 25-Oct-1950   Cancelled Treatment:    Reason Eval/Treat Not Completed: Pain limiting ability to participate                                           Pt. Declined secondary to c/o pain. Says "i just need a day off please"  Janice Coffin, COTA/L 10/25/2013, 12:19 PM

## 2013-10-25 NOTE — Progress Notes (Signed)
Patient ID: Peter [name], male   DOB: 27-Mar-1951, 63 y.o.   MRN: [id number] Patient has a little more pain today. Pain with movement. Some mild aching in the legs but most of the pain is in the back. Seems to have good strength. Continue current management and therapy.

## 2013-10-25 NOTE — Progress Notes (Signed)
PT Cancellation Note  Patient Details Name: Peter [name] MRN: [id number] DOB: Jul 19, 1951   Cancelled Treatment:    Reason Eval/Treat Not Completed: Pain limiting ability to participate . Patient requesting to hold PT today as he did not sleep well and doesn't feel well today. Patient normally highly motivated and he did walk 539ft with therapist yesterday. Informed RN that patient wasn't feeling well and he requested to hold. Will follow up on Monday   [name] 10/25/2013, 12:10 PM

## 2013-10-25 NOTE — Progress Notes (Signed)
Pt refused to be oob to chair or even work with Pt stating I am not feeling well today and just want to stay in bed . Medicated for pain . Pt had low grade fever this pm and I/s use encouraged.  refused breakfast and lunch, just on po fluids. Rn will continue to monitor.

## 2013-10-26 NOTE — Progress Notes (Signed)
Physical Therapy Treatment Patient Details Name: Peter [name] MRN: [id number] DOB: Apr 16, 1951 Today's Date: 10/26/2013 Time: 2094-7096 PT Time Calculation (min): 26 min  PT Assessment / Plan / Recommendation  History of Present Illness Pt rpesents with L4-5 PLIF and recent Cervical surgery.     PT Comments   Progressing with ambulation, increased distance, less assist required today. Will continue to progress.  Follow Up Recommendations  Home health PT;Supervision/Assistance - 24 hour     Does the patient have the potential to tolerate intense rehabilitation     Barriers to Discharge        Equipment Recommendations  3in1 (PT)    Recommendations for Other Services    Frequency Min 5X/week   Progress towards PT Goals Progress towards PT goals: Progressing toward goals  Plan Current plan remains appropriate    Precautions / Restrictions Precautions Precautions: Fall;Back Precaution Comments: pt verbalized 3/3 back precautions.   Restrictions Weight Bearing Restrictions: No   Pertinent Vitals/Pain 5/10    Mobility  Bed Mobility Overal bed mobility: Needs Assistance Bed Mobility: Rolling;Sidelying to Sit Rolling: Supervision Sidelying to sit: Min guard General bed mobility comments: Increased time to perform, VCs for sequencing, patient asked for assist but was able to perform activities without any physical assist Transfers Overall transfer level: Needs assistance Equipment used: Rolling walker (2 wheeled) Transfers: Sit to/from Stand Sit to Stand: Min assist General transfer comment: VCs for hand placement, patient with good body positioning, min assist for stability upon standing, min assist to control descent to chair Ambulation/Gait Ambulation/Gait assistance: Supervision;Min guard Ambulation Distance (Feet): 680 Feet Assistive device: Rolling walker (2 wheeled) Gait Pattern/deviations: Step-through pattern;Decreased stride length;Trunk flexed;Decreased  dorsiflexion - right (foor drop evident on RLE) Gait velocity: decreased Gait velocity interpretation: Below normal speed for age/gender General Gait Details: Cues for upright posture and decreased WBing on UEs.   Stairs: Yes Stairs assistance: Min guard Stair Management: No rails;Step to pattern;Backwards;With walker Number of Stairs: 1 General stair comments: VCs for positioning, VCs for upright posture, min guard for stability, educated patient on sequencing     PT Goals (current goals can now be found in the care plan section) Acute Rehab PT Goals Patient Stated Goal: to go home tomorrow PT Goal Formulation: With patient Time For Goal Achievement: 11/06/13 Potential to Achieve Goals: Good  Visit Information  Last PT Received On: 10/26/13 Assistance Needed: +1 History of Present Illness: Pt rpesents with L4-5 PLIF and recent Cervical surgery.      Subjective Data  Subjective: I don't wanna cut things short Patient Stated Goal: to go home tomorrow   Cognition  Cognition Arousal/Alertness: Awake/alert Behavior During Therapy: WFL for tasks assessed/performed Overall Cognitive Status: Within Functional Limits for tasks assessed    Balance  Balance Standing balance-Leahy Scale: Fair  End of Session PT - End of Session Equipment Utilized During Treatment: Gait belt Activity Tolerance: Patient tolerated treatment well Patient left: in chair;with call bell/phone within reach;with chair alarm set Nurse Communication: Mobility status   GP     [name] 10/26/2013, 2:28 PM Alben Deeds, [place] DPT  [phone]

## 2013-10-26 NOTE — Progress Notes (Signed)
Patient ID: Peter [name], male   DOB: April 25, 1951, 63 y.o.   MRN: [id number] Patient doing well strength out of 5 wound clean and dry patient requesting consideration to go home tomorrow

## 2013-10-26 NOTE — Progress Notes (Signed)
Occupational Therapy Treatment Patient Details Name: Peter [name] MRN: [id number] DOB: April 10, 1951 Today's Date: 10/26/2013 Time: 9211-9417 OT Time Calculation (min): 31 min  OT Assessment / Plan / Recommendation  History of present illness Pt rpesents with L4-5 PLIF and recent Cervical surgery.     OT comments  Pt moving well during session. Education provided to pt. Pt states wife planning to assist with bathing/dressing.   Follow Up Recommendations  No OT follow up;Supervision - Intermittent (when OOB/mobility)    Barriers to Discharge       Equipment Recommendations  None recommended by OT    Recommendations for Other Services    Frequency Min 2X/week   Progress towards OT Goals Progress towards OT goals: Progressing toward goals  Plan Discharge plan remains appropriate    Precautions / Restrictions Precautions Precautions: Fall;Back Precaution Booklet Issued: No Precaution Comments: Reviewed back precautions with pt. Restrictions Weight Bearing Restrictions: No   Pertinent Vitals/Pain Pain 8/10. Nurse aware and brought pain meds.     ADL  Grooming: Teeth care;Min guard Where Assessed - Grooming: Supported standing Toilet Transfer: Minimal Print production planner Method: Sit to Loss adjuster, chartered: Other (comment) (from chair) Equipment Used: Gait belt;Rolling walker Transfers/Ambulation Related to ADLs: Min guard for ambulation-cues to stand up straight. Min A/Min guard for transfers. ADL Comments: Pt wanting to ambulate in hallway/around unit. OT provided education on toilet aid for hygiene if needed. Recommended spouse be with him 24/7. Discussed use of bag on walker and also discussed buttonhook as pt has difficulty with buttons but encouraged pt to be using right hand for activities to help with coordination. Educated on dressing technique and discussed use of button up shirts. Discussed use of two cups for teeth care and maintaining precautions  when reaching for items on sink. OT demonstrated alternative tub transfer technique.  Spoke about long handled sponge for bathing.    OT Diagnosis:    OT Problem List:   OT Treatment Interventions:     OT Goals(current goals can now be found in the care plan section) Acute Rehab OT Goals Patient Stated Goal: not stated Time For Goal Achievement: 11/06/13 Potential to Achieve Goals: Good ADL Goals Pt Will Perform Grooming: with supervision;standing Pt Will Perform Lower Body Dressing: with min guard assist;with adaptive equipment;sit to/from stand;with caregiver independent in assisting Pt Will Transfer to Toilet: with modified independence;ambulating;bedside commode Pt Will Perform Toileting - Clothing Manipulation and hygiene: with modified independence;sit to/from stand Pt Will Perform Tub/Shower Transfer: with min assist;with caregiver independent in assisting;ambulating;shower seat;rolling walker;Tub transfer Additional ADL Goal #1: Pt will be I stating and implementing 3/3 back precautions during ADL's  Visit Information  Last OT Received On: 10/26/13 Assistance Needed: +1 History of Present Illness: Pt rpesents with L4-5 PLIF and recent Cervical surgery.      Subjective Data      Prior Functioning       Cognition  Cognition Arousal/Alertness: Awake/alert Behavior During Therapy: WFL for tasks assessed/performed Overall Cognitive Status: Within Functional Limits for tasks assessed    Mobility  Bed Mobility Overal bed mobility: Needs Assistance Bed Mobility: Rolling;Sit to Sidelying Rolling: Min assist Sidelying to sit: Min guard Sit to sidelying: Min assist General bed mobility comments: Assist to keep legs in alignment with trunk. Assist to get legs onto bed. Transfers Overall transfer level: Needs assistance Equipment used: Rolling walker (2 wheeled) Transfers: Sit to/from Stand Sit to Stand: Min assist;Min guard General transfer comment: Min A for sit to stand  from chair and Min guard for stand to sit to bed.     Exercises         End of Session OT - End of Session Equipment Utilized During Treatment: Gait belt;Rolling walker Activity Tolerance: Patient tolerated treatment well Patient left: in bed;with call bell/phone within reach  [place], [place] L OTR/L [phone] 10/26/2013, 4:39 PM

## 2013-10-27 ENCOUNTER — Ambulatory Visit: Payer: 59 | Admitting: Physical Therapy

## 2013-10-27 ENCOUNTER — Encounter: Payer: 59 | Admitting: Occupational Therapy

## 2013-10-27 NOTE — Progress Notes (Signed)
Physical Therapy Treatment Patient Details Name: Peter [name] MRN: [id number] DOB: [birth year] Today's Date: 10/27/2013 Time: 1740-8144 PT Time Calculation (min): 30 min  PT Assessment / Plan / Recommendation  History of Present Illness Pt rpesents with L4-5 PLIF and recent Cervical surgery.     PT Comments   Pt expressing concern about outcomes of surgery and his prognosis.  Encouraged pt to have discussion with MD about his concerns.  Will continue to follow.    Follow Up Recommendations  Home health PT;Supervision/Assistance - 24 hour     Does the patient have the potential to tolerate intense rehabilitation     Barriers to Discharge        Equipment Recommendations  3in1 (PT)    Recommendations for Other Services    Frequency Min 5X/week   Progress towards PT Goals Progress towards PT goals: Progressing toward goals  Plan Current plan remains appropriate    Precautions / Restrictions Precautions Precautions: Fall;Back Precaution Comments: Reviewed back precautions with pt. Restrictions Weight Bearing Restrictions: No   Pertinent Vitals/Pain Indicates increased stiffness today.      Mobility  Bed Mobility Overal bed mobility: Needs Assistance Bed Mobility: Rolling;Sidelying to Sit Rolling: Min guard Sidelying to sit: Min guard General bed mobility comments: cues for log roll technique and encouragement to perform without physical A from PT.   Transfers Overall transfer level: Needs assistance Equipment used: Rolling walker (2 wheeled) Transfers: Sit to/from Stand Sit to Stand: Min assist General transfer comment: Increased A coming to stand today and unable to control descent to sitting.   Ambulation/Gait Ambulation/Gait assistance: Min guard Ambulation Distance (Feet): 180 Feet Assistive device: Rolling walker (2 wheeled) Gait Pattern/deviations: Step-through pattern;Decreased stride length;Trunk flexed;Decreased dorsiflexion - right;Decreased dorsiflexion  - left General Gait Details: pt indicates increased fatigue and pain today limiting ambulation.  cues for upright posture and positioning within RW.      Exercises     PT Diagnosis:    PT Problem List:   PT Treatment Interventions:     PT Goals (current goals can now be found in the care plan section) Acute Rehab PT Goals PT Goal Formulation: With patient Time For Goal Achievement: 11/06/13 Potential to Achieve Goals: Good  Visit Information  Last PT Received On: 10/27/13 Assistance Needed: +1 History of Present Illness: Pt rpesents with L4-5 PLIF and recent Cervical surgery.      Subjective Data      Cognition  Cognition Arousal/Alertness: Awake/alert Behavior During Therapy: WFL for tasks assessed/performed Overall Cognitive Status: Within Functional Limits for tasks assessed    Balance  Balance Overall balance assessment: Needs assistance Standing balance support: Bilateral upper extremity supported Standing balance-Leahy Scale: Fair  End of Session PT - End of Session Equipment Utilized During Treatment: Gait belt Activity Tolerance: Patient tolerated treatment well Patient left: in chair;with call bell/phone within reach Nurse Communication: Mobility status   GP     Catarina Hartshorn, [place] 10/27/2013, 3:44 PM

## 2013-10-27 NOTE — Progress Notes (Signed)
Patient ID: Peter [name], male   DOB: 11-Jun-1951, 63 y.o.   MRN: [id number] BP 135/77  Pulse 78  Temp(Src) 98.4 F (36.9 C) (Oral)  Resp 20  Ht 6\' 2"  (1.88 m)  Wt 102.286 kg (225 lb 8 oz)  BMI 28.94 kg/m2  SpO2 96% Alert and oriented x 4 Moving all extremities HHPT recommended. Expectations not in line with expected course, I explained this to Mr. Witczak. Though I want him to push, his idea that he should be doing much more at this point is unrealistic.  Wound dressing is dry. If drainage tomorrow I will suture wound . Should be ready for discharge quite soon

## 2013-10-27 NOTE — Progress Notes (Signed)
UR complete.  Sophi Calligan RN, MSN 

## 2013-10-28 MED ORDER — LIDOCAINE HCL (PF) 1 % IJ SOLN
INTRAMUSCULAR | Status: AC
  Administered 2013-10-28: 10:00:00
  Filled 2013-10-28: qty 5

## 2013-10-28 NOTE — Progress Notes (Signed)
Occupational Therapy Treatment Patient Details Name: Peter [name] MRN: [id number] DOB: [birth year] Today's Date: 10/28/2013 Time: 9518-8416 OT Time Calculation (min): 24 min  OT Assessment / Plan / Recommendation  History of present illness Pt rpesents with L4-5 PLIF and recent Cervical surgery.     OT comments  Pt. Required several attempts before he was able to participate in skilled ot today secondary to con't. C/o pain and lethargy.  Once up able to amb. To/from b.room with min guard/min a.  Moving slow with transitional movements supine/sit/standing.  Wife present and confirms she can assist with all LB ADLS. Both also state they will sponge bathe initially until pt. Able to safely maneuver into tub.    Follow Up Recommendations  No OT follow up;Supervision - Intermittent           Equipment Recommendations  None recommended by OT        Frequency Min 2X/week   Progress towards OT Goals Progress towards OT goals: Progressing toward goals  Plan Discharge plan remains appropriate    Precautions / Restrictions Precautions Precautions: Fall;Back Precaution Comments: Reviewed back precautions with pt. Restrictions Weight Bearing Restrictions: No   Pertinent Vitals/Pain No c/o secondary to pain meds given prior to arrival    ADL  Toilet Transfer: Performed Toilet Transfer Method: Sit to stand Toilet Transfer Equipment: Raised toilet seat with arms (or 3-in-1 over toilet);Grab bars Toileting - Clothing Manipulation and Hygiene: Performed;Min guard Where Assessed - Toileting Clothing Manipulation and Hygiene: Sit to stand from 3-in-1 or toilet Transfers/Ambulation Related to ADLs: Min guard for ambulation-cues to stand up straight. Min A/Min guard for transfers. ADL Comments: states he uses elevated surface in b.room at home so 3-n-1 placed over commode.  wife present and her and pt. discussed and agree sponge bathe initially until pt. able to safely step in/out of tub.  wife  also agrees that she is able to assist with LB adls     OT Goals(current goals can now be found in the care plan section)    Visit Information  Last OT Received On: 10/28/13 History of Present Illness: Pt rpesents with L4-5 PLIF and recent Cervical surgery.                   Cognition  Cognition Arousal/Alertness: Awake/alert Behavior During Therapy: WFL for tasks assessed/performed Overall Cognitive Status: Within Functional Limits for tasks assessed    Mobility  Bed Mobility General bed mobility comments: A with bringing trunk up to sitting and bring LEs back into bed.   Transfers Overall transfer level: Needs assistance Equipment used: Rolling walker (2 wheeled) Sit to Stand: Min assist General transfer comment: Requiring increased A to come to stand today.  pt indicates feeling weaker and with more pain.                End of Session OT - End of Session Equipment Utilized During Treatment: Rolling walker Activity Tolerance: Patient tolerated treatment well Patient left: in bed;with call bell/phone within reach;with family/visitor present       Janice Coffin, COTA/L 10/28/2013, 3:12 PM

## 2013-10-28 NOTE — Progress Notes (Signed)
Patient ID: Peter [name], male   DOB: December 14, 1950, 63 y.o.   MRN: [id number] BP 138/76  Pulse 77  Temp(Src) 98.1 F (36.7 C) (Oral)  Resp 20  Ht 6\' 2"  (1.88 m)  Wt 102.286 kg (225 lb 8 oz)  BMI 28.94 kg/m2  SpO2 98% Alert and oriented x 4 Moving all extremities well Drainage from inferior portion of wound, sutured today without difficulty Encouraging activity Possible discharge

## 2013-10-28 NOTE — Progress Notes (Signed)
Physical Therapy Treatment Patient Details Name: Peter [name] MRN: [id number] DOB: March 14, 1951 Today's Date: 10/28/2013 Time: 1027-2536 PT Time Calculation (min): 26 min  PT Assessment / Plan / Recommendation  History of Present Illness Pt rpesents with L4-5 PLIF and recent Cervical surgery.     PT Comments   Pt with increased pain and fatigue today limiting mobility.  Pt states he just feels weak.  Pt continues to need education on following back precautions during mobility, but does attempt mobility when asked.  Will continue to follow.    Follow Up Recommendations  Home health PT;Supervision/Assistance - 24 hour     Does the patient have the potential to tolerate intense rehabilitation     Barriers to Discharge        Equipment Recommendations  3in1 (PT)    Recommendations for Other Services    Frequency Min 5X/week   Progress towards PT Goals Progress towards PT goals: Not progressing toward goals - comment (Increased pain and fatigue today.  )  Plan Current plan remains appropriate    Precautions / Restrictions Precautions Precautions: Fall;Back Precaution Comments: Reviewed back precautions with pt. Restrictions Weight Bearing Restrictions: No   Pertinent Vitals/Pain "I'm just rotten."  Pt indicates premedicated.      Mobility  Bed Mobility Overal bed mobility: Needs Assistance Bed Mobility: Rolling;Sidelying to Sit;Sit to Sidelying Rolling: Min guard Sidelying to sit: Min assist Sit to sidelying: Min assist General bed mobility comments: A with bringing trunk up to sitting and bring LEs back into bed.   Transfers Overall transfer level: Needs assistance Equipment used: Rolling walker (2 wheeled) Transfers: Sit to/from Stand Sit to Stand: Mod assist General transfer comment: Requiring increased A to come to stand today.  pt indicates feeling weaker and with more pain.   Ambulation/Gait Ambulation/Gait assistance: Min guard Ambulation Distance (Feet): 180  Feet Assistive device: Rolling walker (2 wheeled) Gait Pattern/deviations: Step-through pattern;Decreased stride length;Decreased dorsiflexion - right;Decreased dorsiflexion - left;Trunk flexed General Gait Details: pt indicates increased fatigue and pain today limiting ambulation.  cues for upright posture and positioning within RW.      Exercises     PT Diagnosis:    PT Problem List:   PT Treatment Interventions:     PT Goals (current goals can now be found in the care plan section) Acute Rehab PT Goals PT Goal Formulation: With patient Time For Goal Achievement: 11/06/13 Potential to Achieve Goals: Good  Visit Information  Last PT Received On: 10/28/13 Assistance Needed: +1 History of Present Illness: Pt rpesents with L4-5 PLIF and recent Cervical surgery.      Subjective Data      Cognition  Cognition Arousal/Alertness: Awake/alert Behavior During Therapy: WFL for tasks assessed/performed Overall Cognitive Status: Within Functional Limits for tasks assessed    Balance  Balance Overall balance assessment: Needs assistance Standing balance support: Bilateral upper extremity supported Standing balance-Leahy Scale: Fair  End of Session PT - End of Session Equipment Utilized During Treatment: Gait belt Activity Tolerance: Patient tolerated treatment well Patient left: in bed;with call bell/phone within reach;with family/visitor present Nurse Communication: Mobility status   GP     Peter [name], [place] 10/28/2013, 11:48 AM

## 2013-10-29 ENCOUNTER — Encounter: Payer: 59 | Admitting: Occupational Therapy

## 2013-10-29 ENCOUNTER — Ambulatory Visit: Payer: 59 | Admitting: Physical Therapy

## 2013-10-29 NOTE — Progress Notes (Signed)
Physical Therapy Treatment Patient Details Name: Dayne Chait MRN: [id number] DOB: Dec 21, 1950 Today's Date: 10/29/2013 Time: 7628-3151 PT Time Calculation (min): 32 min  PT Assessment / Plan / Recommendation  History of Present Illness Pt rpesents with L4-5 PLIF and recent Cervical surgery.     PT Comments   Pt with much improved mobility today and eager for ambulation.  Will continue to follow.    Follow Up Recommendations  Home health PT;Supervision/Assistance - 24 hour     Does the patient have the potential to tolerate intense rehabilitation     Barriers to Discharge        Equipment Recommendations  3in1 (PT)    Recommendations for Other Services    Frequency Min 5X/week   Progress towards PT Goals Progress towards PT goals: Progressing toward goals  Plan Current plan remains appropriate    Precautions / Restrictions Precautions Precautions: Fall;Back Precaution Comments: Reviewed back precautions with pt. Restrictions Weight Bearing Restrictions: No   Pertinent Vitals/Pain Indicates pain is better today.      Mobility  Bed Mobility Overal bed mobility: Needs Assistance Bed Mobility: Rolling;Sidelying to Sit Rolling: Supervision Sidelying to sit: Supervision General bed mobility comments: pt with improved mobility today.   Transfers Overall transfer level: Needs assistance Equipment used: Rolling walker (2 wheeled) Transfers: Sit to/from Stand Sit to Stand: Min assist General transfer comment: pt continues to need lifting A to come to stand.   Ambulation/Gait Ambulation/Gait assistance: Supervision Ambulation Distance (Feet): 1000 Feet Assistive device: Rolling walker (2 wheeled) Gait Pattern/deviations: Step-through pattern;Decreased stride length;Trunk flexed General Gait Details: pt with improved mobility today and able to increasedistance and improved balance.   Stairs: Yes Stairs assistance: Min guard Stair Management: No rails;Step to  pattern;Backwards;With walker Number of Stairs: 1 General stair comments: cues for gait sequencing.      Exercises     PT Diagnosis:    PT Problem List:   PT Treatment Interventions:     PT Goals (current goals can now be found in the care plan section) Acute Rehab PT Goals Time For Goal Achievement: 11/06/13 Potential to Achieve Goals: Good  Visit Information  Last PT Received On: 10/29/13 Assistance Needed: +1 History of Present Illness: Pt rpesents with L4-5 PLIF and recent Cervical surgery.      Subjective Data      Cognition  Cognition Arousal/Alertness: Awake/alert Behavior During Therapy: WFL for tasks assessed/performed Overall Cognitive Status: Within Functional Limits for tasks assessed    Balance     End of Session PT - End of Session Equipment Utilized During Treatment: Gait belt Activity Tolerance: Patient tolerated treatment well Patient left: in chair;with call bell/phone within reach;with family/visitor present Nurse Communication: Mobility status   GP     Catarina Hartshorn, [place] 10/29/2013, 1:27 PM

## 2013-10-29 NOTE — Progress Notes (Signed)
Patient ID: Peter [name], male   DOB: 1951/04/21, 63 y.o.   MRN: [id number] BP 125/81  Pulse 75  Temp(Src) 97.7 F (36.5 C) (Oral)  Resp 18  Ht 6\' 2"  (1.88 m)  Wt 102.286 kg (225 lb 8 oz)  BMI 28.94 kg/m2  SpO2 96% Alert and oriented x 4 5/5 strength in lower extremities Wound is dry, no drainage since sutures Moving well  Possible discharge

## 2013-10-29 NOTE — Progress Notes (Signed)
Went to give patient his AM medications, checked BP and it was 84/48, HR 90. Asymptomatic, resting in chair. Patient stated he just returned from PT and walked the unit x2.  Restarted patients fluids and encouraged him to drink more water throughout the day. Emptied 900cc from foley from night shift.    Rechecked BP 30 mins later and its now 106/60 while still sitting in chair.  Held off on all BP medications and diuretics at this time until BP normalizes. Will continue IV fluids and monitor patient for symptoms of hypotension. Will notify MD if hypotension continues.

## 2013-10-29 NOTE — Care Management Note (Signed)
Page 1 of 2   10/30/2013     1:42:06 PM   CARE MANAGEMENT NOTE 10/30/2013  Patient:  Peter [name],Peter [name]   Account Number:  [id number]  Date Initiated:  10/27/2013  Documentation initiated by:  ROBARGE,COURTNEY  Subjective/Objective Assessment:   patient admitted for PLIF. Lives at home with wife. Patient has previously worked with outpatient therapy at [facility]     Action/Plan:   Will follow for discharge needs   Anticipated DC Date:     Anticipated DC Plan:  HOME W HOME HEALTH SERVICES      DC Planning Services  CM consult      Choice offered to / List presented to:  C-3 Spouse   DME arranged  3-N-1      DME agency  [facility]     HH arranged  HH-2 PT  HH-3 OT      HH agency  [facility]   Status of service:  Completed, signed off Medicare Important Message given?   (If response is "NO", the following Medicare IM given date fields will be blank) Date Medicare IM given:   Date Additional Medicare IM given:    Discharge Disposition:  HOME W HOME HEALTH SERVICES  Per UR Regulation:  Reviewed for med. necessity/level of care/duration of stay  If discussed at Long Length of Stay Meetings, dates discussed:    Comments:  10/30/13 1330 Courtney Robarge RN, MSN, CM- CM notifed Mary with [facility] that patient will be discharging today with HHPT/OT.  [facility] DME was notified of need for 3n1 for discharge home today.   10/29/13 1415 Courtney Robarge RN, MSN, CM- CM met with patient and wife regarding the possibility of discharging home with home health.  Following a previous surgery in November 2014, patient followed up with [facility].  If outpatient therapy is recommended, patient's wife requests that patient be sent back to that facility.  In anticipation of home health therapy, patient's wife has chosen to use [facility].  CM explained that the consult will be called to [facility] as a tentative discharge plan, pending Dr  Cabbell's decision regarding home health.  Per conversation with Dr Cabbell, home health needs will be assessed and orders placed at discharge, per his discretion.  Mary with [facility] was notified of potential referral pending physician orders. CM will continue to follow for final disposition plan.   

## 2013-10-30 ENCOUNTER — Encounter: Payer: 59 | Admitting: Occupational Therapy

## 2013-10-30 MED ORDER — OXYCODONE-ACETAMINOPHEN 5-325 MG PO TABS: 1.0000 | ORAL_TABLET | Freq: Four times a day (QID) | ORAL | Status: DC | PRN

## 2013-10-30 NOTE — Discharge Summary (Signed)
Physician Discharge Summary  Patient ID: Peter [name] MRN: [id number] DOB/AGE: [birth year] 62 y.o.  Admit date: 10/22/2013 Discharge date: 10/30/2013  Admission Diagnoses:spondylolisthesis L4/5, lumbar stenosis L4/5, neurogenic claudication L4/5  Discharge Diagnoses: spondylolisthesis L4/5, lumbar stenosis L4/5, neurogenic claudication L4/5  Active Problems:   Spondylolisthesis of lumbar region   Discharged Condition: good  Hospital Course: Peter [name] was taken to the operating room where he underwent lumbar decompression, and pedicle screw fixation. Post op he mobilized slowly with physical therapy. He also had his foley catheter replaced and wished to be sent home with a leg bag for his urologist to remove the catheter. His wound is clean, dry, without signs of infection. He is ambulating and tolerating a regular diet without difficulty.   Consults: None  Significant Diagnostic Studies: none  Treatments: surgery: Lumbar Four-Five decompression with posterior lumbar interbody fusion with interbody Peek  prostheses 18mm morselized autograft Lumbar decompression beyond what is necessary for Plif in order to decompress the L4 roots in the lateral recesses bilaterally  posterior lateral arthrodesis L4/5  posterior nonsegmental instrumentation Nuvasive instrumentation, Pedicle screws   Discharge Exam: Blood pressure 117/66, pulse 78, temperature 97.8 F (36.6 C), temperature source Oral, resp. rate 18, height 6\' 2"  (1.88 m), weight 102.286 kg (225 lb 8 oz), SpO2 99.00%. General appearance: alert, cooperative and appears stated age Good strength in lower extremities 5/5, normal muscle tone, and bulk  Disposition: 01-Home or Self Care  Discharge Orders   Future Orders Complete By Expires   Ambulatory referral to [facility]  As directed    Comments:     Please evaluate Peter [name] for admission to [facility].  Disciplines requested: Physical Therapy and Occupational  Therapy  Services to provide: Other: physical therapy  Physician to follow patient's care (the person listed here will be responsible for signing ongoing orders): Referring Provider  Requested Start of Care Date: Within 2-3 days  Special Instructions:  none       Medication List    STOP taking these medications       HYDROcodone-acetaminophen 5-325 MG per tablet  Commonly known as:  NORCO/VICODIN      TAKE these medications       acetaminophen 500 MG tablet  Commonly known as:  TYLENOL  Take 1,500 mg by mouth 2 (two) times daily as needed for pain.     albuterol 108 (90 BASE) MCG/ACT inhaler  Commonly known as:  PROVENTIL HFA;VENTOLIN HFA  Inhale 2 puffs into the lungs every 6 (six) hours as needed for wheezing.     aspirin EC 81 MG tablet  Take 81 mg by mouth 2 (two) times daily.     b complex vitamins tablet  Take 1 tablet by mouth daily.     carvedilol 12.5 MG tablet  Commonly known as:  COREG  Take 12.5 mg by mouth 2 (two) times daily with a meal.     digoxin 0.25 MG tablet  Commonly known as:  LANOXIN  Take 0.25 mg by mouth daily.     furosemide 20 MG tablet  Commonly known as:  LASIX  Take 1 tablet (20 mg total) by mouth daily.     isosorbide-hydrALAZINE 20-37.5 MG per tablet  Commonly known as:  BIDIL  Take 1 tablet by mouth 2 (two) times daily.     levothyroxine 175 MCG tablet  Commonly known as:  SYNTHROID, LEVOTHROID  Take 175 mcg by mouth daily before breakfast. Takes 175mg  and 50mg  to equal 225mg  daily  levothyroxine 50 MCG tablet  Commonly known as:  SYNTHROID, LEVOTHROID  Take 50 mcg by mouth daily before breakfast.     multivitamin with minerals Tabs tablet  Take 1 tablet by mouth daily.     olmesartan 20 MG tablet  Commonly known as:  BENICAR  Take 20 mg by mouth daily.     oxyCODONE-acetaminophen 5-325 MG per tablet  Commonly known as:  PERCOCET/ROXICET  Take 1-2 tablets by mouth every 6 (six) hours as needed for moderate pain.      senna-docusate 8.6-50 MG per tablet  Commonly known as:  Senokot-S  Take 1 tablet by mouth at bedtime as needed for mild constipation.     tamsulosin 0.4 MG Caps capsule  Commonly known as:  FLOMAX  Take 1 capsule (0.4 mg total) by mouth daily after supper.     traMADol 50 MG tablet  Commonly known as:  ULTRAM  Take 50 mg by mouth every 6 (six) hours as needed for pain.           Follow-up Information   Follow up with Yaniris Braddock L, MD In 4 weeks. (call office to make an appointment)    Specialty:  Neurosurgery   Contact information:   [address], [address]                         [place] [address] [phone]       Signed: Lauryn Lizardi L 10/30/2013, 6:28 PM

## 2013-10-30 NOTE — Discharge Instructions (Signed)
Lumbar Discectomy °Care After °A discectomy involves removal of discmaterial (the cartilage-like structures located between the bones of the back). It is done to relieve pressure on nerve roots. It can be used as a treatment for a back problem. The time in surgery depends on the findings in surgery and what is necessary to correct the problems. °HOME CARE INSTRUCTIONS  °· Check the cut (incision) made by the surgeon twice a day for signs of infection. Some signs of infection may include:  °· A foul smelling, greenish or yellowish discharge from the wound.  °· Increased pain.  °· Increased redness over the incision (operative) site.  °· The skin edges may separate.  °· Flu-like symptoms (problems).  °· A temperature above 101.5° F (38.6° C).  °· Change your bandages in about 24 to 36 hours following surgery or as directed.  °· You may shower tomrrow.  Avoid bathtubs, swimming pools and hot tubs for three weeks or until your incision has healed completely. °· Follow your doctor's instructions as to safe activities, exercises, and physical therapy.  °· Weight reduction may be beneficial if you are overweight.  °· Daily exercise is helpful to prevent the return of problems. Walking is permitted. You may use a treadmill without an incline. Cut down on activities and exercise if you have discomfort. You may also go up and down stairs as much as you can tolerate.  °· DO NOT lift anything heavier than 10 to 15 lbs. Avoid bending or twisting at the waist. Always bend your knees when lifting.  °· Maintain strength and range of motion as instructed.  °· Do not drive for 10 days, or as directed by your doctors. You may be a passenger . Lying back in the passenger seat may be more comfortable for you. Always wear a seatbelt.  °· Limit your sitting in a regular chair to 20 to 30 minutes at a time. There are no limitations for sitting in a recliner. You should lie down or walk in between sitting periods.  °· Only take  over-the-counter or prescription medicines for pain, discomfort, or fever as directed by your caregiver.  °SEEK MEDICAL CARE IF:  °· There is increased bleeding (more than a small spot) from the wound.  °· You notice redness, swelling, or increasing pain in the wound.  °· Pus is coming from wound.  °· You develop an unexplained oral temperature above 102° F (38.9° C) develops.  °· You notice a foul smell coming from the wound or dressing.  °· You have increasing pain in your wound.  °SEEK IMMEDIATE MEDICAL CARE IF:  °· You develop a rash.  °· You have difficulty breathing.  °· You develop any allergic problems to medicines given.  °Document Released: 08/16/2004 Document Revised: 08/31/2011 Document Reviewed: 12/05/2007 °ExitCare® Patient Information °

## 2013-11-03 ENCOUNTER — Encounter: Payer: 59 | Admitting: Occupational Therapy

## 2013-11-04 ENCOUNTER — Ambulatory Visit: Payer: 59 | Admitting: Occupational Therapy

## 2013-11-06 ENCOUNTER — Encounter: Payer: 59 | Admitting: Occupational Therapy

## 2013-11-07 ENCOUNTER — Encounter (HOSPITAL_COMMUNITY): Payer: 59 | Admitting: Anesthesiology

## 2013-11-07 ENCOUNTER — Observation Stay (HOSPITAL_COMMUNITY): Payer: 59 | Admitting: Anesthesiology

## 2013-11-07 ENCOUNTER — Encounter (HOSPITAL_COMMUNITY)
Admission: AD | Disposition: A | Discharge status: Home Health | Payer: Self-pay | Source: Ambulatory Visit | Attending: Neurosurgery

## 2013-11-07 ENCOUNTER — Inpatient Hospital Stay (HOSPITAL_COMMUNITY)
Admission: AD | Admit: 2013-11-07 | Discharge: 2013-11-21 | DRG: 863 | Disposition: A | Discharge status: Home Health | Payer: 59 | Source: Ambulatory Visit | Attending: Neurosurgery | Admitting: Neurosurgery

## 2013-11-07 ENCOUNTER — Encounter (HOSPITAL_COMMUNITY): Payer: Self-pay | Admitting: *Deleted

## 2013-11-07 DIAGNOSIS — I428 Other cardiomyopathies: Secondary | ICD-10-CM | POA: Diagnosis present

## 2013-11-07 DIAGNOSIS — I509 Heart failure, unspecified: Secondary | ICD-10-CM | POA: Diagnosis present

## 2013-11-07 DIAGNOSIS — T8149XA Infection following a procedure, other surgical site, initial encounter: Secondary | ICD-10-CM | POA: Diagnosis present

## 2013-11-07 DIAGNOSIS — Y838 Other surgical procedures as the cause of abnormal reaction of the patient, or of later complication, without mention of misadventure at the time of the procedure: Secondary | ICD-10-CM | POA: Diagnosis present

## 2013-11-07 DIAGNOSIS — Z6827 Body mass index (BMI) 27.0-27.9, adult: Secondary | ICD-10-CM

## 2013-11-07 DIAGNOSIS — Z881 Allergy status to other antibiotic agents status: Secondary | ICD-10-CM

## 2013-11-07 DIAGNOSIS — M4316 Spondylolisthesis, lumbar region: Secondary | ICD-10-CM | POA: Diagnosis present

## 2013-11-07 DIAGNOSIS — Z87891 Personal history of nicotine dependence: Secondary | ICD-10-CM

## 2013-11-07 DIAGNOSIS — I5042 Chronic combined systolic (congestive) and diastolic (congestive) heart failure: Secondary | ICD-10-CM | POA: Diagnosis present

## 2013-11-07 DIAGNOSIS — T8140XA Infection following a procedure, unspecified, initial encounter: Principal | ICD-10-CM | POA: Diagnosis present

## 2013-11-07 DIAGNOSIS — Z95810 Presence of automatic (implantable) cardiac defibrillator: Secondary | ICD-10-CM

## 2013-11-07 DIAGNOSIS — I5041 Acute combined systolic (congestive) and diastolic (congestive) heart failure: Secondary | ICD-10-CM | POA: Diagnosis present

## 2013-11-07 DIAGNOSIS — A4901 Methicillin susceptible Staphylococcus aureus infection, unspecified site: Secondary | ICD-10-CM | POA: Diagnosis present

## 2013-11-07 DIAGNOSIS — E039 Hypothyroidism, unspecified: Secondary | ICD-10-CM | POA: Diagnosis present

## 2013-11-07 DIAGNOSIS — I10 Essential (primary) hypertension: Secondary | ICD-10-CM | POA: Diagnosis present

## 2013-11-07 DIAGNOSIS — F1910 Other psychoactive substance abuse, uncomplicated: Secondary | ICD-10-CM | POA: Diagnosis present

## 2013-11-07 DIAGNOSIS — D649 Anemia, unspecified: Secondary | ICD-10-CM | POA: Diagnosis present

## 2013-11-07 DIAGNOSIS — Z7982 Long term (current) use of aspirin: Secondary | ICD-10-CM

## 2013-11-07 HISTORY — PX: LUMBAR WOUND DEBRIDEMENT: SHX1988

## 2013-11-07 LAB — GRAM STAIN

## 2013-11-07 SURGERY — LUMBAR WOUND DEBRIDEMENT
Anesthesia: General | Site: Back

## 2013-11-07 MED ORDER — MIDAZOLAM HCL 5 MG/5ML IJ SOLN
INTRAMUSCULAR | Status: DC | PRN
  Administered 2013-11-07: 2 mg via INTRAVENOUS

## 2013-11-07 MED ORDER — ACETAMINOPHEN 650 MG RE SUPP: 650.0000 mg | RECTAL | Status: DC | PRN

## 2013-11-07 MED ORDER — IRBESARTAN 150 MG PO TABS
150.0000 mg | ORAL_TABLET | Freq: Every day | ORAL | Status: DC
  Administered 2013-11-09 – 2013-11-21 (×13): 150 mg via ORAL
  Filled 2013-11-07 (×14): qty 1

## 2013-11-07 MED ORDER — ONDANSETRON HCL 4 MG/2ML IJ SOLN: 4.0000 mg | INTRAMUSCULAR | Status: DC | PRN

## 2013-11-07 MED ORDER — 0.9 % SODIUM CHLORIDE (POUR BTL) OPTIME
TOPICAL | Status: DC | PRN
  Administered 2013-11-07 (×4): 1000 mL

## 2013-11-07 MED ORDER — ONDANSETRON HCL 4 MG/2ML IJ SOLN: 4.0000 mg | Freq: Once | INTRAMUSCULAR | Status: DC | PRN

## 2013-11-07 MED ORDER — SODIUM CHLORIDE 0.9 % IJ SOLN
3.0000 mL | Freq: Two times a day (BID) | INTRAMUSCULAR | Status: DC
  Administered 2013-11-12 – 2013-11-15 (×2): 3 mL via INTRAVENOUS

## 2013-11-07 MED ORDER — HEMOSTATIC AGENTS (NO CHARGE) OPTIME
TOPICAL | Status: DC | PRN
  Administered 2013-11-07: 1 via TOPICAL

## 2013-11-07 MED ORDER — LABETALOL HCL 5 MG/ML IV SOLN
INTRAVENOUS | Status: AC
  Filled 2013-11-07: qty 4

## 2013-11-07 MED ORDER — HYDROMORPHONE 0.3 MG/ML IV SOLN
INTRAVENOUS | Status: DC
  Administered 2013-11-07: 22:00:00 via INTRAVENOUS
  Administered 2013-11-08: 0.9 mg via INTRAVENOUS
  Administered 2013-11-08: 0.562 mg via INTRAVENOUS
  Administered 2013-11-08: 19:00:00 via INTRAVENOUS
  Administered 2013-11-08: 3 mg via INTRAVENOUS
  Administered 2013-11-08: 1.2 mg via INTRAVENOUS
  Administered 2013-11-09: 0.9 mg via INTRAVENOUS
  Administered 2013-11-09: 2.4 mg via INTRAVENOUS
  Administered 2013-11-09: 3 mg via INTRAVENOUS
  Administered 2013-11-09: 1.8 mg via INTRAVENOUS
  Administered 2013-11-09: 2.1 mg via INTRAVENOUS
  Administered 2013-11-09: 0.112 mg via INTRAVENOUS
  Administered 2013-11-10: 2.7 mg via INTRAVENOUS
  Administered 2013-11-10: 10:00:00 via INTRAVENOUS
  Administered 2013-11-10: 2.4 mg via INTRAVENOUS
  Administered 2013-11-10: 2.1 mg via INTRAVENOUS
  Administered 2013-11-10: 1.2 mg via INTRAVENOUS
  Administered 2013-11-11: 2.1 mg via INTRAVENOUS
  Administered 2013-11-11: 1.2 mg via INTRAVENOUS
  Administered 2013-11-11: 04:00:00 via INTRAVENOUS
  Administered 2013-11-11: 1.2 mg via INTRAVENOUS
  Administered 2013-11-11: 3.96 mg via INTRAVENOUS
  Administered 2013-11-11: 2.1 mg via INTRAVENOUS
  Administered 2013-11-12: 3.3 mL via INTRAVENOUS
  Administered 2013-11-12: 1.5 mg via INTRAVENOUS
  Administered 2013-11-12: 0.9 mg via INTRAVENOUS
  Administered 2013-11-12: 2.1 mg via INTRAVENOUS
  Administered 2013-11-12: 11:00:00 via INTRAVENOUS
  Administered 2013-11-12: 0.78 mL via INTRAVENOUS
  Administered 2013-11-13: 0.6 mL via INTRAVENOUS
  Administered 2013-11-13: 0.9 mL via INTRAVENOUS
  Administered 2013-11-13: 2.4 mg via INTRAVENOUS
  Administered 2013-11-13: 16:00:00 via INTRAVENOUS
  Administered 2013-11-14: 0.9 mg via INTRAVENOUS
  Administered 2013-11-14: 14:00:00 via INTRAVENOUS
  Administered 2013-11-14: 1.2 mg via INTRAVENOUS
  Administered 2013-11-14: 2.39 mg via INTRAVENOUS
  Administered 2013-11-14: 0.3 mg via INTRAVENOUS
  Administered 2013-11-14: 2.1 mg via INTRAVENOUS
  Administered 2013-11-14: 1.2 mg via INTRAVENOUS
  Administered 2013-11-15: 0.6 mg via INTRAVENOUS
  Administered 2013-11-15: 0.9 mg via INTRAVENOUS
  Administered 2013-11-15: 1.8 mg via INTRAVENOUS
  Administered 2013-11-15: 0.96 mg via INTRAVENOUS
  Administered 2013-11-15: 0.463 mg via INTRAVENOUS
  Administered 2013-11-15: 1.8 mg via INTRAVENOUS
  Administered 2013-11-15: 0.6 mg via INTRAVENOUS
  Administered 2013-11-16: 0.3 mg via INTRAVENOUS
  Administered 2013-11-16 (×2): 0.9 mg via INTRAVENOUS
  Administered 2013-11-16: 1.8 mg via INTRAVENOUS
  Administered 2013-11-16 – 2013-11-17 (×2): 0.9 mg via INTRAVENOUS
  Administered 2013-11-17: 0.887 mg via INTRAVENOUS
  Administered 2013-11-17: 1 mg via INTRAVENOUS
  Administered 2013-11-17: 0.887 mg via INTRAVENOUS
  Administered 2013-11-17: 0.5 mg via INTRAVENOUS
  Administered 2013-11-18: 0.6 mg via INTRAVENOUS
  Administered 2013-11-19: 12:00:00 via INTRAVENOUS
  Filled 2013-11-07 (×12): qty 25

## 2013-11-07 MED ORDER — ISOSORB DINITRATE-HYDRALAZINE 20-37.5 MG PO TABS
1.0000 | ORAL_TABLET | Freq: Two times a day (BID) | ORAL | Status: DC
  Administered 2013-11-08 – 2013-11-21 (×27): 1 via ORAL
  Filled 2013-11-07 (×29): qty 1

## 2013-11-07 MED ORDER — ACETAMINOPHEN 325 MG PO TABS
650.0000 mg | ORAL_TABLET | ORAL | Status: DC | PRN
  Administered 2013-11-14 – 2013-11-21 (×20): 650 mg via ORAL
  Filled 2013-11-07 (×20): qty 2

## 2013-11-07 MED ORDER — OXYCODONE HCL 5 MG PO TABS: 5.0000 mg | ORAL_TABLET | Freq: Once | ORAL | Status: DC | PRN

## 2013-11-07 MED ORDER — MEPERIDINE HCL 25 MG/ML IJ SOLN: 6.2500 mg | INTRAMUSCULAR | Status: DC | PRN

## 2013-11-07 MED ORDER — TAMSULOSIN HCL 0.4 MG PO CAPS
0.4000 mg | ORAL_CAPSULE | Freq: Every day | ORAL | Status: DC
  Administered 2013-11-08 – 2013-11-21 (×14): 0.4 mg via ORAL
  Filled 2013-11-07 (×14): qty 1

## 2013-11-07 MED ORDER — FENTANYL CITRATE 0.05 MG/ML IJ SOLN
INTRAMUSCULAR | Status: DC | PRN
  Administered 2013-11-07: 100 ug via INTRAVENOUS
  Administered 2013-11-07: 150 ug via INTRAVENOUS

## 2013-11-07 MED ORDER — SENNA 8.6 MG PO TABS
1.0000 | ORAL_TABLET | Freq: Two times a day (BID) | ORAL | Status: DC
  Administered 2013-11-08 – 2013-11-21 (×26): 8.6 mg via ORAL
  Filled 2013-11-07 (×33): qty 1

## 2013-11-07 MED ORDER — DIPHENHYDRAMINE HCL 50 MG/ML IJ SOLN: 12.5000 mg | Freq: Four times a day (QID) | INTRAMUSCULAR | Status: DC | PRN

## 2013-11-07 MED ORDER — B COMPLEX-C PO TABS
1.0000 | ORAL_TABLET | Freq: Every day | ORAL | Status: DC
  Administered 2013-11-08 – 2013-11-21 (×14): 1 via ORAL
  Filled 2013-11-07 (×15): qty 1

## 2013-11-07 MED ORDER — VANCOMYCIN HCL IN DEXTROSE 1-5 GM/200ML-% IV SOLN
INTRAVENOUS | Status: AC
  Administered 2013-11-07: 1000 mg via INTRAVENOUS
  Filled 2013-11-07: qty 200

## 2013-11-07 MED ORDER — DIGOXIN 250 MCG PO TABS
0.2500 mg | ORAL_TABLET | Freq: Every day | ORAL | Status: DC
  Administered 2013-11-08 – 2013-11-21 (×14): 0.25 mg via ORAL
  Filled 2013-11-07 (×14): qty 1

## 2013-11-07 MED ORDER — ALBUTEROL SULFATE (2.5 MG/3ML) 0.083% IN NEBU
3.0000 mL | INHALATION_SOLUTION | Freq: Four times a day (QID) | RESPIRATORY_TRACT | Status: DC | PRN
  Administered 2013-11-11 – 2013-11-14 (×3): 3 mL via RESPIRATORY_TRACT
  Filled 2013-11-07 (×3): qty 3

## 2013-11-07 MED ORDER — SODIUM CHLORIDE 0.9 % IJ SOLN: 9.0000 mL | INTRAMUSCULAR | Status: DC | PRN

## 2013-11-07 MED ORDER — PHENOL 1.4 % MT LIQD: 1.0000 | OROMUCOSAL | Status: DC | PRN

## 2013-11-07 MED ORDER — LABETALOL HCL 5 MG/ML IV SOLN
10.0000 mg | INTRAVENOUS | Status: DC | PRN
Start: 2013-11-07 — End: 2013-11-21
  Administered 2013-11-07: 10 mg via INTRAVENOUS

## 2013-11-07 MED ORDER — ADULT MULTIVITAMIN W/MINERALS CH
1.0000 | ORAL_TABLET | Freq: Every day | ORAL | Status: DC
  Administered 2013-11-08 – 2013-11-21 (×14): 1 via ORAL
  Filled 2013-11-07 (×14): qty 1

## 2013-11-07 MED ORDER — LACTATED RINGERS IV SOLN
INTRAVENOUS | Status: DC | PRN
  Administered 2013-11-07 (×2): via INTRAVENOUS

## 2013-11-07 MED ORDER — ONDANSETRON HCL 4 MG/2ML IJ SOLN
INTRAMUSCULAR | Status: DC | PRN
  Administered 2013-11-07: 4 mg via INTRAVENOUS

## 2013-11-07 MED ORDER — MENTHOL 3 MG MT LOZG: 1.0000 | LOZENGE | OROMUCOSAL | Status: DC | PRN

## 2013-11-07 MED ORDER — SODIUM CHLORIDE 0.9 % IV SOLN: 250.0000 mL | INTRAVENOUS | Status: DC

## 2013-11-07 MED ORDER — DIPHENHYDRAMINE HCL 12.5 MG/5ML PO ELIX: 12.5000 mg | ORAL_SOLUTION | Freq: Four times a day (QID) | ORAL | Status: DC | PRN

## 2013-11-07 MED ORDER — ONDANSETRON HCL 4 MG/2ML IJ SOLN: 4.0000 mg | Freq: Four times a day (QID) | INTRAMUSCULAR | Status: DC | PRN

## 2013-11-07 MED ORDER — OXYCODONE HCL 5 MG/5ML PO SOLN: 5.0000 mg | Freq: Once | ORAL | Status: DC | PRN

## 2013-11-07 MED ORDER — SUCCINYLCHOLINE CHLORIDE 20 MG/ML IJ SOLN
INTRAMUSCULAR | Status: DC | PRN
  Administered 2013-11-07: 100 mg via INTRAVENOUS

## 2013-11-07 MED ORDER — VANCOMYCIN HCL IN DEXTROSE 1-5 GM/200ML-% IV SOLN
1000.0000 mg | Freq: Three times a day (TID) | INTRAVENOUS | Status: DC
  Administered 2013-11-07 – 2013-11-16 (×28): 1000 mg via INTRAVENOUS
  Filled 2013-11-07 (×29): qty 200

## 2013-11-07 MED ORDER — HYDROMORPHONE 0.3 MG/ML IV SOLN
INTRAVENOUS | Status: AC
  Administered 2013-11-07: 0.6 mg
  Filled 2013-11-07: qty 25

## 2013-11-07 MED ORDER — NALOXONE HCL 0.4 MG/ML IJ SOLN: 0.4000 mg | INTRAMUSCULAR | Status: DC | PRN

## 2013-11-07 MED ORDER — FUROSEMIDE 20 MG PO TABS
20.0000 mg | ORAL_TABLET | Freq: Every day | ORAL | Status: DC
  Administered 2013-11-08 – 2013-11-21 (×14): 20 mg via ORAL
  Filled 2013-11-07 (×14): qty 1

## 2013-11-07 MED ORDER — HYDROMORPHONE HCL PF 1 MG/ML IJ SOLN
INTRAMUSCULAR | Status: AC
  Administered 2013-11-07: 0.5 mg via INTRAVENOUS
  Filled 2013-11-07: qty 1

## 2013-11-07 MED ORDER — CARVEDILOL 12.5 MG PO TABS
12.5000 mg | ORAL_TABLET | Freq: Two times a day (BID) | ORAL | Status: DC
  Administered 2013-11-08 – 2013-11-21 (×28): 12.5 mg via ORAL
  Filled 2013-11-07 (×30): qty 1

## 2013-11-07 MED ORDER — SENNOSIDES-DOCUSATE SODIUM 8.6-50 MG PO TABS: 1.0000 | ORAL_TABLET | Freq: Every evening | ORAL | Status: DC | PRN

## 2013-11-07 MED ORDER — ASPIRIN EC 81 MG PO TBEC
81.0000 mg | DELAYED_RELEASE_TABLET | Freq: Two times a day (BID) | ORAL | Status: DC
  Administered 2013-11-08 – 2013-11-21 (×28): 81 mg via ORAL
  Filled 2013-11-07 (×29): qty 1

## 2013-11-07 MED ORDER — LEVOTHYROXINE SODIUM 50 MCG PO TABS
50.0000 ug | ORAL_TABLET | Freq: Every day | ORAL | Status: DC
  Administered 2013-11-08: 50 ug via ORAL
  Filled 2013-11-07 (×2): qty 1

## 2013-11-07 MED ORDER — B COMPLEX PO TABS: 1.0000 | ORAL_TABLET | Freq: Every day | ORAL | Status: DC

## 2013-11-07 MED ORDER — ALUM & MAG HYDROXIDE-SIMETH 200-200-20 MG/5ML PO SUSP: 30.0000 mL | Freq: Four times a day (QID) | ORAL | Status: DC | PRN

## 2013-11-07 MED ORDER — HYDROCODONE-ACETAMINOPHEN 5-325 MG PO TABS
1.0000 | ORAL_TABLET | ORAL | Status: DC | PRN
  Administered 2013-11-08 – 2013-11-14 (×2): 2 via ORAL
  Filled 2013-11-07 (×2): qty 2

## 2013-11-07 MED ORDER — HYDROMORPHONE HCL PF 1 MG/ML IJ SOLN
0.2500 mg | INTRAMUSCULAR | Status: DC | PRN
  Administered 2013-11-07: 0.5 mg via INTRAVENOUS

## 2013-11-07 MED ORDER — LIDOCAINE HCL (CARDIAC) 20 MG/ML IV SOLN
INTRAVENOUS | Status: DC | PRN
  Administered 2013-11-07: 100 mg via INTRAVENOUS

## 2013-11-07 MED ORDER — PROPOFOL 10 MG/ML IV BOLUS
INTRAVENOUS | Status: DC | PRN
  Administered 2013-11-07: 100 mg via INTRAVENOUS

## 2013-11-07 MED ORDER — LEVOTHYROXINE SODIUM 175 MCG PO TABS
175.0000 ug | ORAL_TABLET | Freq: Every day | ORAL | Status: DC
  Administered 2013-11-08: 175 ug via ORAL
  Filled 2013-11-07 (×2): qty 1

## 2013-11-07 MED ORDER — SODIUM CHLORIDE 0.9 % IJ SOLN: 3.0000 mL | INTRAMUSCULAR | Status: DC | PRN

## 2013-11-07 MED ORDER — POTASSIUM CHLORIDE IN NACL 20-0.9 MEQ/L-% IV SOLN
INTRAVENOUS | Status: DC
  Administered 2013-11-08 – 2013-11-21 (×14): via INTRAVENOUS
  Filled 2013-11-07 (×30): qty 1000

## 2013-11-07 SURGICAL SUPPLY — 63 items
500ML CANISTER FOR INFOVAC ×3 IMPLANT
BAG DECANTER FOR FLEXI CONT (MISCELLANEOUS) IMPLANT
BENZOIN TINCTURE PRP APPL 2/3 (GAUZE/BANDAGES/DRESSINGS) IMPLANT
BLADE SURG ROTATE 9660 (MISCELLANEOUS) IMPLANT
CANISTER SUCT 3000ML (MISCELLANEOUS) ×3 IMPLANT
CLOSURE WOUND 1/2 X4 (GAUZE/BANDAGES/DRESSINGS)
CONT SPEC 4OZ CLIKSEAL STRL BL (MISCELLANEOUS) ×3 IMPLANT
DECANTER SPIKE VIAL GLASS SM (MISCELLANEOUS) ×3 IMPLANT
DRAPE LAPAROTOMY 100X72X124 (DRAPES) ×3 IMPLANT
DRAPE POUCH INSTRU U-SHP 10X18 (DRAPES) ×3 IMPLANT
DRAPE SURG 17X23 STRL (DRAPES) IMPLANT
DRSG VAC ATS MED SENSATRAC (GAUZE/BANDAGES/DRESSINGS) ×6 IMPLANT
DURAPREP 26ML APPLICATOR (WOUND CARE) ×3 IMPLANT
ELECT REM PT RETURN 9FT ADLT (ELECTROSURGICAL) ×3
ELECTRODE REM PT RTRN 9FT ADLT (ELECTROSURGICAL) ×1 IMPLANT
GAUZE SPONGE 4X4 16PLY XRAY LF (GAUZE/BANDAGES/DRESSINGS) IMPLANT
GLOVE BIO SURGEON STRL SZ 6.5 (GLOVE) ×8 IMPLANT
GLOVE BIO SURGEON STRL SZ7 (GLOVE) IMPLANT
GLOVE BIO SURGEON STRL SZ7.5 (GLOVE) IMPLANT
GLOVE BIO SURGEON STRL SZ8 (GLOVE) IMPLANT
GLOVE BIO SURGEON STRL SZ8.5 (GLOVE) IMPLANT
GLOVE BIO SURGEONS STRL SZ 6.5 (GLOVE) ×4
GLOVE BIOGEL M 8.0 STRL (GLOVE) IMPLANT
GLOVE ECLIPSE 6.5 STRL STRAW (GLOVE) ×3 IMPLANT
GLOVE ECLIPSE 7.0 STRL STRAW (GLOVE) IMPLANT
GLOVE ECLIPSE 7.5 STRL STRAW (GLOVE) IMPLANT
GLOVE ECLIPSE 8.0 STRL XLNG CF (GLOVE) IMPLANT
GLOVE ECLIPSE 8.5 STRL (GLOVE) IMPLANT
GLOVE EXAM NITRILE LRG STRL (GLOVE) IMPLANT
GLOVE EXAM NITRILE MD LF STRL (GLOVE) IMPLANT
GLOVE EXAM NITRILE XL STR (GLOVE) IMPLANT
GLOVE EXAM NITRILE XS STR PU (GLOVE) IMPLANT
GLOVE INDICATOR 6.5 STRL GRN (GLOVE) IMPLANT
GLOVE INDICATOR 7.0 STRL GRN (GLOVE) IMPLANT
GLOVE INDICATOR 7.5 STRL GRN (GLOVE) IMPLANT
GLOVE INDICATOR 8.0 STRL GRN (GLOVE) IMPLANT
GLOVE INDICATOR 8.5 STRL (GLOVE) IMPLANT
GLOVE OPTIFIT SS 8.0 STRL (GLOVE) IMPLANT
GLOVE SURG SS PI 6.5 STRL IVOR (GLOVE) IMPLANT
GOWN BRE IMP SLV AUR LG STRL (GOWN DISPOSABLE) IMPLANT
GOWN BRE IMP SLV AUR XL STRL (GOWN DISPOSABLE) IMPLANT
GOWN STRL REIN 2XL LVL4 (GOWN DISPOSABLE) IMPLANT
GOWN STRL REUS W/ TWL LRG LVL3 (GOWN DISPOSABLE) ×3 IMPLANT
GOWN STRL REUS W/TWL LRG LVL3 (GOWN DISPOSABLE) ×6
KIT BASIN OR (CUSTOM PROCEDURE TRAY) ×3 IMPLANT
KIT ROOM TURNOVER OR (KITS) ×3 IMPLANT
NS IRRIG 1000ML POUR BTL (IV SOLUTION) ×12 IMPLANT
PACK LAMINECTOMY NEURO (CUSTOM PROCEDURE TRAY) ×3 IMPLANT
PAD ARMBOARD 7.5X6 YLW CONV (MISCELLANEOUS) ×9 IMPLANT
SPONGE GAUZE 4X4 12PLY (GAUZE/BANDAGES/DRESSINGS) IMPLANT
SPONGE LAP 4X18 X RAY DECT (DISPOSABLE) IMPLANT
SPONGE SURGIFOAM ABS GEL SZ50 (HEMOSTASIS) ×3 IMPLANT
STRIP CLOSURE SKIN 1/2X4 (GAUZE/BANDAGES/DRESSINGS) IMPLANT
SUT VIC AB 0 CT1 18XCR BRD8 (SUTURE) ×1 IMPLANT
SUT VIC AB 0 CT1 8-18 (SUTURE) ×2
SUT VIC AB 2-0 CT1 18 (SUTURE) ×3 IMPLANT
SUT VIC AB 3-0 SH 8-18 (SUTURE) ×3 IMPLANT
SWAB CULTURE LIQ STUART DBL (MISCELLANEOUS) ×3 IMPLANT
SYR 20ML ECCENTRIC (SYRINGE) ×3 IMPLANT
TOWEL OR 17X24 6PK STRL BLUE (TOWEL DISPOSABLE) ×3 IMPLANT
TOWEL OR 17X26 10 PK STRL BLUE (TOWEL DISPOSABLE) ×3 IMPLANT
TUBE ANAEROBIC SPECIMEN COL (MISCELLANEOUS) ×3 IMPLANT
WATER STERILE IRR 1000ML POUR (IV SOLUTION) ×3 IMPLANT

## 2013-11-07 NOTE — Anesthesia Procedure Notes (Signed)
Procedure Name: Intubation Date/Time: 11/07/2013 8:21 PM Performed by: Mosie Epstein Pre-anesthesia Checklist: Patient identified, Timeout performed, Emergency Drugs available, Suction available and Patient being monitored Patient Re-evaluated:Patient Re-evaluated prior to inductionOxygen Delivery Method: Circle system utilized Preoxygenation: Pre-oxygenation with 100% oxygen Intubation Type: IV induction and Rapid sequence Ventilation: Mask ventilation without difficulty Laryngoscope Size: Mac and 4 Grade View: Grade II Tube type: Oral Tube size: 8.0 mm Number of attempts: 1 Airway Equipment and Method: Stylet Placement Confirmation: ETT inserted through vocal cords under direct vision,  positive ETCO2 and breath sounds checked- equal and bilateral Secured at: 23 cm Tube secured with: Tape Dental Injury: Teeth and Oropharynx as per pre-operative assessment

## 2013-11-07 NOTE — Transfer of Care (Signed)
Immediate Anesthesia Transfer of Care Note  Patient: Peter [name]  Procedure(s) Performed: Procedure(s): LUMBAR WOUND DEBRIDEMENT (N/A)  Patient Location: PACU  Anesthesia Type:General  Level of Consciousness: awake, alert  and oriented  Airway & Oxygen Therapy: Patient Spontanous Breathing and Patient connected to nasal cannula oxygen  Post-op Assessment: Report given to PACU RN and Post -op Vital signs reviewed and stable  Post vital signs: Reviewed and stable  Complications: No apparent anesthesia complications

## 2013-11-07 NOTE — Preoperative (Signed)
Beta Blockers   Reason not to administer Beta Blockers:Not Applicable, took this am @ 0200

## 2013-11-07 NOTE — Progress Notes (Addendum)
Patient returned to room 4N24 from PACU at 2230. Alert and in stable condition with family at side.

## 2013-11-07 NOTE — H&P (Signed)
Peter [name] is an 63 y.o. male.   Chief Complaint: fever, purulent wound drainage HPI: Status post lumbar fusion. While at home developed purulent drainage from wound and fever. Admitted from our office today for wound exploration.   Past Medical History  Diagnosis Date  . Allergy   . Asthma   . Hypertension   . Substance abuse   . Acute combined systolic and diastolic heart failure 03/22/3150  . Shortness of breath     with asthma  . Arthritis   . Cardiomyopathy   . Automatic implantable cardioverter-defibrillator in situ     portable defibrilator  . Complication of anesthesia     problems urinating after anesthesia  . Hypothyroidism     Past Surgical History  Procedure Laterality Date  . Cardiac catheterization    . Anterior cervical decomp/discectomy fusion N/A 07/30/2013    Procedure: CERVICAL FIVE,CERVICAL SIX CORPECTOMY,ANTERIOR ARTHRODESIS,PEEK INTERBODY GRAFT CERVICAL FOUR-CERVICAL SEVEN,ANTERIOR INSTRUMENTATION;  Surgeon: Winfield Cunas, MD;  Location: [facility];  Service: Neurosurgery;  Laterality: N/A;    No family history on file. Social History:  reports that he quit smoking about 18 years ago. He has never used smokeless tobacco. He reports that he does not drink alcohol or use illicit drugs.  Allergies:  Allergies  Allergen Reactions  . Keflex [Cephalexin] Anaphylaxis    Medications Prior to Admission  Medication Sig Dispense Refill  . acetaminophen (TYLENOL) 500 MG tablet Take 1,500 mg by mouth 2 (two) times daily as needed for pain.       Marland Kitchen albuterol (PROVENTIL HFA;VENTOLIN HFA) 108 (90 BASE) MCG/ACT inhaler Inhale 2 puffs into the lungs every 6 (six) hours as needed for wheezing.      Marland Kitchen aspirin EC 81 MG tablet Take 81 mg by mouth 2 (two) times daily.      Marland Kitchen b complex vitamins tablet Take 1 tablet by mouth daily.      . carvedilol (COREG) 12.5 MG tablet Take 12.5 mg by mouth 2 (two) times daily with a meal.      . digoxin (LANOXIN) 0.25 MG tablet  Take 0.25 mg by mouth daily.      . furosemide (LASIX) 20 MG tablet Take 1 tablet (20 mg total) by mouth daily.  30 tablet    . isosorbide-hydrALAZINE (BIDIL) 20-37.5 MG per tablet Take 1 tablet by mouth 2 (two) times daily.  90 tablet  2  . levothyroxine (SYNTHROID, LEVOTHROID) 175 MCG tablet Take 175 mcg by mouth daily before breakfast. Takes 175mg  and 50mg  to equal 225mg  daily      . levothyroxine (SYNTHROID, LEVOTHROID) 50 MCG tablet Take 50 mcg by mouth daily before breakfast.      . Multiple Vitamin (MULTIVITAMIN WITH MINERALS) TABS Take 1 tablet by mouth daily.      Marland Kitchen olmesartan (BENICAR) 20 MG tablet Take 20 mg by mouth daily.      Marland Kitchen oxyCODONE-acetaminophen (PERCOCET/ROXICET) 5-325 MG per tablet Take 1-2 tablets by mouth every 6 (six) hours as needed for moderate pain.  90 tablet  0  . senna-docusate (SENOKOT-S) 8.6-50 MG per tablet Take 1 tablet by mouth at bedtime as needed for mild constipation.      . tamsulosin (FLOMAX) 0.4 MG CAPS capsule Take 1 capsule (0.4 mg total) by mouth daily after supper.  30 capsule  1  . traMADol (ULTRAM) 50 MG tablet Take 50 mg by mouth every 6 (six) hours as needed for pain.        No  results found for this or any previous visit (from the past 69 hour(s)). No results found.  Review of Systems  Constitutional: Positive for fever and malaise/fatigue.  HENT: Negative.   Eyes: Negative.   Respiratory: Negative.   Cardiovascular: Negative.   Gastrointestinal: Negative.   Genitourinary: Negative.   Musculoskeletal: Positive for back pain.  Skin: Negative.   Neurological: Negative.   Endo/Heme/Allergies: Negative.   Psychiatric/Behavioral: Negative.     There were no vitals taken for this visit. Physical Exam  Constitutional: He is oriented to person, place, and time. He appears well-developed and well-nourished. He appears distressed.  HENT:  Head: Normocephalic and atraumatic.  Eyes: Conjunctivae and EOM are normal. Pupils are equal, round,  and reactive to light.  Neck: Normal range of motion. Neck supple.  Cardiovascular: Normal rate and regular rhythm.   Respiratory: Effort normal and breath sounds normal.  GI: Soft. Bowel sounds are normal.  Musculoskeletal: Normal range of motion.  Neurological: He is alert and oriented to person, place, and time. He has normal reflexes. He displays no atrophy, no tremor and normal reflexes. No cranial nerve deficit. He exhibits normal muscle tone. He displays no seizure activity. Coordination normal.  Cervical myelopathy, hand, intrinsic weakness  Skin:        Assessment/Plan Or for wound debridement  Peter [name] 11/07/2013, 6:42 PM   2

## 2013-11-07 NOTE — Progress Notes (Signed)
Patient admitted to room 4N24 at 1830 via direct admit from MD's office. Alert and in stable condition. Awaitng to be transported to OR for wound debridement.

## 2013-11-07 NOTE — Progress Notes (Signed)
Patient is transported to OR at this time via bed. Wife at his side.

## 2013-11-07 NOTE — Progress Notes (Signed)
ANTIBIOTIC CONSULT NOTE - INITIAL  Pharmacy Consult for Vancomycin Indication: Wound Infection  Allergies  Allergen Reactions  . Keflex [Cephalexin] Anaphylaxis    Patient Measurements: Height: 6\' 2"  (188 cm) Weight: 214 lb 3.2 oz (97.16 kg) IBW/kg (Calculated) : 82.2  Vital Signs: Temp: 99.2 F (37.3 C) (02/13 2120) Temp src: Oral (02/13 1841) BP: 157/91 mmHg (02/13 2145) Pulse Rate: 94 (02/13 2145) Intake/Output from previous day:   Intake/Output from this shift: Total I/O In: 1000 [I.V.:1000] Out: 10 [Blood:10]  Labs: No results found for this basename: WBC, HGB, PLT, LABCREA, CREATININE,  in the last 72 hours Estimated Creatinine Clearance: 108.6 ml/min (by C-G formula based on Cr of 0.82). No results found for this basename: VANCOTROUGH, Corlis Leak, VANCORANDOM, GENTTROUGH, GENTPEAK, GENTRANDOM, TOBRATROUGH, TOBRAPEAK, TOBRARND, AMIKACINPEAK, AMIKACINTROU, AMIKACIN,  in the last 72 hours   Microbiology: Recent Results (from the past 720 hour(s))  SURGICAL PCR SCREEN     Status: None   Collection Time    10/17/13 12:59 PM      Result Value Ref Range Status   MRSA, PCR NEGATIVE  NEGATIVE Final   Staphylococcus aureus NEGATIVE  NEGATIVE Final   Comment:            The Xpert SA Assay (FDA     approved for NASAL specimens     in patients over 11 years of age),     is one component of     a comprehensive surveillance     program.  Test performance has     been validated by [employer] for patients greater     than or equal to 50 year old.     It is not intended     to diagnose infection nor to     guide or monitor treatment.    Medical History: Past Medical History  Diagnosis Date  . Allergy   . Asthma   . Hypertension   . Substance abuse   . Acute combined systolic and diastolic heart failure 2/72/5366  . Shortness of breath     with asthma  . Arthritis   . Cardiomyopathy   . Automatic implantable cardioverter-defibrillator in situ     portable  defibrilator  . Complication of anesthesia     problems urinating after anesthesia  . Hypothyroidism     Medications:  Prescriptions prior to admission  Medication Sig Dispense Refill  . acetaminophen (TYLENOL) 500 MG tablet Take 1,500 mg by mouth 2 (two) times daily as needed for pain.       Marland Kitchen albuterol (PROVENTIL HFA;VENTOLIN HFA) 108 (90 BASE) MCG/ACT inhaler Inhale 2 puffs into the lungs every 6 (six) hours as needed for wheezing.      Marland Kitchen aspirin EC 81 MG tablet Take 81 mg by mouth 2 (two) times daily.      Marland Kitchen b complex vitamins tablet Take 1 tablet by mouth daily.      . carvedilol (COREG) 12.5 MG tablet Take 12.5 mg by mouth 2 (two) times daily with a meal.      . digoxin (LANOXIN) 0.25 MG tablet Take 0.25 mg by mouth daily.      . furosemide (LASIX) 20 MG tablet Take 1 tablet (20 mg total) by mouth daily.  30 tablet    . isosorbide-hydrALAZINE (BIDIL) 20-37.5 MG per tablet Take 1 tablet by mouth 2 (two) times daily.  90 tablet  2  . levothyroxine (SYNTHROID, LEVOTHROID) 175 MCG tablet Take 175 mcg by  mouth daily before breakfast. Takes 175mg  and 50mg  to equal 225mg  daily      . levothyroxine (SYNTHROID, LEVOTHROID) 50 MCG tablet Take 50 mcg by mouth daily before breakfast.      . Multiple Vitamin (MULTIVITAMIN WITH MINERALS) TABS Take 1 tablet by mouth daily.      Marland Kitchen olmesartan (BENICAR) 20 MG tablet Take 20 mg by mouth daily.      Marland Kitchen oxyCODONE-acetaminophen (PERCOCET/ROXICET) 5-325 MG per tablet Take 1-2 tablets by mouth every 6 (six) hours as needed for moderate pain.  90 tablet  0  . senna-docusate (SENOKOT-S) 8.6-50 MG per tablet Take 1 tablet by mouth at bedtime as needed for mild constipation.      . tamsulosin (FLOMAX) 0.4 MG CAPS capsule Take 1 capsule (0.4 mg total) by mouth daily after supper.  30 capsule  1  . traMADol (ULTRAM) 50 MG tablet Take 50 mg by mouth every 6 (six) hours as needed for pain.       Assessment: 63 yo M admitted 11/07/2013 for debridement of lumbar wound.   Pharmacy consulted to start vancomycin.  Goal of Therapy:  Vancomycin trough level 15-20 mcg/ml  Plan:  Vancomycin 1 g IV q8h. Follow up SCr, UOP, cultures, clinical course and adjust as clinically indicated.  Thank you for allowing pharmacy to be a part of this patients care team.  Rowe Robert Pharm.D., BCPS Clinical Pharmacist 11/07/2013 10:09 PM Pager: [phone] Phone: [phone]

## 2013-11-07 NOTE — Op Note (Signed)
11/07/2013  9:13 PM  PATIENT:  Peter [name]  63 y.o. male who presents with a postoperative wound infection.  PRE-OPERATIVE DIAGNOSIS:  lumbar wound infection  POST-OPERATIVE DIAGNOSIS:  lumbar wound infection  PROCEDURE:  Procedure(s): LUMBAR WOUND DEBRIDEMENT  SURGEON:  Surgeon(s): Winfield Cunas, MD  ASSISTANTS:none  ANESTHESIA:   general  EBL:  Total I/O In: 1000 [I.V.:1000] Out: -   BLOOD ADMINISTERED:none  CELL SAVER GIVEN:none  COUNT:per nursing  DRAINS: Wound vac dressing   SPECIMEN:  Source of Specimen:  wound, lumbar  DICTATION: [name] was taken to the operating room, intubated and placed under a general anesthetic. He was rolled prone onto a Wilson frame and all pressure points were padded. His back was prepped and draped in a sterile manner. I opened the incision and immediately encountered purulent drainage. I opened the thoracolumbar fascia and continued to debride the wound. I would not take the chance that this was superficial so I continued to irrigate the wound with a total of 3 litres of saline. I then placed a wound vacuum dressing in the or. The hardware was intact, there was no loosening i appreciated.   PLAN OF CARE: Admit to inpatient   PATIENT DISPOSITION:  PACU - hemodynamically stable.   Delay start of Pharmacological VTE agent (>24hrs) due to surgical blood loss or risk of bleeding:  yes

## 2013-11-07 NOTE — Anesthesia Preprocedure Evaluation (Addendum)
Anesthesia Evaluation  Patient identified by MRN, date of birth, ID band Patient awake    Reviewed: Allergy & Precautions, H&P , NPO status , Patient's Chart, lab work & pertinent test results, reviewed documented beta blocker date and time   History of Anesthesia Complications Negative for: history of anesthetic complications  Airway Mallampati: II TM Distance: >3 FB Neck ROM: Full    Dental  (+) Teeth Intact, Dental Advisory Given, Partial Upper   Pulmonary shortness of breath and with exertion, asthma , former smoker,          Cardiovascular hypertension, Pt. on medications and Pt. on home beta blockers + DOE - Cardiac Defibrillator     Neuro/Psych    GI/Hepatic negative GI ROS, Neg liver ROS,   Endo/Other  Hypothyroidism   Renal/GU negative Renal ROS  negative genitourinary   Musculoskeletal negative musculoskeletal ROS (+)   Abdominal   Peds  Hematology negative hematology ROS (+)   Anesthesia Other Findings   Reproductive/Obstetrics                        Anesthesia Physical Anesthesia Plan  ASA: III and emergent  Anesthesia Plan: General   Post-op Pain Management:    Induction: Intravenous  Airway Management Planned: Oral ETT  Additional Equipment:   Intra-op Plan:   Post-operative Plan: Extubation in OR  Informed Consent: I have reviewed the patients History and Physical, chart, labs and discussed the procedure including the risks, benefits and alternatives for the proposed anesthesia with the patient or authorized representative who has indicated his/her understanding and acceptance.   Dental advisory given  Plan Discussed with: CRNA and Surgeon  Anesthesia Plan Comments:        Anesthesia Quick Evaluation

## 2013-11-07 NOTE — Anesthesia Postprocedure Evaluation (Signed)
Anesthesia Post Note  Patient: Peter [name]  Procedure(s) Performed: Procedure(s) (LRB): LUMBAR WOUND DEBRIDEMENT (N/A)  Anesthesia type: general  Patient location: PACU  Post pain: Pain level controlled  Post assessment: Patient's Cardiovascular Status Stable  Last Vitals:  Filed Vitals:   11/07/13 2120  BP: 173/81  Pulse: 95  Temp: 37.3 C    Post vital signs: Reviewed and stable  Level of consciousness: sedated  Complications: No apparent anesthesia complications

## 2013-11-08 MED ORDER — MIDAZOLAM HCL 2 MG/2ML IJ SOLN
INTRAMUSCULAR | Status: AC
  Filled 2013-11-08: qty 2

## 2013-11-08 MED ORDER — EPHEDRINE SULFATE 50 MG/ML IJ SOLN
INTRAMUSCULAR | Status: AC
  Filled 2013-11-08: qty 1

## 2013-11-08 MED ORDER — LEVOTHYROXINE SODIUM 200 MCG PO TABS
200.0000 ug | ORAL_TABLET | Freq: Every day | ORAL | Status: DC
  Administered 2013-11-09 – 2013-11-21 (×13): 200 ug via ORAL
  Filled 2013-11-08 (×15): qty 1

## 2013-11-08 MED ORDER — PROPOFOL 10 MG/ML IV BOLUS
INTRAVENOUS | Status: AC
  Filled 2013-11-08: qty 40

## 2013-11-08 MED ORDER — GLYCOPYRROLATE 0.2 MG/ML IJ SOLN
INTRAMUSCULAR | Status: AC
  Filled 2013-11-08: qty 2

## 2013-11-08 MED ORDER — ROCURONIUM BROMIDE 50 MG/5ML IV SOLN
INTRAVENOUS | Status: AC
  Filled 2013-11-08: qty 1

## 2013-11-08 MED ORDER — SUCCINYLCHOLINE CHLORIDE 20 MG/ML IJ SOLN
INTRAMUSCULAR | Status: AC
  Filled 2013-11-08: qty 1

## 2013-11-08 MED ORDER — FENTANYL CITRATE 0.05 MG/ML IJ SOLN
INTRAMUSCULAR | Status: AC
  Filled 2013-11-08: qty 5

## 2013-11-08 MED ORDER — NEOSTIGMINE METHYLSULFATE 1 MG/ML IJ SOLN
INTRAMUSCULAR | Status: AC
  Filled 2013-11-08: qty 10

## 2013-11-08 NOTE — Progress Notes (Signed)
Pt refused to use PCA. Pt states, "At home I use percocet and that controls my pain perfectly. I can consistently rely on that to help me with my pain control." PCA removed at this time and patient is tolerating PO pain medication. Will continue to monitor. Velora Mediate

## 2013-11-08 NOTE — Evaluation (Signed)
Physical Therapy Evaluation Patient Details Name: Peter [name] MRN: [id number] DOB: August 27, 1951 Today's Date: 11/08/2013 Time: 9937-1696 PT Time Calculation (min): 33 min  PT Assessment / Plan / Recommendation History of Present Illness  Pt s/p recent L4-5 PLIF surgery now back with infection with wound vac placed at surgical incision  Clinical Impression  Pt tolerated mobility quite well for first time OOB. Anticipate pt to progress well enough to d/c home once medically stable. Would recommend a R AFO to assist with drop foot during ambulation. Acute PT to follow to progress mobility.    PT Assessment  Patient needs continued PT services    Follow Up Recommendations  Home health PT;Supervision/Assistance - 24 hour    Does the patient have the potential to tolerate intense rehabilitation      Barriers to Discharge        Equipment Recommendations  None recommended by PT    Recommendations for Other Services     Frequency Min 4X/week    Precautions / Restrictions Precautions Precautions: Fall;Back Precaution Booklet Issued: Yes (comment) Precaution Comments: Reviewed back precautions with pt. Required Braces or Orthoses:  (wound vac) Restrictions Weight Bearing Restrictions: No   Pertinent Vitals/Pain Increased pain with transfers however tolerable during ambulation, pt did not rate      Mobility  Bed Mobility Overal bed mobility: Needs Assistance Bed Mobility: Rolling;Sidelying to Sit Rolling: Min assist (due to pain) Sidelying to sit: Min assist General bed mobility comments: v/c's for technique, minA for trunk elevation due to pain Transfers Overall transfer level: Needs assistance Equipment used: Rolling walker (2 wheeled) Transfers: Sit to/from Stand Sit to Stand: Min assist General transfer comment: increased time, v/c's for hand placement Ambulation/Gait Ambulation/Gait assistance: Min guard Ambulation Distance (Feet): 200 Feet Assistive device:  Rolling walker (2 wheeled) Gait Pattern/deviations: Step-through pattern;Decreased stride length Gait velocity: guarded/cautious General Gait Details: pt con't to have trunk flexion, R drop foot at baseline, pt with good walker management    Exercises     PT Diagnosis: Difficulty walking;Acute pain  PT Problem List: Decreased strength;Decreased activity tolerance;Decreased balance;Decreased mobility;Decreased coordination;Decreased knowledge of use of DME;Decreased knowledge of precautions;Impaired sensation;Pain PT Treatment Interventions: DME instruction;Gait training;Stair training;Functional mobility training;Therapeutic activities;Therapeutic exercise;Balance training;Neuromuscular re-education;Patient/family education     PT Goals(Current goals can be found in the care plan section) Acute Rehab PT Goals Patient Stated Goal: "to get rigth" PT Goal Formulation: With patient Time For Goal Achievement: 11/22/13 Potential to Achieve Goals: Good  Visit Information  Last PT Received On: 11/08/13 Assistance Needed: +1 History of Present Illness: Pt s/p recent L4-5 PLIF surgery now back with infection with wound vac placed at surgical incision       Prior [place] expects to be discharged to:: Private residence Living Arrangements: Spouse/significant other Available Help at Discharge: Family;Available 24 hours/day Type of Home: House Home Access: Stairs to enter [employer] of Steps: 1 Entrance Stairs-Rails: None Home Layout: One level Home Equipment: Walker - 2 wheels;Shower seat;Bedside commode;Grab bars - tub/shower Prior Function Level of Independence: Needs assistance Gait / Transfers Assistance Needed: used RW PTA ADL's / Homemaking Assistance Needed: Wife A with hard to reach for bathing and dressing.   Communication Communication: No difficulties Dominant Hand: Right    Cognition  Cognition Arousal/Alertness:  Awake/alert Behavior During Therapy: WFL for tasks assessed/performed Overall Cognitive Status: Within Functional Limits for tasks assessed    Extremity/Trunk Assessment Upper Extremity Assessment Upper Extremity Assessment: Defer to OT evaluation Lower Extremity  Assessment RLE Deficits / Details: R drop foot, otherwise grossly 4/5 LLE Deficits / Details: grossly 4-/5 Cervical / Trunk Assessment Cervical / Trunk Assessment: Kyphotic   Balance    End of Session PT - End of Session Equipment Utilized During Treatment: Gait belt Activity Tolerance: Patient tolerated treatment well Patient left:  (sitting EOB to eat lunch) Nurse Communication: Mobility status  GP     Kingsley Callander 11/08/2013, 4:04 PM  Kittie Plater, PT, DPT Pager #: [phone] Office #: [phone]

## 2013-11-08 NOTE — Progress Notes (Signed)
No issues overnight. Pt reports incisional back pain, otherwise no c/o.  EXAM:  BP 127/84  Pulse 84  Temp(Src) 99.5 F (37.5 C) (Oral)  Resp 19  Ht 6\' 2"  (1.88 m)  Wt 97.16 kg (214 lb 3.2 oz)  BMI 27.49 kg/m2  SpO2 100%  Awake, alert, oriented  Speech fluent, appropriate  CN grossly intact  5/5 BUE/BLE  Wound vac in place  IMPRESSION:  63 y.o. male POD# 1 s/p I&D  PLAN: - Await cultures from OR - GS (+) for GBC - Cont Vanc - Mobilize with PT/OT

## 2013-11-08 NOTE — Progress Notes (Signed)
Pt states that pain has not been relieved on PO meds. Pt re-educated on PCA and given PCA button at 0300. O2 and CO2 monitors are turned on and working correctly. Dilaudid PCA setting confirmed with Debbora Dus, RN. Will continue to monitor pain. Elspeth Cho, RN [phone], 11/08/13

## 2013-11-08 NOTE — Progress Notes (Signed)
PT Cancellation Note  Patient Details Name: Peter [name] MRN: [id number] DOB: August 12, 1951   Cancelled Treatment:    Reason Eval/Treat Not Completed:  (Pt declined due to pain.) PT to return as able.   Kingsley Callander 11/08/2013, 9:58 AM

## 2013-11-08 NOTE — Progress Notes (Signed)
OT Cancellation Note  Patient Details Name: Cisco Kindt MRN: [id number] DOB: 03-10-1951   Cancelled Treatment:    Reason Eval/Treat Not Completed: Pain limiting ability to participate (pt also wanting nap ) Will re-attempt as time allows.   Benito Mccreedy OTR/L [phone] 11/08/2013, 9:42 AM

## 2013-11-09 NOTE — Progress Notes (Signed)
Patient ID: Peter [name], male   DOB: 1950/12/01, 63 y.o.   MRN: [id number] BP 137/67  Pulse 88  Temp(Src) 99.5 F (37.5 C) (Oral)  Resp 12  Ht 6\' 2"  (1.88 m)  Wt 97.16 kg (214 lb 3.2 oz)  BMI 27.49 kg/m2  SpO2 98% Alert and oriented x 4 Culture positive for staphylococcus Continue vancomycin Will consult id wound vac in place, packed open

## 2013-11-09 NOTE — Evaluation (Addendum)
Occupational Therapy Evaluation Patient Details Name: Peter [name] MRN: [id number] DOB: Apr 06, 1951 Today's Date: 11/09/2013 Time: 0932-3557 OT Time Calculation (min): 34 min  OT Assessment / Plan / Recommendation History of present illness Pt s/p recent L4-5 PLIF surgery now back with infection with wound vac placed at surgical incision   Clinical Impression   Pt presents with below problem list. Feel pt will benefit from acute OT to increase independence prior to d/c.     OT Assessment  Patient needs continued OT Services    Follow Up Recommendations  No OT follow up;Supervision/Assistance - 24 hour    Barriers to Discharge      Equipment Recommendations  None recommended by OT    Recommendations for Other Services    Frequency  Min 2X/week    Precautions / Restrictions Precautions Precautions: Fall;Back Precaution Booklet Issued: No Precaution Comments: Pt able to state 3/3 back precautions Restrictions Weight Bearing Restrictions: No   Pertinent Vitals/Pain Pain 3-5/10. Using PCA as needed.    ADL  Grooming: Teeth care;Set up;Supervision/safety Where Assessed - Grooming: Supported standing Upper Body Dressing: Minimal assistance Where Assessed - Upper Body Dressing: Unsupported sitting Lower Body Dressing: Moderate assistance;Maximal assistance Where Assessed - Lower Body Dressing: Supported sit to stand Toilet Transfer: Magazine features editor Method: Sit to Loss adjuster, chartered: Radiographer, therapeutic Method: Not assessed Equipment Used: Gait belt;Rolling walker Transfers/Ambulation Related to ADLs: Min guard with tactile cue to stand upright during ambulation.  ADL Comments: Discussed toilet aid for  hygiene if needed. Reviewed use of two cups for teeth care and recommended having supplies on right side of sink to avoid twisting.  Ambulated a long distance around unit.   OT Diagnosis: Acute pain  OT Problem List: Decreased  strength;Decreased coordination;Impaired balance (sitting and/or standing);Decreased activity tolerance;Decreased knowledge of use of DME or AE;Decreased knowledge of precautions;Pain OT Treatment Interventions: Self-care/ADL training;DME and/or AE instruction;Therapeutic activities;Patient/family education;Balance training   OT Goals(Current goals can be found in the care plan section) Acute Rehab OT Goals Patient Stated Goal: get better OT Goal Formulation: With patient Time For Goal Achievement: 11/16/13 Potential to Achieve Goals: Good ADL Goals Pt Will Perform Grooming: with modified independence;standing Pt Will Transfer to Toilet: with modified independence;ambulating;bedside commode Pt Will Perform Toileting - Clothing Manipulation and hygiene: with modified independence;sit to/from stand Pt Will Perform Tub/Shower Transfer: Tub transfer;with min assist;ambulating;rolling walker Additional ADL Goal #1: Pt will independently verbalize and demonstrate 3/3 back precautions during all activities.  Visit Information  Last OT Received On: 11/09/13 Assistance Needed: +1 History of Present Illness: Pt s/p recent L4-5 PLIF surgery now back with infection with wound vac placed at surgical incision       Prior [place] expects to be discharged to:: Private residence Living Arrangements: Spouse/significant other Available Help at Discharge: Family;Available 24 hours/day Type of Home: House Home Access: Stairs to enter [employer] of Steps: 1 Entrance Stairs-Rails: None Home Layout: One level Home Equipment: Walker - 2 wheels;Shower seat;Bedside commode;Grab bars - tub/shower Prior Function Level of Independence: Needs assistance Gait / Transfers Assistance Needed: used RW PTA ADL's / Homemaking Assistance Needed: Wife A with hard to reach for bathing and dressing.  Assistance with tub transfer. Communication Communication: No  difficulties Dominant Hand: Right         Vision/Perception Vision - History Baseline Vision: Wears glasses all the time   Cognition  Cognition Arousal/Alertness: Awake/alert Behavior During Therapy: [facility] for  tasks assessed/performed Overall Cognitive Status: Within Functional Limits for tasks assessed    Extremity/Trunk Assessment Upper Extremity Assessment Upper Extremity Assessment: RUE deficits/detail;LUE deficits/detail RUE Coordination: decreased fine motor LUE Coordination: decreased fine motor Lower Extremity Assessment Lower Extremity Assessment: Defer to PT evaluation Cervical / Trunk Assessment Cervical / Trunk Assessment: Kyphotic     Mobility Bed Mobility Overal bed mobility: Needs Assistance Bed Mobility: Rolling;Sidelying to Sit Rolling: Supervision Sidelying to sit: Min guard General bed mobility comments: cues to reinforce technique and explained that pt can use mattress at home to assist-pt using rail today. Transfers Overall transfer level: Needs assistance Equipment used: Rolling walker (2 wheeled) Transfers: Sit to/from Stand Sit to Stand: Min guard General transfer comment: cues for technique.     Exercise     Balance     End of Session OT - End of Session Equipment Utilized During Treatment: Gait belt;Rolling walker Activity Tolerance: Patient tolerated treatment well Patient left: in bed;with call bell/phone within reach;with bed alarm set;Other (comment) (with nurse tech) Nurse Communication:  (came in to look at wound)  [place], [place] OTR/L [phone] 11/09/2013, 10:57 AM

## 2013-11-10 ENCOUNTER — Encounter: Payer: 59 | Admitting: Occupational Therapy

## 2013-11-10 LAB — VANCOMYCIN, TROUGH: Vancomycin Tr: 19 ug/mL (ref 10.0–20.0)

## 2013-11-10 MED ORDER — POLYETHYLENE GLYCOL 3350 17 G PO PACK
17.0000 g | PACK | Freq: Every day | ORAL | Status: DC
  Administered 2013-11-10 – 2013-11-20 (×9): 17 g via ORAL
  Filled 2013-11-10 (×12): qty 1

## 2013-11-10 NOTE — Progress Notes (Signed)
Physical Therapy Treatment Patient Details Name: Peter [name] MRN: [id number] DOB: 1951-05-02 Today's Date: 11/10/2013 Time: 4098-1191 PT Time Calculation (min): 32 min  PT Assessment / Plan / Recommendation  History of Present Illness Pt s/p recent L4-5 PLIF surgery now back with infection with wound vac placed at surgical incision   PT Comments   Pt well known to this PT. Mobility not far from last D/C.  RN made aware of air leak on wound vac dressing.  Will continue to follow.    Follow Up Recommendations  Home health PT;Supervision/Assistance - 24 hour     Does the patient have the potential to tolerate intense rehabilitation     Barriers to Discharge        Equipment Recommendations  None recommended by PT    Recommendations for Other Services    Frequency Min 4X/week   Progress towards PT Goals Progress towards PT goals: Progressing toward goals  Plan Current plan remains appropriate    Precautions / Restrictions Precautions Precautions: Fall;Back Precaution Comments: Pt able to state 3/3 back precautions Restrictions Weight Bearing Restrictions: No   Pertinent Vitals/Pain "Terrible today"      Mobility  Bed Mobility Overal bed mobility: Needs Assistance Bed Mobility: Rolling;Sidelying to Sit Rolling: Supervision Sidelying to sit: Min assist General bed mobility comments: pt able to roll to R side, but not left due to pain.  Needed A bringing trunk up to sitting.   Transfers Overall transfer level: Needs assistance Equipment used: Rolling walker (2 wheeled) Transfers: Sit to/from Stand Sit to Stand: Min assist;From elevated surface General transfer comment: Demos good technique.   Ambulation/Gait Ambulation/Gait assistance: Min guard Ambulation Distance (Feet): 500 Feet Assistive device: Rolling walker (2 wheeled) Gait Pattern/deviations: Step-through pattern;Decreased stride length;Trunk flexed General Gait Details: pt continues to need cues for  upright posture and to decrease reliance on RW.  pt moves very slowly and labored.      Exercises     PT Diagnosis:    PT Problem List:   PT Treatment Interventions:     PT Goals (current goals can now be found in the care plan section) Acute Rehab PT Goals Patient Stated Goal: get better Time For Goal Achievement: 11/22/13 Potential to Achieve Goals: Good  Visit Information  Last PT Received On: 11/10/13 Assistance Needed: +1 History of Present Illness: Pt s/p recent L4-5 PLIF surgery now back with infection with wound vac placed at surgical incision    Subjective Data  Patient Stated Goal: get better   Cognition  Cognition Arousal/Alertness: Awake/alert Behavior During Therapy: WFL for tasks assessed/performed Overall Cognitive Status: Within Functional Limits for tasks assessed    Balance     End of Session PT - End of Session Equipment Utilized During Treatment: Gait belt Activity Tolerance: Patient tolerated treatment well Patient left: in bed;with call bell/phone within reach (sitting EOB with lunch) Nurse Communication: Mobility status   GP     Catarina Hartshorn, [place] 11/10/2013, 3:07 PM

## 2013-11-10 NOTE — Progress Notes (Signed)
Pt wound vac dsg and cannister changed. 57ml output for shift.

## 2013-11-10 NOTE — Progress Notes (Signed)
PT Cancellation Note  Patient Details Name: Peter [name] MRN: [id number] DOB: 1951-05-04   Cancelled Treatment:    Reason Eval/Treat Not Completed: Pain limiting ability to participate.  Will try back this pm.     Shadee Montoya F 11/10/2013, 9:57 AM

## 2013-11-10 NOTE — Progress Notes (Signed)
   CARE MANAGEMENT NOTE 11/10/2013  Patient:  Peter [name], Peter [name]   Account Number:  [id number]  Date Initiated:  11/10/2013  Documentation initiated by:  Olga Coaster  Subjective/Objective Assessment:   ADITTED WITH LUMBAR WOUND INFECTION     Action/Plan:   CM FOLOWING FOR DCP; PATIENT IS ACTIVE WITH [facility] AS PRIOR TO ADMISSION   Anticipated DC Date:  11/15/2013   Anticipated DC Plan:  [facility]  CM consult       Status of service:  In process, will continue to follow Medicare Important Message given?   (If response is "NO", the following Medicare IM given date fields will be blank)  Per UR Regulation:  Reviewed for med. necessity/level of care/duration of stay  Comments:  2/16/2015Mindi Slicker RN,BSN,MHA [phone]

## 2013-11-10 NOTE — Progress Notes (Signed)
Patient ID: Peter [name], male   DOB: June 22, 1951, 63 y.o.   MRN: [id number] BP 146/68  Pulse 85  Temp(Src) 98.2 F (36.8 C) (Oral)  Resp 16  Ht 6\' 2"  (1.88 m)  Wt 97.16 kg (214 lb 3.2 oz)  BMI 27.49 kg/m2  SpO2 96% Alert and oriented x 4, speech is clear and fluent Moving all extremities well Wound vac in place, draining well Staph aureus is the organism. Continue with vancomycin

## 2013-11-11 LAB — ANAEROBIC CULTURE

## 2013-11-11 LAB — WOUND CULTURE

## 2013-11-11 NOTE — Progress Notes (Signed)
PT Cancellation Note  Patient Details Name: Peter [name] MRN: [id number] DOB: Aug 01, 1951   Cancelled Treatment:    Reason Eval/Treat Not Completed: Fatigue/lethargy limiting ability to participate;Pain limiting ability to participate   Marti Mclane, Thornton Papas 11/11/2013, 2:42 PM

## 2013-11-11 NOTE — Progress Notes (Signed)
Occupational Therapy Treatment Patient Details Name: Peter [name] MRN: [id number] DOB: 02/17/1951 Today's Date: 11/11/2013 Time: 2993-7169 OT Time Calculation (min): 34 min  OT Assessment / Plan / Recommendation  History of present illness Pt s/p recent L4-5 PLIF surgery now back with infection with wound vac placed at surgical incision   OT comments  Agreeable to oob and requests amb. To sink for grooming tasks.  Following back precautions with out cues and eager for P.T. Session after lunch. States pain is better managed with pump and not as painful as yesterday.    Follow Up Recommendations  No OT follow up;Supervision/Assistance - 24 hour           Equipment Recommendations  None recommended by OT        Frequency Min 2X/week   Progress towards OT Goals Progress towards OT goals: Progressing toward goals  Plan Discharge plan remains appropriate    Precautions / Restrictions Precautions Precautions: Fall;Back Precaution Comments: Pt able to state 3/3 back precautions   Pertinent Vitals/Pain No c/o, using pca pump prn    ADL  Grooming: Performed;Teeth care;Min guard Where Assessed - Grooming: Supported standing Toilet Transfer: Simulated;Min Psychiatric nurse Method: Sit to stand Toileting - Water quality scientist and Hygiene: Simulated;Min guard Where Assessed - Toileting Clothing Manipulation and Hygiene: Standing ADL Comments: pt. requested oob for oral care at sink.  mentioned independently maintaining back precautions without cues during functional tasks.  amb. well, staying inside the walker during amb. back to recliner       OT Goals(current goals can now be found in the care plan section)    Visit Information  Last OT Received On: 11/11/13 History of Present Illness: Pt s/p recent L4-5 PLIF surgery now back with infection with wound vac placed at surgical incision    Subjective Data   "i have psychological goals for myself, I need to get up and  move around it makes me feel better"         Cognition  Cognition Arousal/Alertness: Awake/alert Behavior During Therapy: WFL for tasks assessed/performed Overall Cognitive Status: Within Functional Limits for tasks assessed    Mobility  Bed Mobility Overal bed mobility: Needs Assistance General bed mobility comments: able to roll to side but requires A to bring trunk upright into sitting Transfers Overall transfer level: Needs assistance Equipment used: Rolling walker (2 wheeled) Sit to Stand: Min assist              End of Session OT - End of Session Equipment Utilized During Treatment: Rolling walker Activity Tolerance: Patient tolerated treatment well Patient left: in chair;with call bell/phone within reach       Janice Coffin, COTA/L 11/11/2013, 12:34 PM

## 2013-11-11 NOTE — Progress Notes (Signed)
ANTIBIOTIC CONSULT NOTE - FOLLOW UP  Pharmacy Consult for Vancomycin Indication: lumbar wound  Allergies  Allergen Reactions  . Keflex [Cephalexin] Anaphylaxis    Patient Measurements: Height: 6\' 2"  (188 cm) Weight: 214 lb 3.2 oz (97.16 kg) IBW/kg (Calculated) : 82.2  Vital Signs: Temp: 98.8 F (37.1 C) (02/17 1100) Temp src: Oral (02/17 1100) BP: 114/74 mmHg (02/17 1100) Pulse Rate: 77 (02/17 1100) Intake/Output from previous day: 02/16 0701 - 02/17 0700 In: 480 [P.O.:480] Out: 4000 [Urine:3900; Drains:100] Intake/Output from this shift: Total I/O In: -  Out: 1700 [Urine:1700]  Labs: No results found for this basename: WBC, HGB, PLT, LABCREA, CREATININE,  in the last 72 hours Estimated Creatinine Clearance: 108.6 ml/min (by C-G formula based on Cr of 0.82).  Recent Labs  11/10/13 1515  [place] 19.0     Microbiology: Recent Results (from the past 720 hour(s))  SURGICAL PCR SCREEN     Status: None   Collection Time    10/17/13 12:59 PM      Result Value Ref Range Status   MRSA, PCR NEGATIVE  NEGATIVE Final   Staphylococcus aureus NEGATIVE  NEGATIVE Final   Comment:            The Xpert SA Assay (FDA     approved for NASAL specimens     in patients over 58 years of age),     is one component of     a comprehensive surveillance     program.  Test performance has     been validated by [employer] for patients greater     than or equal to 24 year old.     It is not intended     to diagnose infection nor to     guide or monitor treatment.  GRAM STAIN     Status: None   Collection Time    11/07/13  8:46 PM      Result Value Ref Range Status   Specimen Description WOUND BACK   Final   Special Requests     Final   Value: PATIENT ON FOLLOWING VANCOMYCIN, CIPROFLAXIN LUMBAR WOUND   Gram Stain     Final   Value: ABUNDANT WBC PRESENT,BOTH PMN AND MONONUCLEAR     MODERATE GRAM POSITIVE COCCI IN PAIRS   Report Status 11/07/2013 FINAL   Final  WOUND  CULTURE     Status: None   Collection Time    11/07/13  8:46 PM      Result Value Ref Range Status   Specimen Description WOUND BACK   Final   Special Requests     Final   Value: PATIENT ON FOLLOWING VANCOMYCIN, CIPROFLAXIN LUMBAR WOUND   Gram Stain     Final   Value: ABUNDANT WBC PRESENT,BOTH PMN AND MONONUCLEAR     NO SQUAMOUS EPITHELIAL CELLS SEEN     MODERATE GRAM POSITIVE COCCI IN PAIRS     Performed at [facility]   Culture     Final   Value: ABUNDANT STAPHYLOCOCCUS AUREUS     Note: RIFAMPIN AND GENTAMICIN SHOULD NOT BE USED AS SINGLE DRUGS FOR TREATMENT OF STAPH INFECTIONS. This organism DOES NOT demonstrate inducible Clindamycin resistance in vitro.     Performed at [facility]   Report Status 11/11/2013 FINAL   Final   Organism ID, Bacteria STAPHYLOCOCCUS AUREUS   Final  ANAEROBIC CULTURE     Status: None   Collection Time    11/07/13  8:46 PM      Result Value Ref Range Status   Specimen Description WOUND BACK   Final   Special Requests     Final   Value: PATIENT ON FOLLOWING VANCOMYCIN, CIPROFLAXIN LUMBAR WOUND   Gram Stain     Final   Value: ABUNDANT WBC PRESENT,BOTH PMN AND MONONUCLEAR     NO SQUAMOUS EPITHELIAL CELLS SEEN     MODERATE GRAM POSITIVE COCCI IN PAIRS     Performed at [facility]     Performed at [facility]   Culture     Final   Value: NO ANAEROBES ISOLATED     Performed at [facility]   Report Status 11/11/2013 FINAL   Final    Anti-infectives   Start     Dose/Rate Route Frequency Ordered Stop   11/07/13 2300  vancomycin (VANCOCIN) IVPB 1000 mg/200 mL premix     1,000 mg 200 mL/hr over 60 Minutes Intravenous Every 8 hours 11/07/13 2213        Assessment:    POD # 4 I&D of lumbar wound. Culture grew MSSA. Wound VAC in place. Afebrile.   Vancomycin trough level done on 2/16 = 19.0 mcg/ml. At goal.  Noted ID consult planned.  Goal of Therapy:  Vancomycin trough level 15-20 mcg/ml  Plan:    Continue Vancomycin 1 gram IV q8hrs.  Will check CBC and bmet in am.  Will follow up antibiotic plans.  Arty Baumgartner, [place] Pager: [phone] 11/11/2013,1:16 PM

## 2013-11-11 NOTE — Progress Notes (Signed)
Patient ID: Peter [name], male   DOB: November 22, 1950, 63 y.o.   MRN: [id number] BP 114/74  Pulse 77  Temp(Src) 98.8 F (37.1 C) (Oral)  Resp 13  Ht 6\' 2"  (1.88 m)  Wt 97.16 kg (214 lb 3.2 oz)  BMI 27.49 kg/m2  SpO2 94% aLert and oriented x 4, afebrile Moving all extremities well Wound vac in place. Continue vancomycin until new recommendations come in from ID

## 2013-11-11 NOTE — Clinical Social Work Note (Signed)
CSW received consult for possible SNF placement at time of discharge. PT recommending home with home health services, with no PT follow-up. CSW signing off. Please re consult if needed. ° °Emily [place], LCSWA °Clinical Social Worker °[phone] °

## 2013-11-12 ENCOUNTER — Encounter: Payer: 59 | Admitting: Occupational Therapy

## 2013-11-12 DIAGNOSIS — T8140XA Infection following a procedure, unspecified, initial encounter: Principal | ICD-10-CM

## 2013-11-12 DIAGNOSIS — D649 Anemia, unspecified: Secondary | ICD-10-CM | POA: Diagnosis present

## 2013-11-12 DIAGNOSIS — A4901 Methicillin susceptible Staphylococcus aureus infection, unspecified site: Secondary | ICD-10-CM

## 2013-11-12 DIAGNOSIS — Y849 Medical procedure, unspecified as the cause of abnormal reaction of the patient, or of later complication, without mention of misadventure at the time of the procedure: Secondary | ICD-10-CM

## 2013-11-12 DIAGNOSIS — E039 Hypothyroidism, unspecified: Secondary | ICD-10-CM | POA: Diagnosis present

## 2013-11-12 LAB — BASIC METABOLIC PANEL
BUN: 8 mg/dL (ref 6–23)
CHLORIDE: 96 meq/L (ref 96–112)
CO2: 27 mEq/L (ref 19–32)
CREATININE: 0.81 mg/dL (ref 0.50–1.35)
Calcium: 9.8 mg/dL (ref 8.4–10.5)
GFR calc Af Amer: >90 mL/min (ref >90)
GFR calc non Af Amer: >90 mL/min (ref >90)
Glucose, Bld: 90 mg/dL (ref 70–99)
POTASSIUM: 4.6 meq/L (ref 3.7–5.3)
Sodium: 132 mEq/L — ABNORMAL LOW (ref 137–147)

## 2013-11-12 LAB — CBC
HCT: 28.9 % — ABNORMAL LOW (ref 39.0–52.0)
HEMOGLOBIN: 9.7 g/dL — AB (ref 13.0–17.0)
MCH: 27.2 pg (ref 26.0–34.0)
MCHC: 33.6 g/dL (ref 30.0–36.0)
MCV: 81.2 fL (ref 78.0–100.0)
Platelets: 601 10*3/uL — ABNORMAL HIGH (ref 150–400)
RBC: 3.56 MIL/uL — ABNORMAL LOW (ref 4.22–5.81)
RDW: 13.7 % (ref 11.5–15.5)
WBC: 6.2 10*3/uL (ref 4.0–10.5)

## 2013-11-12 LAB — SEDIMENTATION RATE: Sed Rate: 86 mm/hr — ABNORMAL HIGH (ref 0–16)

## 2013-11-12 LAB — C-REACTIVE PROTEIN: CRP: 3.6 mg/dL — ABNORMAL HIGH (ref <0.60)

## 2013-11-12 MED ORDER — SODIUM CHLORIDE 0.9 % IJ SOLN
10.0000 mL | INTRAMUSCULAR | Status: DC | PRN
  Administered 2013-11-14 – 2013-11-21 (×3): 10 mL

## 2013-11-12 MED ORDER — RIFAMPIN 300 MG PO CAPS
600.0000 mg | ORAL_CAPSULE | Freq: Every day | ORAL | Status: DC
  Administered 2013-11-12 – 2013-11-21 (×10): 600 mg via ORAL
  Filled 2013-11-12 (×10): qty 2

## 2013-11-12 NOTE — Progress Notes (Signed)
Pt was placed on contact precaution for wound culture results on 2/16.  Culture positive for Staph Aureus; Contact precaution not needed per MD. Contact precaution Discontinued.

## 2013-11-12 NOTE — Progress Notes (Signed)
Patient ID: Peter [name], male   DOB: 10-25-1950, 63 y.o.   MRN: [id number] BP 135/81  Pulse 69  Temp(Src) 98.2 F (36.8 C) (Oral)  Resp 18  Ht 6\' 2"  (1.88 m)  Wt 97.16 kg (214 lb 3.2 oz)  BMI 27.49 kg/m2  SpO2 100% Alert and oriented  Moving lower extremities well Appreciate ID consult from Dr. Megan Salon today. Will continue vancomycin, and rifampin will be initiated Still using the pca extensively, not quite ready for discharge.

## 2013-11-12 NOTE — Progress Notes (Signed)
Talked to patient about DCP;  Independent prior to admission, continues to work; Patient plans to return home at discharge; has good family support - spouse and daughters; patient is active with [facility] as prior to admission; Aneta Mins [phone]

## 2013-11-12 NOTE — Progress Notes (Signed)
Peripherally Inserted Central Catheter/Midline Placement  The IV Nurse has discussed with the patient and/or persons authorized to consent for the patient, the purpose of this procedure and the potential benefits and risks involved with this procedure.  The benefits include less needle sticks, lab draws from the catheter and patient may be discharged home with the catheter.  Risks include, but not limited to, infection, bleeding, blood clot (thrombus formation), and puncture of an artery; nerve damage and irregular heat beat.  Alternatives to this procedure were also discussed.  PICC/Midline Placement Documentation        Peter [name] 11/12/2013, 6:26 PM

## 2013-11-12 NOTE — Progress Notes (Signed)
Wound vac dressing and canister changed, 250 cc bright red drainage noted in canister.  Denver Faster

## 2013-11-12 NOTE — Consult Note (Addendum)
[facility] for Infectious Disease    Date of Admission:  11/07/2013           Day 6 vancomycin       Reason for Consult: Methicillin sensitive staph aureus lumbar wound infection    Referring Physician: Dr. Dayton Bailiff  Principal Problem:   Wound infection after surgery Active Problems:   Acute combined systolic and diastolic heart failure   HTN (hypertension)   Spondylolisthesis of lumbar region   Hypothyroid   Normocytic anemia   . aspirin EC  81 mg Oral BID  . B-complex with vitamin C  1 tablet Oral Daily  . carvedilol  12.5 mg Oral BID WC  . digoxin  0.25 mg Oral Daily  . furosemide  20 mg Oral Daily  . HYDROmorphone PCA 0.3 mg/mL   Intravenous 6 times per day  . irbesartan  150 mg Oral Daily  . isosorbide-hydrALAZINE  1 tablet Oral BID  . levothyroxine  200 mcg Oral QAC breakfast  . multivitamin with minerals  1 tablet Oral Daily  . polyethylene glycol  17 g Oral Daily  . senna  1 tablet Oral BID  . sodium chloride  3 mL Intravenous Q12H  . tamsulosin  0.4 mg Oral QPC supper  . vancomycin  1,000 mg Intravenous Q8H    Recommendations: 1. Continue vancomycin along with oral rifampin through at least through March 28 2. PICC placement 3. Sedimentation rate and C-reactive protein   Assessment: He has postoperative MSSA lumbar wound infection. IV cefazolin would be more active than vancomycin against MSSA but he has a history of anaphylaxis after taking oral cephalexin 20 years ago. It sounds like he had full-blown Stevens-Johnson syndrome. He says that he has tolerated ampicillin and penicillin since that time but I do not want to run the risk of a serious adverse reaction so I will continue vancomycin. I will add rifampin because of the presence of fixation hardware at the site of infection. He will need a minimum of 6 weeks of therapy.   HPI: Peter [name] is a 63 y.o. male with spondylolisthesis and neurogenic claudication who underwent lumbar  decompression and fusion on January 28. He developed some postoperative wound drainage that became increasingly purulent. This was associated with high fever leading to readmission 6 days ago. He underwent incision and drainage and operative cultures have grown methicillin sensitive staph aureus. There was concern that the infection might be below the fascia to the level of the spine and hardware. He now has a VAC dressing in place and it is beginning to feel slightly better. He has defervesced.   Review of Systems: Pertinent items are noted in HPI.  Past Medical History  Diagnosis Date  . Allergy   . Asthma   . Hypertension   . Substance abuse   . Acute combined systolic and diastolic heart failure 6/96/2952  . Shortness of breath     with asthma  . Arthritis   . Cardiomyopathy   . Automatic implantable cardioverter-defibrillator in situ     portable defibrilator  . Complication of anesthesia     problems urinating after anesthesia  . Hypothyroidism     History  Substance Use Topics  . Smoking status: Former Smoker -- 20 years    Quit date: 08/14/1995  . Smokeless tobacco: Never Used  . Alcohol Use: No    History reviewed. No pertinent family history. Allergies  Allergen Reactions  . Keflex [  Cephalexin] Anaphylaxis    OBJECTIVE: Blood pressure 115/66, pulse 70, temperature 98.1 F (36.7 C), temperature source Oral, resp. rate 18, height 6\' 2"  (1.88 m), weight 97.16 kg (214 lb 3.2 oz), SpO2 96.00%. General: He is alert and conversant Skin: No rash Lungs: Clear Cor: Regular S1 and S2 with no murmurs Abdomen: Soft and nontender Vac dressing in lumbar wound  Lab Results Lab Results  Component Value Date   WBC 6.2 11/12/2013   HGB 9.7* 11/12/2013   HCT 28.9* 11/12/2013   MCV 81.2 11/12/2013   PLT 601* 11/12/2013    Lab Results  Component Value Date   CREATININE 0.81 11/12/2013   BUN 8 11/12/2013   NA 132* 11/12/2013   K 4.6 11/12/2013   CL 96 11/12/2013   CO2 27  11/12/2013    Lab Results  Component Value Date   ALT 15 08/13/2013   AST 18 08/13/2013   ALKPHOS 99 08/13/2013   BILITOT 0.3 08/13/2013     Microbiology: Recent Results (from the past 240 hour(s))  GRAM STAIN     Status: None   Collection Time    11/07/13  8:46 PM      Result Value Ref Range Status   Specimen Description WOUND BACK   Final   Special Requests     Final   Value: PATIENT ON FOLLOWING VANCOMYCIN, CIPROFLAXIN LUMBAR WOUND   Gram Stain     Final   Value: ABUNDANT WBC PRESENT,BOTH PMN AND MONONUCLEAR     MODERATE GRAM POSITIVE COCCI IN PAIRS   Report Status 11/07/2013 FINAL   Final  WOUND CULTURE     Status: None   Collection Time    11/07/13  8:46 PM      Result Value Ref Range Status   Specimen Description WOUND BACK   Final   Special Requests     Final   Value: PATIENT ON FOLLOWING VANCOMYCIN, CIPROFLAXIN LUMBAR WOUND   Gram Stain     Final   Value: ABUNDANT WBC PRESENT,BOTH PMN AND MONONUCLEAR     NO SQUAMOUS EPITHELIAL CELLS SEEN     MODERATE GRAM POSITIVE COCCI IN PAIRS     Performed at [facility]   Culture     Final   Value: ABUNDANT STAPHYLOCOCCUS AUREUS     Note: RIFAMPIN AND GENTAMICIN SHOULD NOT BE USED AS SINGLE DRUGS FOR TREATMENT OF STAPH INFECTIONS. This organism DOES NOT demonstrate inducible Clindamycin resistance in vitro.     Performed at [facility]   Report Status 11/11/2013 FINAL   Final   Organism ID, Bacteria STAPHYLOCOCCUS AUREUS   Final  ANAEROBIC CULTURE     Status: None   Collection Time    11/07/13  8:46 PM      Result Value Ref Range Status   Specimen Description WOUND BACK   Final   Special Requests     Final   Value: PATIENT ON FOLLOWING VANCOMYCIN, CIPROFLAXIN LUMBAR WOUND   Gram Stain     Final   Value: ABUNDANT WBC PRESENT,BOTH PMN AND MONONUCLEAR     NO SQUAMOUS EPITHELIAL CELLS SEEN     MODERATE GRAM POSITIVE COCCI IN PAIRS     Performed at [facility]     Performed at [facility]     Culture     Final   Value: NO ANAEROBES ISOLATED     Performed at [facility]   Report Status 11/11/2013 FINAL   Final  Michel Bickers, MD [facility] [phone] pager   [phone] cell 11/12/2013, 3:50 PM

## 2013-11-13 NOTE — Progress Notes (Signed)
Patient ID: Peter [name], male   DOB: [birth year], 63 y.o.   MRN: [id number] BP 117/67  Pulse 70  Temp(Src) 99 F (37.2 C) (Oral)  Resp 16  Ht 6\' 2"  (1.88 m)  Wt 97.16 kg (214 lb 3.2 oz)  BMI 27.49 kg/m2  SpO2 97% Alert and oriented x 4 Moving all extremities Wound vac in place Will discontinue pca tomorrow, switch exclusively to oral meds. Continue antibiotics

## 2013-11-13 NOTE — Progress Notes (Signed)
[facility]  Patient Status: Active pt with [facility] up to this admission  [facility] is providing the following services: [place] PT and OT. Pt will also have home infusion pharmacy services upon DC back home for home IV ABX as ordered by MD.  [facility] will provide in hospital DC teaching regarding IV ABX for independence at home upon DC.   [facility] hospital team will support transition home when deemed appropriate by MD.   If patient discharges after hours, please call [phone].   Larry Sierras 11/13/2013, 1:49 PM

## 2013-11-13 NOTE — Progress Notes (Signed)
Physical Therapy Note   11/12/13 1100  PT Visit Information  Last PT Received On 11/13/13  Assistance Needed +1  History of Present Illness Pt s/p recent L4-5 PLIF surgery now back with infection with wound vac placed at surgical incision  PT Time Calculation  PT Start Time 1038  PT Stop Time 1105  PT Time Calculation (min) 27 min  Subjective Data  Patient Stated Goal get better  Precautions  Precautions Fall;Back  Precaution Comments Pt able to state 3/3 back precautions  Restrictions  Weight Bearing Restrictions No  Cognition  Arousal/Alertness Awake/alert  Behavior During Therapy WFL for tasks assessed/performed  Overall Cognitive Status Within Functional Limits for tasks assessed  Bed Mobility  Overal bed mobility Needs Assistance  Bed Mobility Rolling;Sidelying to Sit  Rolling Supervision  Sidelying to sit Supervision  General bed mobility comments Improved bed mobility today.    Transfers  Overall transfer level Needs assistance  Equipment used Rolling walker (2 wheeled)  Transfers Sit to/from Stand  Sit to Stand Min guard  General transfer comment Demos good technique.    Ambulation/Gait  Ambulation/Gait assistance Supervision  Ambulation Distance (Feet) 500 Feet  Assistive device Rolling walker (2 wheeled)  Gait Pattern/deviations Step-through pattern;Decreased stride length;Decreased dorsiflexion - right;Decreased dorsiflexion - left;Trunk flexed  General Gait Details pt with improved posture today and needed decreased cueing about trunk flexion.    PT - End of Session  Equipment Utilized During Treatment Gait belt  Activity Tolerance Patient tolerated treatment well  Patient left in bed;with call bell/phone within reach  Nurse Communication Mobility status  PT - Assessment/Plan  PT Plan Current plan remains appropriate  PT Frequency Min 4X/week  Follow Up Recommendations Home health PT;Supervision/Assistance - 24 hour  PT equipment None recommended by PT   PT Goal Progression  Progress towards PT goals Progressing toward goals  Acute Rehab PT Goals  Time For Goal Achievement 11/22/13  Potential to Achieve Goals Good  PT General Charges  $$ ACUTE PT VISIT 1 Procedure  PT Treatments  $Gait Training 23-37 mins   [place], [place] [phone]

## 2013-11-13 NOTE — Progress Notes (Signed)
Physical Therapy Treatment Patient Details Name: Peter [name] MRN: [id number] DOB: 1951-01-08 Today's Date: 11/13/2013 Time: 8416-6063 PT Time Calculation (min): 31 min  PT Assessment / Plan / Recommendation  History of Present Illness Pt s/p recent L4-5 PLIF surgery now back with infection with wound vac placed at surgical incision   PT Comments   Pt continues to improve mobility.  Will try step tomorrow in preparation of D/C to home with wife.  Will continue to follow.    Follow Up Recommendations  Home health PT;Supervision/Assistance - 24 hour     Does the patient have the potential to tolerate intense rehabilitation     Barriers to Discharge        Equipment Recommendations  None recommended by PT    Recommendations for Other Services    Frequency Min 4X/week   Progress towards PT Goals Progress towards PT goals: Progressing toward goals  Plan Current plan remains appropriate    Precautions / Restrictions Precautions Precautions: Fall;Back Precaution Comments: Pt able to state 3/3 back precautions Restrictions Weight Bearing Restrictions: No   Pertinent Vitals/Pain 3-5/10 throughout mobility.  Premedicated.      Mobility  Bed Mobility Overal bed mobility: Modified Independent Transfers Overall transfer level: Needs assistance Equipment used: Rolling walker (2 wheeled) Transfers: Sit to/from Stand Sit to Stand: Min guard General transfer comment: Demos good technique.   Ambulation/Gait Ambulation/Gait assistance: Supervision Ambulation Distance (Feet): 1000 Feet Assistive device: Rolling walker (2 wheeled) Gait Pattern/deviations: Step-through pattern;Decreased stride length;Trunk flexed;Decreased dorsiflexion - right;Decreased dorsiflexion - left General Gait Details: pt continues with improved posture today and wanted to double his ambulation distance.      Exercises     PT Diagnosis:    PT Problem List:   PT Treatment Interventions:     PT Goals  (current goals can now be found in the care plan section) Acute Rehab PT Goals Patient Stated Goal: get better Time For Goal Achievement: 11/22/13 Potential to Achieve Goals: Good  Visit Information  Last PT Received On: 11/13/13 Assistance Needed: +1 History of Present Illness: Pt s/p recent L4-5 PLIF surgery now back with infection with wound vac placed at surgical incision    Subjective Data  Patient Stated Goal: get better   Cognition  Cognition Arousal/Alertness: Awake/alert Behavior During Therapy: WFL for tasks assessed/performed Overall Cognitive Status: Within Functional Limits for tasks assessed    Balance     End of Session PT - End of Session Equipment Utilized During Treatment: Gait belt Activity Tolerance: Patient tolerated treatment well Patient left: in bed;with nursing/sitter in room (Sitting EOB with Nsg Tech for bath) Nurse Communication: Mobility status   GP     [place] Papas, [place] 11/13/2013, 12:48 PM

## 2013-11-13 NOTE — Progress Notes (Signed)
Patient ID: Peter [name], male   DOB: 05/01/1951, 63 y.o.   MRN: [id number]         [facility] for Infectious Disease    Date of Admission:  11/07/2013   Total days of antibiotics 7         Principal Problem:   Wound infection after surgery Active Problems:   Acute combined systolic and diastolic heart failure   HTN (hypertension)   Spondylolisthesis of lumbar region   Hypothyroid   Normocytic anemia   . aspirin EC  81 mg Oral BID  . B-complex with vitamin C  1 tablet Oral Daily  . carvedilol  12.5 mg Oral BID WC  . digoxin  0.25 mg Oral Daily  . furosemide  20 mg Oral Daily  . HYDROmorphone PCA 0.3 mg/mL   Intravenous 6 times per day  . irbesartan  150 mg Oral Daily  . isosorbide-hydrALAZINE  1 tablet Oral BID  . levothyroxine  200 mcg Oral QAC breakfast  . multivitamin with minerals  1 tablet Oral Daily  . polyethylene glycol  17 g Oral Daily  . rifampin  600 mg Oral Daily  . senna  1 tablet Oral BID  . sodium chloride  3 mL Intravenous Q12H  . tamsulosin  0.4 mg Oral QPC supper  . vancomycin  1,000 mg Intravenous Q8H    Subjective: He is feeling much better. He just finished walking in the hall with the physical therapist.  Past Medical History  Diagnosis Date  . Allergy   . Asthma   . Hypertension   . Substance abuse   . Acute combined systolic and diastolic heart failure 02/24/931  . Shortness of breath     with asthma  . Arthritis   . Cardiomyopathy   . Automatic implantable cardioverter-defibrillator in situ     portable defibrilator  . Complication of anesthesia     problems urinating after anesthesia  . Hypothyroidism     History  Substance Use Topics  . Smoking status: Former Smoker -- 20 years    Quit date: 08/14/1995  . Smokeless tobacco: Never Used  . Alcohol Use: No    History reviewed. No pertinent family history.  Allergies  Allergen Reactions  . Keflex [Cephalexin] Anaphylaxis    Objective: Temp:  [98 F (36.7  C)-99.2 F (37.3 C)] 98.5 F (36.9 C) (02/19 0923) Pulse Rate:  [63-85] 85 (02/19 0923) Resp:  [15-25] 25 (02/19 0923) BP: (101-148)/(60-87) 131/71 mmHg (02/19 0923) SpO2:  [95 %-100 %] 95 % (02/19 0923)  General: He is more alert. Skin: New right arm PICC in place Lungs: Clear Cor: Regular S1 and S2 no murmurs VAC dressing in place in lumbar wound  Lab Results Lab Results  Component Value Date   WBC 6.2 11/12/2013   HGB 9.7* 11/12/2013   HCT 28.9* 11/12/2013   MCV 81.2 11/12/2013   PLT 601* 11/12/2013    Lab Results  Component Value Date   CREATININE 0.81 11/12/2013   BUN 8 11/12/2013   NA 132* 11/12/2013   K 4.6 11/12/2013   CL 96 11/12/2013   CO2 27 11/12/2013    Lab Results  Component Value Date   ALT 15 08/13/2013   AST 18 08/13/2013   ALKPHOS 99 08/13/2013   BILITOT 0.3 08/13/2013   Lab Results  Component Value Date   CRP 3.6* 11/12/2013   Lab Results  Component Value Date   ESRSEDRATE 86* 11/12/2013      Microbiology: Recent  Results (from the past 240 hour(s))  GRAM STAIN     Status: None   Collection Time    11/07/13  8:46 PM      Result Value Ref Range Status   Specimen Description WOUND BACK   Final   Special Requests     Final   Value: PATIENT ON FOLLOWING VANCOMYCIN, CIPROFLAXIN LUMBAR WOUND   Gram Stain     Final   Value: ABUNDANT WBC PRESENT,BOTH PMN AND MONONUCLEAR     MODERATE GRAM POSITIVE COCCI IN PAIRS   Report Status 11/07/2013 FINAL   Final  WOUND CULTURE     Status: None   Collection Time    11/07/13  8:46 PM      Result Value Ref Range Status   Specimen Description WOUND BACK   Final   Special Requests     Final   Value: PATIENT ON FOLLOWING VANCOMYCIN, CIPROFLAXIN LUMBAR WOUND   Gram Stain     Final   Value: ABUNDANT WBC PRESENT,BOTH PMN AND MONONUCLEAR     NO SQUAMOUS EPITHELIAL CELLS SEEN     MODERATE GRAM POSITIVE COCCI IN PAIRS     Performed at [facility]   Culture     Final   Value: ABUNDANT STAPHYLOCOCCUS AUREUS      Note: RIFAMPIN AND GENTAMICIN SHOULD NOT BE USED AS SINGLE DRUGS FOR TREATMENT OF STAPH INFECTIONS. This organism DOES NOT demonstrate inducible Clindamycin resistance in vitro.     Performed at [facility]   Report Status 11/11/2013 FINAL   Final   Organism ID, Bacteria STAPHYLOCOCCUS AUREUS   Final  ANAEROBIC CULTURE     Status: None   Collection Time    11/07/13  8:46 PM      Result Value Ref Range Status   Specimen Description WOUND BACK   Final   Special Requests     Final   Value: PATIENT ON FOLLOWING VANCOMYCIN, CIPROFLAXIN LUMBAR WOUND   Gram Stain     Final   Value: ABUNDANT WBC PRESENT,BOTH PMN AND MONONUCLEAR     NO SQUAMOUS EPITHELIAL CELLS SEEN     MODERATE GRAM POSITIVE COCCI IN PAIRS     Performed at [facility]     Performed at [facility]   Culture     Final   Value: NO ANAEROBES ISOLATED     Performed at [facility]   Report Status 11/11/2013 FINAL   Final   Assessment: I will plan on at least 5 more weeks of IV vancomycin and oral rifampin for his MSSA lumbar wound infection.  Plan: 1. Continue current antibiotics  Michel Bickers, MD [facility] for Infectious [place] [phone] pager   [phone] cell 11/13/2013, 12:02 PM

## 2013-11-14 ENCOUNTER — Encounter: Payer: 59 | Admitting: Occupational Therapy

## 2013-11-14 LAB — CBC
HEMATOCRIT: 27.1 % — AB (ref 39.0–52.0)
HEMOGLOBIN: 8.9 g/dL — AB (ref 13.0–17.0)
MCH: 26.7 pg (ref 26.0–34.0)
MCHC: 32.8 g/dL (ref 30.0–36.0)
MCV: 81.4 fL (ref 78.0–100.0)
PLATELETS: 608 10*3/uL — AB (ref 150–400)
RBC: 3.33 MIL/uL — AB (ref 4.22–5.81)
RDW: 13.7 % (ref 11.5–15.5)
WBC: 8 10*3/uL (ref 4.0–10.5)

## 2013-11-14 LAB — BASIC METABOLIC PANEL
BUN: 11 mg/dL (ref 6–23)
CALCIUM: 9.6 mg/dL (ref 8.4–10.5)
CHLORIDE: 95 meq/L — AB (ref 96–112)
CO2: 28 mEq/L (ref 19–32)
Creatinine, Ser: 1 mg/dL (ref 0.50–1.35)
GFR calc Af Amer: >90 mL/min (ref >90)
GFR calc non Af Amer: 79 mL/min — ABNORMAL LOW (ref >90)
GLUCOSE: 90 mg/dL (ref 70–99)
Potassium: 4.7 mEq/L (ref 3.7–5.3)
Sodium: 134 mEq/L — ABNORMAL LOW (ref 137–147)

## 2013-11-14 MED ORDER — OXYCODONE HCL 5 MG PO TABS
5.0000 mg | ORAL_TABLET | ORAL | Status: DC | PRN
  Administered 2013-11-14 – 2013-11-18 (×13): 10 mg via ORAL
  Filled 2013-11-14 (×13): qty 2

## 2013-11-14 NOTE — Progress Notes (Signed)
If patient is going home with wound vac please document wound measurements and sign [employer] VAC therapy orders on hard chart for insurance to approve wound vac at discharge; [place] arranged with [facility] for home IV antibiotics; B Pennie Rushing [phone]

## 2013-11-14 NOTE — Progress Notes (Signed)
ANTIBIOTIC CONSULT NOTE - FOLLOW UP  Pharmacy Consult for Vancomycin Indication: lumbar wound  Allergies  Allergen Reactions  . Keflex [Cephalexin] Anaphylaxis    Patient Measurements: Height: 6\' 2"  (188 cm) Weight: 214 lb 3.2 oz (97.16 kg) IBW/kg (Calculated) : 82.2  Vital Signs: Temp: 98.2 F (36.8 C) (02/20 1052) Temp src: Oral (02/20 1052) BP: 139/78 mmHg (02/20 1052) Pulse Rate: 69 (02/20 1052) Intake/Output from previous day: 02/19 0701 - 02/20 0700 In: -  Out: 1350 [Urine:1350] Intake/Output from this shift: Total I/O In: -  Out: 1000 [Urine:1000]  Labs:  Recent Labs  11/12/13 0650 11/14/13 0331  WBC 6.2 8.0  HGB 9.7* 8.9*  PLT 601* 608*  CREATININE 0.81 1.00   Estimated Creatinine Clearance: 89.1 ml/min (by C-G formula based on Cr of 1).  Assessment:    POD # 7 I&D of lumbar wound. Culture grew MSSA, but hx severe reaction to Cephalexin, so to continue Vancomycin IV (day #8). Rifampin PO added 2/19. Wound VAC in place. Afebrile, WBC 8.0. SCr has trended up a little, but good UOP.  PICC placed 2/18. For at least 5 more weeks of antibiotics.  [facility] to assist with transition to home antibiotics and outpatient monitoring.   Vancomycin trough level done on 2/16 = 19.0 mcg/ml. At goal.   Goal of Therapy:  Vancomycin trough level 15-20 mcg/ml  Plan:   Continue Vancomycin 1 gram IV q8hrs.  Weekly trough for duration of course. Next due 2/23.  Will plan to recheck bmet and CBC on 2/23 as well.  Arty Baumgartner, [place] Pager: [phone] 11/14/2013,1:15 PM

## 2013-11-14 NOTE — Progress Notes (Signed)
Patient ID: Isrrael Fluckiger, male   DOB: 03-15-1951, 63 y.o.   MRN: [id number] BP 127/84  Pulse 73  Temp(Src) 97.4 F (36.3 C) (Oral)  Resp 18  Ht 6\' 2"  (1.88 m)  Wt 97.16 kg (214 lb 3.2 oz)  BMI 27.49 kg/m2  SpO2 99% Alert and oriented x 4.  Moving all extremities well Will order oxycodone Trying to wean off the PCA Continue with PT Continue abx

## 2013-11-14 NOTE — Progress Notes (Signed)
Patient ID: Peter [name], male   DOB: 02-May-1951, 63 y.o.   MRN: [id number]         [facility] for Infectious Disease    Date of Admission:  11/07/2013   Total days of antibiotics 8         Principal Problem:   Wound infection after surgery Active Problems:   Acute combined systolic and diastolic heart failure   HTN (hypertension)   Spondylolisthesis of lumbar region   Hypothyroid   Normocytic anemia   . aspirin EC  81 mg Oral BID  . B-complex with vitamin C  1 tablet Oral Daily  . carvedilol  12.5 mg Oral BID WC  . digoxin  0.25 mg Oral Daily  . furosemide  20 mg Oral Daily  . HYDROmorphone PCA 0.3 mg/mL   Intravenous 6 times per day  . irbesartan  150 mg Oral Daily  . isosorbide-hydrALAZINE  1 tablet Oral BID  . levothyroxine  200 mcg Oral QAC breakfast  . multivitamin with minerals  1 tablet Oral Daily  . polyethylene glycol  17 g Oral Daily  . rifampin  600 mg Oral Daily  . senna  1 tablet Oral BID  . sodium chloride  3 mL Intravenous Q12H  . tamsulosin  0.4 mg Oral QPC supper  . vancomycin  1,000 mg Intravenous Q8H    Subjective: He is constipated.  Past Medical History  Diagnosis Date  . Allergy   . Asthma   . Hypertension   . Substance abuse   . Acute combined systolic and diastolic heart failure 09/27/7251  . Shortness of breath     with asthma  . Arthritis   . Cardiomyopathy   . Automatic implantable cardioverter-defibrillator in situ     portable defibrilator  . Complication of anesthesia     problems urinating after anesthesia  . Hypothyroidism     History  Substance Use Topics  . Smoking status: Former Smoker -- 20 years    Quit date: 08/14/1995  . Smokeless tobacco: Never Used  . Alcohol Use: No    History reviewed. No pertinent family history.  Allergies  Allergen Reactions  . Keflex [Cephalexin] Anaphylaxis    Objective: Temp:  [98 F (36.7 C)-99 F (37.2 C)] 98.2 F (36.8 C) (02/20 1052) Pulse Rate:  [65-84] 69  (02/20 1052) Resp:  [14-18] 18 (02/20 1052) BP: (104-139)/(60-84) 139/78 mmHg (02/20 1052) SpO2:  [95 %-100 %] 97 % (02/20 1052)  General: He is alert. Lungs: Clear Cor: Regular S1 and S2 no murmurs VAC dressing in place in lumbar wound  Lab Results Lab Results  Component Value Date   WBC 8.0 11/14/2013   HGB 8.9* 11/14/2013   HCT 27.1* 11/14/2013   MCV 81.4 11/14/2013   PLT 608* 11/14/2013    Lab Results  Component Value Date   CREATININE 1.00 11/14/2013   BUN 11 11/14/2013   NA 134* 11/14/2013   K 4.7 11/14/2013   CL 95* 11/14/2013   CO2 28 11/14/2013    Lab Results  Component Value Date   ALT 15 08/13/2013   AST 18 08/13/2013   ALKPHOS 99 08/13/2013   BILITOT 0.3 08/13/2013   Lab Results  Component Value Date   CRP 3.6* 11/12/2013   Lab Results  Component Value Date   ESRSEDRATE 86* 11/12/2013      Microbiology: Recent Results (from the past 240 hour(s))  GRAM STAIN     Status: None   Collection Time  11/07/13  8:46 PM      Result Value Ref Range Status   Specimen Description WOUND BACK   Final   Special Requests     Final   Value: PATIENT ON FOLLOWING VANCOMYCIN, CIPROFLAXIN LUMBAR WOUND   Gram Stain     Final   Value: ABUNDANT WBC PRESENT,BOTH PMN AND MONONUCLEAR     MODERATE GRAM POSITIVE COCCI IN PAIRS   Report Status 11/07/2013 FINAL   Final  WOUND CULTURE     Status: None   Collection Time    11/07/13  8:46 PM      Result Value Ref Range Status   Specimen Description WOUND BACK   Final   Special Requests     Final   Value: PATIENT ON FOLLOWING VANCOMYCIN, CIPROFLAXIN LUMBAR WOUND   Gram Stain     Final   Value: ABUNDANT WBC PRESENT,BOTH PMN AND MONONUCLEAR     NO SQUAMOUS EPITHELIAL CELLS SEEN     MODERATE GRAM POSITIVE COCCI IN PAIRS     Performed at [facility]   Culture     Final   Value: ABUNDANT STAPHYLOCOCCUS AUREUS     Note: RIFAMPIN AND GENTAMICIN SHOULD NOT BE USED AS SINGLE DRUGS FOR TREATMENT OF STAPH INFECTIONS. This organism  DOES NOT demonstrate inducible Clindamycin resistance in vitro.     Performed at [facility]   Report Status 11/11/2013 FINAL   Final   Organism ID, Bacteria STAPHYLOCOCCUS AUREUS   Final  ANAEROBIC CULTURE     Status: None   Collection Time    11/07/13  8:46 PM      Result Value Ref Range Status   Specimen Description WOUND BACK   Final   Special Requests     Final   Value: PATIENT ON FOLLOWING VANCOMYCIN, CIPROFLAXIN LUMBAR WOUND   Gram Stain     Final   Value: ABUNDANT WBC PRESENT,BOTH PMN AND MONONUCLEAR     NO SQUAMOUS EPITHELIAL CELLS SEEN     MODERATE GRAM POSITIVE COCCI IN PAIRS     Performed at [facility]     Performed at [facility]   Culture     Final   Value: NO ANAEROBES ISOLATED     Performed at [facility]   Report Status 11/11/2013 FINAL   Final   Assessment: I will plan on at least 5 more weeks of IV vancomycin and oral rifampin for his MSSA lumbar wound infection.  Plan: 1. Continue current antibiotics 2. Please call Dr. Johnnye Sima [phone] for any infectious disease questions this weekend 3. I will arrange followup in my clinic in early March  Michel Bickers, [place] for [facility] [phone] pager   [phone] cell 11/14/2013, 11:59 AM

## 2013-11-14 NOTE — Progress Notes (Signed)
Physical Therapy Treatment Patient Details Name: Peter [name] MRN: [id number] DOB: [birth year] Today's Date: 11/14/2013 Time: 2993-7169 PT Time Calculation (min): 23 min  PT Assessment / Plan / Recommendation  History of Present Illness Pt s/p recent L4-5 PLIF surgery now back with infection with wound vac placed at surgical incision   PT Comments   Patient initially not as motivated to ambulate today but with some encouragement was eventually agreeable to walk. While ambulating, patient with sudden urge to have a BM. Patient was returned to room and ambulation ended this session. Encouraged ambulation with staff over weekend as able  Follow Up Recommendations  Home health PT;Supervision/Assistance - 24 hour     Does the patient have the potential to tolerate intense rehabilitation     Barriers to Discharge        Equipment Recommendations       Recommendations for Other Services    Frequency Min 4X/week   Progress towards PT Goals Progress towards PT goals: Progressing toward goals  Plan Current plan remains appropriate    Precautions / Restrictions Precautions Precautions: Fall;Back Precaution Comments: Pt able to state 3/3 back precautions Restrictions Weight Bearing Restrictions: No   Pertinent Vitals/Pain 8/10 pain this session in back. RN aware by patient using call bell   Mobility  Bed Mobility Sidelying to sit: Supervision Transfers Overall transfer level: Needs assistance Equipment used: Rolling walker (2 wheeled) Sit to Stand: Min assist General transfer comment: Demos good technique.  A need for support into standing this session Ambulation/Gait Ambulation/Gait assistance: Supervision Ambulation Distance (Feet): 600 Feet Assistive device: Rolling walker (2 wheeled) Gait Pattern/deviations: Decreased dorsiflexion - left;Decreased dorsiflexion - right;Step-through pattern;Decreased stride length General Gait Details: pt continues with improved posture  today and wanted to walk further however sensation of needing to have a BM. patient return to bathroom    Exercises     PT Diagnosis:    PT Problem List:   PT Treatment Interventions:     PT Goals (current goals can now be found in the care plan section)    Visit Information  Last PT Received On: 11/14/13 Assistance Needed: +1 History of Present Illness: Pt s/p recent L4-5 PLIF surgery now back with infection with wound vac placed at surgical incision    Subjective Data      Cognition  Cognition Arousal/Alertness: Awake/alert Behavior During Therapy: WFL for tasks assessed/performed Overall Cognitive Status: Within Functional Limits for tasks assessed    Balance     End of Session PT - End of Session Equipment Utilized During Treatment: Gait belt Activity Tolerance: Patient tolerated treatment well Patient left: in chair;with call bell/phone within reach (bathroom) Nurse Communication: Mobility status   Peter [name]     Peter [name] 11/14/2013, 11:34 AM 11/14/2013 Peter [name] PTA [phone] pager [phone] office

## 2013-11-14 NOTE — Progress Notes (Signed)
Clinical information faxed to Ricki at [facility] therapy for approval of wound vac; awaiting for [employer] wound vac orders to be completed by Attending MD to be faxed to Georgiann Mccoy RN,BSN,MHA [phone]

## 2013-11-15 NOTE — Progress Notes (Signed)
Patient ID: Peter [name], male   DOB: 05/24/1951, 63 y.o.   MRN: [id number] Better, no fever,ambulating, no pain decrease of drainage

## 2013-11-16 NOTE — Progress Notes (Signed)
OT Cancellation Note and Discharge  Patient Details Name: Peter [name] MRN: [id number] DOB: 1951-03-03   Cancelled Treatment:     In to see pt for treatment session today. He reports that he does not foresee any issues with BADLs at home, that his wife A's him with anything he cannot do for himself due to pain, fatigue, or back precautions. He reports that he has all needed DME. Acute OT will sign off.   Almon Register [phone] 11/16/2013, 2:33 PM

## 2013-11-16 NOTE — Progress Notes (Signed)
Subjective: Patient reports still quite painful.  Wants to keep PCA going today.  Objective: Vital signs in last 24 hours: Temp:  [97.9 F (36.6 C)-98.8 F (37.1 C)] 98.6 F (37 C) (02/22 0859) Pulse Rate:  [67-84] 68 (02/22 1041) Resp:  [11-20] 16 (02/22 0859) BP: (99-146)/(52-87) 146/87 mmHg (02/22 0859) SpO2:  [95 %-99 %] 98 % (02/22 0859)  Intake/Output from previous day: 02/21 0701 - 02/22 0700 In: -  Out: 4200 [Urine:4200] Intake/Output this shift:    Physical Exam: Stable.  Wound vac in place.  Lab Results:  Recent Labs  11/14/13 0331  WBC 8.0  HGB 8.9*  HCT 27.1*  PLT 608*   BMET  Recent Labs  11/14/13 0331  NA 134*  K 4.7  CL 95*  CO2 28  GLUCOSE 90  BUN 11  CREATININE 1.00  CALCIUM 9.6    Studies/Results: No results found.  Assessment/Plan: Still working on pain management.  Patient still notes 7.8 on pain scale.  He knows to decrease usage of PCA as he is able to tolerate in hopes of being able to go home tomorrow.    LOS: 9 days    Peggyann Shoals, MD 11/16/2013, 11:46 AM

## 2013-11-17 ENCOUNTER — Encounter (HOSPITAL_COMMUNITY): Payer: Self-pay | Admitting: Neurosurgery

## 2013-11-17 LAB — VANCOMYCIN, RANDOM: Vancomycin Rm: 30.3 ug/mL

## 2013-11-17 LAB — BASIC METABOLIC PANEL
BUN: 10 mg/dL (ref 6–23)
CHLORIDE: 97 meq/L (ref 96–112)
CO2: 28 meq/L (ref 19–32)
Calcium: 9.7 mg/dL (ref 8.4–10.5)
Creatinine, Ser: 1.23 mg/dL (ref 0.50–1.35)
GFR calc Af Amer: 71 mL/min — ABNORMAL LOW (ref >90)
GFR calc non Af Amer: 61 mL/min — ABNORMAL LOW (ref >90)
GLUCOSE: 87 mg/dL (ref 70–99)
POTASSIUM: 4.4 meq/L (ref 3.7–5.3)
Sodium: 136 mEq/L — ABNORMAL LOW (ref 137–147)

## 2013-11-17 LAB — VANCOMYCIN, TROUGH: Vancomycin Tr: 39.5 ug/mL (ref 10.0–20.0)

## 2013-11-17 MED ORDER — HYDROMORPHONE HCL 2 MG PO TABS
2.0000 mg | ORAL_TABLET | ORAL | Status: DC | PRN
  Administered 2013-11-18 – 2013-11-21 (×15): 2 mg via ORAL
  Filled 2013-11-17 (×15): qty 1

## 2013-11-17 MED ORDER — VANCOMYCIN HCL IN DEXTROSE 750-5 MG/150ML-% IV SOLN
750.0000 mg | Freq: Two times a day (BID) | INTRAVENOUS | Status: DC
  Administered 2013-11-18 – 2013-11-21 (×7): 750 mg via INTRAVENOUS
  Filled 2013-11-17 (×8): qty 150

## 2013-11-17 NOTE — Progress Notes (Signed)
Physical Therapy Treatment Patient Details Name: Peter [name] MRN: [id number] DOB: 09-21-1951 Today's Date: 11/17/2013 Time: 5188-4166 PT Time Calculation (min): 24 min  PT Assessment / Plan / Recommendation  History of Present Illness Pt s/p recent L4-5 PLIF surgery now back with infection with wound vac placed at surgical incision   PT Comments   COntinue to mobilize. Patient did not want to practice steps, states no issue. Anticipate DC soon  Follow Up Recommendations  Home health PT;Supervision/Assistance - 24 hour     Does the patient have the potential to tolerate intense rehabilitation     Barriers to Discharge        Equipment Recommendations  None recommended by PT    Recommendations for Other Services    Frequency Min 4X/week   Progress towards PT Goals Progress towards PT goals: Progressing toward goals  Plan Current plan remains appropriate    Precautions / Restrictions Precautions Precautions: Fall;Back Precaution Comments: Pt able to state 3/3 back precautions   Pertinent Vitals/Pain 8/10 back pain. patient repositioned for comfort     Mobility  Bed Mobility Overal bed mobility: Modified Independent Transfers Overall transfer level: Needs assistance Equipment used: Rolling walker (2 wheeled) Sit to Stand: Min assist General transfer comment: Demos good technique.  A need for support into standing this session Ambulation/Gait Ambulation/Gait assistance: Supervision Ambulation Distance (Feet): 600 Feet Gait velocity: guarded/cautious General Gait Details: better posture this session    Exercises     PT Diagnosis:    PT Problem List:   PT Treatment Interventions:     PT Goals (current goals can now be found in the care plan section)    Visit Information  Last PT Received On: 11/17/13 Assistance Needed: +1 History of Present Illness: Pt s/p recent L4-5 PLIF surgery now back with infection with wound vac placed at surgical incision     Subjective Data      Cognition  Cognition Arousal/Alertness: Awake/alert Behavior During Therapy: WFL for tasks assessed/performed Overall Cognitive Status: Within Functional Limits for tasks assessed    Balance     End of Session PT - End of Session Equipment Utilized During Treatment: Gait belt Activity Tolerance: Patient tolerated treatment well Patient left: in chair;with call bell/phone within reach Nurse Communication: Mobility status   GP     [name] 11/17/2013, 1:14 PM  11/17/2013 [name] PTA [phone] pager [phone] office

## 2013-11-17 NOTE — Progress Notes (Signed)
CRITICAL VALUE ALERT  Critical value received:  Vancomycin trough 39.5   Date of notification:  11/17/13  Time of notification:  0720  Critical value read back:yes  Nurse who received alert:  Shiela Mayer  MD notified (1st page):    Time of first page:    MD notified (2nd page):  Time of second page:  Responding MD:    Time MD responded:

## 2013-11-17 NOTE — Progress Notes (Signed)
ANTIBIOTIC CONSULT NOTE - FOLLOW UP  Pharmacy Consult for Vancomycin Indication: Wound infection  Allergies  Allergen Reactions  . Keflex [Cephalexin] Anaphylaxis   Patient Measurements: Height: 6\' 2"  (188 cm) Weight: 214 lb 3.2 oz (97.16 kg) IBW/kg (Calculated) : 82.2 Vital Signs: Temp: 98 F (36.7 C) (02/23 1334) Temp src: Oral (02/23 1334) BP: 115/65 mmHg (02/23 1334) Pulse Rate: 86 (02/23 1334) Intake/Output from previous day: 02/22 0701 - 02/23 0700 In: -  Out: 1200 [Urine:1200] Intake/Output from this shift:   Labs:  Recent Labs  11/17/13 0625  CREATININE 1.23   Estimated Creatinine Clearance: 72.4 ml/min (by C-G formula based on Cr of 1.23).  Recent Labs  11/17/13 0625 11/17/13 1405  VANCOTROUGH 39.5*  --   VANCORANDOM  --  30.3     Microbiology: Recent Results (from the past 720 hour(s))  GRAM STAIN     Status: None   Collection Time    11/07/13  8:46 PM      Result Value Ref Range Status   Specimen Description WOUND BACK   Final   Special Requests     Final   Value: PATIENT ON FOLLOWING VANCOMYCIN, CIPROFLAXIN LUMBAR WOUND   Gram Stain     Final   Value: ABUNDANT WBC PRESENT,BOTH PMN AND MONONUCLEAR     MODERATE GRAM POSITIVE COCCI IN PAIRS   Report Status 11/07/2013 FINAL   Final  WOUND CULTURE     Status: None   Collection Time    11/07/13  8:46 PM      Result Value Ref Range Status   Specimen Description WOUND BACK   Final   Special Requests     Final   Value: PATIENT ON FOLLOWING VANCOMYCIN, CIPROFLAXIN LUMBAR WOUND   Gram Stain     Final   Value: ABUNDANT WBC PRESENT,BOTH PMN AND MONONUCLEAR     NO SQUAMOUS EPITHELIAL CELLS SEEN     MODERATE GRAM POSITIVE COCCI IN PAIRS     Performed at [facility]   Culture     Final   Value: ABUNDANT STAPHYLOCOCCUS AUREUS     Note: RIFAMPIN AND GENTAMICIN SHOULD NOT BE USED AS SINGLE DRUGS FOR TREATMENT OF STAPH INFECTIONS. This organism DOES NOT demonstrate inducible Clindamycin resistance  in vitro.     Performed at [facility]   Report Status 11/11/2013 FINAL   Final   Organism ID, Bacteria STAPHYLOCOCCUS AUREUS   Final  ANAEROBIC CULTURE     Status: None   Collection Time    11/07/13  8:46 PM      Result Value Ref Range Status   Specimen Description WOUND BACK   Final   Special Requests     Final   Value: PATIENT ON FOLLOWING VANCOMYCIN, CIPROFLAXIN LUMBAR WOUND   Gram Stain     Final   Value: ABUNDANT WBC PRESENT,BOTH PMN AND MONONUCLEAR     NO SQUAMOUS EPITHELIAL CELLS SEEN     MODERATE GRAM POSITIVE COCCI IN PAIRS     Performed at [facility]     Performed at [facility]   Culture     Final   Value: NO ANAEROBES ISOLATED     Performed at [facility]   Report Status 11/11/2013 FINAL   Final    Anti-infectives   Start     Dose/Rate Route Frequency Ordered Stop   11/12/13 1700  rifampin (RIFADIN) capsule 600 mg     600 mg Oral Daily 11/12/13 1602  11/07/13 2300  vancomycin (VANCOCIN) IVPB 1000 mg/200 mL premix  Status:  Discontinued     1,000 mg 200 mL/hr over 60 Minutes Intravenous Every 8 hours 11/07/13 2213 11/17/13 0730      Assessment: 89 YOM on vancomycin and Rifampin for MSSA lumbar wound infection (SJS with cephalosporins). Plan per ID for 5 more weeks of therapy. Vancomycin trough this AM was supra-therapeutic on 1g IV q8h at 39.5 (patient was therapeutic on this 1 week ago- appears to be accumulating). SCr has started to trend up from 0.8 to 1.23. Estimated CrCl~72 mL/min.  Vancomycin trough at 0630 AM was 39.5 Vancomycin trough 8 hrs later was 30.3. Ke = 0.034 with half life of 20.5 hours.   Goal of Therapy:  Vancomycin trough level 15-20 mcg/ml  Plan:  1. Reduce dose to 750mg  IV q12h - next dose on 2/24 at 1000.  2. If patient is to go home, recommend a vancomycin trough prior to 4 or 5th dose to ensure appropriate dosing. 3. Monitor SCr as is rising and adjust dosing as needed.   Sloan Leiter,  PharmD, BCPS Clinical Pharmacist [phone] 11/17/2013,3:24 PM

## 2013-11-17 NOTE — Progress Notes (Signed)
Clinical information faxed to [facility] at [facility] for wound vac approval; B Pennie Rushing

## 2013-11-17 NOTE — Progress Notes (Signed)
Patient ID: Peter [name], male   DOB: 1950/12/23, 63 y.o.   MRN: [id number]         [facility]    Date of Admission:  11/07/2013   Total days of antibiotics 11         Principal Problem:   Wound infection after surgery Active Problems:   Acute combined systolic and diastolic heart failure   HTN (hypertension)   Spondylolisthesis of lumbar region   Hypothyroid   Normocytic anemia   . aspirin EC  81 mg Oral BID  . B-complex with vitamin C  1 tablet Oral Daily  . carvedilol  12.5 mg Oral BID WC  . digoxin  0.25 mg Oral Daily  . furosemide  20 mg Oral Daily  . HYDROmorphone PCA 0.3 mg/mL   Intravenous 6 times per day  . irbesartan  150 mg Oral Daily  . isosorbide-hydrALAZINE  1 tablet Oral BID  . levothyroxine  200 mcg Oral QAC breakfast  . multivitamin with minerals  1 tablet Oral Daily  . polyethylene glycol  17 g Oral Daily  . rifampin  600 mg Oral Daily  . senna  1 tablet Oral BID  . sodium chloride  3 mL Intravenous Q12H  . tamsulosin  0.4 mg Oral QPC supper    Subjective: He is on his PCA.  Past Medical History  Diagnosis Date  . Allergy   . Asthma   . Hypertension   . Substance abuse   . Acute combined systolic and diastolic heart failure 9/51/8841  . Shortness of breath     with asthma  . Arthritis   . Cardiomyopathy   . Automatic implantable cardioverter-defibrillator in situ     portable defibrilator  . Complication of anesthesia     problems urinating after anesthesia  . Hypothyroidism     History  Substance Use Topics  . Smoking status: Former Smoker -- 20 years    Quit date: 08/14/1995  . Smokeless tobacco: Never Used  . Alcohol Use: No    History reviewed. No pertinent family history.  Allergies  Allergen Reactions  . Keflex [Cephalexin] Anaphylaxis    Objective: Temp:  [98.1 F (36.7 C)-99.9 F (37.7 C)] 98.6 F (37 C) (02/23 0953) Pulse Rate:  [72-81] 72 (02/23 0953) Resp:  [13-18] 16 (02/23  1035) BP: (112-146)/(69-84) 133/84 mmHg (02/23 0953) SpO2:  [93 %-100 %] 99 % (02/23 1035) FiO2 (%):  [100 %] 100 % (02/23 1035)  General: He is alert eating lunch with his wife. Lungs: Clear Cor: Regular S1 and S2 no murmurs VAC dressing in place in lumbar wound  Lab Results Lab Results  Component Value Date   WBC 8.0 11/14/2013   HGB 8.9* 11/14/2013   HCT 27.1* 11/14/2013   MCV 81.4 11/14/2013   PLT 608* 11/14/2013    Lab Results  Component Value Date   CREATININE 1.23 11/17/2013   BUN 10 11/17/2013   NA 136* 11/17/2013   K 4.4 11/17/2013   CL 97 11/17/2013   CO2 28 11/17/2013    Lab Results  Component Value Date   ALT 15 08/13/2013   AST 18 08/13/2013   ALKPHOS 99 08/13/2013   BILITOT 0.3 08/13/2013   Lab Results  Component Value Date   CRP 3.6* 11/12/2013   Lab Results  Component Value Date   ESRSEDRATE 86* 11/12/2013    Vancomycin trough level: 39.5   Microbiology: Recent Results (from the past 240 hour(s))  GRAM STAIN  Status: None   Collection Time    11/07/13  8:46 PM      Result Value Ref Range Status   Specimen Description WOUND BACK   Final   Special Requests     Final   Value: PATIENT ON FOLLOWING VANCOMYCIN, CIPROFLAXIN LUMBAR WOUND   Gram Stain     Final   Value: ABUNDANT WBC PRESENT,BOTH PMN AND MONONUCLEAR     MODERATE GRAM POSITIVE COCCI IN PAIRS   Report Status 11/07/2013 FINAL   Final  WOUND CULTURE     Status: None   Collection Time    11/07/13  8:46 PM      Result Value Ref Range Status   Specimen Description WOUND BACK   Final   Special Requests     Final   Value: PATIENT ON FOLLOWING VANCOMYCIN, CIPROFLAXIN LUMBAR WOUND   Gram Stain     Final   Value: ABUNDANT WBC PRESENT,BOTH PMN AND MONONUCLEAR     NO SQUAMOUS EPITHELIAL CELLS SEEN     MODERATE GRAM POSITIVE COCCI IN PAIRS     Performed at [facility]   Culture     Final   Value: ABUNDANT STAPHYLOCOCCUS AUREUS     Note: RIFAMPIN AND GENTAMICIN SHOULD NOT BE USED AS SINGLE  DRUGS FOR TREATMENT OF STAPH INFECTIONS. This organism DOES NOT demonstrate inducible Clindamycin resistance in vitro.     Performed at [facility]   Report Status 11/11/2013 FINAL   Final   Organism ID, Bacteria STAPHYLOCOCCUS AUREUS   Final  ANAEROBIC CULTURE     Status: None   Collection Time    11/07/13  8:46 PM      Result Value Ref Range Status   Specimen Description WOUND BACK   Final   Special Requests     Final   Value: PATIENT ON FOLLOWING VANCOMYCIN, CIPROFLAXIN LUMBAR WOUND   Gram Stain     Final   Value: ABUNDANT WBC PRESENT,BOTH PMN AND MONONUCLEAR     NO SQUAMOUS EPITHELIAL CELLS SEEN     MODERATE GRAM POSITIVE COCCI IN PAIRS     Performed at [facility]     Performed at [facility]   Culture     Final   Value: NO ANAEROBES ISOLATED     Performed at [facility]   Report Status 11/11/2013 FINAL   Final   Assessment: I will plan on at least 5 more weeks of IV vancomycin and oral rifampin for his MSSA lumbar wound infection.  Plan: 1. Continue current antibiotics through March 26 2. He has a follow up appointment with me on March 17 at 2 PM 3. I will sign off now  Michel Bickers, MD [facility] [place] [phone] pager   [phone] cell 11/17/2013, 1:03 PM

## 2013-11-18 NOTE — Progress Notes (Signed)
Patient ID: Peter [name], male   DOB: 1951/01/09, 63 y.o.   MRN: [id number] BP 130/74  Pulse 77  Temp(Src) 98.4 F (36.9 C) (Oral)  Resp 18  Ht 6\' 2"  (1.88 m)  Wt 97.16 kg (214 lb 3.2 oz)  BMI 27.49 kg/m2  SpO2 98% Alert and oriented x 4 Still using pca Trying to wean Will continue with oral pain meds.  Wound vac in place. Afebrile, on vancomycin and rifampin.

## 2013-11-18 NOTE — Progress Notes (Signed)
Physical Therapy Treatment Patient Details Name: Peter [name] MRN: [id number] DOB: 03-10-1951 Today's Date: 11/18/2013 Time: 5400-8676 PT Time Calculation (min): 25 min  PT Assessment / Plan / Recommendation  History of Present Illness Pt s/p recent L4-5 PLIF surgery now back with infection with wound vac placed at surgical incision   PT Comments   Patient requiring assistance this session for transitional movement out of bed and standing. Patient stated today he is more sore and painful as it is his first attempt off PCA.   Follow Up Recommendations  Home health PT;Supervision/Assistance - 24 hour     Does the patient have the potential to tolerate intense rehabilitation     Barriers to Discharge        Equipment Recommendations  None recommended by PT    Recommendations for Other Services    Frequency Min 4X/week   Progress towards PT Goals Progress towards PT goals: Progressing toward goals  Plan Current plan remains appropriate    Precautions / Restrictions Precautions Precautions: Fall;Back Precaution Comments: Pt able to state 3/3 back precautions   Pertinent Vitals/Pain 8/10 pain this session. patient repositioned for comfort      Mobility  Bed Mobility Sidelying to sit: Min assist General bed mobility comments: Patient required Min A today for raising trunk up out of bed.  Transfers Overall transfer level: Needs assistance Equipment used: Rolling walker (2 wheeled) General transfer comment: Demos good technique.  A need for support into standing this session Ambulation/Gait Ambulation/Gait assistance: Supervision Ambulation Distance (Feet): 600 Feet Assistive device: Rolling walker (2 wheeled) Gait velocity: guarded/cautious    Exercises     PT Diagnosis:    PT Problem List:   PT Treatment Interventions:     PT Goals (current goals can now be found in the care plan section)    Visit Information  Last PT Received On: 11/18/13 Assistance Needed:  +1 History of Present Illness: Pt s/p recent L4-5 PLIF surgery now back with infection with wound vac placed at surgical incision    Subjective Data      Cognition  Cognition Arousal/Alertness: Awake/alert Behavior During Therapy: WFL for tasks assessed/performed Overall Cognitive Status: Within Functional Limits for tasks assessed    Balance     End of Session PT - End of Session Equipment Utilized During Treatment: Gait belt Activity Tolerance: Patient tolerated treatment well Patient left: in chair;with call bell/phone within reach Nurse Communication: Mobility status   GP     [name] 11/18/2013, 1:51 PM 11/18/2013 [name] PTA [phone] pager [phone] office

## 2013-11-18 NOTE — Progress Notes (Signed)
Pt wound vac dsg changed.

## 2013-11-19 ENCOUNTER — Encounter: Payer: 59 | Admitting: Occupational Therapy

## 2013-11-19 LAB — VANCOMYCIN, TROUGH: VANCOMYCIN TR: 19.6 ug/mL (ref 10.0–20.0)

## 2013-11-19 NOTE — Progress Notes (Signed)
UR COMPLETED  

## 2013-11-19 NOTE — Progress Notes (Addendum)
ANTIBIOTIC CONSULT NOTE  Pharmacy Consult for Vancomycin Indication: Wound infection  Allergies  Allergen Reactions  . Keflex [Cephalexin] Anaphylaxis   Patient Measurements: Height: 6\' 2"  (188 cm) Weight: 214 lb 3.2 oz (97.16 kg) IBW/kg (Calculated) : 82.2 Vital Signs: Temp: 98.6 F (37 C) (02/25 2220) Temp src: Oral (02/25 2220) BP: 139/77 mmHg (02/25 2220) Pulse Rate: 72 (02/25 2220) Intake/Output from previous day: 02/24 0701 - 02/25 0700 In: 360 [P.O.:360] Out: 1950 [Urine:1950] Intake/Output from this shift:   Labs:  Recent Labs  11/17/13 0625  CREATININE 1.23   Estimated Creatinine Clearance: 72.4 ml/min (by C-G formula based on Cr of 1.23).  Recent Labs  11/17/13 0625 11/17/13 1405 11/19/13 2200  VANCOTROUGH 39.5*  --  19.6  VANCORANDOM  --  30.3  --      Microbiology: Recent Results (from the past 720 hour(s))  GRAM STAIN     Status: None   Collection Time    11/07/13  8:46 PM      Result Value Ref Range Status   Specimen Description WOUND BACK   Final   Special Requests     Final   Value: PATIENT ON FOLLOWING VANCOMYCIN, CIPROFLAXIN LUMBAR WOUND   Gram Stain     Final   Value: ABUNDANT WBC PRESENT,BOTH PMN AND MONONUCLEAR     MODERATE GRAM POSITIVE COCCI IN PAIRS   Report Status 11/07/2013 FINAL   Final  WOUND CULTURE     Status: None   Collection Time    11/07/13  8:46 PM      Result Value Ref Range Status   Specimen Description WOUND BACK   Final   Special Requests     Final   Value: PATIENT ON FOLLOWING VANCOMYCIN, CIPROFLAXIN LUMBAR WOUND   Gram Stain     Final   Value: ABUNDANT WBC PRESENT,BOTH PMN AND MONONUCLEAR     NO SQUAMOUS EPITHELIAL CELLS SEEN     MODERATE GRAM POSITIVE COCCI IN PAIRS     Performed at [facility]   Culture     Final   Value: ABUNDANT STAPHYLOCOCCUS AUREUS     Note: RIFAMPIN AND GENTAMICIN SHOULD NOT BE USED AS SINGLE DRUGS FOR TREATMENT OF STAPH INFECTIONS. This organism DOES NOT demonstrate  inducible Clindamycin resistance in vitro.     Performed at [facility]   Report Status 11/11/2013 FINAL   Final   Organism ID, Bacteria STAPHYLOCOCCUS AUREUS   Final  ANAEROBIC CULTURE     Status: None   Collection Time    11/07/13  8:46 PM      Result Value Ref Range Status   Specimen Description WOUND BACK   Final   Special Requests     Final   Value: PATIENT ON FOLLOWING VANCOMYCIN, CIPROFLAXIN LUMBAR WOUND   Gram Stain     Final   Value: ABUNDANT WBC PRESENT,BOTH PMN AND MONONUCLEAR     NO SQUAMOUS EPITHELIAL CELLS SEEN     MODERATE GRAM POSITIVE COCCI IN PAIRS     Performed at [facility]     Performed at [facility]   Culture     Final   Value: NO ANAEROBES ISOLATED     Performed at [facility]   Report Status 11/11/2013 FINAL   Final    Anti-infectives   Start     Dose/Rate Route Frequency Ordered Stop   11/18/13 1000  vancomycin (VANCOCIN) IVPB 750 mg/150 ml premix     750 mg  150 mL/hr over 60 Minutes Intravenous Every 12 hours 11/17/13 1529     11/12/13 1700  rifampin (RIFADIN) capsule 600 mg     600 mg Oral Daily 11/12/13 1602     11/07/13 2300  vancomycin (VANCOCIN) IVPB 1000 mg/200 mL premix  Status:  Discontinued     1,000 mg 200 mL/hr over 60 Minutes Intravenous Every 8 hours 11/07/13 2213 11/17/13 0730      Assessment: 63 YO Male with MSSA lumbar wound infection for vancomycin  Goal of Therapy:  Vancomycin trough level 15-20 mcg/ml  Plan:  Continue current vancomycin regimen while in hospital  Could change vancomycin 1500 mg IV q24h for ease of administration as an outpatient, and then recheck level early next week  Phillis Knack, PharmD, BCPS  11/19/2013,11:05 PM

## 2013-11-19 NOTE — Progress Notes (Signed)
Patient ID: Peter [name], male   DOB: 14-Jul-1951, 63 y.o.   MRN: [id number] BP 139/88  Pulse 78  Temp(Src) 98.8 F (37.1 C) (Other (Comment))  Resp 18  Ht 6\' 2"  (1.88 m)  Wt 97.16 kg (214 lb 3.2 oz)  BMI 27.49 kg/m2  SpO2 98% Alert and oriented x 4 Speech is clear and fluent Moving well Wound vac is in place Will order bmet Continue pt. Switched to oral pain medications today.

## 2013-11-19 NOTE — Progress Notes (Signed)
Chaplain offered a ministry of presence to pt and his wife.  Chaplain enjoyed hearing pt speak about his faith tradition.  Chaplain listened empathically and with great interest.   11/19/13 1200  Clinical Encounter Type  Visited With Patient and family together  Visit Type Spiritual support    Estelle June, chaplain pager [phone]

## 2013-11-19 NOTE — Progress Notes (Signed)
PCA d/c'd. Pt has not used PCA today. Pain tolerated with PO meds

## 2013-11-20 ENCOUNTER — Encounter: Payer: 59 | Admitting: Occupational Therapy

## 2013-11-20 NOTE — Progress Notes (Addendum)
Physical Therapy Treatment Patient Details Name: Peter [name] MRN: [id number] DOB: [birth year] Today's Date: 11/20/2013 Time: 3343-5686 PT Time Calculation (min): 24 min  PT Assessment / Plan / Recommendation  History of Present Illness Pt s/p recent L4-5 PLIF surgery now back with infection with wound vac placed at surgical incision   PT Comments   Progressing with mobility, increased ambulation despite fatigue today. One standing rest break. Patient continues to remain positive and determined to get better.  Spoke with patient at length regarding positioning and importance of continued mobility with staff as often as tolerated.   Follow Up Recommendations  Home health PT;Supervision/Assistance - 24 hour           Equipment Recommendations  None recommended by PT       Frequency Min 4X/week   Progress towards PT Goals Progressing towards PT goals  Plan Current plan remains appropriate    Precautions / Restrictions Precautions Precautions: Fall;Back Precaution Comments: Pt able to state 3/3 back precautions   Pertinent Vitals/Pain 5/10    Mobility  Bed Mobility Overal bed mobility: Needs Assistance Bed Mobility: Rolling;Sidelying to Sit;Sit to Sidelying Rolling: Supervision Sidelying to sit: Min assist Sit to sidelying: Min assist  Transfers Overall transfer level: Needs assistance Equipment used: Rolling walker (2 wheeled) Transfers: Sit to/from Stand Sit to Stand: Min assist General transfer comment: Demos good technique.  A need for support into standing this session Ambulation/Gait Ambulation/Gait assistance: Supervision Ambulation Distance (Feet): 740 Feet Assistive device: Rolling walker (2 wheeled) Gait velocity: guarded/cautious General Gait Details: improving cadence, still with some fatigue during ambulation, one standing rest break taken      PT Goals (current goals can now be found in the care plan section) Acute Rehab PT Goals Patient Stated  Goal: to get better  Visit Information  Last PT Received On: 11/20/13 Assistance Needed: +1 History of Present Illness: Pt s/p recent L4-5 PLIF surgery now back with infection with wound vac placed at surgical incision    Subjective Data  Subjective: I appreciate all the encouragement Patient Stated Goal: to get better   Cognition  Cognition Arousal/Alertness: Awake/alert Behavior During Therapy: WFL for tasks assessed/performed Overall Cognitive Status: Within Functional Limits for tasks assessed       End of Session PT - End of Session Equipment Utilized During Treatment: Gait belt Activity Tolerance: Patient tolerated treatment well Patient left: in chair;with call bell/phone within reach Nurse Communication: Mobility status   GP     Peter [name] 11/20/2013, 2:17 PM Peter [name], [place] DPT  [phone]

## 2013-11-20 NOTE — Progress Notes (Signed)
Per Rickie with [facility], wound vac has been approved by insurance. ED with [facility] to deliver wound vac to patient's room today to take home at discharge; [facility] to provide IV antibiotics for home at discharge. Mindi Slicker RN,BSN,MHA [phone]

## 2013-11-21 ENCOUNTER — Encounter: Payer: 59 | Admitting: Occupational Therapy

## 2013-11-21 MED ORDER — RIFAMPIN 300 MG PO CAPS: 600.0000 mg | ORAL_CAPSULE | Freq: Every day | ORAL | Status: DC

## 2013-11-21 MED ORDER — SODIUM CHLORIDE 0.9 % IV SOLN: 1500.0000 mg | INTRAVENOUS | Status: DC

## 2013-11-21 MED ORDER — VANCOMYCIN HCL 10 G IV SOLR: 1500.0000 mg | INTRAVENOUS | Status: AC

## 2013-11-21 MED ORDER — HYDROMORPHONE HCL 2 MG PO TABS
2.0000 mg | ORAL_TABLET | ORAL | Status: DC | PRN
Start: 2013-11-21 — End: 2014-06-09

## 2013-11-21 MED ORDER — HEPARIN SOD (PORK) LOCK FLUSH 100 UNIT/ML IV SOLN: 250.0000 [IU] | INTRAVENOUS | Status: DC | PRN

## 2013-11-21 NOTE — Discharge Instructions (Signed)
Continue with the rifampin and vancomycin. The wound nurse will contact me directly if there are any problems. I will see you in two weeks.

## 2013-11-21 NOTE — Progress Notes (Signed)
Chaplain offered support to pt by visiting, reviewing an AD with pt, gathering 2 witnesses and requesting notary to be present so that AD could be finalized.  Advance Directive was finalized.  Chaplain made 4 copies of notarized AD, gave 1 copy to the pt's nurse who put it in the pt's chart and gave the other 3 copies and the original to the pt.  Pt expressed thanks to chaplain, 2 witnesses and the notary for the time and effort expended in finalizing the AD.   11/21/13 1500  Clinical Encounter Type  Visited With Patient  Visit Type Spiritual support  Referral From Patient  Advance Directives (For Healthcare)  Advance Directive Patient would like information   Estelle June, chaplain pager [phone]

## 2013-11-21 NOTE — Progress Notes (Signed)
Home health care arranged with [facility] for IV antibiotics and wound vac dressing change; [facility] delivered the wound vac to the patient's room yesterday for home; Aneta Mins [phone]

## 2013-11-21 NOTE — Progress Notes (Signed)
Pt A&O x4; pt discharge education and instructions completed with pt and spouse at side. Both voices understanding and denies any questions. Pt Single lumen PICC hep locked by IV team; pt wound vac switched to his ordered portable home wound vac. Vac dsg clean and intact. Pt foley remained intact and in place. Foley bag switched to leg bag as requested by pt. Pt transported of unit via wheelchair with belongings and spouse at side. [facility] RN came to educate pt and his spouse about their services.

## 2013-11-25 ENCOUNTER — Encounter: Payer: 59 | Admitting: Occupational Therapy

## 2013-11-26 ENCOUNTER — Encounter: Payer: 59 | Admitting: Occupational Therapy

## 2013-11-26 NOTE — Discharge Summary (Signed)
Physician Discharge Summary  Patient ID: Peter [name] MRN: [id number] DOB/AGE: 02/21/1951 63 y.o.  Admit date: 11/07/2013 Discharge date: 11/26/2013  Admission Diagnoses:wound infection   Discharge Diagnoses:  Principal Problem:   Wound infection after surgery Active Problems:   Acute combined systolic and diastolic heart failure   HTN (hypertension)   Spondylolisthesis of lumbar region   Hypothyroid   Normocytic anemia   Discharged Condition: good  Hospital Course: Peter [name] was taken to the operating room on the day of admission and had his wound opened and washed out. I found purulent material in the wound. The cultures grew staphylococcus aureus and he was started on vancomycin and rifampin which he will continue for 6 weeks. I placed a wound vac in the OR  Consults: ID  Significant Diagnostic Studies: microbiology: wound culture: positive for staphylococcus Aureus  Treatments: surgery: wound debridement  Discharge Exam: Blood pressure 164/85, pulse 86, temperature 98.3 F (36.8 C), temperature source Oral, resp. rate 18, height 6\' 2"  (1.88 m), weight 97.16 kg (214 lb 3.2 oz), SpO2 99.00%. General appearance: alert, cooperative and appears stated age Neurologic: Mental status: Alert, oriented, thought content appropriate Cranial nerves: normal Motor: weakness in hands (cervical myelopathy), weakness in lower extremities  Disposition: 01-Home or Self Care   Future Appointments Provider Department Dept Phone   12/09/2013 2:00 PM Michel Bickers, MD [facility] [phone]       Medication List         acetaminophen 500 MG tablet  Commonly known as:  TYLENOL  Take 1,500 mg by mouth 2 (two) times daily as needed for pain.     albuterol 108 (90 BASE) MCG/ACT inhaler  Commonly known as:  PROVENTIL HFA;VENTOLIN HFA  Inhale 2 puffs into the lungs every 6 (six) hours as needed for wheezing.     aspirin EC 81 MG tablet  Take  81 mg by mouth 2 (two) times daily.     b complex vitamins tablet  Take 1 tablet by mouth daily.     carvedilol 12.5 MG tablet  Commonly known as:  COREG  Take 12.5 mg by mouth 2 (two) times daily with a meal.     digoxin 0.25 MG tablet  Commonly known as:  LANOXIN  Take 0.25 mg by mouth daily.     furosemide 20 MG tablet  Commonly known as:  LASIX  Take 1 tablet (20 mg total) by mouth daily.     HYDROmorphone 2 MG tablet  Commonly known as:  DILAUDID  Take 1 tablet (2 mg total) by mouth every 4 (four) hours as needed for severe pain.     isosorbide-hydrALAZINE 20-37.5 MG per tablet  Commonly known as:  BIDIL  Take 1 tablet by mouth 2 (two) times daily.     levothyroxine 200 MCG tablet  Commonly known as:  SYNTHROID, LEVOTHROID  Take 200 mcg by mouth daily before breakfast.     multivitamin with minerals Tabs tablet  Take 1 tablet by mouth daily.     olmesartan 20 MG tablet  Commonly known as:  BENICAR  Take 20 mg by mouth daily.     oxyCODONE-acetaminophen 5-325 MG per tablet  Commonly known as:  PERCOCET/ROXICET  Take 1-2 tablets by mouth every 6 (six) hours as needed for moderate pain.     rifampin 300 MG capsule  Commonly known as:  RIFADIN  Take 2 capsules (600 mg total) by mouth daily.     senna-docusate 8.6-50 MG  per tablet  Commonly known as:  Senokot-S  Take 1 tablet by mouth at bedtime as needed for mild constipation.     sodium chloride 0.9 % SOLN 250 mL with vancomycin 10 G SOLR 1,500 mg  Inject 1,500 mg into the vein daily.     tamsulosin 0.4 MG Caps capsule  Commonly known as:  FLOMAX  Take 1 capsule (0.4 mg total) by mouth daily after supper.     traMADol 50 MG tablet  Commonly known as:  ULTRAM  Take 50 mg by mouth every 6 (six) hours as needed for pain.           Follow-up Information   Follow up with Deriyah Kunath L, MD In 2 weeks. (For wound re-check, call to make an appointment )    Specialty:  Neurosurgery   Contact information:    [address], [address]                         [address] [place] [address] [phone]       Signed: Azul Coffie L 11/26/2013, 6:53 PM

## 2013-11-28 ENCOUNTER — Encounter: Payer: 59 | Admitting: Occupational Therapy

## 2013-12-09 ENCOUNTER — Encounter: Payer: Self-pay | Admitting: Internal Medicine

## 2013-12-09 ENCOUNTER — Ambulatory Visit (INDEPENDENT_AMBULATORY_CARE_PROVIDER_SITE_OTHER): Payer: 59 | Admitting: Internal Medicine

## 2013-12-09 VITALS — BP 120/74 | HR 93 | Temp 98.4°F | Ht 74.0 in | Wt 208.0 lb

## 2013-12-09 DIAGNOSIS — T8149XA Infection following a procedure, other surgical site, initial encounter: Secondary | ICD-10-CM

## 2013-12-09 DIAGNOSIS — T8140XA Infection following a procedure, unspecified, initial encounter: Secondary | ICD-10-CM

## 2013-12-09 LAB — C-REACTIVE PROTEIN: CRP: 0.8 mg/dL — ABNORMAL HIGH (ref <0.60)

## 2013-12-09 MED ORDER — RIFAMPIN 300 MG PO CAPS: 600.0000 mg | ORAL_CAPSULE | Freq: Every day | ORAL | Status: DC

## 2013-12-09 NOTE — Progress Notes (Signed)
Patient ID: Peter [name], male   DOB: [birth year], 63 y.o.   MRN: [id number]         [facility]  Patient Active Problem List   Diagnosis Date Noted  . Hypothyroid 11/12/2013  . Normocytic anemia 11/12/2013  . Wound infection after surgery 11/07/2013  . Spondylolisthesis of lumbar region 10/22/2013  . Dizziness and giddiness 08/13/2013    Class: Acute  . Acute renal insufficiency 08/13/2013    Class: Acute  . Spondylosis, cervical, with myelopathy 08/01/2013  . Weakness 06/03/2013  . Acute combined systolic and diastolic heart failure 44/09/270  . Volume overload 03/11/2013  . HTN (hypertension) 03/11/2013  . Abnormal CK 03/11/2013  . DOE (dyspnea on exertion) 03/11/2013  . Unspecified hypothyroidism 03/11/2013    Patient's Medications  New Prescriptions   No medications on file  Previous Medications   ACETAMINOPHEN (TYLENOL) 500 MG TABLET    Take 1,500 mg by mouth 2 (two) times daily as needed for pain.    ALBUTEROL (PROVENTIL HFA;VENTOLIN HFA) 108 (90 BASE) MCG/ACT INHALER    Inhale 2 puffs into the lungs every 6 (six) hours as needed for wheezing.   ASPIRIN EC 81 MG TABLET    Take 81 mg by mouth 2 (two) times daily.   B COMPLEX VITAMINS TABLET    Take 1 tablet by mouth daily.   CARVEDILOL (COREG) 12.5 MG TABLET    Take 12.5 mg by mouth 2 (two) times daily with a meal.   DIGOXIN (LANOXIN) 0.25 MG TABLET    Take 0.25 mg by mouth daily.   FUROSEMIDE (LASIX) 20 MG TABLET    Take 1 tablet (20 mg total) by mouth daily.   HYDROMORPHONE (DILAUDID) 2 MG TABLET    Take 1 tablet (2 mg total) by mouth every 4 (four) hours as needed for severe pain.   ISOSORBIDE-HYDRALAZINE (BIDIL) 20-37.5 MG PER TABLET    Take 1 tablet by mouth 2 (two) times daily.   LEVOTHYROXINE (SYNTHROID, LEVOTHROID) 200 MCG TABLET    Take 200 mcg by mouth daily before breakfast.   MULTIPLE VITAMIN (MULTIVITAMIN WITH MINERALS) TABS    Take 1 tablet by mouth daily.   OLMESARTAN  (BENICAR) 20 MG TABLET    Take 20 mg by mouth daily.   SENNA-DOCUSATE (SENOKOT-S) 8.6-50 MG PER TABLET    Take 1 tablet by mouth at bedtime as needed for mild constipation.   SODIUM CHLORIDE 0.9 % SOLN 250 ML WITH VANCOMYCIN 10 G SOLR 1,500 MG    Inject 1,500 mg into the vein daily.   TAMSULOSIN (FLOMAX) 0.4 MG CAPS CAPSULE    Take 1 capsule (0.4 mg total) by mouth daily after supper.   TRAMADOL (ULTRAM) 50 MG TABLET    Take 50 mg by mouth every 6 (six) hours as needed for pain.  Modified Medications   Modified Medication Previous Medication   RIFAMPIN (RIFADIN) 300 MG CAPSULE rifampin (RIFADIN) 300 MG capsule      Take 2 capsules (600 mg total) by mouth daily.    Take 2 capsules (600 mg total) by mouth daily.   RIFAMPIN (RIFADIN) 300 MG CAPSULE rifampin (RIFADIN) 300 MG capsule      Take 2 capsules (600 mg total) by mouth daily.    Take 2 capsules (600 mg total) by mouth daily.  Discontinued Medications   OXYCODONE-ACETAMINOPHEN (PERCOCET/ROXICET) 5-325 MG PER TABLET    Take 1-2 tablets by mouth every 6 (six) hours as needed for moderate pain.  Subjective: Peter [name] is in for his hospital followup visit. He underwent lumbar decompression and fusion on January 28. He was readmitted last month with MSSA lumbar wound infection. He underwent incision and drainage and had a vac wound dressing placed. He has a history of anaphylaxis with cephalosporins so he was treated with IV vancomycin and oral rifampin through a PICC. He is now completed 33 days of total antibiotic therapy. He is feeling better since returning home. He is still requiring dialogue about every 4-6 hours but he has been able to increase his activity steadily. He has had no problems tolerating his antibiotics or PICC.  Review of Systems: Pertinent items are noted in HPI.  Past Medical History  Diagnosis Date  . Allergy   . Asthma   . Hypertension   . Substance abuse   . Acute combined systolic and diastolic heart failure  7/32/2025  . Shortness of breath     with asthma  . Arthritis   . Cardiomyopathy   . Automatic implantable cardioverter-defibrillator in situ     portable defibrilator  . Complication of anesthesia     problems urinating after anesthesia  . Hypothyroidism     History  Substance Use Topics  . Smoking status: Former Smoker -- 20 years    Quit date: 08/14/1995  . Smokeless tobacco: Never Used  . Alcohol Use: No    No family history on file.  Allergies  Allergen Reactions  . Keflex [Cephalexin] Anaphylaxis    Objective: Temp: 98.4 F (36.9 C) (03/17 1354) Temp src: Oral (03/17 1354) BP: 120/74 mmHg (03/17 1354) Pulse Rate: 93 (03/17 1354)  General: He is in good spirits Skin: Right arm PICC site appears normal Lungs: Clear Cor: Regular S1 and S2 no murmurs He has a VAC wound dressing in a U-shaped lumbar wound  Lab Results Lab Results  Component Value Date   CRP 3.6* 11/12/2013   Lab Results  Component Value Date   ESRSEDRATE 86* 11/12/2013     Assessment: He is improving clinically on therapy for her MSSA postoperative lumbar infection. I will repeat his inflammatory markers and see him back next week to consider when to stop his IV vancomycin and have the PICC removed.  Plan: 1. Continue vancomycin and rifampin for now 2. Repeat sedimentation rate and C-reactive protein 3. Followup on March 25   Michel Bickers, [facility] [phone] pager   [phone] cell 12/09/2013, 2:22 PM

## 2013-12-10 ENCOUNTER — Telehealth: Payer: Self-pay | Admitting: *Deleted

## 2013-12-10 LAB — SEDIMENTATION RATE: Sed Rate: 36 mm/hr — ABNORMAL HIGH (ref 0–16)

## 2013-12-10 NOTE — Telephone Encounter (Signed)
Unable to obtain through PICC or peripheral blood stick.  Will attempt again tomorrow.

## 2013-12-16 ENCOUNTER — Encounter: Payer: Self-pay | Admitting: Internal Medicine

## 2013-12-17 ENCOUNTER — Ambulatory Visit (INDEPENDENT_AMBULATORY_CARE_PROVIDER_SITE_OTHER): Payer: 59 | Admitting: Internal Medicine

## 2013-12-17 ENCOUNTER — Encounter: Payer: Self-pay | Admitting: Internal Medicine

## 2013-12-17 ENCOUNTER — Telehealth: Payer: Self-pay | Admitting: *Deleted

## 2013-12-17 VITALS — BP 137/89 | HR 86 | Temp 98.4°F | Ht 75.0 in | Wt 211.0 lb

## 2013-12-17 DIAGNOSIS — T8140XA Infection following a procedure, unspecified, initial encounter: Secondary | ICD-10-CM

## 2013-12-17 DIAGNOSIS — T8149XA Infection following a procedure, other surgical site, initial encounter: Secondary | ICD-10-CM

## 2013-12-17 MED ORDER — DOXYCYCLINE HYCLATE 100 MG PO TABS: 100.0000 mg | ORAL_TABLET | Freq: Two times a day (BID) | ORAL | Status: DC

## 2013-12-17 MED ORDER — VANCOMYCIN HCL 1000 MG IV SOLR: 1750.0000 mg | INTRAVENOUS | Status: AC

## 2013-12-17 NOTE — Telephone Encounter (Signed)
Verbal order per Dr. Megan Salon given to [facility] with [facility] to stop IV antibiotics after tomorrows dose and pull picc line on Friday 12/19/13. Myrtis Hopping

## 2013-12-17 NOTE — Progress Notes (Signed)
Patient ID: Peter [name], male   DOB: [birth year], 63 y.o.   MRN: [id number]         [facility] for [facility]  Patient Active Problem List   Diagnosis Date Noted  . Hypothyroid 11/12/2013  . Normocytic anemia 11/12/2013  . Wound infection after surgery 11/07/2013  . Spondylolisthesis of lumbar region 10/22/2013  . Dizziness and giddiness 08/13/2013    Class: Acute  . Acute renal insufficiency 08/13/2013    Class: Acute  . Spondylosis, cervical, with myelopathy 08/01/2013  . Weakness 06/03/2013  . Acute combined systolic and diastolic heart failure 72/53/6644  . Volume overload 03/11/2013  . HTN (hypertension) 03/11/2013  . Abnormal CK 03/11/2013  . DOE (dyspnea on exertion) 03/11/2013  . Unspecified hypothyroidism 03/11/2013    Patient's Medications  New Prescriptions   DOXYCYCLINE (VIBRA-TABS) 100 MG TABLET    Take 1 tablet (100 mg total) by mouth 2 (two) times daily.  Previous Medications   ACETAMINOPHEN (TYLENOL) 500 MG TABLET    Take 1,500 mg by mouth 2 (two) times daily as needed for pain.    ALBUTEROL (PROVENTIL HFA;VENTOLIN HFA) 108 (90 BASE) MCG/ACT INHALER    Inhale 2 puffs into the lungs every 6 (six) hours as needed for wheezing.   ASPIRIN EC 81 MG TABLET    Take 81 mg by mouth 2 (two) times daily.   B COMPLEX VITAMINS TABLET    Take 1 tablet by mouth daily.   CARVEDILOL (COREG) 12.5 MG TABLET    Take 12.5 mg by mouth 2 (two) times daily with a meal.   DIGOXIN (LANOXIN) 0.25 MG TABLET    Take 0.25 mg by mouth daily.   FUROSEMIDE (LASIX) 20 MG TABLET    Take 1 tablet (20 mg total) by mouth daily.   HYDROMORPHONE (DILAUDID) 2 MG TABLET    Take 1 tablet (2 mg total) by mouth every 4 (four) hours as needed for severe pain.   ISOSORBIDE-HYDRALAZINE (BIDIL) 20-37.5 MG PER TABLET    Take 1 tablet by mouth 2 (two) times daily.   LEVOTHYROXINE (SYNTHROID, LEVOTHROID) 200 MCG TABLET    Take 200 mcg by mouth daily before breakfast.   MULTIPLE VITAMIN  (MULTIVITAMIN WITH MINERALS) TABS    Take 1 tablet by mouth daily.   OLMESARTAN (BENICAR) 20 MG TABLET    Take 20 mg by mouth daily.   RIFAMPIN (RIFADIN) 300 MG CAPSULE    Take 2 capsules (600 mg total) by mouth daily.   SENNA-DOCUSATE (SENOKOT-S) 8.6-50 MG PER TABLET    Take 1 tablet by mouth at bedtime as needed for mild constipation.   TAMSULOSIN (FLOMAX) 0.4 MG CAPS CAPSULE    Take 1 capsule (0.4 mg total) by mouth daily after supper.  Modified Medications   Modified Medication Previous Medication   SODIUM CHLORIDE 0.9 % SOLN 250 ML WITH VANCOMYCIN 1000 MG SOLR 1,750 MG sodium chloride 0.9 % SOLN 250 mL with vancomycin 1000 MG SOLR 1,000 mg      Inject 1,750 mg into the vein daily.    Inject 1,750 mg into the vein daily.  Discontinued Medications   RIFAMPIN (RIFADIN) 300 MG CAPSULE    Take 2 capsules (600 mg total) by mouth daily.   TRAMADOL (ULTRAM) 50 MG TABLET    Take 50 mg by mouth every 6 (six) hours as needed for pain.    Subjective: Mr. Shin is in for his routine followup visit. He has now completed 40 days of IV vancomycin and  oral rifampin for his MSSA lumbar infection. He is still requiring wanted 3-4 times daily but has been able to be much more active recently. He is still having his wound VAC dressing change 3 times each week but has been told by his nurses that the wound is diminishing in size. He has had no problems tolerating his PICC or antibiotics.  Review of Systems: Pertinent items are noted in HPI.  Past Medical History  Diagnosis Date  . Allergy   . Asthma   . Hypertension   . Substance abuse   . Acute combined systolic and diastolic heart failure 10/22/7865  . Shortness of breath     with asthma  . Arthritis   . Cardiomyopathy   . Automatic implantable cardioverter-defibrillator in situ     portable defibrilator  . Complication of anesthesia     problems urinating after anesthesia  . Hypothyroidism     History  Substance Use Topics  . Smoking  status: Former Smoker -- 20 years    Quit date: 08/14/1995  . Smokeless tobacco: Never Used  . Alcohol Use: No    No family history on file.  Allergies  Allergen Reactions  . Keflex [Cephalexin] Anaphylaxis    Objective: Temp: 98.4 F (36.9 C) (03/25 1543) Temp src: Oral (03/25 1543) BP: 137/89 mmHg (03/25 1543) Pulse Rate: 86 (03/25 1543)  General: He is in good spirits Skin: Right arm PICC site appears normal Lungs: Clear Cor: Regular S1 and S2 with no murmurs His U-shaped lumbar VAC dressing is in place  Lab Results Lab Results  Component Value Date   CRP 0.8* 12/09/2013   Lab Results  Component Value Date   ESRSEDRATE 36* 12/09/2013   Creatinine 12/17/2013: 0.82 Vancomycin trough 12/17/2013: 11.2   Assessment: He is improving on therapy for MSSA lumbar infection. I will have him complete 2 more days of IV vancomycin and then have the PICC pulled and start oral doxycycline along with his oral rifampin.  Plan: 1. Stop vancomycin on March 27 and have the PICC pulled and then start oral doxycycline along with oral rifampin 2. Followup in 3 weeks   Michel Bickers, MD [facility] [place] [phone] pager   [phone] cell 12/17/2013, 4:22 PM

## 2013-12-19 ENCOUNTER — Telehealth: Payer: Self-pay | Admitting: *Deleted

## 2013-12-19 NOTE — Telephone Encounter (Signed)
Patient called and advised that his PICC was taken out today and he went to the pharmacy to get his Doxy oral Rx today. He advised it cost $156 out of pocket and he just can not afford it. He advised he is taking his Rifampin, it was filled with no problems. He wants to know if Dr Megan Salon could prescribe something else for him that is less expensive and would take care of the infection. Advised the patient that the doctor is out of the office today and that I will have to get in a message and call him back with an answer.

## 2013-12-22 ENCOUNTER — Other Ambulatory Visit: Payer: Self-pay | Admitting: Internal Medicine

## 2013-12-22 DIAGNOSIS — T8149XA Infection following a procedure, other surgical site, initial encounter: Secondary | ICD-10-CM

## 2013-12-23 ENCOUNTER — Other Ambulatory Visit: Payer: Self-pay | Admitting: *Deleted

## 2013-12-23 DIAGNOSIS — T8149XA Infection following a procedure, other surgical site, initial encounter: Secondary | ICD-10-CM

## 2013-12-23 MED ORDER — DOXYCYCLINE HYCLATE 50 MG PO CAPS: 100.0000 mg | ORAL_CAPSULE | Freq: Two times a day (BID) | ORAL | Status: DC

## 2013-12-23 NOTE — Telephone Encounter (Signed)
Called patient's pharmacy, insurance, and prescription management service.  Per [email or web address], doxycycline hyclate capsules are covered, doxycycline tablets are not.  Confirmed with Dr. Megan Salon that these are the same medications.  Sent in prescription for doxycycline hyclate capsules 50 mg, take 2 caps (100mg ) by mouth BID #120, R1.  Per [facility], patient's insurance will cover this with no copay.  Notified patient's homehealth nurse Geralyn Flash [phone]) and patient. He will pick it up today.  Landis Gandy, RN

## 2013-12-29 ENCOUNTER — Emergency Department (HOSPITAL_COMMUNITY)
Admission: EM | Admit: 2013-12-29 | Discharge: 2013-12-29 | Disposition: A | Discharge status: Home / Self Care | Payer: 59 | Attending: Emergency Medicine | Admitting: Emergency Medicine

## 2013-12-29 ENCOUNTER — Encounter (HOSPITAL_COMMUNITY): Payer: Self-pay | Admitting: Emergency Medicine

## 2013-12-29 ENCOUNTER — Emergency Department (HOSPITAL_COMMUNITY): Payer: 59

## 2013-12-29 DIAGNOSIS — Y838 Other surgical procedures as the cause of abnormal reaction of the patient, or of later complication, without mention of misadventure at the time of the procedure: Secondary | ICD-10-CM | POA: Insufficient documentation

## 2013-12-29 DIAGNOSIS — K5900 Constipation, unspecified: Secondary | ICD-10-CM | POA: Insufficient documentation

## 2013-12-29 DIAGNOSIS — Z7982 Long term (current) use of aspirin: Secondary | ICD-10-CM | POA: Insufficient documentation

## 2013-12-29 DIAGNOSIS — Z87891 Personal history of nicotine dependence: Secondary | ICD-10-CM | POA: Insufficient documentation

## 2013-12-29 DIAGNOSIS — T8140XA Infection following a procedure, unspecified, initial encounter: Secondary | ICD-10-CM | POA: Insufficient documentation

## 2013-12-29 DIAGNOSIS — K429 Umbilical hernia without obstruction or gangrene: Secondary | ICD-10-CM | POA: Insufficient documentation

## 2013-12-29 DIAGNOSIS — Z95810 Presence of automatic (implantable) cardiac defibrillator: Secondary | ICD-10-CM | POA: Insufficient documentation

## 2013-12-29 DIAGNOSIS — R109 Unspecified abdominal pain: Secondary | ICD-10-CM

## 2013-12-29 DIAGNOSIS — Z9889 Other specified postprocedural states: Secondary | ICD-10-CM | POA: Insufficient documentation

## 2013-12-29 DIAGNOSIS — J45909 Unspecified asthma, uncomplicated: Secondary | ICD-10-CM | POA: Insufficient documentation

## 2013-12-29 DIAGNOSIS — E039 Hypothyroidism, unspecified: Secondary | ICD-10-CM | POA: Insufficient documentation

## 2013-12-29 DIAGNOSIS — I10 Essential (primary) hypertension: Secondary | ICD-10-CM | POA: Insufficient documentation

## 2013-12-29 DIAGNOSIS — Z792 Long term (current) use of antibiotics: Secondary | ICD-10-CM | POA: Insufficient documentation

## 2013-12-29 DIAGNOSIS — M129 Arthropathy, unspecified: Secondary | ICD-10-CM | POA: Insufficient documentation

## 2013-12-29 DIAGNOSIS — I5041 Acute combined systolic (congestive) and diastolic (congestive) heart failure: Secondary | ICD-10-CM | POA: Insufficient documentation

## 2013-12-29 DIAGNOSIS — Z79899 Other long term (current) drug therapy: Secondary | ICD-10-CM | POA: Insufficient documentation

## 2013-12-29 LAB — COMPREHENSIVE METABOLIC PANEL
ALT: 12 U/L (ref 0–53)
AST: 18 U/L (ref 0–37)
Albumin: 3.7 g/dL (ref 3.5–5.2)
Alkaline Phosphatase: 92 U/L (ref 39–117)
BUN: 15 mg/dL (ref 6–23)
CALCIUM: 9.7 mg/dL (ref 8.4–10.5)
CO2: 28 meq/L (ref 19–32)
CREATININE: 0.84 mg/dL (ref 0.50–1.35)
Chloride: 101 mEq/L (ref 96–112)
GFR calc non Af Amer: >90 mL/min (ref >90)
GLUCOSE: 81 mg/dL (ref 70–99)
Potassium: 3.9 mEq/L (ref 3.7–5.3)
Sodium: 141 mEq/L (ref 137–147)
TOTAL PROTEIN: 7.5 g/dL (ref 6.0–8.3)
Total Bilirubin: 0.4 mg/dL (ref 0.3–1.2)

## 2013-12-29 LAB — URINALYSIS, ROUTINE W REFLEX MICROSCOPIC
Bilirubin Urine: NEGATIVE
Glucose, UA: NEGATIVE mg/dL
HGB URINE DIPSTICK: NEGATIVE
Ketones, ur: NEGATIVE mg/dL
LEUKOCYTES UA: NEGATIVE
NITRITE: NEGATIVE
Protein, ur: NEGATIVE mg/dL
Specific Gravity, Urine: 1.022 (ref 1.005–1.030)
UROBILINOGEN UA: 0.2 mg/dL (ref 0.0–1.0)
pH: 8 (ref 5.0–8.0)

## 2013-12-29 LAB — CBC WITH DIFFERENTIAL/PLATELET
Basophils Absolute: 0 10*3/uL (ref 0.0–0.1)
Basophils Relative: 1 % (ref 0–1)
EOS ABS: 0.1 10*3/uL (ref 0.0–0.7)
EOS PCT: 1 % (ref 0–5)
HEMATOCRIT: 32.2 % — AB (ref 39.0–52.0)
Hemoglobin: 10.5 g/dL — ABNORMAL LOW (ref 13.0–17.0)
LYMPHS ABS: 1.6 10*3/uL (ref 0.7–4.0)
Lymphocytes Relative: 32 % (ref 12–46)
MCH: 25.8 pg — AB (ref 26.0–34.0)
MCHC: 32.6 g/dL (ref 30.0–36.0)
MCV: 79.1 fL (ref 78.0–100.0)
Monocytes Absolute: 0.5 10*3/uL (ref 0.1–1.0)
Monocytes Relative: 10 % (ref 3–12)
Neutro Abs: 2.9 10*3/uL (ref 1.7–7.7)
Neutrophils Relative %: 56 % (ref 43–77)
Platelets: 329 10*3/uL (ref 150–400)
RBC: 4.07 MIL/uL — AB (ref 4.22–5.81)
RDW: 16.8 % — ABNORMAL HIGH (ref 11.5–15.5)
WBC: 5.2 10*3/uL (ref 4.0–10.5)

## 2013-12-29 LAB — LIPASE, BLOOD: Lipase: 17 U/L (ref 11–59)

## 2013-12-29 MED ORDER — SODIUM CHLORIDE 0.9 % IV SOLN
Freq: Once | INTRAVENOUS | Status: AC
  Administered 2013-12-29: 21:00:00 via INTRAVENOUS

## 2013-12-29 MED ORDER — IOHEXOL 300 MG/ML  SOLN
100.0000 mL | Freq: Once | INTRAMUSCULAR | Status: AC | PRN
  Administered 2013-12-29: 100 mL via INTRAVENOUS

## 2013-12-29 NOTE — ED Provider Notes (Signed)
CSN: [id number]     Arrival date & time 12/29/13  1739 History   First MD Initiated Contact with Patient 12/29/13 1804     Chief Complaint  Patient presents with  . Abdominal Pain     (Consider location/radiation/quality/duration/timing/severity/associated sxs/prior Treatment) HPI Comments: 63 year old AA male presents with abdominal pain x 6 hours. Pt states that he first noticed the pain when he bent down to pick up a paper he had dropped on the floor. He suddenly felt a sharp "gas like" 7/10 pain in his left lower abdomen. He then noticed an umbilical mass that was firm in nature, painful and distended. He notes that his whole abdomen became "distended" and painful to the touch. Pt went home and rested for a few hours. He had recent spinal surgery (Dr. Christella Noa) with post-op infection requiring wound debridement and wound VAC which is still in place. His home care nurse directed him to the ER. Per patient, she noted absent bowel sounds, abdominal tenderness and an umbilical mass. Left upper and lower quadrant pain has now reduced to 5/10 and patient states that his umbilical mass has reduced "about 50%" in size, and is now less tender and firm. Patient has no history of GI disorders or abdominal surgeries. Last bowel movement was 1 day ago.   Patient is a 63 y.o. male presenting with abdominal pain.  Abdominal Pain Associated symptoms: constipation   Associated symptoms: no chills, no fever, no shortness of breath and no vomiting     Past Medical History  Diagnosis Date  . Allergy   . Asthma   . Hypertension   . Substance abuse   . Acute combined systolic and diastolic heart failure 9/38/1829  . Shortness of breath     with asthma  . Arthritis   . Cardiomyopathy   . Automatic implantable cardioverter-defibrillator in situ     portable defibrilator  . Complication of anesthesia     problems urinating after anesthesia  . Hypothyroidism    Past Surgical History  Procedure Laterality  Date  . Cardiac catheterization    . Anterior cervical decomp/discectomy fusion N/A 07/30/2013    Procedure: CERVICAL FIVE,CERVICAL SIX CORPECTOMY,ANTERIOR ARTHRODESIS,PEEK INTERBODY GRAFT CERVICAL FOUR-CERVICAL SEVEN,ANTERIOR INSTRUMENTATION;  Surgeon: Winfield Cunas, MD;  Location: [place] [facility];  Service: Neurosurgery;  Laterality: N/A;  . Lumbar wound debridement N/A 11/07/2013    Procedure: LUMBAR WOUND DEBRIDEMENT;  Surgeon: Winfield Cunas, MD;  Location: [place] [facility];  Service: Neurosurgery;  Laterality: N/A;   No family history on file. History  Substance Use Topics  . Smoking status: Former Smoker -- 20 years    Quit date: 08/14/1995  . Smokeless tobacco: Never Used  . Alcohol Use: No    Review of Systems  Constitutional: Negative for fever and chills.  HENT: Negative.   Respiratory: Negative.  Negative for shortness of breath.   Cardiovascular: Negative.   Gastrointestinal: Positive for abdominal pain and constipation. Negative for vomiting.  Musculoskeletal: Negative.  Negative for back pain.  Skin: Negative.   Neurological: Negative.       Allergies  Keflex  Home Medications   Current Outpatient Rx  Name  Route  Sig  Dispense  Refill  . acetaminophen (TYLENOL) 500 MG tablet   Oral   Take 1,000 mg by mouth 2 (two) times daily as needed (painh).          Marland Kitchen albuterol (PROVENTIL HFA;VENTOLIN HFA) 108 (90 BASE) MCG/ACT inhaler   Inhalation  Inhale 2 puffs into the lungs every 6 (six) hours as needed for wheezing.         Marland Kitchen aspirin EC 81 MG tablet   Oral   Take 81 mg by mouth 2 (two) times daily.         Marland Kitchen b complex vitamins tablet   Oral   Take 1 tablet by mouth daily.         . carvedilol (COREG) 12.5 MG tablet   Oral   Take 12.5 mg by mouth 2 (two) times daily with a meal.         . digoxin (LANOXIN) 0.25 MG tablet   Oral   Take 0.25 mg by mouth daily.         Marland Kitchen doxycycline (VIBRAMYCIN) 50 MG capsule   Oral   Take 2 capsules (100 mg  total) by mouth 2 (two) times daily.   120 capsule   1   . furosemide (LASIX) 20 MG tablet   Oral   Take 1 tablet (20 mg total) by mouth daily.   30 tablet      . HYDROmorphone (DILAUDID) 2 MG tablet   Oral   Take 1 tablet (2 mg total) by mouth every 4 (four) hours as needed for severe pain.   80 tablet   0   . isosorbide-hydrALAZINE (BIDIL) 20-37.5 MG per tablet   Oral   Take 1 tablet by mouth 2 (two) times daily.   90 tablet   2   . levothyroxine (SYNTHROID, LEVOTHROID) 200 MCG tablet   Oral   Take 200 mcg by mouth daily before breakfast.         . Multiple Vitamin (MULTIVITAMIN WITH MINERALS) TABS   Oral   Take 1 tablet by mouth daily.         . rifampin (RIFADIN) 300 MG capsule   Oral   Take 2 capsules (600 mg total) by mouth daily.   60 capsule   0   . senna-docusate (SENOKOT-S) 8.6-50 MG per tablet   Oral   Take 1 tablet by mouth at bedtime as needed for mild constipation.         . tamsulosin (FLOMAX) 0.4 MG CAPS capsule   Oral   Take 1 capsule (0.4 mg total) by mouth daily after supper.   30 capsule   1    BP 181/100  Pulse 79  Temp(Src) 98.1 F (36.7 C) (Oral)  Resp 16  SpO2 99% Physical Exam  Constitutional: He appears well-developed and well-nourished.  HENT:  Head: Normocephalic.  Neck: Normal range of motion. Neck supple.  Cardiovascular: Normal rate and regular rhythm.   Pulmonary/Chest: Effort normal and breath sounds normal.  Abdominal: Soft. Bowel sounds are normal. There is tenderness. There is no rebound and no guarding.  Small umbilical hernia that is soft, moderately tender. Soft abdomen otherwise, with bowel sounds throughout.   Musculoskeletal: Normal range of motion.  Neurological: He is alert. No cranial nerve deficit.  Skin: Skin is warm and dry. No rash noted.  Psychiatric: He has a normal mood and affect.    ED Course  Procedures (including critical care time) Labs Review Labs Reviewed  CBC WITH DIFFERENTIAL   COMPREHENSIVE METABOLIC PANEL  LIPASE, BLOOD  URINALYSIS, ROUTINE W REFLEX MICROSCOPIC   Imaging Review No results found.   EKG Interpretation None      MDM   Final diagnoses:  None    1. Abdominal pain 2. Umbilical hernia without incarceration  He reports abdominal swelling and pain decreased on arrival to ED. Exam supports painful umbilical hernia that is small. CT shows no incarceration of visualized umbilical hernia. Lab studies reassuring. He can be discharged home to follow up with Dr. [name] outpatient. Return precautions discussed.     Dewaine Oats, PA-C 12/29/13 2251

## 2013-12-29 NOTE — ED Notes (Signed)
Bed: WA14 Expected date:  Expected time:  Means of arrival:  Comments: ems 

## 2013-12-29 NOTE — Discharge Instructions (Signed)
Abdominal Pain, Adult Many things can cause belly (abdominal) pain. Most times, the belly pain is not dangerous. Many cases of belly pain can be watched and treated at home. HOME CARE   Do not take medicines that help you go poop (laxatives) unless told to by your doctor.  Only take medicine as told by your doctor.  Eat or drink as told by your doctor. Your doctor will tell you if you should be on a special diet. GET HELP IF:  You do not know what is causing your belly pain.  You have belly pain while you are sick to your stomach (nauseous) or have runny poop (diarrhea).  You have pain while you pee or poop.  Your belly pain wakes you up at night.  You have belly pain that gets worse or better when you eat.  You have belly pain that gets worse when you eat fatty foods. GET HELP RIGHT AWAY IF:   The pain does not go away within 2 hours.  You have a fever.  You keep throwing up (vomiting).  The pain changes and is only in the right or left part of the belly.  You have bloody or tarry looking poop. MAKE SURE YOU:   Understand these instructions.  Will watch your condition.  Will get help right away if you are not doing well or get worse. Document Released: 02/28/2008 Document Revised: 07/02/2013 Document Reviewed: 05/21/2013 [facility] Patient Information 2014 [place]. Ventral Hernia A ventral hernia (also called an incisional hernia) is a hernia that occurs at the site of a previous surgical cut (incision) in the abdomen. The abdominal wall spans from your lower chest down to your pelvis. If the abdominal wall is weakened from a surgical incision, a hernia can occur. A hernia is a bulge of bowel or muscle tissue pushing out on the weakened part of the abdominal wall. Ventral hernias can get bigger from straining or lifting. Obese and older people are at higher risk for a ventral hernia. People who develop infections after surgery or require repeat incisions at the same  site on the abdomen are also at increased risk. CAUSES  A ventral hernia occurs because of weakness in the abdominal wall at an incision site.  SYMPTOMS  Common symptoms include:  A visible bulge or lump on the abdominal wall.  Pain or tenderness around the lump.  Increased discomfort if you cough or make a sudden movement. If the hernia has blocked part of the intestine, a serious complication can occur (incarcerated or strangulated hernia). This can become a problem that requires emergency surgery because the blood flow to the blocked intestine may be cut off. Symptoms may include:  Feeling sick to your stomach (nauseous).  Throwing up (vomiting).  Stomach swelling (distention) or bloating.  Fever.  Rapid heartbeat. DIAGNOSIS  Your caregiver will take a medical history and perform a physical exam. Various tests may be ordered, such as:  Blood tests.  Urine tests.  Ultrasonography.  X-rays.  Computed tomography (CT). TREATMENT  Watchful waiting may be all that is needed for a smaller hernia that does not cause symptoms. Your caregiver may recommend the use of a supportive belt (truss) that helps to keep the abdominal wall intact. For larger hernias or those that cause pain, surgery to repair the hernia is usually recommended. If a hernia becomes strangulated, emergency surgery needs to be done right away. HOME CARE INSTRUCTIONS  Avoid putting pressure or strain on the abdominal area.  Avoid  heavy lifting.  Use good body positioning for physical tasks. Ask your caregiver about proper body positioning.  Use a supportive belt as directed by your caregiver.  Maintain a healthy weight.  Eat foods that are high in fiber, such as whole grains, fruits, and vegetables. Fiber helps prevent difficult bowel movements (constipation).  Drink enough fluids to keep your urine clear or pale yellow.  Follow up with your caregiver as directed. SEEK MEDICAL CARE IF:   Your hernia  seems to be getting larger or more painful. SEEK IMMEDIATE MEDICAL CARE IF:   You have abdominal pain that is sudden and sharp.  Your pain becomes severe.  You have repeated vomiting.  You are sweating a lot.  You notice a rapid heartbeat.  You develop a fever. MAKE SURE YOU:   Understand these instructions.  Will watch your condition.  Will get help right away if you are not doing well or get worse. Document Released: 08/28/2012 Document Reviewed: 08/28/2012 [facility] Patient Information 2014 [place], [place].  Results for orders placed during the hospital encounter of 12/29/13  CBC WITH DIFFERENTIAL      Result Value Ref Range   WBC 5.2  4.0 - 10.5 K/uL   RBC 4.07 (*) 4.22 - 5.81 MIL/uL   Hemoglobin 10.5 (*) 13.0 - 17.0 g/dL   HCT 32.2 (*) 39.0 - 52.0 %   MCV 79.1  78.0 - 100.0 fL   MCH 25.8 (*) 26.0 - 34.0 pg   MCHC 32.6  30.0 - 36.0 g/dL   RDW 16.8 (*) 11.5 - 15.5 %   Platelets 329  150 - 400 K/uL   Neutrophils Relative % 56  43 - 77 %   Neutro Abs 2.9  1.7 - 7.7 K/uL   Lymphocytes Relative 32  12 - 46 %   Lymphs Abs 1.6  0.7 - 4.0 K/uL   Monocytes Relative 10  3 - 12 %   Monocytes Absolute 0.5  0.1 - 1.0 K/uL   Eosinophils Relative 1  0 - 5 %   Eosinophils Absolute 0.1  0.0 - 0.7 K/uL   Basophils Relative 1  0 - 1 %   Basophils Absolute 0.0  0.0 - 0.1 K/uL  COMPREHENSIVE METABOLIC PANEL      Result Value Ref Range   Sodium 141  137 - 147 mEq/L   Potassium 3.9  3.7 - 5.3 mEq/L   Chloride 101  96 - 112 mEq/L   CO2 28  19 - 32 mEq/L   Glucose, Bld 81  70 - 99 mg/dL   BUN 15  6 - 23 mg/dL   Creatinine, Ser 0.84  0.50 - 1.35 mg/dL   Calcium 9.7  8.4 - 10.5 mg/dL   Total Protein 7.5  6.0 - 8.3 g/dL   Albumin 3.7  3.5 - 5.2 g/dL   AST 18  0 - 37 U/L   ALT 12  0 - 53 U/L   Alkaline Phosphatase 92  39 - 117 U/L   Total Bilirubin 0.4  0.3 - 1.2 mg/dL   GFR calc non Af Amer >90  >90 mL/min   GFR calc Af Amer >90  >90 mL/min  LIPASE, BLOOD      Result Value Ref  Range   Lipase 17  11 - 59 U/L  URINALYSIS, ROUTINE W REFLEX MICROSCOPIC      Result Value Ref Range   Color, Urine YELLOW  YELLOW   APPearance CLEAR  CLEAR   Specific Gravity, Urine  1.022  1.005 - 1.030   pH 8.0  5.0 - 8.0   Glucose, UA NEGATIVE  NEGATIVE mg/dL   Hgb urine dipstick NEGATIVE  NEGATIVE   Bilirubin Urine NEGATIVE  NEGATIVE   Ketones, ur NEGATIVE  NEGATIVE mg/dL   Protein, ur NEGATIVE  NEGATIVE mg/dL   Urobilinogen, UA 0.2  0.0 - 1.0 mg/dL   Nitrite NEGATIVE  NEGATIVE   Leukocytes, UA NEGATIVE  NEGATIVE   Ct Abdomen Pelvis W Contrast  12/29/2013   CLINICAL DATA:  Sharp mid abdominal pain, with umbilical protrusion.  EXAM: CT ABDOMEN AND PELVIS WITH CONTRAST  TECHNIQUE: Multidetector CT imaging of the abdomen and pelvis was performed using the standard protocol following bolus administration of intravenous contrast.  CONTRAST:  155mL OMNIPAQUE IOHEXOL 300 MG/ML  SOLN  COMPARISON:  Lumbar spine radiographs performed 12/04/2013  FINDINGS: The visualized lung bases are clear.  A 6 mm hypodensity at the right hepatic lobe is nonspecific but may reflect a small cyst. The liver is otherwise unremarkable. The spleen is within normal limits. The gallbladder is within normal limits. The pancreas and adrenal glands are unremarkable.  A 2.4 cm left renal cyst is noted; scattered smaller left renal cysts are also seen. The right kidney is unremarkable in appearance. There is no evidence of hydronephrosis. No renal or ureteral stones are seen. No perinephric stranding is appreciated.  No free fluid is identified. The small bowel is unremarkable in appearance. The stomach is within normal limits. No acute vascular abnormalities are seen. Mild calcification is seen along the proximal right common iliac artery.  A small umbilical hernia seen, containing trace fluid and demonstrating mild soft tissue inflammation. There is no evidence of bowel herniation.  The appendix is normal in caliber and contains  air, without evidence for appendicitis. A few scattered diverticula are seen along the ascending colon, and additional diverticulosis is seen along the descending and proximal sigmoid colon, without evidence of diverticulitis.  The bladder is moderately distended and grossly unremarkable in appearance. The prostate is borderline normal in size. No inguinal lymphadenopathy is seen.  No acute osseous abnormalities are identified. The patient is status post posterior lumbar spinal fusion at L4-L5, with associated decompression. There is chronic grade 1 anterolisthesis of L4 on L5. Vacuum phenomenon and disc space narrowing are seen at L5-S1, with associated endplate degenerative change.  A soft tissue wound is noted at the midline lower back, at the surgical site, with mild associated soft tissue edema.  IMPRESSION: 1. Umbilical protrusion reflects a small umbilical hernia, containing fat and trace fluid, with mild associated soft tissue inflammation. No evidence of bowel herniation. 2. Soft tissue wound at the midline lower back, at the level of the surgical site, with mild associated soft tissue edema. 3. Small left renal cysts and possible small hepatic cyst seen. 4. Scattered diverticulosis along the descending and proximal sigmoid colon, and minimal diverticulosis along the ascending colon.   Electronically Signed   By: Garald Balding M.D.   On: 12/29/2013 22:03

## 2013-12-29 NOTE — ED Provider Notes (Signed)
Medical screening examination/treatment/procedure(s) were conducted as a shared visit with non-physician practitioner(s) and myself.  I personally evaluated the patient during the encounter.   EKG Interpretation None       Patient here with umbilical hernia. Self-reduced while waiting exam. On my exam, slightly tender umbilicus with swelling, soft. No hernia incarceration or strangulation appreciated. Stable for discharge.  Osvaldo Shipper, MD 12/29/13 2352

## 2013-12-29 NOTE — ED Notes (Signed)
Per EMS pt was out shopping today, around noon felt a sudden severe abdominal pain, like gas, but never passed gas, went home laid down, couple hours later noticed a protrusion at belly button, the home health nurse said it was a cyst, painful to touch site, last BM was yesterday, was normal, no straining.

## 2013-12-29 NOTE — ED Notes (Signed)
Pt reports that the pain has subsided into a tenderness.

## 2014-01-01 ENCOUNTER — Encounter: Payer: Self-pay | Admitting: Internal Medicine

## 2014-01-08 ENCOUNTER — Ambulatory Visit (INDEPENDENT_AMBULATORY_CARE_PROVIDER_SITE_OTHER): Payer: 59 | Admitting: Internal Medicine

## 2014-01-08 ENCOUNTER — Encounter: Payer: Self-pay | Admitting: Internal Medicine

## 2014-01-08 VITALS — BP 87/60 | HR 103 | Temp 97.8°F | Ht 74.0 in | Wt 207.5 lb

## 2014-01-08 DIAGNOSIS — T8149XA Infection following a procedure, other surgical site, initial encounter: Secondary | ICD-10-CM

## 2014-01-08 DIAGNOSIS — T8140XA Infection following a procedure, unspecified, initial encounter: Secondary | ICD-10-CM

## 2014-01-08 LAB — C-REACTIVE PROTEIN

## 2014-01-08 LAB — SEDIMENTATION RATE: Sed Rate: 7 mm/hr (ref 0–16)

## 2014-01-08 NOTE — Progress Notes (Signed)
Patient ID: Peter [name], male   DOB: 04/12/1951, 63 y.o.   MRN: [id number]         [facility] for Infectious Disease  Patient Active Problem List   Diagnosis Date Noted  . Hypothyroid 11/12/2013  . Normocytic anemia 11/12/2013  . Wound infection after surgery 11/07/2013  . Spondylolisthesis of lumbar region 10/22/2013  . Dizziness and giddiness 08/13/2013    Class: Acute  . Acute renal insufficiency 08/13/2013    Class: Acute  . Spondylosis, cervical, with myelopathy 08/01/2013  . Weakness 06/03/2013  . Acute combined systolic and diastolic heart failure 37/62/8315  . Volume overload 03/11/2013  . HTN (hypertension) 03/11/2013  . Abnormal CK 03/11/2013  . DOE (dyspnea on exertion) 03/11/2013  . Unspecified hypothyroidism 03/11/2013    Patient's Medications  New Prescriptions   No medications on file  Previous Medications   ACETAMINOPHEN (TYLENOL) 500 MG TABLET    Take 1,000 mg by mouth 2 (two) times daily as needed (painh).    ALBUTEROL (PROVENTIL HFA;VENTOLIN HFA) 108 (90 BASE) MCG/ACT INHALER    Inhale 2 puffs into the lungs every 6 (six) hours as needed for wheezing.   ASPIRIN EC 81 MG TABLET    Take 81 mg by mouth 2 (two) times daily.   B COMPLEX VITAMINS TABLET    Take 1 tablet by mouth daily.   CARVEDILOL (COREG) 12.5 MG TABLET    Take 12.5 mg by mouth 2 (two) times daily with a meal.   DIGOXIN (LANOXIN) 0.25 MG TABLET    Take 0.25 mg by mouth daily.   DOXYCYCLINE (VIBRAMYCIN) 50 MG CAPSULE    Take 2 capsules (100 mg total) by mouth 2 (two) times daily.   FUROSEMIDE (LASIX) 20 MG TABLET    Take 1 tablet (20 mg total) by mouth daily.   HYDROMORPHONE (DILAUDID) 2 MG TABLET    Take 1 tablet (2 mg total) by mouth every 4 (four) hours as needed for severe pain.   ISOSORBIDE-HYDRALAZINE (BIDIL) 20-37.5 MG PER TABLET    Take 1 tablet by mouth 2 (two) times daily.   LEVOTHYROXINE (SYNTHROID, LEVOTHROID) 200 MCG TABLET    Take 200 mcg by mouth daily before breakfast.     MULTIPLE VITAMIN (MULTIVITAMIN WITH MINERALS) TABS    Take 1 tablet by mouth daily.   RIFAMPIN (RIFADIN) 300 MG CAPSULE    Take 2 capsules (600 mg total) by mouth daily.   SENNA-DOCUSATE (SENOKOT-S) 8.6-50 MG PER TABLET    Take 1 tablet by mouth at bedtime as needed for mild constipation.   TAMSULOSIN (FLOMAX) 0.4 MG CAPS CAPSULE    Take 1 capsule (0.4 mg total) by mouth daily after supper.  Modified Medications   No medications on file  Discontinued Medications   No medications on file    Subjective: Peter [name] in for his routine followup visit. He has now completed 61 days of total antibiotic therapy for his MSSA postoperative lumbar infection. He is currently on oral doxycycline and rifampin. His pain is improving and he is requiring less dilaudid for breakthrough pain. He's had no problems tolerating his antibiotics. His vac wound dressing was removed yesterday.  Review of Systems: Pertinent items are noted in HPI.  Past Medical History  Diagnosis Date  . Allergy   . Asthma   . Hypertension   . Substance abuse   . Acute combined systolic and diastolic heart failure 1/76/1607  . Shortness of breath     with asthma  . Arthritis   .  Cardiomyopathy   . Automatic implantable cardioverter-defibrillator in situ     portable defibrilator  . Complication of anesthesia     problems urinating after anesthesia  . Hypothyroidism     History  Substance Use Topics  . Smoking status: Former Smoker -- 20 years    Quit date: 08/14/1995  . Smokeless tobacco: Never Used  . Alcohol Use: No    No family history on file.  Allergies  Allergen Reactions  . Keflex [Cephalexin] Anaphylaxis    Objective: Temp: 97.8 F (36.6 C) (04/16 0941) Temp src: Oral (04/16 0941) BP: 87/60 mmHg (04/16 0941) Pulse Rate: 103 (04/16 0941)  General: He is accompanied by his wife. He is talkative and in good spirits. His lumbar incision remains open. It is about 3 inches in length and about an  inch deep. There is 100% good granulation tissue with no drainage or odor  Lab Results Lab Results  Component Value Date   CRP 0.8* 12/09/2013   Lab Results  Component Value Date   ESRSEDRATE 36* 12/09/2013     Assessment: He is improving steadily. It will take some more time for his wound to fully heal but his infection may be cured. I will repeat his inflammatory markers and then make a decision about duration of antibiotic therapy.  Plan: 1. Repeat sedimentation rate and C-reactive protein 2. Continue antibiotics for now. I will followup by phone tomorrow   Peter Bickers, MD [place] for [place] [phone] pager   [phone] cell 01/08/2014, 9:50 AM

## 2014-01-19 ENCOUNTER — Telehealth: Payer: Self-pay | Admitting: *Deleted

## 2014-01-19 NOTE — Telephone Encounter (Signed)
Please tell him I am sorry that I forgot to call him. His inflammatory markers (sed rate and C reactive protein) have returned to normal. He can stop both antibiotics (doxycycline and rifampin) now.

## 2014-01-19 NOTE — Telephone Encounter (Signed)
Notified patient. He is relieved and thankful! Landis Gandy, RN

## 2014-01-19 NOTE — Addendum Note (Signed)
Addended by: Michel Bickers on: 01/19/2014 11:34 AM   Modules accepted: Orders, Medications

## 2014-01-19 NOTE — Telephone Encounter (Signed)
Per Dr Megan Salon called the patient to advise him to stop taking the rifampin and Doxy that his labs came back within normal range. Had to leave a message for him to but advised him to call the office with any questions. Also advised him to keep his 01/29/14 appt.

## 2014-01-19 NOTE — Telephone Encounter (Signed)
Does pt need to continue antibiotics?  Pt is curious about lab results and continuing antibiotics.  Phone number [phone].  MD please advise.

## 2014-01-29 ENCOUNTER — Ambulatory Visit: Payer: 59 | Admitting: Internal Medicine

## 2014-01-30 ENCOUNTER — Ambulatory Visit: Payer: 59 | Attending: Neurosurgery | Admitting: Physical Therapy

## 2014-01-30 DIAGNOSIS — IMO0001 Reserved for inherently not codable concepts without codable children: Secondary | ICD-10-CM | POA: Insufficient documentation

## 2014-01-30 DIAGNOSIS — M6281 Muscle weakness (generalized): Secondary | ICD-10-CM | POA: Insufficient documentation

## 2014-01-30 DIAGNOSIS — R269 Unspecified abnormalities of gait and mobility: Secondary | ICD-10-CM | POA: Insufficient documentation

## 2014-02-02 ENCOUNTER — Ambulatory Visit: Payer: 59 | Admitting: Physical Therapy

## 2014-02-04 ENCOUNTER — Ambulatory Visit: Payer: 59 | Admitting: Physical Therapy

## 2014-02-09 ENCOUNTER — Ambulatory Visit: Payer: 59 | Admitting: Physical Therapy

## 2014-02-11 ENCOUNTER — Encounter: Payer: Self-pay | Admitting: Internal Medicine

## 2014-02-11 ENCOUNTER — Ambulatory Visit (INDEPENDENT_AMBULATORY_CARE_PROVIDER_SITE_OTHER): Payer: 59 | Admitting: Internal Medicine

## 2014-02-11 ENCOUNTER — Ambulatory Visit: Payer: 59 | Admitting: Physical Therapy

## 2014-02-11 VITALS — BP 143/86 | HR 89 | Temp 98.0°F | Wt 214.5 lb

## 2014-02-11 DIAGNOSIS — T8140XA Infection following a procedure, unspecified, initial encounter: Secondary | ICD-10-CM

## 2014-02-11 DIAGNOSIS — T8149XA Infection following a procedure, other surgical site, initial encounter: Secondary | ICD-10-CM

## 2014-02-11 NOTE — Progress Notes (Signed)
Patient ID: Peter [name], male   DOB: 1950/12/19, 63 y.o.   MRN: [id number]         [facility] for Infectious Disease  Patient Active Problem List   Diagnosis Date Noted  . Hypothyroid 11/12/2013  . Normocytic anemia 11/12/2013  . Wound infection after surgery 11/07/2013  . Spondylolisthesis of lumbar region 10/22/2013  . Dizziness and giddiness 08/13/2013    Class: Acute  . Acute renal insufficiency 08/13/2013    Class: Acute  . Spondylosis, cervical, with myelopathy 08/01/2013  . Weakness 06/03/2013  . Acute combined systolic and diastolic heart failure 62/37/6283  . Volume overload 03/11/2013  . HTN (hypertension) 03/11/2013  . Abnormal CK 03/11/2013  . DOE (dyspnea on exertion) 03/11/2013  . Unspecified hypothyroidism 03/11/2013    Patient's Medications  New Prescriptions   No medications on file  Previous Medications   ACETAMINOPHEN (TYLENOL) 500 MG TABLET    Take 1,000 mg by mouth 2 (two) times daily as needed (painh).    ALBUTEROL (PROVENTIL HFA;VENTOLIN HFA) 108 (90 BASE) MCG/ACT INHALER    Inhale 2 puffs into the lungs every 6 (six) hours as needed for wheezing.   ASPIRIN EC 81 MG TABLET    Take 81 mg by mouth 2 (two) times daily.   B COMPLEX VITAMINS TABLET    Take 1 tablet by mouth daily.   CARVEDILOL (COREG) 12.5 MG TABLET    Take 12.5 mg by mouth 2 (two) times daily with a meal.   DIGOXIN (LANOXIN) 0.25 MG TABLET    Take 0.25 mg by mouth daily.   FUROSEMIDE (LASIX) 20 MG TABLET    Take 1 tablet (20 mg total) by mouth daily.   HYDROMORPHONE (DILAUDID) 2 MG TABLET    Take 1 tablet (2 mg total) by mouth every 4 (four) hours as needed for severe pain.   ISOSORBIDE-HYDRALAZINE (BIDIL) 20-37.5 MG PER TABLET    Take 1 tablet by mouth 2 (two) times daily.   LEVOTHYROXINE (SYNTHROID, LEVOTHROID) 200 MCG TABLET    Take 200 mcg by mouth daily before breakfast.   MULTIPLE VITAMIN (MULTIVITAMIN WITH MINERALS) TABS    Take 1 tablet by mouth daily.   SENNA-DOCUSATE  (SENOKOT-S) 8.6-50 MG PER TABLET    Take 1 tablet by mouth at bedtime as needed for mild constipation.   TAMSULOSIN (FLOMAX) 0.4 MG CAPS CAPSULE    Take 1 capsule (0.4 mg total) by mouth daily after supper.  Modified Medications   No medications on file  Discontinued Medications   No medications on file    Subjective: Peter [name] is in for his regular followup visit with his wife. He has been off of all antibiotics since April 27 after completing 72 days of total therapy for his MSSA lumbar infection. He is feeling a little bit better. He has restarted physical therapy. He admits that he has been feeling somewhat depressed and lazy and this keeps him from being as active as he could. He feels like his pain is improving but he is still taking 5 dilaudid daily. His wife states that his lumbar wound is slowly decreasing in size.  Review of Systems: Pertinent items are noted in HPI.  Past Medical History  Diagnosis Date  . Allergy   . Asthma   . Hypertension   . Substance abuse   . Acute combined systolic and diastolic heart failure 1/51/7616  . Shortness of breath     with asthma  . Arthritis   . Cardiomyopathy   .  Automatic implantable cardioverter-defibrillator in situ     portable defibrilator  . Complication of anesthesia     problems urinating after anesthesia  . Hypothyroidism     History  Substance Use Topics  . Smoking status: Former Smoker -- 20 years    Quit date: 08/14/1995  . Smokeless tobacco: Never Used  . Alcohol Use: No    No family history on file.  Allergies  Allergen Reactions  . Keflex [Cephalexin] Anaphylaxis    Objective: Temp: 98 F (36.7 C) (05/20 0950) Temp src: Oral (05/20 0950) BP: 143/86 mmHg (05/20 0950) Pulse Rate: 89 (05/20 0950)  General: He is in very good spirits as usual His lumbar incision is improving. It is decreased in size and is only about 2 cm deep. There is a slight amount of yellow drainage on the gauze dressing. He  walks with the assistance of a cane.  Lab Results Sed Rate (mm/hr)  Date Value  01/08/2014 7   12/09/2013 36*  11/12/2013 86*     CRP (mg/dL)  Date Value  01/08/2014 <0.5   12/09/2013 0.8*  11/12/2013 3.6*     Assessment: He is improving slowly. His inflammatory markers have returned to normal. I will continue observation off of antibiotics. I have encouraged him to talk to his physical therapist about a specific plan he can follow along days that he is at home to help motivate him.  Plan: 1. Continue observation off of antibiotics 2. Continue physical therapy 3. Followup in 4 weeks   Michel Bickers, MD [facility] for [place] [phone] pager   [phone] cell 02/11/2014, 10:17 AM

## 2014-02-17 ENCOUNTER — Ambulatory Visit: Payer: 59 | Admitting: Physical Therapy

## 2014-02-19 ENCOUNTER — Ambulatory Visit: Payer: 59 | Admitting: Physical Therapy

## 2014-02-20 ENCOUNTER — Ambulatory Visit (INDEPENDENT_AMBULATORY_CARE_PROVIDER_SITE_OTHER): Payer: Self-pay

## 2014-02-20 ENCOUNTER — Ambulatory Visit (INDEPENDENT_AMBULATORY_CARE_PROVIDER_SITE_OTHER): Payer: 59 | Admitting: Neurology

## 2014-02-20 DIAGNOSIS — M4316 Spondylolisthesis, lumbar region: Secondary | ICD-10-CM

## 2014-02-20 DIAGNOSIS — M4310 Spondylolisthesis, site unspecified: Secondary | ICD-10-CM

## 2014-02-20 DIAGNOSIS — R531 Weakness: Secondary | ICD-10-CM

## 2014-02-20 DIAGNOSIS — R5381 Other malaise: Secondary | ICD-10-CM

## 2014-02-20 DIAGNOSIS — M4712 Other spondylosis with myelopathy, cervical region: Secondary | ICD-10-CM

## 2014-02-20 DIAGNOSIS — Z0289 Encounter for other administrative examinations: Secondary | ICD-10-CM

## 2014-02-20 DIAGNOSIS — G5600 Carpal tunnel syndrome, unspecified upper limb: Secondary | ICD-10-CM

## 2014-02-20 DIAGNOSIS — R5383 Other fatigue: Secondary | ICD-10-CM

## 2014-02-20 NOTE — Procedures (Signed)
   NCS (NERVE CONDUCTION STUDY) WITH EMG (ELECTROMYOGRAPHY) REPORT   STUDY DATE: May 29th 2015 PATIENT NAME: Peter [name] DOB: 1951/02/12 MRN: [id number]    TECHNOLOGIST: Laretta Alstrom ELECTROMYOGRAPHER: Marcial Pacas M.D.  CLINICAL INFORMATION:  63 years old gentleman, with progressive bilateral upper and lower extremity muscle wasting, weakness, had underwent cervical, lumbar decompression surgery, with persistent upper and lower extremity weakness, right worse than left On examination: Shoulder abduction 3/4, elbow flexion 3/4, elbow extension 4 plus/3, wrist flexion 4/4, wrist extension 3/4, hip-flexion 4/4, knee flexion 5/5, ankle dorsiflexion 4/5 minus. Bilateral upper extremity hyporeflexia, and brisk reflexes at bilateral lower extremity  FINDINGS: NERVE CONDUCTION STUDY: Bilateral ulnar sensory and motor responses were normal. Bilateral median sensory responses showed mild to moderately prolonged peak latency, with normal snap amplitude. Bilateral median motor responses showed mild to moderately prolonged distal latency, severely decreased CMAP amplitude, ,prolonged F-wave latency, with normal conduction velocity.  NEEDLE ELECTROMYOGRAPHY: Selected needle examination was performed at right upper extremity muscles, right lower extremity muscles, and right cervical paraspinals.  Right first dorsl interossei, extensor digitorum communis, biceps, triceps,: Increased insertion activity, 1-2 plus spontaneous activity, complex enlarged motor unit potential, with decreased recruitment patterns.  Right cervical paraspinals, increased insertion activity, but I do not see any spontaneous activity  Right tibialis posterior, tibialis anterior, peroneal longus, increased insertion activity, 1 plus spontaneous activity, enlarged complex motor unit potential, with decreased recruitment patterns.  Right vastus lateralis, increased insertion activity, no spontaneous activity, Enlarged complex  motor unit potential, with decreased recruitment patterns.   IMPRESSION:   This is an abnormal study. There is electrodiagnostic evidence of active neuropathic changes involving right cervical,  lumbar myotomes, consistent with his previous diagnosis of lumbar, and cervical stenosis, in addition, there is also evidence of severe bilateral carpal tunnel syndromes. In comparison with previous study in September 2014, there was no significant changes, definitely no worsening.    INTERPRETING PHYSICIAN:   Marcial Pacas M.D. Ph.D. [facility] [address], [place], [place] [address] [phone]

## 2014-02-23 ENCOUNTER — Ambulatory Visit: Payer: 59 | Attending: Neurosurgery | Admitting: Physical Therapy

## 2014-02-23 DIAGNOSIS — M6281 Muscle weakness (generalized): Secondary | ICD-10-CM | POA: Insufficient documentation

## 2014-02-23 DIAGNOSIS — R269 Unspecified abnormalities of gait and mobility: Secondary | ICD-10-CM | POA: Diagnosis not present

## 2014-02-23 DIAGNOSIS — IMO0001 Reserved for inherently not codable concepts without codable children: Secondary | ICD-10-CM | POA: Insufficient documentation

## 2014-02-25 ENCOUNTER — Ambulatory Visit: Payer: 59 | Admitting: Physical Therapy

## 2014-02-25 DIAGNOSIS — IMO0001 Reserved for inherently not codable concepts without codable children: Secondary | ICD-10-CM | POA: Diagnosis not present

## 2014-03-03 ENCOUNTER — Ambulatory Visit: Payer: 59 | Admitting: Physical Therapy

## 2014-03-03 DIAGNOSIS — IMO0001 Reserved for inherently not codable concepts without codable children: Secondary | ICD-10-CM | POA: Diagnosis not present

## 2014-03-05 ENCOUNTER — Ambulatory Visit: Payer: 59 | Admitting: Physical Therapy

## 2014-03-05 DIAGNOSIS — IMO0001 Reserved for inherently not codable concepts without codable children: Secondary | ICD-10-CM | POA: Diagnosis not present

## 2014-03-09 ENCOUNTER — Ambulatory Visit: Payer: 59 | Admitting: Physical Therapy

## 2014-03-09 DIAGNOSIS — IMO0001 Reserved for inherently not codable concepts without codable children: Secondary | ICD-10-CM | POA: Diagnosis not present

## 2014-03-11 ENCOUNTER — Ambulatory Visit: Payer: 59 | Admitting: Internal Medicine

## 2014-03-12 ENCOUNTER — Ambulatory Visit: Payer: 59 | Admitting: Physical Therapy

## 2014-03-16 ENCOUNTER — Ambulatory Visit: Payer: 59 | Admitting: Physical Therapy

## 2014-03-16 DIAGNOSIS — IMO0001 Reserved for inherently not codable concepts without codable children: Secondary | ICD-10-CM | POA: Diagnosis not present

## 2014-03-18 ENCOUNTER — Other Ambulatory Visit: Payer: Self-pay | Admitting: Orthopedic Surgery

## 2014-03-18 ENCOUNTER — Ambulatory Visit: Payer: 59 | Admitting: Physical Therapy

## 2014-03-18 DIAGNOSIS — IMO0001 Reserved for inherently not codable concepts without codable children: Secondary | ICD-10-CM | POA: Diagnosis not present

## 2014-03-19 ENCOUNTER — Encounter (HOSPITAL_BASED_OUTPATIENT_CLINIC_OR_DEPARTMENT_OTHER): Payer: Self-pay | Admitting: *Deleted

## 2014-03-19 ENCOUNTER — Other Ambulatory Visit: Payer: Self-pay | Admitting: Orthopedic Surgery

## 2014-03-19 NOTE — Progress Notes (Signed)
Pt had cardiomyopathy-ef was down 10-now better-did not have to have ICD put in-will come in for bmet-called for dr Nadyne Coombes notes-very nice man

## 2014-03-19 NOTE — Progress Notes (Signed)
03/19/14 0945  OBSTRUCTIVE SLEEP APNEA  Have you ever been diagnosed with sleep apnea through a sleep study? No  Do you snore loudly (loud enough to be heard through closed doors)?  0  Do you often feel tired, fatigued, or sleepy during the daytime? 0  Has anyone observed you stop breathing during your sleep? 0  Do you have, or are you being treated for high blood pressure? 1  BMI more than 35 kg/m2? 0  Age over 62 years old? 1  Neck circumference greater than 40 cm/16 inches? 1  Gender: 1  Obstructive Sleep Apnea Score 4  Score 4 or greater  Results sent to PCP

## 2014-03-20 ENCOUNTER — Encounter (HOSPITAL_BASED_OUTPATIENT_CLINIC_OR_DEPARTMENT_OTHER)
Admission: RE | Admit: 2014-03-20 | Discharge: 2014-03-20 | Disposition: A | Discharge status: Home / Self Care | Payer: 59 | Source: Ambulatory Visit | Attending: Orthopedic Surgery | Admitting: Orthopedic Surgery

## 2014-03-20 LAB — BASIC METABOLIC PANEL
BUN: 10 mg/dL (ref 6–23)
CALCIUM: 9.5 mg/dL (ref 8.4–10.5)
CO2: 26 mEq/L (ref 19–32)
Chloride: 100 mEq/L (ref 96–112)
Creatinine, Ser: 0.79 mg/dL (ref 0.50–1.35)
GFR calc non Af Amer: >90 mL/min (ref >90)
Glucose, Bld: 99 mg/dL (ref 70–99)
Potassium: 4.1 mEq/L (ref 3.7–5.3)
SODIUM: 140 meq/L (ref 137–147)

## 2014-03-23 ENCOUNTER — Encounter (HOSPITAL_BASED_OUTPATIENT_CLINIC_OR_DEPARTMENT_OTHER): Payer: Self-pay | Admitting: Anesthesiology

## 2014-03-23 ENCOUNTER — Encounter (HOSPITAL_BASED_OUTPATIENT_CLINIC_OR_DEPARTMENT_OTHER): Payer: 59 | Admitting: Anesthesiology

## 2014-03-23 ENCOUNTER — Ambulatory Visit (HOSPITAL_BASED_OUTPATIENT_CLINIC_OR_DEPARTMENT_OTHER): Payer: 59 | Admitting: Anesthesiology

## 2014-03-23 ENCOUNTER — Ambulatory Visit (HOSPITAL_BASED_OUTPATIENT_CLINIC_OR_DEPARTMENT_OTHER)
Admission: RE | Admit: 2014-03-23 | Discharge: 2014-03-23 | Disposition: A | Discharge status: Home / Self Care | Payer: 59 | Source: Ambulatory Visit | Attending: Orthopedic Surgery | Admitting: Orthopedic Surgery

## 2014-03-23 ENCOUNTER — Ambulatory Visit: Payer: 59 | Admitting: Physical Therapy

## 2014-03-23 ENCOUNTER — Encounter (HOSPITAL_BASED_OUTPATIENT_CLINIC_OR_DEPARTMENT_OTHER)
Admission: RE | Disposition: A | Discharge status: Home / Self Care | Payer: Self-pay | Source: Ambulatory Visit | Attending: Orthopedic Surgery

## 2014-03-23 DIAGNOSIS — G5600 Carpal tunnel syndrome, unspecified upper limb: Secondary | ICD-10-CM | POA: Insufficient documentation

## 2014-03-23 DIAGNOSIS — Z95810 Presence of automatic (implantable) cardiac defibrillator: Secondary | ICD-10-CM | POA: Insufficient documentation

## 2014-03-23 DIAGNOSIS — I428 Other cardiomyopathies: Secondary | ICD-10-CM | POA: Insufficient documentation

## 2014-03-23 DIAGNOSIS — I10 Essential (primary) hypertension: Secondary | ICD-10-CM | POA: Insufficient documentation

## 2014-03-23 DIAGNOSIS — Z01812 Encounter for preprocedural laboratory examination: Secondary | ICD-10-CM | POA: Insufficient documentation

## 2014-03-23 DIAGNOSIS — Z8614 Personal history of Methicillin resistant Staphylococcus aureus infection: Secondary | ICD-10-CM | POA: Insufficient documentation

## 2014-03-23 DIAGNOSIS — I5042 Chronic combined systolic (congestive) and diastolic (congestive) heart failure: Secondary | ICD-10-CM | POA: Insufficient documentation

## 2014-03-23 DIAGNOSIS — J45909 Unspecified asthma, uncomplicated: Secondary | ICD-10-CM | POA: Insufficient documentation

## 2014-03-23 DIAGNOSIS — Z87891 Personal history of nicotine dependence: Secondary | ICD-10-CM | POA: Insufficient documentation

## 2014-03-23 DIAGNOSIS — I509 Heart failure, unspecified: Secondary | ICD-10-CM | POA: Insufficient documentation

## 2014-03-23 DIAGNOSIS — Z79899 Other long term (current) drug therapy: Secondary | ICD-10-CM | POA: Insufficient documentation

## 2014-03-23 DIAGNOSIS — Z7982 Long term (current) use of aspirin: Secondary | ICD-10-CM | POA: Insufficient documentation

## 2014-03-23 DIAGNOSIS — E039 Hypothyroidism, unspecified: Secondary | ICD-10-CM | POA: Insufficient documentation

## 2014-03-23 HISTORY — PX: CARPAL TUNNEL RELEASE: SHX101

## 2014-03-23 HISTORY — DX: Dorsalgia, unspecified: M54.9

## 2014-03-23 HISTORY — DX: Presence of dental prosthetic device (complete) (partial): Z97.2

## 2014-03-23 HISTORY — DX: Methicillin resistant Staphylococcus aureus infection, unspecified site: A49.02

## 2014-03-23 HISTORY — DX: Presence of spectacles and contact lenses: Z97.3

## 2014-03-23 SURGERY — CARPAL TUNNEL RELEASE
Anesthesia: Monitor Anesthesia Care | Site: Wrist | Laterality: Right

## 2014-03-23 MED ORDER — PROPOFOL 10 MG/ML IV EMUL
INTRAVENOUS | Status: AC
  Filled 2014-03-23: qty 50

## 2014-03-23 MED ORDER — MIDAZOLAM HCL 2 MG/2ML IJ SOLN
INTRAMUSCULAR | Status: AC
  Filled 2014-03-23: qty 2

## 2014-03-23 MED ORDER — PROPOFOL 10 MG/ML IV BOLUS
INTRAVENOUS | Status: AC
  Filled 2014-03-23: qty 60

## 2014-03-23 MED ORDER — BUPIVACAINE HCL (PF) 0.25 % IJ SOLN
INTRAMUSCULAR | Status: DC | PRN
  Administered 2014-03-23: 9.5 mL

## 2014-03-23 MED ORDER — VANCOMYCIN HCL IN DEXTROSE 1-5 GM/200ML-% IV SOLN
INTRAVENOUS | Status: AC
  Filled 2014-03-23: qty 200

## 2014-03-23 MED ORDER — VANCOMYCIN HCL IN DEXTROSE 1-5 GM/200ML-% IV SOLN: 1000.0000 mg | INTRAVENOUS | Status: DC

## 2014-03-23 MED ORDER — ONDANSETRON HCL 4 MG/2ML IJ SOLN
INTRAMUSCULAR | Status: DC | PRN
  Administered 2014-03-23: 4 mg via INTRAVENOUS

## 2014-03-23 MED ORDER — OXYCODONE-ACETAMINOPHEN 5-325 MG PO TABS: ORAL_TABLET | ORAL | Status: DC

## 2014-03-23 MED ORDER — BUPIVACAINE HCL (PF) 0.25 % IJ SOLN
INTRAMUSCULAR | Status: AC
  Filled 2014-03-23: qty 30

## 2014-03-23 MED ORDER — FENTANYL CITRATE 0.05 MG/ML IJ SOLN: 50.0000 ug | INTRAMUSCULAR | Status: DC | PRN

## 2014-03-23 MED ORDER — PROPOFOL INFUSION 10 MG/ML OPTIME
INTRAVENOUS | Status: DC | PRN
  Administered 2014-03-23: 100 ug/kg/min via INTRAVENOUS

## 2014-03-23 MED ORDER — MIDAZOLAM HCL 2 MG/2ML IJ SOLN: 1.0000 mg | INTRAMUSCULAR | Status: DC | PRN

## 2014-03-23 MED ORDER — FENTANYL CITRATE 0.05 MG/ML IJ SOLN
INTRAMUSCULAR | Status: AC
  Filled 2014-03-23: qty 6

## 2014-03-23 MED ORDER — LACTATED RINGERS IV SOLN
INTRAVENOUS | Status: DC
  Administered 2014-03-23: 08:00:00 via INTRAVENOUS

## 2014-03-23 MED ORDER — LIDOCAINE HCL (CARDIAC) 20 MG/ML IV SOLN
INTRAVENOUS | Status: DC | PRN
  Administered 2014-03-23: 30 mg via INTRAVENOUS

## 2014-03-23 MED ORDER — CHLORHEXIDINE GLUCONATE 4 % EX LIQD: 60.0000 mL | Freq: Once | CUTANEOUS | Status: DC

## 2014-03-23 MED ORDER — VANCOMYCIN HCL 1000 MG IV SOLR
1000.0000 mg | INTRAVENOUS | Status: DC | PRN
  Administered 2014-03-23: 1000 mg via INTRAVENOUS

## 2014-03-23 MED ORDER — FENTANYL CITRATE 0.05 MG/ML IJ SOLN
INTRAMUSCULAR | Status: DC | PRN
  Administered 2014-03-23 (×4): 25 ug via INTRAVENOUS

## 2014-03-23 SURGICAL SUPPLY — 38 items
BANDAGE ELASTIC 3 VELCRO ST LF (GAUZE/BANDAGES/DRESSINGS) ×3 IMPLANT
BLADE MINI RND TIP GREEN BEAV (BLADE) IMPLANT
BLADE SURG 15 STRL LF DISP TIS (BLADE) ×2 IMPLANT
BLADE SURG 15 STRL SS (BLADE) ×4
BNDG ESMARK 4X9 LF (GAUZE/BANDAGES/DRESSINGS) IMPLANT
BNDG GAUZE ELAST 4 BULKY (GAUZE/BANDAGES/DRESSINGS) ×3 IMPLANT
CHLORAPREP W/TINT 26ML (MISCELLANEOUS) ×3 IMPLANT
CORDS BIPOLAR (ELECTRODE) ×3 IMPLANT
COVER MAYO STAND STRL (DRAPES) ×3 IMPLANT
COVER TABLE BACK 60X90 (DRAPES) ×3 IMPLANT
CUFF TOURNIQUET SINGLE 18IN (TOURNIQUET CUFF) ×3 IMPLANT
DRAPE EXTREMITY T 121X128X90 (DRAPE) ×3 IMPLANT
DRAPE SURG 17X23 STRL (DRAPES) ×3 IMPLANT
DRSG PAD ABDOMINAL 8X10 ST (GAUZE/BANDAGES/DRESSINGS) ×3 IMPLANT
GAUZE SPONGE 4X4 12PLY STRL (GAUZE/BANDAGES/DRESSINGS) ×3 IMPLANT
GAUZE XEROFORM 1X8 LF (GAUZE/BANDAGES/DRESSINGS) ×3 IMPLANT
GLOVE BIO SURGEON STRL SZ 6.5 (GLOVE) ×2 IMPLANT
GLOVE BIO SURGEON STRL SZ7.5 (GLOVE) ×3 IMPLANT
GLOVE BIO SURGEONS STRL SZ 6.5 (GLOVE) ×1
GLOVE BIOGEL PI IND STRL 7.0 (GLOVE) ×2 IMPLANT
GLOVE BIOGEL PI IND STRL 8 (GLOVE) ×1 IMPLANT
GLOVE BIOGEL PI INDICATOR 7.0 (GLOVE) ×4
GLOVE BIOGEL PI INDICATOR 8 (GLOVE) ×2
GOWN STRL REUS W/ TWL LRG LVL3 (GOWN DISPOSABLE) ×1 IMPLANT
GOWN STRL REUS W/TWL LRG LVL3 (GOWN DISPOSABLE) ×2
GOWN STRL REUS W/TWL XL LVL3 (GOWN DISPOSABLE) ×3 IMPLANT
NEEDLE HYPO 25X1 1.5 SAFETY (NEEDLE) ×3 IMPLANT
NS IRRIG 1000ML POUR BTL (IV SOLUTION) ×3 IMPLANT
PACK BASIN DAY SURGERY FS (CUSTOM PROCEDURE TRAY) ×3 IMPLANT
PADDING CAST ABS 4INX4YD NS (CAST SUPPLIES)
PADDING CAST ABS COTTON 4X4 ST (CAST SUPPLIES) IMPLANT
STOCKINETTE 4X48 STRL (DRAPES) ×3 IMPLANT
SUT ETHILON 4 0 PS 2 18 (SUTURE) ×3 IMPLANT
SYR BULB 3OZ (MISCELLANEOUS) ×3 IMPLANT
SYR CONTROL 10ML LL (SYRINGE) ×3 IMPLANT
TOWEL OR 17X24 6PK STRL BLUE (TOWEL DISPOSABLE) ×6 IMPLANT
TOWEL OR NON WOVEN STRL DISP B (DISPOSABLE) ×3 IMPLANT
UNDERPAD 30X30 INCONTINENT (UNDERPADS AND DIAPERS) ×3 IMPLANT

## 2014-03-23 NOTE — Transfer of Care (Signed)
Immediate Anesthesia Transfer of Care Note  Patient: Peter [name]  Procedure(s) Performed: Procedure(s): RIGHT CARPAL TUNNEL RELEASE (Right)  Patient Location: PACU  Anesthesia Type:MAC  Level of Consciousness: awake, alert , oriented and patient cooperative  Airway & Oxygen Therapy: Patient Spontanous Breathing and Patient connected to face mask oxygen  Post-op Assessment: Report given to PACU RN and Post -op Vital signs reviewed and stable  Post vital signs: Reviewed and stable  Complications: No apparent anesthesia complications

## 2014-03-23 NOTE — Anesthesia Postprocedure Evaluation (Signed)
  Anesthesia Post-op Note  Patient: Peter [name]  Procedure(s) Performed: Procedure(s): RIGHT CARPAL TUNNEL RELEASE (Right)  Patient Location: PACU  Anesthesia Type:MAC and Bier block  Level of Consciousness: awake and alert   Airway and Oxygen Therapy: Patient Spontanous Breathing  Post-op Pain: none  Post-op Assessment: Post-op Vital signs reviewed and Patient's Cardiovascular Status Stable  Post-op Vital Signs: stable  Last Vitals:  Filed Vitals:   03/23/14 0945  BP: 157/99  Pulse: 62  Temp:   Resp: 18    Complications: No apparent anesthesia complications

## 2014-03-23 NOTE — Anesthesia Preprocedure Evaluation (Deleted)
Anesthesia Evaluation    Airway       Dental   Pulmonary former smoker,          Cardiovascular hypertension,     Neuro/Psych    GI/Hepatic   Endo/Other    Renal/GU      Musculoskeletal   Abdominal   Peds  Hematology   Anesthesia Other Findings   Reproductive/Obstetrics                          Anesthesia Physical Anesthesia Plan Anesthesia Quick Evaluation  

## 2014-03-23 NOTE — Anesthesia Preprocedure Evaluation (Addendum)
Anesthesia Evaluation  Patient identified by MRN, date of birth, ID band Patient awake    Reviewed: Allergy & Precautions, H&P , NPO status , reviewed documented beta blocker date and time   History of Anesthesia Complications Negative for: history of anesthetic complications  Airway Mallampati: II TM Distance: >3 FB Neck ROM: Full    Dental  (+) Teeth Intact, Dental Advisory Given, Partial Upper, Poor Dentition   Pulmonary shortness of breath and with exertion, asthma , former smoker,  breath sounds clear to auscultation        Cardiovascular hypertension, Pt. on medications and Pt. on home beta blockers + DOE - Cardiac Defibrillator Rhythm:Regular Rate:Normal     Neuro/Psych    GI/Hepatic negative GI ROS, Neg liver ROS,   Endo/Other  Hypothyroidism   Renal/GU      Musculoskeletal negative musculoskeletal ROS (+)   Abdominal   Peds  Hematology negative hematology ROS (+)   Anesthesia Other Findings   Reproductive/Obstetrics                          Anesthesia Physical Anesthesia Plan  ASA: III  Anesthesia Plan: MAC and Bier Block   Post-op Pain Management:    Induction: Intravenous  Airway Management Planned: Simple Face Mask  Additional Equipment:   Intra-op Plan:   Post-operative Plan:   Informed Consent: I have reviewed the patients History and Physical, chart, labs and discussed the procedure including the risks, benefits and alternatives for the proposed anesthesia with the patient or authorized representative who has indicated his/her understanding and acceptance.     Plan Discussed with: CRNA and Surgeon  Anesthesia Plan Comments:         Anesthesia Quick Evaluation

## 2014-03-23 NOTE — Op Note (Signed)
NAME:  Peter [name], Peter [name] NO.:  [id number]  MEDICAL RECORD NO.:  [id number]  LOCATION:                                 FACILITY:  PHYSICIAN:  Leanora Cover, MD        DATE OF BIRTH:  [birth year]  DATE OF PROCEDURE:  03/23/2014 DATE OF DISCHARGE:  03/23/2014                              OPERATIVE REPORT   PREOPERATIVE DIAGNOSIS:  Right carpal tunnel syndrome.  POSTOPERATIVE DIAGNOSIS:  Right carpal tunnel syndrome.  PROCEDURE:  Right carpal tunnel release.  SURGEON:  Leanora Cover, MD  ASSISTANT:  None.  ANESTHESIA:  Bier block with sedation.  IV FLUIDS:  Per Anesthesia flow sheet.  ESTIMATED BLOOD LOSS:  Minimal.  COMPLICATIONS:  None.  SPECIMENS:  Right palm fascia to Pathology.  TOURNIQUET TIME:  Thirty eight minutes.  DISPOSITION:  Stable to PACU.  INDICATIONS:  Peter [name] is a 63 year old male who has had issues with his bilateral upper extremities for approximately 8 months.  He had cord compression which was decompressed in November 2014.  He has noted continued weakness on the right hand as well as some muscular atrophy. He was referred to me for further evaluation.  On examination, he had intrinsic atrophy in the thumb index webspace and the small webspace as well as thenar atrophy.  He had difficulty with opposition of the thumb and was unable to abduct it palmarly.  He had positive nerve conduction studies confirming carpal tunnel syndrome.  I discussed with Peter [name], the nature of the condition.  We discussed treatment options. Risks, benefits, alternatives of surgery were discussed including risk of blood loss, infection, damage to nerves, vessels, tendons, ligaments, bone; failure of surgery; need for additional surgery, complications with wound healing, continued pain.  We also discussed that the atrophy would not change after surgical decompression, hopefully would not get any worse.  He also understands that the thumb index  webspace, and ring/small finger intermetacarpal space atrophy is not due to carpal tunnel compression.  Thus is not treated by carpal tunnel release.  OPERATIVE COURSE:  After being identified preoperatively by myself, the patient and I agreed upon procedure and site procedure.  Surgical site was marked.  Risks, benefits, and alternatives of surgery were reviewed and wished to proceed.  Surgical consent was signed.  He was given IV vancomycin as preoperative antibiotic prophylaxis due to allergy to [facility].  He was transferred to the operating room and placed on the operating room table in supine position with the right upper extremity on arm board.  General Bier block anesthesia was induced by anesthesiologist.  Right upper extremity was prepped and draped in normal sterile orthopedic fashion.  Surgical pause was performed between surgeons, anesthesia, and operating room staff, and all were in agreement as to the patient, procedure, and site procedure.  Tourniquet at the proximal aspect of the forearm had been inflated for the Bier block.  Incision was made over the transverse carpal ligament and carried into subcutaneous tissues by spreading technique.  Bipolar electrocautery was used to obtain hemostasis.  The palmar fascia was sharply incised.  The transverse carpal ligament was identified.  We sharply incised.  It was incised distally first.  Care was taken to ensure complete decompression distally.  He was incised proximally. Scissors were used to split the distal aspect of volar antebrachial fascia.  The nerve was inspected.  It was flattened and hyperemic.  A finger was placed into the wound to ensure complete decompression which was the case.  The motor branch was identified and was intact.  There was also noted to be some grayish material in the palmar fascia during the dissection.  Some of this was removed and sent to Pathology for examination.  There were no tissue changes  surrounding it.  The wound was then copiously irrigated with sterile saline.  It was closed with 4- 0 nylon in a horizontal mattress fashion.  The anesthesia had been augmented with 9.5 mL of 0.25% plain Marcaine at the beginning of the procedure.  The wound was then dressed with sterile Xeroform, 4x4s, and ABD and wrapped with Kerlix and Ace bandage.  Tourniquet was deflated at 38 minutes.  Fingertips were pink with brisk capillary refill after deflation of tourniquet.  Operative drapes were broken down.  The patient was awoken from anesthesia safely.  He was transferred back to the stretcher and taken to PACU in stable condition.  I will see him back in the office in 1 week for postoperative follow up.  I will give him Percocet 5/325, 1-2 p.o. q.6 hours p.r.n. pain, dispensed #30.     Leanora Cover, MD     KK/MEDQ  D:  03/23/2014  T:  03/23/2014  Job:  [id number]

## 2014-03-23 NOTE — H&P (Signed)
Peter [name] is an 63 y.o. male.   Chief Complaint: right carpal tunnel syndrome HPI: 63 yo rhd male with weakness in right hand x 8 months.  Has noted atrophy of musculature of hand and weakness of grip.  Had cervical stenosis treated with decompression November 2014.  Nerve conduction studies positive for carpal tunnel syndrome.  He wishes to have a right carpal tunnel release.  Past Medical History  Diagnosis Date  . Allergy   . Asthma   . Hypertension   . Substance abuse     not in many years  . Acute combined systolic and diastolic heart failure 2/70/3500  . Shortness of breath     with asthma  . Arthritis   . Cardiomyopathy   . Complication of anesthesia     problems urinating after anesthesia  . Hypothyroidism   . Wears glasses   . Wears partial dentures     top  . Back pain   . MRSA (methicillin resistant Staphylococcus aureus) infection     lumbar-2015    Past Surgical History  Procedure Laterality Date  . Cardiac catheterization    . Anterior cervical decomp/discectomy fusion N/A 07/30/2013    Procedure: CERVICAL FIVE,CERVICAL SIX CORPECTOMY,ANTERIOR ARTHRODESIS,PEEK INTERBODY GRAFT CERVICAL FOUR-CERVICAL SEVEN,ANTERIOR INSTRUMENTATION;  Surgeon: Winfield Cunas, MD;  Location: [place] [facility];  Service: Neurosurgery;  Laterality: N/A;  . Lumbar wound debridement N/A 11/07/2013    Procedure: LUMBAR WOUND DEBRIDEMENT;  Surgeon: Winfield Cunas, MD;  Location: [place] [facility];  Service: Neurosurgery;  Laterality: N/A;  . Tonsillectomy    . Back surgery  1/15    lumb fusion  . Colonoscopy      History reviewed. No pertinent family history. Social History:  reports that he quit smoking about 18 years ago. He has never used smokeless tobacco. He reports that he does not drink alcohol or use illicit drugs.  Allergies:  Allergies  Allergen Reactions  . Keflex [Cephalexin] Anaphylaxis    Medications Prior to Admission  Medication Sig Dispense Refill  . acetaminophen  (TYLENOL) 500 MG tablet Take 1,000 mg by mouth 2 (two) times daily as needed (painh).       Marland Kitchen albuterol (PROVENTIL HFA;VENTOLIN HFA) 108 (90 BASE) MCG/ACT inhaler Inhale 2 puffs into the lungs every 6 (six) hours as needed for wheezing.      Marland Kitchen aspirin EC 81 MG tablet Take 81 mg by mouth 2 (two) times daily.      Marland Kitchen b complex vitamins tablet Take 1 tablet by mouth daily.      . carvedilol (COREG) 12.5 MG tablet Take 12.5 mg by mouth 2 (two) times daily with a meal.      . digoxin (LANOXIN) 0.25 MG tablet Take 0.25 mg by mouth daily.      Marland Kitchen docusate sodium (COLACE) 100 MG capsule Take 100 mg by mouth 2 (two) times daily.      . furosemide (LASIX) 20 MG tablet Take 1 tablet (20 mg total) by mouth daily.  30 tablet    . HYDROmorphone (DILAUDID) 2 MG tablet Take 1 tablet (2 mg total) by mouth every 4 (four) hours as needed for severe pain.  80 tablet  0  . isosorbide-hydrALAZINE (BIDIL) 20-37.5 MG per tablet Take 1 tablet by mouth 2 (two) times daily.  90 tablet  2  . levothyroxine (SYNTHROID, LEVOTHROID) 200 MCG tablet Take 200 mcg by mouth daily before breakfast.      . Multiple Vitamin (MULTIVITAMIN WITH MINERALS) TABS  Take 1 tablet by mouth daily.      . tamsulosin (FLOMAX) 0.4 MG CAPS capsule Take 1 capsule (0.4 mg total) by mouth daily after supper.  30 capsule  1  . senna-docusate (SENOKOT-S) 8.6-50 MG per tablet Take 1 tablet by mouth at bedtime as needed for mild constipation.        No results found for this or any previous visit (from the past 48 hour(s)).  No results found.   A comprehensive review of systems was negative except for: Eyes: positive for contacts/glasses Respiratory: positive for asthma  Blood pressure 133/82, pulse 94, temperature 98 F (36.7 C), temperature source Oral, resp. rate 20, height 6\' 3"  (1.905 m), weight 98.884 kg (218 lb), SpO2 97.00%.  General appearance: alert, cooperative and appears stated age Head: Normocephalic, without obvious abnormality,  atraumatic Neck: supple, symmetrical, trachea midline Resp: clear to auscultation bilaterally Cardio: regular rate and rhythm GI: non tender Extremities: intact sensation and capillary refill all digits.  +epl/fpl/io.  io is weak in right hand.  poor thumb opposition and abduction in right hand.  no wounds. Pulses: 2+ and symmetric Skin: Skin color, texture, turgor normal. No rashes or lesions Neurologic: Grossly normal Incision/Wound: none  Assessment/Plan Right carpal tunnel syndrome.  Non operative and operative treatment options were discussed with the patient and patient wishes to proceed with operative treatment. Risks, benefits, and alternatives of surgery were discussed and the patient agrees with the plan of care.  Also discussed that procedure is to try to stop further atrophy and is not likely to reverse it and that atrophy of intermetacarpal space of thumb/index and ring/small is due to ulnar nerve and will not be treated by carpal tunnel release.  Patient voiced understanding and wishes to proceed with carpal tunnel release.  KUZMA,KEVIN R 03/23/2014, 8:28 AM

## 2014-03-23 NOTE — Op Note (Signed)
[id number] 

## 2014-03-23 NOTE — Discharge Instructions (Addendum)

## 2014-03-23 NOTE — Anesthesia Procedure Notes (Addendum)
Anesthesia Regional Block:  Bier block (IV Regional)  Pre-Anesthetic Checklist: ,, timeout performed, Correct Patient, Correct Site, Correct Laterality, Correct Procedure, Correct Position, site marked, Risks and benefits discussed,  Surgical consent,  Pre-op evaluation,  At surgeon's request and post-op pain management  Laterality: Right  Prep: alcohol swabs       Bier block (IV Regional) Narrative:  Injection made incrementally with aspirations every 35 mL.  Performed by: Personally  Resident/CRNA: Jacklynn Barnacle CRNA  Additional Notes: 0.5% Lidocaine 35 ml injected incrementally without complication or report of unusual symptoms by patient, after esmarch wrapping of forearm.   Procedure Name: MAC Date/Time: 03/23/2014 8:35 AM Performed by: Marrianne Mood Pre-anesthesia Checklist: Patient identified, Timeout performed, Emergency Drugs available, Suction available and Patient being monitored Patient Re-evaluated:Patient Re-evaluated prior to inductionOxygen Delivery Method: Circle system utilized and Simple face mask Comments: Spontaneous respirations maintained throughout.

## 2014-03-23 NOTE — Brief Op Note (Signed)
03/23/2014  9:20 AM  PATIENT:  Jiles Prows  63 y.o. male  PRE-OPERATIVE DIAGNOSIS:  RIGHT CARPAL TUNNEL SYNDROME  POST-OPERATIVE DIAGNOSIS:  RIGHT CARPAL TUNNEL SYNDROME  PROCEDURE:  Procedure(s): RIGHT CARPAL TUNNEL RELEASE (Right)  SURGEON:  Surgeon(s) and Role:    * Tennis Must, MD - Primary  PHYSICIAN ASSISTANT:   ASSISTANTS: none   ANESTHESIA:   Bier block with sedation  EBL:  Total I/O In: 400 [I.V.:400] Out: -   BLOOD ADMINISTERED:none  DRAINS: none   LOCAL MEDICATIONS USED:  MARCAINE     SPECIMEN:  Source of Specimen:  right palm  DISPOSITION OF SPECIMEN:  PATHOLOGY  COUNTS:  YES  TOURNIQUET:   Total Tourniquet Time Documented: Forearm (Right) - 38 minutes Total: Forearm (Right) - 38 minutes   DICTATION: .Other Dictation: Dictation Number [id number]  PLAN OF CARE: Discharge to home after PACU  PATIENT DISPOSITION:  PACU - hemodynamically stable.

## 2014-03-24 ENCOUNTER — Encounter (HOSPITAL_BASED_OUTPATIENT_CLINIC_OR_DEPARTMENT_OTHER): Payer: Self-pay | Admitting: Orthopedic Surgery

## 2014-04-09 ENCOUNTER — Encounter: Payer: Self-pay | Admitting: Gastroenterology

## 2014-06-09 ENCOUNTER — Ambulatory Visit (AMBULATORY_SURGERY_CENTER): Payer: Self-pay | Admitting: *Deleted

## 2014-06-09 ENCOUNTER — Telehealth: Payer: Self-pay | Admitting: Gastroenterology

## 2014-06-09 VITALS — Ht 74.0 in | Wt 223.8 lb

## 2014-06-09 DIAGNOSIS — Z86010 Personal history of colonic polyps: Secondary | ICD-10-CM

## 2014-06-09 MED ORDER — NA SULFATE-K SULFATE-MG SULF 17.5-3.13-1.6 GM/177ML PO SOLN: ORAL | Status: DC

## 2014-06-09 NOTE — Progress Notes (Signed)
No allergies to eggs or soy. No problems with anesthesia.  Pt given Emmi instructions for colonoscopy  No oxygen use  No diet drug use  

## 2014-06-09 NOTE — Telephone Encounter (Signed)
Pt is calling to say that he cannot afford Suprep; his cost is $75.00.  Pt given instructions for split dose Miralax prep.

## 2014-06-11 ENCOUNTER — Encounter: Payer: Self-pay | Admitting: *Deleted

## 2014-06-11 NOTE — Progress Notes (Signed)
Patient ID: Peter [name], male   DOB: 04-24-1951, 63 y.o.   MRN: [id number] Pt is scheduled for recall colonoscopy with Dr Deatra Ina 06/23/2014.  He had heart failure in 2014 after virus.  EF was 15%.  Pt had echocardiogram Januaray 2015; EF 40 to 45%.    Echocardiogram September 2015 EF is 46%  Pt brought reports to [facility]; will send them to be scanned into EPIC.  Pt says he has had 3 surgeries in the past year with no problems.

## 2014-06-15 ENCOUNTER — Encounter: Payer: Self-pay | Admitting: Neurology

## 2014-06-15 ENCOUNTER — Ambulatory Visit (INDEPENDENT_AMBULATORY_CARE_PROVIDER_SITE_OTHER): Payer: 59 | Admitting: Neurology

## 2014-06-15 VITALS — BP 83/56 | HR 95 | Resp 17 | Ht 74.5 in | Wt 219.0 lb

## 2014-06-15 DIAGNOSIS — R0609 Other forms of dyspnea: Secondary | ICD-10-CM

## 2014-06-15 DIAGNOSIS — R0989 Other specified symptoms and signs involving the circulatory and respiratory systems: Secondary | ICD-10-CM

## 2014-06-15 DIAGNOSIS — F19982 Other psychoactive substance use, unspecified with psychoactive substance-induced sleep disorder: Secondary | ICD-10-CM | POA: Insufficient documentation

## 2014-06-15 DIAGNOSIS — T40605A Adverse effect of unspecified narcotics, initial encounter: Secondary | ICD-10-CM

## 2014-06-15 DIAGNOSIS — G478 Other sleep disorders: Secondary | ICD-10-CM

## 2014-06-15 DIAGNOSIS — G4730 Sleep apnea, unspecified: Secondary | ICD-10-CM

## 2014-06-15 NOTE — Progress Notes (Signed)
SLEEP MEDICINE CLINIC   Provider:  Larey [name], M D  Referring Provider: Adrian Prows, MD cardiology  Primary Care Physician:  [name], MD  Patient arrived late, no med list.    Chief Complaint  Patient presents with  . New Evaluation    Room 11  . Sleep consult    HPI:  Peter [name] is a 63 y.o. male , who Is seen here as a referral from Dr. Einar [name] for a sleep apnea evaluation, his primary neurologist is Dr Peter [name] and his PCP is [name].   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 He also reports an increasing degree degree of fatigue and excessive daytime sleepiness. The patient has an ejection fraction of 46% with a mild decrease in global cardiac wall motion according to Peter [name]  notes. Mild mitral regurgitation and mild tricuspid regurgitation no evidence of pulmonary hypertension. A stress test correlated that there [name] be a small infarct in the inferior cardiac wall.  Overall the Doppler results were still that of a low  risk stress test. Echo on in August 2012  still had shown ejection fraction of 50%. Echo in 2013 20 % ,  October 2014 : his  EF improved from 20% to 40%. His cardiologist stated that he has systolic and diastolic chronic congestive heart failure which was slowly improving and chronic diastolic heart failure due to idiopathic cardiomyopathy.  His cardiologist reported that persistent complaint of fatigue and daytime somnolence he wanted him to be set up to a sleep study here at [facility] sleep he was concerned about the prevalence of obstructive or central sleep apnea and CO2 will need to be measured for this asthmatic patient with congestive heart area. Referral was made to Dr. Brett [name].  The patient reports chronic pain and is using nightly narcotic pain meds, Has asthma and daily uses inhalers, and has been using diuretics.  He was  still filling papers in his exam room, 25 minutes after being brought back by my assistant. He is logorrhoieic.   Patient reports going to bed usually between 11 PM and midnight, occasionally as late as 3:30 AM. The patient reports that he will falsely brother promptly but wakes up between 3 and 4 hours later and finds himself unable to reinitiate sleep. Usually he will be able to get another couple of hours of sleep after 5 AM. He endorsed 3 nocturia breaks at night. His wife reports snoring, sometimes very shallow breathing. She does not longer sleep in the same room.  He  Sleeps elevated, chest on a wedge. Supine. SOB. drinks once a week caffeine , no tobacco and no ETOH.   Review of Systems: Out of a complete 14 system review, the patient complains of only the following symptoms, and all other reviewed systems are negative. Nocturia, daily naps. Chronic pain , neuro surgical procedures time 3, " I feel wiped out " cannot do anything. Racing thoughts when insomniac.  Narcotic pain medication may be the cause.  Epworth score 14-16  , Fatigue severity score 55 , depression score 4 I started a new job in 08-2012 , by June 2014 he had CHF and from there on on disability, spinal stenosis.      History   Social History  . Marital Status: Married    Spouse Name: Peter [name]    Number of Children: 4  . Years  of Education: College   Occupational History  .     Social History Main Topics  . Smoking status: Former Smoker -- 20 years    Quit date: 08/14/1995  . Smokeless tobacco: Never Used  . Alcohol Use: No  . Drug Use: No  . Sexual Activity: Yes   Other Topics Concern  . 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   Patient has four adult children.   Patient is disabled.   Patient has college education.   Patient is right-handed.   Patient drinks very little caffeine.    Family History  Problem Relation Age of Onset  .  Colon cancer Neg Hx     Past Medical History  Diagnosis Date  . Allergy   . Asthma   . Hypertension   . Substance abuse     not in many years  . Acute combined systolic and diastolic heart failure 6/54/6503  . Shortness of breath     with asthma  . Arthritis   . Cardiomyopathy   . Complication of anesthesia     problems urinating after anesthesia  . Hypothyroidism   . Wears glasses   . Wears partial dentures     top  . Back pain   . MRSA (methicillin resistant Staphylococcus aureus) infection     lumbar-2015  . Fatigue due to treatment   . Hypersomnia due to drug     Past Surgical History  Procedure Laterality Date  . Cardiac catheterization  2014  . Anterior cervical decomp/discectomy fusion N/A 07/30/2013    Procedure: CERVICAL FIVE,CERVICAL SIX CORPECTOMY,ANTERIOR ARTHRODESIS,PEEK INTERBODY GRAFT CERVICAL FOUR-CERVICAL SEVEN,ANTERIOR INSTRUMENTATION;  Surgeon: Peter Cunas, MD;  Location: [place];  Service: Neurosurgery;  Laterality: N/A;  . Lumbar wound debridement N/A 11/07/2013    Procedure: LUMBAR WOUND DEBRIDEMENT;  Surgeon: Peter Cunas, MD;  Location: [place] [facility];  Service: Neurosurgery;  Laterality: N/A;  . Tonsillectomy  1958  . Back surgery  1/15    lumb fusion  . Colonoscopy  2003  . Carpal tunnel release Right 03/23/2014    Procedure: RIGHT CARPAL TUNNEL RELEASE;  Surgeon: Peter Must, MD;  Location: [place];  Service: Orthopedics;  Laterality: Right;    Current Outpatient Prescriptions  Medication Sig Dispense Refill  . albuterol (PROVENTIL HFA;VENTOLIN HFA) 108 (90 BASE) MCG/ACT inhaler Inhale 2 puffs into the lungs every 6 (six) hours as needed for wheezing.      Marland Kitchen aspirin EC 81 MG tablet Take 81 mg by mouth 2 (two) times daily.      Marland Kitchen b complex vitamins tablet Take 1 tablet by mouth daily.      . Bisacodyl (DULCOLAX PO) Take by mouth as directed. Dulcolax as directed for colonoscopy prep      . carvedilol (COREG) 12.5 MG  tablet Take 12.5 mg by mouth 2 (two) times daily with a meal.      . docusate sodium (COLACE) 100 MG capsule Take 100 mg by mouth 2 (two) times daily.      . isosorbide-hydrALAZINE (BIDIL) 20-37.5 MG per tablet Take 1 tablet by mouth 2 (two) times daily.  90 tablet  2  . levothyroxine (SYNTHROID, LEVOTHROID) 200 MCG tablet Take 200 mcg by mouth daily before breakfast.      . Multiple Vitamin (MULTIVITAMIN WITH MINERALS) TABS Take 1 tablet by mouth daily.      Marland Kitchen  oxyCODONE-acetaminophen (PERCOCET) 5-325 MG per tablet 1-2 tabs po q6 hours prn pain  30 tablet  0  . polyethylene glycol powder (MIRALAX) powder Take 1 Container by mouth as directed. Miralax as directed for colonoscopy prep      . tamsulosin (FLOMAX) 0.4 MG CAPS capsule Take 1 capsule (0.4 mg total) by mouth daily after supper.  30 capsule  1  . digoxin (LANOXIN) 0.25 MG tablet Take 0.25 mg by mouth daily.      . furosemide (LASIX) 20 MG tablet Take 1 tablet (20 mg total) by mouth daily.  30 tablet    . Na Sulfate-K Sulfate-Mg Sulf (SUPREP BOWEL PREP) SOLN suprep as directed.  No substitutions  354 mL  0  . senna-docusate (SENOKOT-S) 8.6-50 MG per tablet Take 1 tablet by mouth at bedtime as needed for mild constipation.       No current facility-administered medications for this visit.    Allergies as of 06/15/2014 - Review Complete 06/15/2014  Allergen Reaction Noted  . Keflex [cephalexin] Anaphylaxis 03/10/2013    Vitals: BP 83/56  Pulse 95  Resp 17  Ht 6' 2.5" (1.892 m)  Wt 219 lb (99.338 kg)  BMI 27.75 kg/m2 Last Weight:  Wt Readings from Last 1 Encounters:  06/15/14 219 lb (99.338 kg)       Last Height:   Ht Readings from Last 1 Encounters:  06/15/14 6' 2.5" (1.892 m)    Physical exam:  General: The patient is awake, alert and appears not in acute distress. The patient is well groomed. Head: Normocephalic, atraumatic. Neck is supple. Mallampati 3 FF hair. ,  neck circumference: 16.75 . Nasal airflow  Unrestricted  , TMJ is not  evident . Retrognathia is not seen. Partial dentures.  Cardiovascular:  Regular rate and rhythm, without  murmurs or carotid bruit, and without distended neck veins. Respiratory: Lungs are clear to auscultation. Skin:  Without evidence of edema, or rash  posture.  Neurologic exam : The patient is awake and alert, oriented to place and time.   Memory subjective   described as decreased. There is a normal attention span & concentration ability.  Speech is fluent without dysarthria, dysphonia or aphasia.  Mood and affect are appropriate.  Cranial nerves: Pupils are equal and briskly reactive to light. Funduscopic exam without evidence of pallor or edema.  Extraocular movements  in vertical and horizontal planes intact and without nystagmus. Visual fields by finger perimetry are intact. Hearing to finger rub intact.  Facial sensation intact to fine touch. Facial motor strength is symmetric and tongue and uvula move midline.  Motor exam:  Normal tone, muscle bulk and symmetric ,strength in all extremities.he has a brace on the left arm.   Sensory:  Fine touch, pinprick and vibration were tested in all extremities.  Proprioception is tested in the upper extremities only. This was normal.  Coordination: Rapid alternating movements in the fingers/hands is normal. Finger-to-nose maneuver  normal without evidence of ataxia, dysmetria or tremor.  Gait and station: Patient walks without assistive device and is able unassisted to climb up to the exam table. Strength within normal limits. Stance is stable and normal.  Deep tendon reflexes: in the upper and lower extremities are symmetric and intact. Babinski maneuver response is  downgoing.   Assessment:  After physical and neurologic examination, review of laboratory studies, imaging, neurophysiology testing and pre-existing records, assessment is  This patient is in chronic pain and takes narcotics has CHF- there are many reasons for  him to be fatigued and sleep, SOB.  CSA versus OSA - And check for oxygen loss in hypopnea. Asthma  The patient was advised of the nature of the diagnosed sleep disorder , the treatment options and risks for general a health and wellness arising from not treating the condition. Visit duration was 45 minutes.   Plan:  Treatment plan and additional workup :  CO2 needed in sleep study , SPLIT at AHI 20 and score at 4% - if no apnea or hypopnea or hypoxia found,  return to referring physician.  Patient has a general neurologist, Dr Peter [name].   This patient has followed Dr .Peter [name] for spinal stenosis until surgery/ cervical disc fusion. He had a lumbar fusion, only 4 month later and not been gainfully employed.     Asencion Partridge Sparkle Aube MD  06/15/2014

## 2014-06-15 NOTE — Patient Instructions (Signed)
Polysomnography (Sleep Studies) Polysomnography (PSG) is a series of tests used for detecting (diagnosing) obstructive sleep apnea and other sleep disorders. The tests measure how some parts of your body are working while you are sleeping. The tests are extensive and expensive. They are done in a sleep lab or hospital, and vary from center to center. Your caregiver may perform other more simple sleep studies and questionnaires before doing more complete and involved testing. Testing may not be covered by insurance. Some of these tests are:  An EEG (Electroencephalogram). This tests your brain waves and stages of sleep.  An EOG (Electrooculogram). This measures the movements of your eyes. It detects periods of REM (rapid eye movement) sleep, which is your dream sleep.  An EKG (Electrocardiogram). This measures your heart rhythm.  EMG (Electromyography). This is a measurement of how the muscles are working in your upper airway and your legs while sleeping.  An oximetry measurement. It measures how much oxygen (air) you are getting while sleeping.  Breathing efforts may be measured. The same test can be interpreted (understood) differently by different caregivers and centers that study sleep.  Studies may be given an apnea/hypopnea index (AHI). This is a number which is found by counting the times of no breathing or under breathing during the night, and relating those numbers to the amount of time spent in bed. When the AHI is greater than 15, the patient is likely to complain of daytime sleepiness. When the AHI is greater than 30, the patient is at increased risk for heart problems and must be followed more closely. Following the AHI also allows you to know how treatment is working. Simple oximetry (tracking the amount of oxygen that is taken in) can be used for screening patients who:  Do not have symptoms (problems) of OSA.  Have a normal Epworth Sleepiness Scale Score.  Have a low pre-test  probability of having OSA.  Have none of the upper airway problems likely to cause apnea.  Oximetry is also used to determine if treatment is effective in patients who showed significant desaturations (not getting enough oxygen) on their home sleep study. One extra measure of safety is to perform additional studies for the person who only snores. This is because no one can predict with absolute certainty who will have OSA. Those who show significant desaturations (not getting enough oxygen) are recommended to have a more detailed sleep study. Document Released: 03/18/2003 Document Revised: 12/04/2011 Document Reviewed: 11/17/2013 ExitCare Patient Information 2015 [employer]. This information is not intended to replace advice given to you by your health care provider. Make sure you discuss any questions you have with your health care provider.  

## 2014-06-23 ENCOUNTER — Ambulatory Visit (AMBULATORY_SURGERY_CENTER): Payer: 59 | Admitting: Gastroenterology

## 2014-06-23 ENCOUNTER — Encounter: Payer: Self-pay | Admitting: Gastroenterology

## 2014-06-23 VITALS — BP 127/81 | HR 70 | Temp 97.6°F | Resp 29 | Ht 72.0 in | Wt 223.0 lb

## 2014-06-23 DIAGNOSIS — Z1211 Encounter for screening for malignant neoplasm of colon: Secondary | ICD-10-CM

## 2014-06-23 DIAGNOSIS — K5730 Diverticulosis of large intestine without perforation or abscess without bleeding: Secondary | ICD-10-CM

## 2014-06-23 DIAGNOSIS — D126 Benign neoplasm of colon, unspecified: Secondary | ICD-10-CM

## 2014-06-23 DIAGNOSIS — Z86010 Personal history of colonic polyps: Secondary | ICD-10-CM

## 2014-06-23 MED ORDER — SODIUM CHLORIDE 0.9 % IV SOLN: 500.0000 mL | INTRAVENOUS | Status: DC

## 2014-06-23 NOTE — Progress Notes (Signed)
Patient awakening,vss,report to rn 

## 2014-06-23 NOTE — Progress Notes (Signed)
Called to room to assist during endoscopic procedure.  Patient ID and intended procedure confirmed with present staff. Received instructions for my participation in the procedure from the performing physician.  

## 2014-06-23 NOTE — Patient Instructions (Signed)
YOU HAD AN ENDOSCOPIC PROCEDURE TODAY AT THE [facility]: Refer to the procedure report that was given to you for any specific questions about what was found during the examination.  If the procedure report does not answer your questions, please call your gastroenterologist to clarify.  If you requested that your care partner not be given the details of your procedure findings, then the procedure report has been included in a sealed envelope for you to review at your convenience later.  YOU SHOULD EXPECT: Some feelings of bloating in the abdomen. Passage of more gas than usual.  Walking can help get rid of the air that was put into your GI tract during the procedure and reduce the bloating. If you had a lower endoscopy (such as a colonoscopy or flexible sigmoidoscopy) you may notice spotting of blood in your stool or on the toilet paper. If you underwent a bowel prep for your procedure, then you may not have a normal bowel movement for a few days.  DIET: Your first meal following the procedure should be a light meal and then it is ok to progress to your normal diet.  A half-sandwich or bowl of soup is an example of a good first meal.  Heavy or fried foods are harder to digest and may make you feel nauseous or bloated.  Likewise meals heavy in dairy and vegetables can cause extra gas to form and this can also increase the bloating.  Drink plenty of fluids but you should avoid alcoholic beverages for 24 hours.  ACTIVITY: Your care partner should take you home directly after the procedure.  You should plan to take it easy, moving slowly for the rest of the day.  You can resume normal activity the day after the procedure however you should NOT DRIVE or use heavy machinery for 24 hours (because of the sedation medicines used during the test).    SYMPTOMS TO REPORT IMMEDIATELY: A gastroenterologist can be reached at any hour.  During normal business hours, 8:30 AM to 5:00 PM Monday through Friday,  call [phone].  After hours and on weekends, please call the GI answering service at [phone] who will take a message and have the physician on call contact you.   Following lower endoscopy (colonoscopy or flexible sigmoidoscopy):  Excessive amounts of blood in the stool  Significant tenderness or worsening of abdominal pains  Swelling of the abdomen that is new, acute  Fever of 100F or higher    FOLLOW UP: If any biopsies were taken you will be contacted by phone or by letter within the next 1-3 weeks.  Call your gastroenterologist if you have not heard about the biopsies in 3 weeks.  Our staff will call the home number listed on your records the next business day following your procedure to check on you and address any questions or concerns that you may have at that time regarding the information given to you following your procedure. This is a courtesy call and so if there is no answer at the home number and we have not heard from you through the emergency physician on call, we will assume that you have returned to your regular daily activities without incident.  SIGNATURES/CONFIDENTIALITY: You and/or your care partner have signed paperwork which will be entered into your electronic medical record.  These signatures attest to the fact that that the information above on your After Visit Summary has been reviewed and is understood.  Full responsibility of the confidentiality   of this discharge information lies with you and/or your care-partner.  Polyp, diverticulosis, and high fiber diet information given. 

## 2014-06-23 NOTE — Op Note (Signed)
[place]  [address] [place], [id number]   COLONOSCOPY PROCEDURE REPORT  PATIENT: Peter, [name]  MR#: [id number] BIRTHDATE: [birth year] , 63  yrs. old GENDER: male ENDOSCOPIST: Inda Castle, MD REFERRED WF:UXNA [name], M.D. PROCEDURE DATE:  06/23/2014 PROCEDURE:   Colonoscopy with snare polypectomy First Screening Colonoscopy - Avg.  risk and is 50 yrs.  old or older - No.  Prior Negative Screening - Now for repeat screening. 10 or more years since last screening  History of Adenoma - Now for follow-up colonoscopy & has been > or = to 3 yrs.  N/A  Polyps Removed Today? Yes. ASA CLASS:   Class II INDICATIONS:average risk for colon cancer. MEDICATIONS: Monitored anesthesia care and Propofol 350 mg IV  DESCRIPTION OF PROCEDURE:   After the risks benefits and alternatives of the procedure were thoroughly explained, informed consent was obtained.  The digital rectal exam      The LB [id number]  endoscope was introduced through the anus and advanced to the cecum, which was identified by both the appendix and ileocecal valve. No adverse events experienced.   The quality of the prep was Suprep good  The instrument was then slowly withdrawn as the colon was fully examined.      COLON FINDINGS: A flat polyp measuring 3 mm in size was found in the proximal transverse colon.  A polypectomy was performed with a cold snare.  The resection was complete, the polyp tissue was completely retrieved and sent to histology.   There was moderate diverticulosis noted in the ascending colon, transverse colon, and sigmoid colon.   The colon mucosa was otherwise normal. Retroflexed views revealed no abnormalities. The time to cecum=11 minutes 0 seconds.  Withdrawal time=7 minutes 35 seconds.  The scope was withdrawn and the procedure completed. COMPLICATIONS: There were no immediate complications.  ENDOSCOPIC IMPRESSION: 1.   Flat polyp measuring 3 mm in size was  found in the proximal transverse colon; polypectomy was performed with a cold snare 2.   There was moderate diverticulosis noted in the ascending colon, transverse colon, and sigmoid colon 3.   The colon mucosa was otherwise normal  RECOMMENDATIONS: If the polyp(s) removed today are proven to be adenomatous (pre-cancerous) polyps, you will need a repeat colonoscopy in 5 years.  Otherwise you should continue to follow colorectal cancer screening guidelines for "routine risk" patients with colonoscopy in 10 years.  You will receive a letter within 1-2 weeks with the results of your biopsy as well as final recommendations.  Please call my office if you have not received a letter after 3 weeks.  eSigned:  Inda Castle, MD 06/23/2014 12:34 PM   cc:   PATIENT NAME:  Peter, [name] MR#: [id number]

## 2014-06-24 ENCOUNTER — Telehealth: Payer: Self-pay | Admitting: *Deleted

## 2014-06-24 NOTE — Telephone Encounter (Signed)
  Follow up Call-  Call back number 06/23/2014  Post procedure Call Back phone  # [phone]  Permission to leave phone message Yes     Patient questions:  Do you have a fever, pain , or abdominal swelling? No. Pain Score  0 *  Have you tolerated food without any problems? Yes.    Have you been able to return to your normal activities? Yes.    Do you have any questions about your discharge instructions: Diet   No. Medications  No. Follow up visit  No.  Do you have questions or concerns about your Care? No.  Actions: * If pain score is 4 or above: No action needed, pain <4.

## 2014-06-29 ENCOUNTER — Encounter: Payer: Self-pay | Admitting: Gastroenterology

## 2014-07-22 ENCOUNTER — Ambulatory Visit (INDEPENDENT_AMBULATORY_CARE_PROVIDER_SITE_OTHER): Payer: 59 | Admitting: Neurology

## 2014-07-22 DIAGNOSIS — F19982 Other psychoactive substance use, unspecified with psychoactive substance-induced sleep disorder: Secondary | ICD-10-CM

## 2014-07-22 DIAGNOSIS — T40605A Adverse effect of unspecified narcotics, initial encounter: Secondary | ICD-10-CM

## 2014-07-22 DIAGNOSIS — R0609 Other forms of dyspnea: Secondary | ICD-10-CM

## 2014-07-22 DIAGNOSIS — G4730 Sleep apnea, unspecified: Secondary | ICD-10-CM

## 2014-07-29 ENCOUNTER — Telehealth: Payer: Self-pay | Admitting: *Deleted

## 2014-07-29 ENCOUNTER — Other Ambulatory Visit: Payer: Self-pay | Admitting: Neurology

## 2014-07-29 DIAGNOSIS — G4733 Obstructive sleep apnea (adult) (pediatric): Secondary | ICD-10-CM

## 2014-07-29 DIAGNOSIS — G4731 Primary central sleep apnea: Secondary | ICD-10-CM

## 2014-07-29 NOTE — Telephone Encounter (Signed)
Patient returned my call and was provided the results of his sleep study.  Patient was informed that due to a positive indicator for apnea a subsequent CPAP titration study had been recommended.  Patient was in agreement with plan and scheduled his CPAP study for Dec. 14th.  Patient was mailed a copy and Dr. Einar Gip was faxed a copy of the sleep results.

## 2014-08-24 ENCOUNTER — Ambulatory Visit (INDEPENDENT_AMBULATORY_CARE_PROVIDER_SITE_OTHER): Payer: 59 | Admitting: Neurology

## 2014-08-24 ENCOUNTER — Encounter: Payer: Self-pay | Admitting: Neurology

## 2014-08-24 VITALS — BP 123/84 | HR 68 | Ht 74.0 in | Wt 217.0 lb

## 2014-08-24 DIAGNOSIS — G5603 Carpal tunnel syndrome, bilateral upper limbs: Secondary | ICD-10-CM

## 2014-08-24 DIAGNOSIS — M4316 Spondylolisthesis, lumbar region: Secondary | ICD-10-CM

## 2014-08-24 DIAGNOSIS — G5602 Carpal tunnel syndrome, left upper limb: Secondary | ICD-10-CM

## 2014-08-24 DIAGNOSIS — G5601 Carpal tunnel syndrome, right upper limb: Secondary | ICD-10-CM

## 2014-08-24 DIAGNOSIS — M4712 Other spondylosis with myelopathy, cervical region: Secondary | ICD-10-CM

## 2014-08-24 NOTE — Progress Notes (Signed)
[name] is a 63 years old right-handed male, follow-up for his residual cervical myelopathy, lumbar radiculopathy, his primary care physician is Dr. Shon [name].  I saw him for the first time at EMG nerve conduction study in September 2014, he presented with progressive bilateral upper and lower extremity muscle wasting, weakness, he was found to have significant bilateral intrinsic hand muscle atrophy, upper extremity weakness, right worse than left, also mild right lower extremity weakness, with lower extremity hyperreflexia. Electrodiagnostic study has demonstrate active bilateral cervical, lumbar sacral neuropathic changes, there was also evidence of mild axonal peripheral neuropathy, moderate right carpal tunnel syndrome.  He had extensive MRI study: MRI cervical spine demonstrated severe cord compression with T2 hyperintense signal, mainly at C7 to T1 levels. C6-7: disc bulging, uncovertebral joint hypertrophy and facet hypertrophy with severe spinal stenosis (21mm) and severe biforaminal foraminal stenosis; T2 hyperintense cord signal at C7. C7-T1: disc bulging and facet hypertrophy with moderate spinal stenosis and severe biforaminal foraminal stenosis; T2 hyperintense cord signal extending to T1 level. C5-6: disc bulging, uncovertebral joint hypertrophy and facet hypertrophy with moderate-severe spinal stenosis (57mm) and severe biforaminal foraminal stenosis  MRI lumbar spine showed severe spinal stenosis and lateral foraminal stenosis at L4-5, L5-S1: disc bulging and facet hypertrophy with severe biforaminal foraminal stenosis   There was no significant abnormality at MRI of the brain, and MRI of thoracic spine   He underwent cervical 5, 6, corpectomy, anterior arthrodesis, interbody graft, cervical 4-7 anterior instrumentation by Dr. Cyndy Freeze in Nov 2014,  Lumbar L4-5 decompression with posterior lumbar interbody fusion with interbody Peek prostheses 57mm morselized autograft in  January 2015  Posta surgically, he has no significant worsening of his functional status, but continued gait difficulty, bilateral hands muscle weakness, especially his right hand,  He had a repeat nerve conduction EMG in May 2015, Repeat study continued to demonstrate active right cervical, lumbosacral radiculopathy, severe bilateral carpal tunnel syndromes.  He underwent right carpal tunnel release surgery by Dr. Fredna Dow in June 2015, now wearing a rigid right wrist sprain, his right hand paresthesia has improved, but continued to have profound right hand muscle atrophy, weakness  He is now ambulate with mild difficulty, but does not need assistant. He is now on disability, he used to work as a Animator OF SYSTEMS: Full 14 system review of systems performed and notable only for fatigue, apnea, walking difficulty   ALLERGIES: Allergies  Allergen Reactions  . Keflex [Cephalexin] Anaphylaxis    HOME MEDICATIONS: Current Outpatient Prescriptions on File Prior to Visit  Medication Sig Dispense Refill  . Multiple Vitamin (MULTIVITAMIN WITH MINERALS) TABS Take 1 tablet by mouth daily.     No current facility-administered medications on file prior to visit.    PAST MEDICAL HISTORY: Past Medical History  Diagnosis Date  . Allergy   . Asthma   . Hypertension   . Substance abuse     not in many years  . Acute combined systolic and diastolic heart failure 1/61/0960  . Shortness of breath     with asthma  . Arthritis   . Cardiomyopathy   . Complication of anesthesia     problems urinating after anesthesia  . Hypothyroidism   . Wears glasses   . Wears partial dentures     top  . Back pain   . MRSA (methicillin resistant Staphylococcus aureus) infection     lumbar-2015  . Fatigue due to treatment   . Hypersomnia due to drug  PAST SURGICAL HISTORY: Past Surgical History  Procedure Laterality Date  . Cardiac catheterization  2014  . Anterior cervical  decomp/discectomy fusion N/A 07/30/2013    Procedure: CERVICAL FIVE,CERVICAL SIX CORPECTOMY,ANTERIOR ARTHRODESIS,PEEK INTERBODY GRAFT CERVICAL FOUR-CERVICAL SEVEN,ANTERIOR INSTRUMENTATION;  Surgeon: Winfield Cunas, MD;  Location: [facility];  Service: Neurosurgery;  Laterality: N/A;  . Lumbar wound debridement N/A 11/07/2013    Procedure: LUMBAR WOUND DEBRIDEMENT;  Surgeon: Winfield Cunas, MD;  Location: [facility];  Service: Neurosurgery;  Laterality: N/A;  . Tonsillectomy  1958  . Back surgery  1/15    lumb fusion  . Colonoscopy  2003  . Carpal tunnel release Right 03/23/2014    Procedure: RIGHT CARPAL TUNNEL RELEASE;  Surgeon: Tennis Must, MD;  Location: [facility];  Service: Orthopedics;  Laterality: Right;    FAMILY HISTORY: Family History  Problem Relation Age of Onset  . Colon cancer Neg Hx     SOCIAL HISTORY:  History   Social History  . Marital Status: Married    Spouse Name: Rise Paganini    Number of Children: 4  . Years of Education: College   Occupational History  .     Social History Main Topics  . Smoking status: Former Smoker -- 20 years    Quit date: 08/14/1995  . Smokeless tobacco: Never Used  . Alcohol Use: No  . Drug Use: No  . Sexual Activity: Yes   Other Topics Concern  . 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   Patient has four adult children.   Patient is disabled.   Patient has college education.   Patient is right-handed.   Patient drinks very little caffeine.     PHYSICAL EXAM   There were no vitals filed for this visit.  Not recorded      There is no weight on file to calculate BMI.   Generalized: In no acute distress  Neck: Supple, no carotid bruits   Cardiac: Regular rate rhythm  Pulmonary: Clear to auscultation bilaterally  Musculoskeletal: No deformity  Neurological examination  Mentation: Alert oriented to time, place, history taking,  and causual conversation  Cranial nerve II-XII: Pupils were equal round reactive to light. Extraocular movements were full.  Visual field were full on confrontational test. Bilateral fundi were sharp.  Facial sensation and strength were normal. Hearing was intact to finger rubbing bilaterally. Uvula tongue midline.  Head turning and shoulder shrug and were normal and symmetric.Tongue protrusion into cheek strength was normal.  Motor: Shoulder abduction (R/L) 3/4, elbow flexion 3/4, elbow extension 4 plus/3, wrist flexion 4/4, wrist extension 3/4, hip-flexion 4/4, knee flexion 5/5, ankle dorsiflexion 4/5 minus.   Sensory: Intact to fine touch, pinprick, preserved vibratory sensation, and proprioception at toes.  Coordination: Normal finger to nose, heel-to-shin bilaterally there was no truncal ataxia  Gait: Rising up from seated position by pushing on chair, cautious, mildly unsteady  Romberg signs: Negative  Deep tendon reflexes: Brachioradialis  1/1 , biceps 1/1, triceps 1/1, patellar  3/3 , Achilles 2/2, plantar responses were flexor bilaterally.   DIAGNOSTIC DATA (LABS, IMAGING, TESTING) - I reviewed patient records, labs, notes, testing and imaging myself where available.  Lab Results  Component Value Date   WBC 5.2 12/29/2013   HGB 10.5* 12/29/2013   HCT 32.2* 12/29/2013   MCV 79.1 12/29/2013   PLT 329 12/29/2013      Component Value  Date/Time   NA 140 03/20/2014 1110   K 4.1 03/20/2014 1110   CL 100 03/20/2014 1110   CO2 26 03/20/2014 1110   GLUCOSE 99 03/20/2014 1110   BUN 10 03/20/2014 1110   CREATININE 0.79 03/20/2014 1110   CALCIUM 9.5 03/20/2014 1110   PROT 7.5 12/29/2013 1944   PROT 7.1 06/03/2013 1039   ALBUMIN 3.7 12/29/2013 1944   AST 18 12/29/2013 1944   ALT 12 12/29/2013 1944   ALKPHOS 92 12/29/2013 1944   BILITOT 0.4 12/29/2013 1944   GFRNONAA >90 03/20/2014 1110   GFRAA >90 03/20/2014 1110   No results found for: CHOL, HDL, LDLCALC, LDLDIRECT, TRIG,  CHOLHDL No results found for: HGBA1C Lab Results  Component Value Date   VITAMINB12 >1999* 06/03/2013   Lab Results  Component Value Date   TSH 2.554 03/10/2013      ASSESSMENT AND PLAN  Peter [name] is a 63 y.o. male with past medical history of severe cervical myelopathy, with cord signal change, lumbar stenosis, status post cervical, lumbar decompression surgery, right carpal tunnel release surgery, now with persistent bilateral hands muscle atrophy, upper and lower extremity weakness, gait difficulty, which is residual neurological deficit from previous chronic long-term cervical, and lumbar stenosis.  He is to continue moderate exercise, Return to clinic in 3-4 month   Marcial Pacas, M.D. Ph.D.  [facility] [address] [place], [place] [address] [phone]

## 2014-08-24 NOTE — Progress Notes (Unsigned)
PATIENT: Peter [name] DOB: 11-Sep-1951  HISTORICAL  Peter [name]   Sept 2014, He had decompression surgery  By Dr. Cyndy [name], Lumbar and cervical 07/2013, in 2014, and Jan 2015,[name],   This is a marked abnormal study. There is electrodiagnostic evidence of active neuropathic changes involving bilateral cervical, and lumbosacral myotomes. In addition, there is evidence of length dependent mild axonal peripheral neuropathy, moderate right carpal tunnel syndrome.  Differentiation diagnosis of above findings include motor neuron disease, paraneoplastic syndrome, nutritional deficiency, infectious, inflammatory etiology.  Extensive evaluation has been initiated, this including MRI of neuraxis, laboratory evaluation, he will return to clinic in one month.   Dr. Fredna [name], in Nov  He had right CTS in 03/20/2014, which did helped his right   HE has improved use of righ thand.   Therapist in Dr. Fredna [name]   Was lost i slost, stop further deteriations,   He also has left hand clumbsiness, , but is better thatn his right hand, he has avodied left hand surgery,  t is not getting sores,   He heas right hadn deeteriration.  He was evaluated by Dr. Excell [name] since March 18 2014, repeat nerve conduction EMG in November fourth 2015 by Dr. Monico [name] office, this is cost right carpal tunnel release surgery, this showed right median motor distal latency 5.1 ms, amplitude 3.3 microvoltage, in comparison, left median motor distal latency 4.5, amplitude 3.0 microvoltage, right median sensory response showed mildly prolonged peak latency, 2.8, normal being less than 2.4, amplitude 13.3, normal being more than 40,   with well-preserved left ulnar sensory, and motor response, right ulnar motor response showed significantly decreased C map amplitude, in comparison to the left side,   He is continues to hav signifaicnt balance difficulty, but halter progression, before suery the has progression,  before suery, he has ginfiant low back p and mild neck pain, pain all over his body, pain has much improved, well managed, now with   Overall he feels better,s,   He is limited.  He has night times sole of his feet, up to ankles, have numbness, tingling, did not have trouble sleeping, in the bottom of his feet, does not bother him during the day, fingers   Weakness right wore than left.   h e has constipations, opeionad,percocet 5/325 bid, tylenl'  He  Is off surgery, disabilitied, , he was a meachnist.      EVIEW OF SYSTEMS: Full 14 system review of systems performed and notable only for ***  ALLERGIES: Allergies  Allergen Reactions  . Keflex [Cephalexin] Anaphylaxis    HOME MEDICATIONS: Current Outpatient Prescriptions on File Prior to Visit  Medication Sig Dispense Refill  . Multiple Vitamin (MULTIVITAMIN WITH MINERALS) TABS Take 1 tablet by mouth daily.     No current facility-administered medications on file prior to visit.    PAST MEDICAL HISTORY: Past Medical History  Diagnosis Date  . Allergy   . Asthma   . Hypertension   . Substance abuse     not in many years  . Acute combined systolic and diastolic heart failure 2/72/5366  . Shortness of breath     with asthma  . Arthritis   . Cardiomyopathy   . Complication of anesthesia     problems urinating after anesthesia  . Hypothyroidism   . Wears glasses   . Wears partial dentures     top  . Back pain   . MRSA (methicillin resistant Staphylococcus aureus) infection     lumbar-2015  .  Fatigue due to treatment   . Hypersomnia due to drug     PAST SURGICAL HISTORY: Past Surgical History  Procedure Laterality Date  . Cardiac catheterization  2014  . Anterior cervical decomp/discectomy fusion N/A 07/30/2013    Procedure: CERVICAL FIVE,CERVICAL SIX CORPECTOMY,ANTERIOR ARTHRODESIS,PEEK INTERBODY GRAFT CERVICAL FOUR-CERVICAL SEVEN,ANTERIOR INSTRUMENTATION;  Surgeon: Peter Cunas, MD;  Location: [facility];  Service: Neurosurgery;  Laterality: N/A;  . Lumbar wound debridement N/A 11/07/2013    Procedure: LUMBAR WOUND DEBRIDEMENT;  Surgeon: Peter Cunas, MD;  Location: [facility];  Service: Neurosurgery;  Laterality: N/A;  . Tonsillectomy  1958  . Back surgery  1/15    lumb fusion  . Colonoscopy  2003  . Carpal tunnel release Right 03/23/2014    Procedure: RIGHT CARPAL TUNNEL RELEASE;  Surgeon: Peter Must, MD;  Location: [place];  Service: Orthopedics;  Laterality: Right;    FAMILY HISTORY: Family History  Problem Relation Age of Onset  . Colon cancer Neg Hx     SOCIAL HISTORY:  History   Social History  . Marital Status: Married    Spouse Name: Peter [name]    Number of Children: 4  . Years of Education: College   Occupational History  .     Social History Main Topics  . Smoking status: Former Smoker -- 20 years    Quit date: 08/14/1995  . Smokeless tobacco: Never Used  . Alcohol Use: No  . Drug Use: No  . Sexual Activity: Yes   Other Topics Concern  . 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   Patient has four adult children.   Patient is disabled.   Patient has college education.   Patient is right-handed.   Patient drinks very little caffeine.     PHYSICAL EXAM   There were no vitals filed for this visit.  Not recorded      There is no weight on file to calculate BMI.   Generalized: In no acute distress  Neck: Supple, no carotid bruits   Cardiac: Regular rate rhythm  Pulmonary: Clear to auscultation bilaterally  Musculoskeletal: No deformity  Neurological examination  Mentation: Alert oriented to time, place, history taking, and causual conversation  Cranial nerve II-XII: Pupils were equal round reactive to light. Extraocular movements were full.  Visual field were full on confrontational test. Bilateral fundi were sharp.  Facial sensation and strength were  normal. Hearing was intact to finger rubbing bilaterally. Uvula tongue midline.  Head turning and shoulder shrug and were normal and symmetric.Tongue protrusion into cheek strength was normal.  Motor: Normal tone, bulk and strength.  Sensory: Intact to fine touch, pinprick, preserved vibratory sensation, and proprioception at toes.  Coordination: Normal finger to nose, heel-to-shin bilaterally there was no truncal ataxia  Gait: Rising up from seated position without assistance, normal stance, without trunk ataxia, moderate stride, good arm swing, smooth turning, able to perform tiptoe, and heel walking without difficulty.   Romberg signs: Negative  Deep tendon reflexes: Brachioradialis 2/2, biceps 2/2, triceps 2/2, patellar 2/2, Achilles 2/2, plantar responses were flexor bilaterally.   DIAGNOSTIC DATA (LABS, IMAGING, TESTING) - I reviewed patient records, labs, notes, testing and imaging myself where available.  Lab Results  Component Value Date   WBC 5.2 12/29/2013   HGB 10.5* 12/29/2013   HCT 32.2* 12/29/2013   MCV 79.1 12/29/2013   PLT 329 12/29/2013  Component Value Date/Time   NA 140 03/20/2014 1110   K 4.1 03/20/2014 1110   CL 100 03/20/2014 1110   CO2 26 03/20/2014 1110   GLUCOSE 99 03/20/2014 1110   BUN 10 03/20/2014 1110   CREATININE 0.79 03/20/2014 1110   CALCIUM 9.5 03/20/2014 1110   PROT 7.5 12/29/2013 1944   PROT 7.1 06/03/2013 1039   ALBUMIN 3.7 12/29/2013 1944   AST 18 12/29/2013 1944   ALT 12 12/29/2013 1944   ALKPHOS 92 12/29/2013 1944   BILITOT 0.4 12/29/2013 1944   GFRNONAA >90 03/20/2014 1110   GFRAA >90 03/20/2014 1110   No results found for: CHOL, HDL, LDLCALC, LDLDIRECT, TRIG, CHOLHDL No results found for: HGBA1C Lab Results  Component Value Date   VITAMINB12 >1999* 06/03/2013   Lab Results  Component Value Date   TSH 2.554 03/10/2013      ASSESSMENT AND PLAN  Abdirizak Richison is a 63 y.o. male complains of     Marcial Pacas,  M.D. Ph.D.sensory peak latency  [facility] [address] [place], [place] [address] [phone]

## 2014-09-03 ENCOUNTER — Encounter (HOSPITAL_COMMUNITY): Payer: Self-pay | Admitting: Cardiology

## 2014-09-07 ENCOUNTER — Ambulatory Visit (INDEPENDENT_AMBULATORY_CARE_PROVIDER_SITE_OTHER): Payer: 59 | Admitting: Neurology

## 2014-09-07 DIAGNOSIS — G4731 Primary central sleep apnea: Secondary | ICD-10-CM

## 2014-09-07 DIAGNOSIS — G4733 Obstructive sleep apnea (adult) (pediatric): Secondary | ICD-10-CM

## 2014-09-07 DIAGNOSIS — G4739 Other sleep apnea: Secondary | ICD-10-CM

## 2014-09-07 NOTE — Sleep Study (Signed)
Please see the scanned sleep study interpretation located in the Procedure tab within the Chart Review section. 

## 2014-09-22 ENCOUNTER — Other Ambulatory Visit: Payer: Self-pay | Admitting: Neurology

## 2014-09-22 ENCOUNTER — Encounter: Payer: Self-pay | Admitting: *Deleted

## 2014-09-22 ENCOUNTER — Telehealth: Payer: Self-pay | Admitting: *Deleted

## 2014-09-22 ENCOUNTER — Encounter: Payer: Self-pay | Admitting: Neurology

## 2014-09-22 DIAGNOSIS — G4731 Primary central sleep apnea: Secondary | ICD-10-CM | POA: Insufficient documentation

## 2014-09-22 DIAGNOSIS — G4733 Obstructive sleep apnea (adult) (pediatric): Secondary | ICD-10-CM

## 2014-09-22 HISTORY — DX: Obstructive sleep apnea (adult) (pediatric): G47.33

## 2014-09-22 NOTE — Telephone Encounter (Signed)
Patient was contacted and provided the results of his CPAP titration study.  Patient was informed that CPAP therapy had been recommended for home use by Dr. Brett Fairy.  Patient was in agreement and was referred to [facility] for CPAP set up.  Patient was mailed a copy of the sleep test results and Dr. Einar Gip was faxed a copy of the report.   Patient instructed to contact our office 6-8 weeks post set up to schedule a follow up appointment.

## 2014-11-25 ENCOUNTER — Ambulatory Visit (INDEPENDENT_AMBULATORY_CARE_PROVIDER_SITE_OTHER): Payer: 59 | Admitting: Neurology

## 2014-11-25 ENCOUNTER — Encounter: Payer: Self-pay | Admitting: Neurology

## 2014-11-25 VITALS — BP 104/69 | HR 87 | Ht 74.0 in | Wt 233.0 lb

## 2014-11-25 DIAGNOSIS — G5602 Carpal tunnel syndrome, left upper limb: Secondary | ICD-10-CM | POA: Diagnosis not present

## 2014-11-25 DIAGNOSIS — M4316 Spondylolisthesis, lumbar region: Secondary | ICD-10-CM

## 2014-11-25 DIAGNOSIS — G5601 Carpal tunnel syndrome, right upper limb: Secondary | ICD-10-CM | POA: Diagnosis not present

## 2014-11-25 DIAGNOSIS — G5603 Carpal tunnel syndrome, bilateral upper limbs: Secondary | ICD-10-CM

## 2014-11-25 DIAGNOSIS — M4712 Other spondylosis with myelopathy, cervical region: Secondary | ICD-10-CM

## 2014-11-25 NOTE — Progress Notes (Signed)
[name] is a 64 years old right-handed male, follow-up for his continued difficulty following his cervical and lumbar decompression surgery, his primary care physician is Dr. Shon Baton.  I saw him for the first time at EMG nerve conduction study in September 2014, he presented with progressive bilateral upper and lower extremity muscle wasting, weakness, he was found to have significant bilateral intrinsic hand muscle atrophy, upper extremity weakness, right worse than left, also mild right lower extremity weakness, with lower extremity hyperreflexia. Electrodiagnostic study has demonstrate active bilateral cervical, lumbar sacral neuropathic changes, there was also evidence of mild axonal peripheral neuropathy, moderate right carpal tunnel syndrome.  He had extensive MRI study: MRI cervical spine Sep 2014 demonstrated severe cord compression with T2 hyperintense signal, mainly at C7 to T1 levels. C6-7: disc bulging, uncovertebral joint hypertrophy and facet hypertrophy with severe spinal stenosis (24mm) and severe biforaminal foraminal stenosis; T2 hyperintense cord signal at C7. C7-T1: disc bulging and facet hypertrophy with moderate spinal stenosis and severe biforaminal foraminal stenosis; T2 hyperintense cord signal extending to T1 level. C5-6: disc bulging, uncovertebral joint hypertrophy and facet hypertrophy with moderate-severe spinal stenosis (3mm) and severe biforaminal foraminal stenosis  MRI lumbar Sept 2014, spine showed severe spinal stenosis and lateral foraminal stenosis at L4-5, L5-S1: disc bulging and facet hypertrophy with severe biforaminal foraminal stenosis   There was no significant abnormality at MRI of the brain, and MRI of thoracic spine in Sep 2014  He underwent cervical 5, 6, corpectomy, anterior arthrodesis, interbody graft, cervical 4-7 anterior instrumentation by Dr. Cyndy Freeze in Nov 2014,  Lumbar L4-5 decompression with posterior lumbar interbody fusion  with interbody Peek prostheses 33mm morselized autograft in January 2015  Posta surgically, he has no significant worsening of his functional status, but continued gait difficulty, bilateral hands muscle weakness, especially his right hand,  He had a repeat nerve conduction EMG in May 2015, Repeat study continued to demonstrate active right cervical, lumbosacral radiculopathy, severe bilateral carpal tunnel syndromes.  He underwent right carpal tunnel release surgery by Dr. Fredna Dow in June 2015, his right hand paresthesia has improved, but continued to have profound right hand muscle atrophy, weakness  He is now ambulate with mild difficulty, but does not need assistant. He is now on disability, he used to work as a Furniture conservator/restorer   UPDATE March 2nd 2016: His right hand remain weak, not getting worse, now he has progressive left hand numbness and weakness, decreased left grip, he could not feel well at his left first 3 fingers,   EMG/NCA in Nov 2015 by Dr. Levell July office, left median motor response showed mildly prolonged distal latency 4.5 ms, in comparison of the normal is less than 4.0, amplitude was decreased 3.0, Left median sensory latency was mildly prolonged 2.5 ms, with decreased amplitude 26.7,   EVIEW OF SYSTEMS: Full 14 system review of systems performed and notable only for fatigue, apnea, weakness  ALLERGIES: Allergies  Allergen Reactions  . Keflex [Cephalexin] Anaphylaxis    HOME MEDICATIONS: Current Outpatient Prescriptions on File Prior to Visit  Medication Sig Dispense Refill  . Multiple Vitamin (MULTIVITAMIN WITH MINERALS) TABS Take 1 tablet by mouth daily.     No current facility-administered medications on file prior to visit.    PAST MEDICAL HISTORY: Past Medical History  Diagnosis Date  . Allergy   . Asthma   . Hypertension   . Substance abuse     not in many years  . Acute combined systolic and diastolic heart  failure 03/11/2013  . Shortness of breath      with asthma  . Arthritis   . Cardiomyopathy   . Complication of anesthesia     problems urinating after anesthesia  . Hypothyroidism   . Wears glasses   . Wears partial dentures     top  . Back pain   . MRSA (methicillin resistant Staphylococcus aureus) infection     lumbar-2015  . Fatigue due to treatment   . Hypersomnia due to drug     PAST SURGICAL HISTORY: Past Surgical History  Procedure Laterality Date  . Cardiac catheterization  2014  . Anterior cervical decomp/discectomy fusion N/A 07/30/2013    Procedure: CERVICAL FIVE,CERVICAL SIX CORPECTOMY,ANTERIOR ARTHRODESIS,PEEK INTERBODY GRAFT CERVICAL FOUR-CERVICAL SEVEN,ANTERIOR INSTRUMENTATION;  Surgeon: Winfield Cunas, MD;  Location: [facility];  Service: Neurosurgery;  Laterality: N/A;  . Lumbar wound debridement N/A 11/07/2013    Procedure: LUMBAR WOUND DEBRIDEMENT;  Surgeon: Winfield Cunas, MD;  Location: [facility];  Service: Neurosurgery;  Laterality: N/A;  . Tonsillectomy  1958  . Back surgery  1/15    lumb fusion  . Colonoscopy  2003  . Carpal tunnel release Right 03/23/2014    Procedure: RIGHT CARPAL TUNNEL RELEASE;  Surgeon: Tennis Must, MD;  Location: [place] [facility];  Service: Orthopedics;  Laterality: Right;    FAMILY HISTORY: Family History  Problem Relation Age of Onset  . Colon cancer Neg Hx     SOCIAL HISTORY:  History   Social History  . Marital Status: Married    Spouse Name: Rise Paganini    Number of Children: 4  . Years of Education: College   Occupational History  .     Social History Main Topics  . Smoking status: Former Smoker -- 20 years    Quit date: 08/14/1995  . Smokeless tobacco: Never Used  . Alcohol Use: No  . Drug Use: No  . Sexual Activity: Yes   Other Topics Concern  . 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   Patient has four adult children.   Patient is disabled.   Patient has college  education.   Patient is right-handed.   Patient drinks very little caffeine.     PHYSICAL EXAM  PHYSICAL EXAMNIATION:  Gen: NAD, conversant, well nourised, obese, well groomed                     Cardiovascular: Regular rate rhythm, no peripheral edema, warm, nontender. Eyes: Conjunctivae clear without exudates or hemorrhage Neck: Supple, no carotid bruise. Pulmonary: Clear to auscultation bilaterally   NEUROLOGICAL EXAM:  MENTAL STATUS: Speech:    Speech is normal; fluent and spontaneous with normal comprehension.  Cognition:    The patient is oriented to person, place, and time;     recent and remote memory intact;     language fluent;     normal attention, concentration,     fund of knowledge.  CRANIAL NERVES: CN II: Visual fields are full to confrontation. Fundoscopic exam is normal with sharp discs and no vascular changes. Venous pulsations are present bilaterally. Pupils are 4 mm and briskly reactive to light. Visual acuity is 20/20 bilaterally. CN III, IV, VI: extraocular movement are normal. No ptosis. CN V: Facial sensation is intact to pinprick in all 3 divisions bilaterally. Corneal responses are intact.  CN VII: Face is symmetric with normal eye closure and  smile. CN VIII: Hearing is normal to rubbing fingers CN IX, X: Palate elevates symmetrically. Phonation is normal. CN XI: Head turning and shoulder shrug are intact CN XII: Tongue is midline with normal movements and no atrophy.  MOTOR: There is no pronator drift of out-stretched arms. right intrinsic hand muscle atrophy,   Shoulder abduction Shoulder external rotation Elbow flexion Elbow extension Wrist flexion Wrist extension Finger abduction Hip flexion Knee flexion Knee extension Ankle dorsi flexion Ankle plantar flexion  R 4 4 4 4 4 4 4 5 5 5 4 5   L 5- 5- 5- 5- 5- 5-- 5- 5 5 5  5- 5    REFLEXES: Reflexes are 2+ and symmetric at the biceps, triceps, knees, and ankles. Plantar responses are  flexor.  SENSORY: Decreased Light touch, pinprick, at bilateral first 4 fingers  CORDINATION: Rapid alternating movements and fine finger movements are intact. There is no dysmetria on finger-to-nose and heel-knee-shin. There are no abnormal or extraneous movements.   GAIT/STANCE: Stiff, dragging her right leg.   DIAGNOSTIC DATA (LABS, IMAGING, TESTING) - I reviewed patient records, labs, notes, testing and imaging myself where available.  Lab Results  Component Value Date   WBC 5.2 12/29/2013   HGB 10.5* 12/29/2013   HCT 32.2* 12/29/2013   MCV 79.1 12/29/2013   PLT 329 12/29/2013      Component Value Date/Time   NA 140 03/20/2014 1110   K 4.1 03/20/2014 1110   CL 100 03/20/2014 1110   CO2 26 03/20/2014 1110   GLUCOSE 99 03/20/2014 1110   BUN 10 03/20/2014 1110   CREATININE 0.79 03/20/2014 1110   CALCIUM 9.5 03/20/2014 1110   PROT 7.5 12/29/2013 1944   PROT 7.1 06/03/2013 1039   ALBUMIN 3.7 12/29/2013 1944   AST 18 12/29/2013 1944   ALT 12 12/29/2013 1944   ALKPHOS 92 12/29/2013 1944   BILITOT 0.4 12/29/2013 1944   GFRNONAA >90 03/20/2014 1110   GFRAA >90 03/20/2014 1110   No results found for: CHOL, HDL, LDLCALC, LDLDIRECT, TRIG, CHOLHDL No results found for: HGBA1C Lab Results  Component Value Date   VITAMINB12 >1999* 06/03/2013   Lab Results  Component Value Date   TSH 2.554 03/10/2013      ASSESSMENT AND PLAN Trey Giannini is a 64 y.o. male with past medical history of severe cervical myelopathy, with cord signal change, lumbar stenosis, status post cervical, lumbar decompression surgery, right carpal tunnel release surgery, now with persistent bilateral hands muscle atrophy, upper and lower extremity weakness, gait difficulty, which is residual neurological deficit from previous chronic long-term cervical, and lumbar stenosis.  He also has severe bilateral carpal tunnel syndrome based on his symptoms, and electrodiagnostic study, his right hand  paresthesia, and the weakness has been stabilized since right carpal tunnel release surgery in June 2015, he continue has left hands worsening numbness, weak grip,   1, I have suggested him follow-up with hand surgeon Dr. Fredna Dow to consider potential left carpal tunnel release surgery,   2, he had significant residual bilateral upper and lower extremity weakness, right worse than left, gait difficulty, continue moderate exercise  Return to clinic for new issues    Marcial Pacas, M.D. Ph.D.  [facility] [address] [place], [place] [address] [phone]

## 2015-02-26 IMAGING — DX DG CERVICAL SPINE 2 OR 3 VIEWS
1 series · 1 of 1 positions shown · non-contrast
Comparison: None.

CLINICAL DATA: C4-C7 ACDF.

EXAM:
CERVICAL SPINE - 2-3 VIEW

[lat]
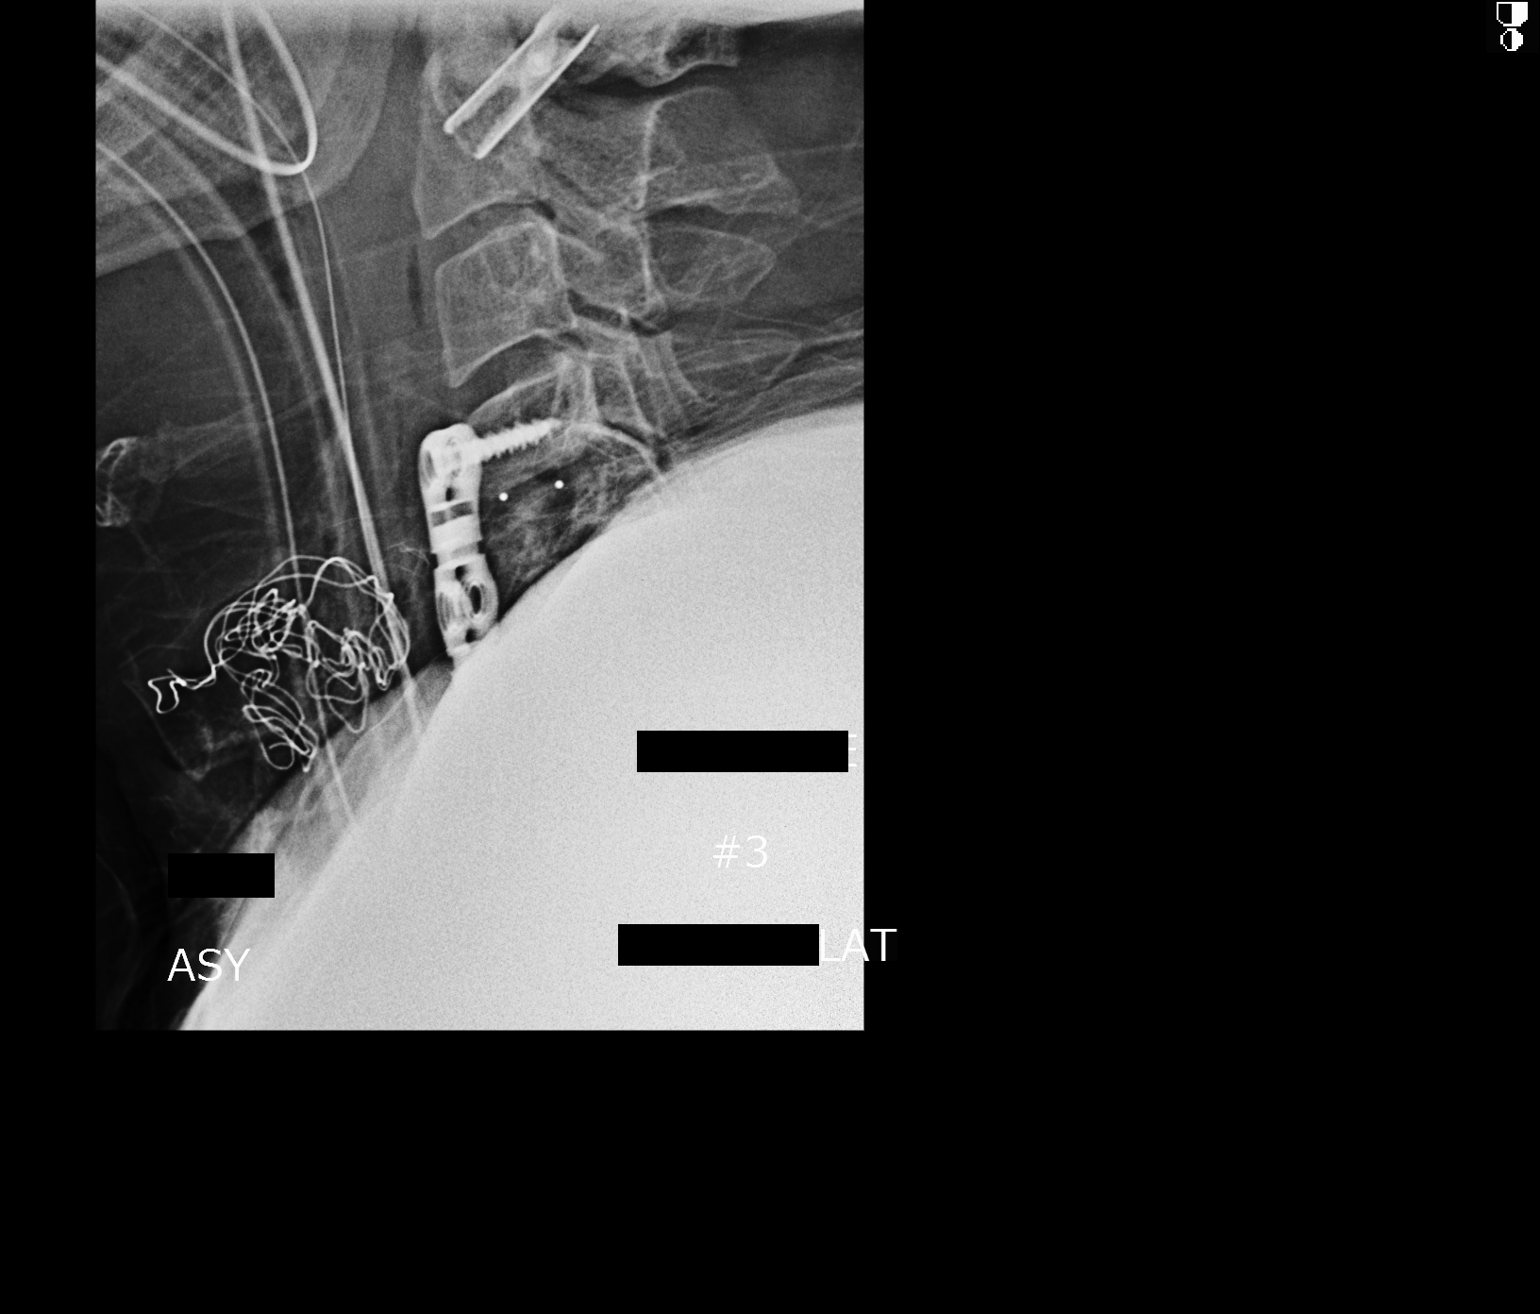

[1 of 1 positions shown; findings below may reference images not displayed]

FINDINGS: A surgical device tip projects at the level of the C4-5 disc space.
Partial visualization of the anterior fusion plate and screw fixator
is appreciated at C4 and C5. An NG tube and nasogastric tube are
appreciated.
IMPRESSION: C4 through C7 ACDF

## 2015-04-08 ENCOUNTER — Other Ambulatory Visit: Payer: Self-pay | Admitting: Neurosurgery

## 2015-04-14 ENCOUNTER — Inpatient Hospital Stay (HOSPITAL_COMMUNITY)
Admission: RE | Admit: 2015-04-14 | Discharge: 2015-04-14 | Disposition: A | Discharge status: Home / Self Care | Payer: Self-pay | Source: Ambulatory Visit

## 2015-04-14 NOTE — Progress Notes (Signed)
Followed up with patient about missed PAT appointment.  Patient stated that he has been taking prescribed Percocet and it has caused constipation and Dr. Christella Noa was aware.  Pt. States that he missed his PAT appointment today due to relief of constipation and he really wants to have his surgery on Friday."  Patient also states that he has been in touch with his PCP.

## 2015-04-14 NOTE — Progress Notes (Signed)
Patient did not show up for PAT appointment.  Patient's wife called and informed nurse that patient has been feeling light headed, has been nauseous, has been in the bathroom all day, and has chills.  Patient's wife, Rise Paganini, stated that she was going to call his PCP's office to see if he could be seen.    Spoke with Lorriane Shire at Dr. Lacy Duverney office who stated she was going to contact the patient.

## 2015-04-15 ENCOUNTER — Encounter (HOSPITAL_COMMUNITY): Payer: Self-pay | Admitting: *Deleted

## 2015-04-15 MED ORDER — MUPIROCIN 2 % EX OINT
1.0000 "application " | TOPICAL_OINTMENT | CUTANEOUS | Status: AC
  Administered 2015-04-16: 1 via TOPICAL
  Filled 2015-04-15: qty 22

## 2015-04-15 MED ORDER — VANCOMYCIN HCL IN DEXTROSE 1-5 GM/200ML-% IV SOLN
1000.0000 mg | INTRAVENOUS | Status: AC
  Administered 2015-04-16: 1000 mg via INTRAVENOUS
  Filled 2015-04-15: qty 200

## 2015-04-15 NOTE — Progress Notes (Signed)
Anesthesia Chart Review: SAME DAY WORK-UP. He was a no show for his PAT appointment--had issues with constipation that had to be resolved before his surgery.  Patient is a 64 year old male scheduled for C3-4 ACDF on 04/16/15 by Dr. Christella Noa.  History includes former smoker, HTN, substance abuse (no longer), asthma, acute combined systolic and diastolic CHF, non-ischemic dilated cardiomyopathy ("viral" or "idopathic") '14, post-operative urinary retention, MRSA '15, OSA with CPAP use, hypothyroidism (Graves disease s/p ablation), C5-6 ACDF '14, I7-7 PLIF '15 complicated by post-operative wound infection. PCP is Dr. Shon Baton.  Cardiologist is Dr. Einar Gip, last visit 12/11/14. By notes, appeared to be doing well from a cardiac standpoint. Had just started CPAP. Six month follow-up planned. No new testing ordered.  Meds include albuterol, ASA, Coreg, Lasix, Bidil, levothyroxine, Percocet.  10/23/14 EKG [facility] CV): NSR, cannot rule out septal infarct (age undetermined).  05/28/14 Echo [facility] CV): LV cavity is normal in size. Moderate concentric hypertrophy. Mild decrease in global wall motion. Mildly depressed systolic function. Calculated EF 46%. LA cavity is normal in size. An aneurysm with possible PFO is present. Mild MR. Mild TR. No evidence of pulmonary hypertension.   03/11/13 Cardiac cath:  Hemodynamic data: -Left ventricular pressure was 104/10with LVEDP of 16 mm mercury. Aortic pressure was 107/80 with a mean of 92 mm mercury. There was no pressure gradient across the aortic valve.  -Left ventricle: Performed in the RAO projection revealed LVEF of 20% Dilated LV. Global hypokinesis noted.  -Right coronary artery: The vessel is smooth with mild luminal irregularity. It is dominant. No significant stenosis. -Left main coronary artery is large and normal. Next -Circumflex coronary artery: A large vessel giving origin to a large obtuse marginal 1.  -LAD: LAD gives origin to a large diagonal 1.  Normal. .  -Right Heart Procedural data:  RA pressure 12/9 Mean 10 mm mercury. RA saturation 28%.  RV pressure 10/13 and Right ventricular EDP 13 mm Hg. PA pressure 29/14 with a mean of 22 mm mercury. PA saturation 60%.  Pulmonary capillary wedge 16/16 with a mean of 15 mm Hg. Aortic saturation 96%.  Cardiac output was 4.68 with cardiac index of 2.03 by Fick.  Impression: Normal right heart catheterizaton with preserved cardiac output and cardiac index. No suggestion of intracardiac shunting with normal Qp:Qs ratio. Normal coronary arteries, findings are suggestive of nonischemic dilated cardiomyopathy severe LV systolic dysfunction. Borderline decrease in cardiac output and cardiac index in a patient who is 6 feet 2 inches tall.  He is for labs on arrival.   He has had recent cardiology visit with echo within the past year. Labs and anesthesiologist evaluation tomorrow.  If labs are stable and no acute changes then I would anticipate that he could proceed as planned.  [facility] Short Stay Center/Anesthesiology Phone [phone] 04/15/2015 4:43 PM

## 2015-04-15 NOTE — Progress Notes (Signed)
Requested EKG, Echo and last OV notes from Dr. Einar Gip to be faxed. Pt denies any chest pain. Has sob at times with his asthma.

## 2015-04-16 ENCOUNTER — Ambulatory Visit (HOSPITAL_COMMUNITY): Payer: 59

## 2015-04-16 ENCOUNTER — Encounter (HOSPITAL_COMMUNITY)
Admission: RE | Disposition: A | Discharge status: Home / Self Care | Payer: Self-pay | Source: Ambulatory Visit | Attending: Neurosurgery

## 2015-04-16 ENCOUNTER — Ambulatory Visit (HOSPITAL_COMMUNITY): Payer: 59 | Admitting: Vascular Surgery

## 2015-04-16 ENCOUNTER — Inpatient Hospital Stay (HOSPITAL_COMMUNITY)
Admission: RE | Admit: 2015-04-16 | Discharge: 2015-04-18 | DRG: 472 | Disposition: A | Discharge status: Home / Self Care | Payer: 59 | Source: Ambulatory Visit | Attending: Neurosurgery | Admitting: Neurosurgery

## 2015-04-16 ENCOUNTER — Encounter (HOSPITAL_COMMUNITY): Payer: Self-pay | Admitting: General Practice

## 2015-04-16 DIAGNOSIS — J45909 Unspecified asthma, uncomplicated: Secondary | ICD-10-CM | POA: Diagnosis present

## 2015-04-16 DIAGNOSIS — M5021 Other cervical disc displacement,  high cervical region: Secondary | ICD-10-CM | POA: Diagnosis present

## 2015-04-16 DIAGNOSIS — I5042 Chronic combined systolic (congestive) and diastolic (congestive) heart failure: Secondary | ICD-10-CM | POA: Diagnosis present

## 2015-04-16 DIAGNOSIS — Z8614 Personal history of Methicillin resistant Staphylococcus aureus infection: Secondary | ICD-10-CM | POA: Diagnosis not present

## 2015-04-16 DIAGNOSIS — G4733 Obstructive sleep apnea (adult) (pediatric): Secondary | ICD-10-CM | POA: Diagnosis present

## 2015-04-16 DIAGNOSIS — M542 Cervicalgia: Secondary | ICD-10-CM | POA: Diagnosis present

## 2015-04-16 DIAGNOSIS — M5020 Other cervical disc displacement, unspecified cervical region: Secondary | ICD-10-CM | POA: Diagnosis present

## 2015-04-16 DIAGNOSIS — E890 Postprocedural hypothyroidism: Secondary | ICD-10-CM | POA: Diagnosis present

## 2015-04-16 DIAGNOSIS — Z419 Encounter for procedure for purposes other than remedying health state, unspecified: Secondary | ICD-10-CM

## 2015-04-16 DIAGNOSIS — Z79899 Other long term (current) drug therapy: Secondary | ICD-10-CM | POA: Diagnosis not present

## 2015-04-16 DIAGNOSIS — Z7982 Long term (current) use of aspirin: Secondary | ICD-10-CM

## 2015-04-16 DIAGNOSIS — I10 Essential (primary) hypertension: Secondary | ICD-10-CM | POA: Diagnosis present

## 2015-04-16 DIAGNOSIS — Z981 Arthrodesis status: Secondary | ICD-10-CM | POA: Diagnosis not present

## 2015-04-16 DIAGNOSIS — Z87891 Personal history of nicotine dependence: Secondary | ICD-10-CM

## 2015-04-16 HISTORY — PX: ANTERIOR CERVICAL DECOMP/DISCECTOMY FUSION: SHX1161

## 2015-04-16 LAB — SURGICAL PCR SCREEN
MRSA, PCR: NEGATIVE
Staphylococcus aureus: POSITIVE — AB

## 2015-04-16 LAB — BASIC METABOLIC PANEL
ANION GAP: 9 (ref 5–15)
BUN: 12 mg/dL (ref 6–20)
CO2: 27 mmol/L (ref 22–32)
Calcium: 9.2 mg/dL (ref 8.9–10.3)
Chloride: 102 mmol/L (ref 101–111)
Creatinine, Ser: 0.98 mg/dL (ref 0.61–1.24)
GFR calc Af Amer: >60 mL/min (ref >60)
GLUCOSE: 101 mg/dL — AB (ref 65–99)
Potassium: 3.6 mmol/L (ref 3.5–5.1)
SODIUM: 138 mmol/L (ref 135–145)

## 2015-04-16 LAB — CBC
HEMATOCRIT: 40.2 % (ref 39.0–52.0)
Hemoglobin: 13.6 g/dL (ref 13.0–17.0)
MCH: 28.6 pg (ref 26.0–34.0)
MCHC: 33.8 g/dL (ref 30.0–36.0)
MCV: 84.5 fL (ref 78.0–100.0)
Platelets: 204 10*3/uL (ref 150–400)
RBC: 4.76 MIL/uL (ref 4.22–5.81)
RDW: 13.9 % (ref 11.5–15.5)
WBC: 4.3 10*3/uL (ref 4.0–10.5)

## 2015-04-16 SURGERY — ANTERIOR CERVICAL DECOMPRESSION/DISCECTOMY FUSION 1 LEVEL
Anesthesia: General | Site: Neck

## 2015-04-16 MED ORDER — OXYCODONE-ACETAMINOPHEN 5-325 MG PO TABS
ORAL_TABLET | ORAL | Status: AC
  Filled 2015-04-16: qty 2

## 2015-04-16 MED ORDER — 0.9 % SODIUM CHLORIDE (POUR BTL) OPTIME
TOPICAL | Status: DC | PRN
  Administered 2015-04-16: 1000 mL

## 2015-04-16 MED ORDER — MORPHINE SULFATE 2 MG/ML IJ SOLN
1.0000 mg | INTRAMUSCULAR | Status: DC | PRN
Start: 2015-04-16 — End: 2015-04-18

## 2015-04-16 MED ORDER — LIDOCAINE-EPINEPHRINE 0.5 %-1:200000 IJ SOLN
INTRAMUSCULAR | Status: DC | PRN
  Administered 2015-04-16: 4 mL

## 2015-04-16 MED ORDER — ACETAMINOPHEN 10 MG/ML IV SOLN
INTRAVENOUS | Status: AC
  Filled 2015-04-16: qty 100

## 2015-04-16 MED ORDER — LIDOCAINE HCL (CARDIAC) 20 MG/ML IV SOLN
INTRAVENOUS | Status: DC | PRN
  Administered 2015-04-16: 80 mg via INTRAVENOUS

## 2015-04-16 MED ORDER — FENTANYL CITRATE (PF) 100 MCG/2ML IJ SOLN
INTRAMUSCULAR | Status: DC | PRN
  Administered 2015-04-16 (×2): 50 ug via INTRAVENOUS
  Administered 2015-04-16: 100 ug via INTRAVENOUS
  Administered 2015-04-16 (×3): 50 ug via INTRAVENOUS

## 2015-04-16 MED ORDER — GLYCOPYRROLATE 0.2 MG/ML IJ SOLN
INTRAMUSCULAR | Status: AC
  Filled 2015-04-16: qty 4

## 2015-04-16 MED ORDER — ROCURONIUM BROMIDE 50 MG/5ML IV SOLN
INTRAVENOUS | Status: AC
  Filled 2015-04-16: qty 1

## 2015-04-16 MED ORDER — DEXAMETHASONE SODIUM PHOSPHATE 4 MG/ML IJ SOLN
INTRAMUSCULAR | Status: DC | PRN
  Administered 2015-04-16: 6 mg via INTRAVENOUS

## 2015-04-16 MED ORDER — SUCCINYLCHOLINE CHLORIDE 20 MG/ML IJ SOLN
INTRAMUSCULAR | Status: DC | PRN
  Administered 2015-04-16: 110 mg via INTRAVENOUS

## 2015-04-16 MED ORDER — PROPOFOL 10 MG/ML IV BOLUS
INTRAVENOUS | Status: AC
  Filled 2015-04-16: qty 20

## 2015-04-16 MED ORDER — GLYCOPYRROLATE 0.2 MG/ML IJ SOLN
INTRAMUSCULAR | Status: DC | PRN
  Administered 2015-04-16: .8 mg via INTRAVENOUS

## 2015-04-16 MED ORDER — PROMETHAZINE HCL 25 MG/ML IJ SOLN: 6.2500 mg | INTRAMUSCULAR | Status: DC | PRN

## 2015-04-16 MED ORDER — ALBUTEROL SULFATE HFA 108 (90 BASE) MCG/ACT IN AERS
INHALATION_SPRAY | RESPIRATORY_TRACT | Status: AC
  Filled 2015-04-16: qty 6.7

## 2015-04-16 MED ORDER — HEMOSTATIC AGENTS (NO CHARGE) OPTIME
TOPICAL | Status: DC | PRN
  Administered 2015-04-16: 1 via TOPICAL

## 2015-04-16 MED ORDER — CARVEDILOL 12.5 MG PO TABS
12.5000 mg | ORAL_TABLET | Freq: Two times a day (BID) | ORAL | Status: DC
  Administered 2015-04-16 – 2015-04-18 (×4): 12.5 mg via ORAL
  Filled 2015-04-16 (×4): qty 1

## 2015-04-16 MED ORDER — ONDANSETRON HCL 4 MG/2ML IJ SOLN
INTRAMUSCULAR | Status: AC
  Filled 2015-04-16: qty 2

## 2015-04-16 MED ORDER — STERILE WATER FOR INJECTION IJ SOLN
INTRAMUSCULAR | Status: AC
  Filled 2015-04-16: qty 10

## 2015-04-16 MED ORDER — ADULT MULTIVITAMIN W/MINERALS CH
1.0000 | ORAL_TABLET | Freq: Every day | ORAL | Status: DC
  Administered 2015-04-17 – 2015-04-18 (×2): 1 via ORAL
  Filled 2015-04-16 (×2): qty 1

## 2015-04-16 MED ORDER — PHENOL 1.4 % MT LIQD: 1.0000 | OROMUCOSAL | Status: DC | PRN

## 2015-04-16 MED ORDER — ARTIFICIAL TEARS OP OINT
TOPICAL_OINTMENT | OPHTHALMIC | Status: AC
  Filled 2015-04-16: qty 3.5

## 2015-04-16 MED ORDER — HYDROMORPHONE HCL 1 MG/ML IJ SOLN
INTRAMUSCULAR | Status: AC
  Filled 2015-04-16: qty 1

## 2015-04-16 MED ORDER — MIDAZOLAM HCL 5 MG/5ML IJ SOLN
INTRAMUSCULAR | Status: DC | PRN
Start: 2015-04-16 — End: 2015-04-16
  Administered 2015-04-16: 2 mg via INTRAVENOUS

## 2015-04-16 MED ORDER — FUROSEMIDE 20 MG PO TABS
20.0000 mg | ORAL_TABLET | Freq: Every day | ORAL | Status: DC
  Administered 2015-04-16 – 2015-04-17 (×2): 20 mg via ORAL
  Filled 2015-04-16 (×3): qty 1

## 2015-04-16 MED ORDER — VECURONIUM BROMIDE 10 MG IV SOLR
INTRAVENOUS | Status: DC | PRN
  Administered 2015-04-16: 2 mg via INTRAVENOUS
  Administered 2015-04-16: 1 mg via INTRAVENOUS
  Administered 2015-04-16: 4 mg via INTRAVENOUS
  Administered 2015-04-16: 1 mg via INTRAVENOUS

## 2015-04-16 MED ORDER — SUCCINYLCHOLINE CHLORIDE 20 MG/ML IJ SOLN
INTRAMUSCULAR | Status: AC
  Filled 2015-04-16: qty 1

## 2015-04-16 MED ORDER — B COMPLEX PO TABS: 1.0000 | ORAL_TABLET | Freq: Every day | ORAL | Status: DC

## 2015-04-16 MED ORDER — PROPOFOL 10 MG/ML IV BOLUS
INTRAVENOUS | Status: DC | PRN
  Administered 2015-04-16: 200 mg via INTRAVENOUS

## 2015-04-16 MED ORDER — NEOSTIGMINE METHYLSULFATE 10 MG/10ML IV SOLN
INTRAVENOUS | Status: AC
  Filled 2015-04-16: qty 1

## 2015-04-16 MED ORDER — LIDOCAINE HCL (CARDIAC) 20 MG/ML IV SOLN
INTRAVENOUS | Status: AC
  Filled 2015-04-16: qty 5

## 2015-04-16 MED ORDER — DEXAMETHASONE SODIUM PHOSPHATE 4 MG/ML IJ SOLN
INTRAMUSCULAR | Status: AC
  Filled 2015-04-16: qty 2

## 2015-04-16 MED ORDER — SODIUM CHLORIDE 0.9 % IJ SOLN
3.0000 mL | Freq: Two times a day (BID) | INTRAMUSCULAR | Status: DC
  Administered 2015-04-17: 3 mL via INTRAVENOUS

## 2015-04-16 MED ORDER — DEXAMETHASONE SODIUM PHOSPHATE 10 MG/ML IJ SOLN
INTRAMUSCULAR | Status: AC
  Filled 2015-04-16: qty 1

## 2015-04-16 MED ORDER — FENTANYL CITRATE (PF) 250 MCG/5ML IJ SOLN
INTRAMUSCULAR | Status: AC
  Filled 2015-04-16: qty 5

## 2015-04-16 MED ORDER — BISACODYL 5 MG PO TBEC: 5.0000 mg | DELAYED_RELEASE_TABLET | Freq: Every day | ORAL | Status: DC | PRN

## 2015-04-16 MED ORDER — ACETAMINOPHEN 325 MG PO TABS: 650.0000 mg | ORAL_TABLET | ORAL | Status: DC | PRN

## 2015-04-16 MED ORDER — ONDANSETRON HCL 4 MG/2ML IJ SOLN
INTRAMUSCULAR | Status: DC | PRN
  Administered 2015-04-16: 4 mg via INTRAVENOUS

## 2015-04-16 MED ORDER — ALBUTEROL SULFATE HFA 108 (90 BASE) MCG/ACT IN AERS
INHALATION_SPRAY | RESPIRATORY_TRACT | Status: DC | PRN
  Administered 2015-04-16: 4 via RESPIRATORY_TRACT
  Administered 2015-04-16: 5 via RESPIRATORY_TRACT
  Administered 2015-04-16: 2 via RESPIRATORY_TRACT

## 2015-04-16 MED ORDER — SODIUM CHLORIDE 0.9 % IJ SOLN
INTRAMUSCULAR | Status: AC
  Filled 2015-04-16: qty 10

## 2015-04-16 MED ORDER — LEVOTHYROXINE SODIUM 100 MCG PO TABS
200.0000 ug | ORAL_TABLET | Freq: Every day | ORAL | Status: DC
  Administered 2015-04-17 – 2015-04-18 (×2): 200 ug via ORAL
  Filled 2015-04-16 (×2): qty 2

## 2015-04-16 MED ORDER — NEOSTIGMINE METHYLSULFATE 10 MG/10ML IV SOLN
INTRAVENOUS | Status: DC | PRN
  Administered 2015-04-16: 5 mg via INTRAVENOUS

## 2015-04-16 MED ORDER — GLYCOPYRROLATE 0.2 MG/ML IJ SOLN
INTRAMUSCULAR | Status: AC
  Filled 2015-04-16: qty 3

## 2015-04-16 MED ORDER — ASPIRIN EC 81 MG PO TBEC
81.0000 mg | DELAYED_RELEASE_TABLET | Freq: Two times a day (BID) | ORAL | Status: DC
  Administered 2015-04-16: 81 mg via ORAL
  Filled 2015-04-16 (×4): qty 1

## 2015-04-16 MED ORDER — B COMPLEX-C PO TABS
1.0000 | ORAL_TABLET | Freq: Every day | ORAL | Status: DC
  Administered 2015-04-17 – 2015-04-18 (×2): 1 via ORAL
  Filled 2015-04-16 (×2): qty 1

## 2015-04-16 MED ORDER — MENTHOL 3 MG MT LOZG: 1.0000 | LOZENGE | OROMUCOSAL | Status: DC | PRN

## 2015-04-16 MED ORDER — HYDROMORPHONE HCL 1 MG/ML IJ SOLN
0.2500 mg | INTRAMUSCULAR | Status: DC | PRN
  Administered 2015-04-16 (×4): 0.5 mg via INTRAVENOUS

## 2015-04-16 MED ORDER — ACETAMINOPHEN 10 MG/ML IV SOLN
1000.0000 mg | Freq: Once | INTRAVENOUS | Status: DC
  Administered 2015-04-16: 1000 mg via INTRAVENOUS
  Filled 2015-04-16: qty 100

## 2015-04-16 MED ORDER — SODIUM CHLORIDE 0.9 % IJ SOLN: 3.0000 mL | INTRAMUSCULAR | Status: DC | PRN

## 2015-04-16 MED ORDER — HYDROCODONE-ACETAMINOPHEN 5-325 MG PO TABS
1.0000 | ORAL_TABLET | ORAL | Status: DC | PRN
Start: 2015-04-16 — End: 2015-04-18
  Administered 2015-04-16: 2 via ORAL
  Filled 2015-04-16 (×2): qty 2

## 2015-04-16 MED ORDER — LABETALOL HCL 5 MG/ML IV SOLN
5.0000 mg | INTRAVENOUS | Status: AC | PRN
  Administered 2015-04-16 (×4): 5 mg via INTRAVENOUS

## 2015-04-16 MED ORDER — THROMBIN 5000 UNITS EX SOLR
CUTANEOUS | Status: DC | PRN
  Administered 2015-04-16 (×2): 5000 [IU] via TOPICAL

## 2015-04-16 MED ORDER — SODIUM CHLORIDE 0.9 % IV SOLN: 250.0000 mL | INTRAVENOUS | Status: DC

## 2015-04-16 MED ORDER — B COMPLEX-C PO TABS
1.0000 | ORAL_TABLET | Freq: Every day | ORAL | Status: DC
  Administered 2015-04-16: 1 via ORAL
  Filled 2015-04-16 (×2): qty 1

## 2015-04-16 MED ORDER — MIDAZOLAM HCL 2 MG/2ML IJ SOLN
INTRAMUSCULAR | Status: AC
  Filled 2015-04-16: qty 2

## 2015-04-16 MED ORDER — VECURONIUM BROMIDE 10 MG IV SOLR
INTRAVENOUS | Status: AC
  Filled 2015-04-16: qty 10

## 2015-04-16 MED ORDER — ISOSORB DINITRATE-HYDRALAZINE 20-37.5 MG PO TABS
1.0000 | ORAL_TABLET | Freq: Two times a day (BID) | ORAL | Status: DC
  Administered 2015-04-16 – 2015-04-18 (×4): 1 via ORAL
  Filled 2015-04-16 (×5): qty 1

## 2015-04-16 MED ORDER — ACETAMINOPHEN 650 MG RE SUPP: 650.0000 mg | RECTAL | Status: DC | PRN

## 2015-04-16 MED ORDER — OXYCODONE-ACETAMINOPHEN 5-325 MG PO TABS
1.0000 | ORAL_TABLET | ORAL | Status: DC | PRN
  Administered 2015-04-16 (×2): 2 via ORAL
  Administered 2015-04-17: 1 via ORAL
  Administered 2015-04-17: 2 via ORAL
  Administered 2015-04-17: 1 via ORAL
  Administered 2015-04-17 – 2015-04-18 (×2): 2 via ORAL
  Filled 2015-04-16: qty 2
  Filled 2015-04-16: qty 1
  Filled 2015-04-16 (×4): qty 2

## 2015-04-16 MED ORDER — ALBUTEROL SULFATE (2.5 MG/3ML) 0.083% IN NEBU
2.5000 mg | INHALATION_SOLUTION | Freq: Four times a day (QID) | RESPIRATORY_TRACT | Status: DC | PRN
  Administered 2015-04-17 – 2015-04-18 (×4): 2.5 mg via RESPIRATORY_TRACT
  Filled 2015-04-16 (×4): qty 3

## 2015-04-16 MED ORDER — LABETALOL HCL 5 MG/ML IV SOLN
INTRAVENOUS | Status: AC
  Filled 2015-04-16: qty 4

## 2015-04-16 MED ORDER — MAGNESIUM CITRATE PO SOLN: 1.0000 | Freq: Once | ORAL | Status: AC | PRN

## 2015-04-16 MED ORDER — LACTATED RINGERS IV SOLN
INTRAVENOUS | Status: DC
  Administered 2015-04-16 (×2): via INTRAVENOUS

## 2015-04-16 MED ORDER — POTASSIUM CHLORIDE IN NACL 20-0.9 MEQ/L-% IV SOLN: INTRAVENOUS | Status: DC

## 2015-04-16 MED ORDER — ONDANSETRON HCL 4 MG/2ML IJ SOLN: 4.0000 mg | INTRAMUSCULAR | Status: DC | PRN

## 2015-04-16 MED ORDER — EPHEDRINE SULFATE 50 MG/ML IJ SOLN
INTRAMUSCULAR | Status: AC
  Filled 2015-04-16: qty 1

## 2015-04-16 MED ORDER — ALBUTEROL SULFATE HFA 108 (90 BASE) MCG/ACT IN AERS: 2.0000 | INHALATION_SPRAY | Freq: Four times a day (QID) | RESPIRATORY_TRACT | Status: DC | PRN

## 2015-04-16 SURGICAL SUPPLY — 70 items
BIT DRILL NEURO 2X3.1 SFT TUCH (MISCELLANEOUS) ×1 IMPLANT
BLADE CLIPPER SURG (BLADE) IMPLANT
BNDG GAUZE ELAST 4 BULKY (GAUZE/BANDAGES/DRESSINGS) ×6 IMPLANT
BUR DRUM 4.0 (BURR) IMPLANT
BUR DRUM 4.0MM (BURR)
CANISTER SUCT 3000ML PPV (MISCELLANEOUS) ×3 IMPLANT
CONT SPEC 4OZ CLIKSEAL STRL BL (MISCELLANEOUS) ×3 IMPLANT
DECANTER SPIKE VIAL GLASS SM (MISCELLANEOUS) IMPLANT
DRAPE LAPAROTOMY 100X72 PEDS (DRAPES) ×3 IMPLANT
DRAPE MICROSCOPE LEICA (MISCELLANEOUS) ×3 IMPLANT
DRAPE POUCH INSTRU U-SHP 10X18 (DRAPES) ×3 IMPLANT
DRAPE PROXIMA HALF (DRAPES) ×3 IMPLANT
DRILL NEURO 2X3.1 SOFT TOUCH (MISCELLANEOUS) ×3
DURAPREP 6ML APPLICATOR 50/CS (WOUND CARE) ×3 IMPLANT
ELECT COATED BLADE 2.86 ST (ELECTRODE) ×3 IMPLANT
ELECT REM PT RETURN 9FT ADLT (ELECTROSURGICAL) ×3
ELECTRODE REM PT RTRN 9FT ADLT (ELECTROSURGICAL) ×1 IMPLANT
GAUZE SPONGE 4X4 16PLY XRAY LF (GAUZE/BANDAGES/DRESSINGS) IMPLANT
GLOVE BIO SURGEON STRL SZ 6.5 (GLOVE) IMPLANT
GLOVE BIO SURGEON STRL SZ7 (GLOVE) IMPLANT
GLOVE BIO SURGEON STRL SZ7.5 (GLOVE) IMPLANT
GLOVE BIO SURGEON STRL SZ8 (GLOVE) IMPLANT
GLOVE BIO SURGEON STRL SZ8.5 (GLOVE) IMPLANT
GLOVE BIO SURGEONS STRL SZ 6.5 (GLOVE)
GLOVE BIOGEL M 8.0 STRL (GLOVE) IMPLANT
GLOVE ECLIPSE 6.5 STRL STRAW (GLOVE) ×6 IMPLANT
GLOVE ECLIPSE 7.0 STRL STRAW (GLOVE) IMPLANT
GLOVE ECLIPSE 7.5 STRL STRAW (GLOVE) IMPLANT
GLOVE ECLIPSE 8.0 STRL XLNG CF (GLOVE) IMPLANT
GLOVE ECLIPSE 8.5 STRL (GLOVE) ×3 IMPLANT
GLOVE EXAM NITRILE LRG STRL (GLOVE) IMPLANT
GLOVE EXAM NITRILE MD LF STRL (GLOVE) IMPLANT
GLOVE EXAM NITRILE XL STR (GLOVE) IMPLANT
GLOVE EXAM NITRILE XS STR PU (GLOVE) IMPLANT
GLOVE INDICATOR 6.5 STRL GRN (GLOVE) IMPLANT
GLOVE INDICATOR 7.0 STRL GRN (GLOVE) IMPLANT
GLOVE INDICATOR 7.5 STRL GRN (GLOVE) IMPLANT
GLOVE INDICATOR 8.0 STRL GRN (GLOVE) IMPLANT
GLOVE INDICATOR 8.5 STRL (GLOVE) IMPLANT
GLOVE OPTIFIT SS 8.0 STRL (GLOVE) IMPLANT
GLOVE SURG SS PI 6.5 STRL IVOR (GLOVE) IMPLANT
GOWN STRL REUS W/ TWL LRG LVL3 (GOWN DISPOSABLE) ×1 IMPLANT
GOWN STRL REUS W/ TWL XL LVL3 (GOWN DISPOSABLE) ×2 IMPLANT
GOWN STRL REUS W/TWL 2XL LVL3 (GOWN DISPOSABLE) ×3 IMPLANT
GOWN STRL REUS W/TWL LRG LVL3 (GOWN DISPOSABLE) ×2
GOWN STRL REUS W/TWL XL LVL3 (GOWN DISPOSABLE) ×4
IMPL ZERO-P LORDOTIC 10MM (Neuro Prosthesis/Implant) ×1 IMPLANT
IMPLANT ZERO-P LORDOTIC 10MM (Neuro Prosthesis/Implant) ×3 IMPLANT
KIT BASIN OR (CUSTOM PROCEDURE TRAY) ×3 IMPLANT
KIT ROOM TURNOVER OR (KITS) ×3 IMPLANT
LIQUID BAND (GAUZE/BANDAGES/DRESSINGS) ×3 IMPLANT
NEEDLE HYPO 25X1 1.5 SAFETY (NEEDLE) ×3 IMPLANT
NEEDLE SPNL 22GX3.5 QUINCKE BK (NEEDLE) ×3 IMPLANT
NS IRRIG 1000ML POUR BTL (IV SOLUTION) ×3 IMPLANT
PACK LAMINECTOMY NEURO (CUSTOM PROCEDURE TRAY) ×3 IMPLANT
PAD ARMBOARD 7.5X6 YLW CONV (MISCELLANEOUS) ×9 IMPLANT
PIN DISTRACTION 14MM (PIN) ×6 IMPLANT
PUTTY BONE DBX 2.5 MIS (Bone Implant) ×3 IMPLANT
RUBBERBAND STERILE (MISCELLANEOUS) ×6 IMPLANT
SCREW SELF DRILLING 14MM (Screw) ×3 IMPLANT
SPONGE INTESTINAL PEANUT (DISPOSABLE) ×6 IMPLANT
SPONGE SURGIFOAM ABS GEL SZ50 (HEMOSTASIS) ×3 IMPLANT
SUT VIC AB 0 CT1 27 (SUTURE) ×2
SUT VIC AB 0 CT1 27XBRD ANTBC (SUTURE) ×1 IMPLANT
SUT VIC AB 3-0 SH 8-18 (SUTURE) ×6 IMPLANT
SYR 20ML ECCENTRIC (SYRINGE) ×3 IMPLANT
TOWEL OR 17X24 6PK STRL BLUE (TOWEL DISPOSABLE) ×3 IMPLANT
TOWEL OR 17X26 10 PK STRL BLUE (TOWEL DISPOSABLE) ×3 IMPLANT
WATER STERILE IRR 1000ML POUR (IV SOLUTION) ×3 IMPLANT
ZERO PVA ST 14MM SCREW (Orthopedic Implant) ×3 IMPLANT

## 2015-04-16 NOTE — Progress Notes (Signed)
Patient arrived to 1N23. A&Ox4, drowsy but easily arousable. Incision clean dry, and intact. Aspen collar on and aligned. Complaining of left shoulder pain, per patient and PACU RN, this is patient's baseline. Patient oriented to room, staff, unit. Call bell within patient's reach. Will continue to monitor closely.

## 2015-04-16 NOTE — H&P (Signed)
BP 139/88 mmHg  Pulse 81  Temp(Src) 98.6 F (37 C) (Oral)  Ht 6\' 2"  (1.88 m)  Wt 102.059 kg (225 lb)  BMI 28.88 kg/m2  SpO2 100%  Peter [name] comes in today for week-long history of severe pain in his neck and right shoulder.  He says he took a drive to [place], he went to a prayer meeting and drove back and was having no problems; however, that night, into the next morning on Monday he started developing very severe pain.  Due to this he doubled his dose of Percocet, taking two pills every six hours.  He also was taking two 500 mg Tylenol tablets.  He has done this now for the last week.  He also placed himself back in the cervical collar because he says he could barely sleep without it and it helps him to hold his head up.               DATA:                                                  I did have x-rays performed this morning and there is absolutely no change.  He appears to have a solid fusion where he underwent his corpectomy.            PHYSICAL EXAMINATION:                    On physical exam, he has weak hands and grips, but he has had this now ever since I have known him.  His biceps, triceps, deltoids are actually quite good.  He remains hyperreflexic.  No pain in the lower extremities and full strength.  He is continuing to walk with his cane, which again, is not new and he has done so for quite some time.  His speaking voice is normal.  There is no erythema or abnormality in the neck that I can determine from a cursory exam.  He has no pain with passive movement of the right shoulder, which is where he says the severe pain will radiate.  The pain does not go beyond the right shoulder.            Prior to Admission medications   Medication Sig Start Date End Date Taking? Authorizing Provider  acetaminophen (TYLENOL) 500 MG tablet Take 500 mg by mouth every 6 (six) hours as needed (usually takes 2 at least once a day).   Yes Historical Provider, MD  albuterol (PROVENTIL  HFA;VENTOLIN HFA) 108 (90 BASE) MCG/ACT inhaler Inhale 2 puffs into the lungs every 6 (six) hours as needed for wheezing.   Yes Historical Provider, MD  aspirin EC 81 MG tablet Take 81 mg by mouth 2 (two) times daily.   Yes Historical Provider, MD  carvedilol (COREG) 12.5 MG tablet Take 12.5 mg by mouth 2 (two) times daily with a meal.   Yes Historical Provider, MD  docusate sodium (COLACE) 100 MG capsule Take 100 mg by mouth 2 (two) times daily.   Yes Historical Provider, MD  furosemide (LASIX) 20 MG tablet Take 1 tablet (20 mg total) by mouth daily. Patient taking differently: Take 20 mg by mouth daily. Instructed to take as needed 08/25/13  Yes Shon Baton, MD  isosorbide-hydrALAZINE (BIDIL) 20-37.5 MG per tablet Take 1 tablet by mouth 2 (  two) times daily. 08/14/13  Yes Shon Baton, MD  levothyroxine (SYNTHROID, LEVOTHROID) 200 MCG tablet Take 200 mcg by mouth daily before breakfast.   Yes Historical Provider, MD  oxyCODONE-acetaminophen (PERCOCET) 5-325 MG per tablet 1-2 tabs po q6 hours prn pain 03/23/14  Yes Leanora Cover, MD  b complex vitamins tablet Take 1 tablet by mouth daily.    Historical Provider, MD  Multiple Vitamin (MULTIVITAMIN WITH MINERALS) TABS Take 1 tablet by mouth daily.    Historical Provider, MD   Past Surgical History  Procedure Laterality Date  . Cardiac catheterization  2014  . Anterior cervical decomp/discectomy fusion N/A 07/30/2013    Procedure: CERVICAL FIVE,CERVICAL SIX CORPECTOMY,ANTERIOR ARTHRODESIS,PEEK INTERBODY GRAFT CERVICAL FOUR-CERVICAL SEVEN,ANTERIOR INSTRUMENTATION;  Surgeon: Winfield Cunas, MD;  Location: [place] [facility];  Service: Neurosurgery;  Laterality: N/A;  . Lumbar wound debridement N/A 11/07/2013    Procedure: LUMBAR WOUND DEBRIDEMENT;  Surgeon: Winfield Cunas, MD;  Location: [place] [facility];  Service: Neurosurgery;  Laterality: N/A;  . Tonsillectomy  1958  . Back surgery  1/15    lumb fusion  . Colonoscopy  2003  . Carpal tunnel release Right 03/23/2014     Procedure: RIGHT CARPAL TUNNEL RELEASE;  Surgeon: Tennis Must, MD;  Location: [facility];  Service: Orthopedics;  Laterality: Right;  . Left and right heart catheterization with coronary angiogram N/A 03/11/2013    Procedure: LEFT AND RIGHT HEART CATHETERIZATION WITH CORONARY ANGIOGRAM;  Surgeon: Laverda Page, MD;  [facility];  Service: Cardiovascular;  Laterality: N/A;   Past Medical History  Diagnosis Date  . Allergy   . Asthma   . Hypertension   . Substance abuse     not in many years  . Acute combined systolic and diastolic heart failure 10/13/1476  . Shortness of breath     with asthma  . Cardiomyopathy 2014    "viral"  . Complication of anesthesia     problems urinating after anesthesia  . Wears glasses   . Wears partial dentures     top  . Back pain   . MRSA (methicillin resistant Staphylococcus aureus) infection     lumbar-2015  . Fatigue due to treatment   . Hypersomnia due to drug   . Obstructive sleep apnea syndrome 09/22/2014    uses cpap  . Hypothyroidism     had Graves' disease - thyroid ablation  . Arthritis     right knee   Family History  Problem Relation Age of Onset  . Colon cancer Neg Hx    History   Social History  . Marital Status: Married    Spouse Name: Rise Paganini  . Number of Children: 4  . Years of Education: College   Occupational History  .     Social History Main Topics  . Smoking status: Former Smoker -- 20 years    Quit date: 08/14/1995  . Smokeless tobacco: Never Used  . Alcohol Use: No  . Drug Use: No  . Sexual Activity: Yes   Other Topics Concern  . 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   Patient has four adult children.   Patient is disabled.   Patient has college education.   Patient is right-handed.   Patient drinks very little caffeine.   Mr. Brennen will be admitted for an ACDF. Risks and benefits have been  explained. He understands and wishes to  proceed.

## 2015-04-16 NOTE — Op Note (Signed)
04/16/2015  5:32 PM  PATIENT:  Peter [name]  64 y.o. male presented with severe pain in right shoulder. MRI revealed a large herniated disc at C3/4 above his prior fusion, eccentric to the right side and I believe the etiology of his pain. He would like to undergo operative decompression and subsequent arthrodesis  PRE-OPERATIVE DIAGNOSIS:  cervical herniated disc C3/4 POST-OPERATIVE DIAGNOSIS:  cervical herniated disc C3/4 PROCEDURE:  Anterior Cervical decompression C3/4  Arthrodesis C3/4 with 7mm peek implant (synthes 0p), allograft morselized Anterior instrumentation(Synthes 0p) C3/4  SURGEON:   Surgeon(s): Ashok Pall, MD Karie Chimera, MD   ASSISTANTS:Kritzer, Louie Casa  ANESTHESIA:   general  EBL:  Total I/O In: 1800 [I.V.:1800] Out: 100 [Blood:100]  BLOOD ADMINISTERED:none  CELL SAVER GIVEN:none  COUNT:per nursing  DRAINS: none   SPECIMEN:  No Specimen  DICTATION: Mr. Miklas was taken to the operating room, intubated, and placed under general anesthesia without difficulty. He was positioned supine with his head in slight extension on a horseshoe headrest. The neck was prepped and draped in a sterile manner. I infiltrated 4 cc's 1/2%lidocaine/1:200,000 strength epinephrine into the planned incision starting from the midline to the medial border of the left sternocleidomastoid muscle. I opened the incision with a 10 blade and dissected sharply through soft tissue to the platysma. I dissected in the plane superior to the platysma both rostrally and caudally. I then opened the platysma in a horizontal fashion with Metzenbaum scissors, and dissected in the inferior plane rostrally and caudally. With both blunt and sharp technique I created an avascular corridor to the cervical spine.I dissected the plate and identified the rostral end. I then started my discetomy of C3/4 . I then reflected the longus colli from C3 to C4 and placed self retaining retractors. I opened the  disc space(s) at 3/4 with a 15 blade. I removed disc with curettes, Kerrison punches, and the drill. Using the drill I removed osteophytes and prepared for the decompression.  I decompressed the spinal canal and the C4 root(s) with the drill, Kerrison punches, and the curettes. I used the microscope to aid in microdissection. I removed the posterior longitudinal ligament to fully expose and decompress the thecal sac. I exposed the roots laterally taking down the 3/4 uncovertebral joints. With the decompression complete I moved on to the arthrodesis. I used the drill to level the surfaces of C3, and 4. I removed soft tissue to prepare the disc space and the bony surfaces. I measured the space and placed a 9mm Zero P peek interbody into the disc space.  I then placed the screws through the integrated plate. I placed 1 screws in each vertebral body through the plate. I locked the screws into place. Intraoperative xray showed the graft, plate, and screws to be in good position. I irrigated the wound, achieved hemostasis, and closed the wound in layers. I approximated the platysma, and the subcuticular plane with vicryl sutures. I used Dermabond for a sterile dressing.   PLAN OF CARE: Admit to inpatient   PATIENT DISPOSITION:  PACU - hemodynamically stable.   Delay start of Pharmacological VTE agent (>24hrs) due to surgical blood loss or risk of bleeding:  yes

## 2015-04-16 NOTE — Anesthesia Procedure Notes (Signed)
Procedure Name: Intubation Date/Time: 04/16/2015 2:46 PM Performed by: Jenne Campus Pre-anesthesia Checklist: Patient identified, Emergency Drugs available, Suction available, Patient being monitored and Timeout performed Patient Re-evaluated:Patient Re-evaluated prior to inductionOxygen Delivery Method: Circle system utilized Preoxygenation: Pre-oxygenation with 100% oxygen Intubation Type: IV induction Grade View: Grade I Tube type: Oral Tube size: 8.0 mm Number of attempts: 1 Airway Equipment and Method: Stylet and Video-laryngoscopy Placement Confirmation: positive ETCO2,  CO2 detector and breath sounds checked- equal and bilateral Secured at: 22 cm Tube secured with: Tape Dental Injury: Teeth and Oropharynx as per pre-operative assessment  Difficulty Due To: Difficulty was anticipated, Difficult Airway- due to reduced neck mobility, Difficult Airway- due to dentition and Difficult Airway- due to large tongue Future Recommendations: Recommend- induction with short-acting agent, and alternative techniques readily available

## 2015-04-16 NOTE — Transfer of Care (Signed)
Immediate Anesthesia Transfer of Care Note  Patient: Peter [name]  Procedure(s) Performed: Procedure(s) with comments: CERVICAL THREE-FOUR ANTERIOR CERVICAL DECOMPRESSION/DISCECTOMY FUSION 1 LEVEL (N/A) - C34 anterior cervical decompression with fusion plating and bonegraft  Patient Location: PACU  Anesthesia Type:General  Level of Consciousness: awake and patient cooperative  Airway & Oxygen Therapy: Patient Spontanous Breathing and Patient connected to nasal cannula oxygen  Post-op Assessment: Report given to RN and Patient moving all extremities X 4  Post vital signs: Reviewed and stable  Last Vitals:  Filed Vitals:   04/16/15 0652  BP: 139/88  Pulse: 81  Temp: 37 C    Complications: No apparent anesthesia complications

## 2015-04-16 NOTE — Anesthesia Preprocedure Evaluation (Addendum)
Anesthesia Evaluation  Patient identified by MRN, date of birth, ID band Patient awake    Reviewed: Allergy & Precautions, NPO status , Patient's Chart, lab work & pertinent test results, reviewed documented beta blocker date and time   Airway Mallampati: II  TM Distance: >3 FB Neck ROM: Limited    Dental  (+) Partial Upper, Partial Lower, Poor Dentition, Missing, Chipped, Dental Advisory Given   Pulmonary shortness of breath and at rest, asthma , sleep apnea and Continuous Positive Airway Pressure Ventilation , former smoker,  breath sounds clear to auscultation        Cardiovascular hypertension, Pt. on home beta blockers + DOE Rhythm:Regular Rate:Normal     Neuro/Psych    GI/Hepatic negative GI ROS, Neg liver ROS,   Endo/Other  Hypothyroidism   Renal/GU Renal disease     Musculoskeletal  (+) Arthritis -,   Abdominal   Peds  Hematology   Anesthesia Other Findings   Reproductive/Obstetrics                          Anesthesia Physical Anesthesia Plan  ASA: III  Anesthesia Plan: General   Post-op Pain Management:    Induction: Intravenous  Airway Management Planned: Oral ETT and Video Laryngoscope Planned  Additional Equipment:   Intra-op Plan:   Post-operative Plan: Possible Post-op intubation/ventilation  Informed Consent: I have reviewed the patients History and Physical, chart, labs and discussed the procedure including the risks, benefits and alternatives for the proposed anesthesia with the patient or authorized representative who has indicated his/her understanding and acceptance.   Dental advisory given  Plan Discussed with: CRNA and Anesthesiologist  Anesthesia Plan Comments:        Anesthesia Quick Evaluation

## 2015-04-16 NOTE — Progress Notes (Signed)
Patient's vitals stable, oxygen sats 100% on room air.  Patient alert and talking but at times seems to become short of breath.  Patient states that he has been this way for a long time and that this is his normal.

## 2015-04-16 NOTE — Anesthesia Postprocedure Evaluation (Signed)
Anesthesia Post Note  Patient: Peter [name]  Procedure(s) Performed: Procedure(s) (LRB): CERVICAL THREE-FOUR ANTERIOR CERVICAL DECOMPRESSION/DISCECTOMY FUSION 1 LEVEL (N/A)  Anesthesia type: general  Patient location: PACU  Post pain: Pain level controlled  Post assessment: Patient's Cardiovascular Status Stable  Last Vitals:  Filed Vitals:   04/16/15 1835  BP: 165/106  Pulse: 72  Temp:   Resp: 11    Post vital signs: Reviewed and stable  Level of consciousness: sedated  Complications: No apparent anesthesia complications

## 2015-04-17 NOTE — Evaluation (Signed)
Physical Therapy Evaluation Patient Details Name: Peter [name] MRN: [id number] DOB: 1950-09-29 Today's Date: 04/17/2015   History of Present Illness  Patient is a 64 yo male admitted 04/16/15 with severe pain in neck and Rt shoulder.  Pt s/p C3-4 ACDF due to HNP.  PMH:  cervical fusion 2014, lumbar fusion 2015, HTN, OSA using CPAP, arthritis, asthma  Clinical Impression  Patient is functioning at Modified Independent to Independent level with mobility and gait.  Will have appropriate assist at home.  All education complete.  No further acute PT needs identified - PT will sign off.  Encouraged ambulation in hallway with nursing.    Follow Up Recommendations No PT follow up;Supervision for mobility/OOB    Equipment Recommendations  None recommended by PT    Recommendations for Other Services       Precautions / Restrictions Precautions Precautions: Cervical Precaution Comments: Reviewed cervical precautions Required Braces or Orthoses: Cervical Brace;Other Brace/Splint Cervical Brace: Hard collar;At all times Other Brace/Splint: Wears AFO on RLE due to foot drop Restrictions Weight Bearing Restrictions: No      Mobility  Bed Mobility Overal bed mobility: Modified Independent             General bed mobility comments: Patient using correct technique.  Increased time  Transfers Overall transfer level: Independent Equipment used: None                Ambulation/Gait Ambulation/Gait assistance: Modified independent (Device/Increase time) Ambulation Distance (Feet): 450 Feet Assistive device: None Gait Pattern/deviations: Step-through pattern;Decreased stride length Gait velocity: Decreased Gait velocity interpretation: Below normal speed for age/gender General Gait Details: Patient with good gait pattern and balance.  Good activity tolerance.  Stairs Stairs: Yes Stairs assistance: Modified independent (Device/Increase time) Stair Management: One rail  Right;Step to pattern;Forwards Number of Stairs: 5 General stair comments: Encouraged step-to pattern for safety.  Wheelchair Mobility    Modified Rankin (Stroke Patients Only)       Balance Overall balance assessment: Independent                                           Pertinent Vitals/Pain Pain Assessment: 0-10 Pain Score: 3  Pain Location: Incision site Pain Descriptors / Indicators: Sore Pain Intervention(s): Monitored during session    Home Living Family/patient expects to be discharged to:: Private residence Living Arrangements: Spouse/significant other Available Help at Discharge: Family;Available 24 hours/day (wife and adult children) Type of Home: House Home Access: Stairs to enter Entrance Stairs-Rails: None Entrance Stairs-Number of Steps: 1 Home Layout: One level Home Equipment: Walker - 2 wheels;Shower seat;Grab bars - tub/shower;Bedside commode;Cane - single point      Prior Function Level of Independence: Independent         Comments: Patient and wife care for grandson 5 days/week     Hand Dominance   Dominant Hand: Right    Extremity/Trunk Assessment   Upper Extremity Assessment: Defer to OT evaluation           Lower Extremity Assessment: Overall WFL for tasks assessed;RLE deficits/detail RLE Deficits / Details: Foot drop/decreased DF strength - longstanding.  Wears AFO       Communication   Communication: No difficulties  Cognition Arousal/Alertness: Awake/alert Behavior During Therapy: WFL for tasks assessed/performed Overall Cognitive Status: Within Functional Limits for tasks assessed  General Comments      Exercises        Assessment/Plan    PT Assessment Patent does not need any further PT services  PT Diagnosis Generalized weakness;Acute pain   PT Problem List    PT Treatment Interventions     PT Goals (Current goals can be found in the Care Plan section) Acute  Rehab PT Goals Patient Stated Goal: go home PT Goal Formulation: All assessment and education complete, DC therapy    Frequency     Barriers to discharge        Co-evaluation               End of Session Equipment Utilized During Treatment: Cervical collar Activity Tolerance: Patient tolerated treatment well Patient left: in bed;with call bell/phone within reach Nurse Communication: Mobility status (Encouraged ambulation in hallway with supervision)         Time: 1751-0258 PT Time Calculation (min) (ACUTE ONLY): 37 min   Charges:   PT Evaluation $Initial PT Evaluation Tier I: 1 Procedure PT Treatments $Gait Training: 8-22 mins   PT G CodesDespina [name] 05/11/2015, 7:20 PM Peter [name]. Peter [name], [place] Pager [phone]

## 2015-04-17 NOTE — Evaluation (Addendum)
Occupational Therapy Evaluation Patient Details Name: Peter [name] MRN: [id number] DOB: 12-05-1950 Today's Date: 04/17/2015    History of Present Illness Anterior Cervical decompression C3/4    Clinical Impression   Pt is Mod I with ADLs and ADL mobility. All education completed and no further acute OT is indicated at this time    Follow Up Recommendations  No OT follow up    Equipment Recommendations  None recommended by OT    Recommendations for Other Services       Precautions / Restrictions Precautions Precautions: Cervical Precaution Comments: cervical precaution education and handout provided Required Braces or Orthoses: Cervical Brace Cervical Brace: Hard collar      Mobility Bed Mobility Overal bed mobility: Modified Independent             General bed mobility comments: using log roll technique without cues  Transfers Overall transfer level: Independent Equipment used: None             General transfer comment: pt up ambulating around in room and going to restroom    Balance Overall balance assessment: No apparent balance deficits (not formally assessed)                                          ADL Overall ADL's : Modified independent                                       General ADL Comments: pt up ambulating around in room , able to make up bed     Vision  wears glasses, no change in baseline   Perception Perception Perception Tested?: No   Praxis Praxis Praxis tested?: Not tested    Pertinent Vitals/Pain Pain Assessment: 0-10 Pain Score: 2  Pain Location: cervical area Pain Descriptors / Indicators: Sore;Aching Pain Intervention(s): Monitored during session     Hand Dominance Right   Extremity/Trunk Assessment Upper Extremity Assessment Upper Extremity Assessment: Generalized weakness;RUE deficits/detail RUE Deficits / Details: R grip and shoulder strength 3+/5 grossly   Lower  Extremity Assessment Lower Extremity Assessment: Defer to PT evaluation   Cervical / Trunk Assessment Cervical / Trunk Assessment: Normal   Communication Communication Communication: No difficulties   Cognition Arousal/Alertness: Awake/alert Behavior During Therapy: WFL for tasks assessed/performed Overall Cognitive Status: Within Functional Limits for tasks assessed                     General Comments   pt pleasant, cooperative and talkative                 Home Living Family/patient expects to be discharged to:: Private residence Living Arrangements: Spouse/significant other Available Help at Discharge: Family Type of Home: House Home Access: Stairs to enter     Home Layout: One level     Bathroom Shower/Tub: Teacher, early years/pre: Standard     Home Equipment: Environmental consultant - 2 wheels;Shower seat;Grab bars - tub/shower;Bedside commode          Prior Functioning/Environment Level of Independence: Independent             OT Diagnosis: Acute pain   OT Problem List: Pain   OT Treatment/Interventions:      OT Goals(Current goals can be found in the care plan section) Acute Rehab  OT Goals Patient Stated Goal: go home OT Goal Formulation: With patient Time For Goal Achievement: 04/24/15                                 End of Session Equipment Utilized During Treatment: Cervical collar  Activity Tolerance: Patient tolerated treatment well Patient left: in bed (sitting EOB)   Time: 11:08-11:42 OT Time Calculation (min): 34 min Charges:  OT General Charges $OT Visit: 1 Procedure OT Evaluation $Initial OT Evaluation Tier I: 1 Procedure OT Treatments $Therapeutic Activity: 8-22 mins  $Selfcare 8-22 minutes G-Codes:    Britt Bottom 04/17/2015, 1:11 PM

## 2015-04-17 NOTE — Progress Notes (Signed)
Utilization Review completed. Malikai Gut RN BSN CM 

## 2015-04-17 NOTE — Progress Notes (Signed)
Patient ID: Peter [name], male   DOB: 1951/09/03, 64 y.o.   MRN: [id number] BP 127/110 mmHg  Pulse 99  Temp(Src) 99 F (37.2 C) (Oral)  Resp 18  Ht 6\' 2"  (1.88 m)  Wt 102.059 kg (225 lb)  BMI 28.88 kg/m2  SpO2 99% Alert and oriented x 4, speech is clear,and fluent Doing well with his wAlker Wound is clean,dry, no signs of infection Continue pt,ot Voice is strong

## 2015-04-18 MED ORDER — ALBUTEROL SULFATE HFA 108 (90 BASE) MCG/ACT IN AERS: 2.0000 | INHALATION_SPRAY | Freq: Four times a day (QID) | RESPIRATORY_TRACT | Status: DC | PRN

## 2015-04-18 MED ORDER — ALBUTEROL SULFATE (2.5 MG/3ML) 0.083% IN NEBU: 2.5000 mg | INHALATION_SOLUTION | Freq: Four times a day (QID) | RESPIRATORY_TRACT | Status: DC | PRN

## 2015-04-18 MED ORDER — OXYCODONE HCL 5 MG PO TABS: 5.0000 mg | ORAL_TABLET | ORAL | Status: AC | PRN

## 2015-04-18 NOTE — Progress Notes (Signed)
DC instructions and prescription given to patient. All questions answered. Patient in [facility] escorted to lobby where daughter is to transport home in private vehicle.

## 2015-04-18 NOTE — Care Management Note (Signed)
Case Management Note  Patient Details  Name: Yoshio Seliga MRN: [id number] Date of Birth: [birth year]  Subjective/Objective:                  Anterior Cervical decompression C3/4   Action/Plan: CM spoke to pt at the bedside who states that he is being discharged home today with daughter and grandchild and then tomorrow and the days following, his spouse will be at home who is also a Neurosurgeon. Pt states that he needs no additional DME at home and already has a cane, RW, 3N1, shower chair and leg brace. Pt denies further needs or concerns, no additional CM needs communicated.   Expected Discharge Date:  04/18/15             Expected Discharge Plan:  Home/Self Care  In-House Referral:     Discharge planning Services  CM Consult  Post Acute Care Choice:    Choice offered to:     DME Arranged:    DME Agency:     HH Arranged:    HH Agency:     Status of Service:  Completed, signed off  Medicare Important Message Given:    Date Medicare IM Given:    Medicare IM give by:    Date Additional Medicare IM Given:    Additional Medicare Important Message give by:     If discussed at [place] of Stay Meetings, dates discussed:    Additional Comments:  Guido Sander, RN 04/18/2015, 10:48 AM

## 2015-04-18 NOTE — Discharge Summary (Signed)
  Physician Discharge Summary  Patient ID: Peter [name] MRN: [id number] DOB/AGE: 10/26/1950 64 y.o.  Admit date: 04/16/2015 Discharge date: 04/18/2015  Admission Diagnoses:  Discharge Diagnoses:  Active Problems:   HNP (herniated nucleus pulposus), cervical   Discharged Condition: good  Hospital Course: Surgery 2 days ago for C 34 acdf. Did well. No new neuro issues. By pod 2, ambulating well. Wouind clean and dry. Home with specific instructions.  Consults: None  Significant Diagnostic Studies: none  Treatments: surgery: C 34 acdf  Discharge Exam: Blood pressure 149/102, pulse 65, temperature 98 F (36.7 C), temperature source Oral, resp. rate 16, height 6\' 2"  (1.88 m), weight 102.059 kg (225 lb), SpO2 98 %. Incision/Wound:clean and dry; no new neuro issues  Disposition: 01-Home or Self Care     Medication List    ASK your doctor about these medications        acetaminophen 500 MG tablet  Commonly known as:  TYLENOL  Take 500 mg by mouth every 6 (six) hours as needed (usually takes 2 at least once a day).     albuterol 108 (90 BASE) MCG/ACT inhaler  Commonly known as:  PROVENTIL HFA;VENTOLIN HFA  Inhale 2 puffs into the lungs every 6 (six) hours as needed for wheezing.     aspirin EC 81 MG tablet  Take 81 mg by mouth 2 (two) times daily.     b complex vitamins tablet  Take 1 tablet by mouth daily.     carvedilol 12.5 MG tablet  Commonly known as:  COREG  Take 12.5 mg by mouth 2 (two) times daily with a meal.     docusate sodium 100 MG capsule  Commonly known as:  COLACE  Take 100 mg by mouth 2 (two) times daily.     furosemide 20 MG tablet  Commonly known as:  LASIX  Take 1 tablet (20 mg total) by mouth daily.     isosorbide-hydrALAZINE 20-37.5 MG per tablet  Commonly known as:  BIDIL  Take 1 tablet by mouth 2 (two) times daily.     levothyroxine 200 MCG tablet  Commonly known as:  SYNTHROID, LEVOTHROID  Take 200 mcg by mouth daily before  breakfast.     multivitamin with minerals Tabs tablet  Take 1 tablet by mouth daily.     oxyCODONE-acetaminophen 5-325 MG per tablet  Commonly known as:  PERCOCET  1-2 tabs po q6 hours prn pain         At home rest most of the time. Get up 9 or 10 times each day and take a 15 or 20 minute walk. No riding in the car and to your first postoperative appointment. If you have neck surgery you may shower from the chest down starting on the third postoperative day. If you had back surgery he may start showering on the third postoperative day with saran wrap wrapped around your incisional area 3 times. After the shower remove the saran wrap. Take pain medicine as needed and other medications as instructed. Call my office for an appointment.  SignedFaythe Ghee, MD 04/18/2015, 10:21 AM

## 2015-04-19 ENCOUNTER — Encounter (HOSPITAL_COMMUNITY): Payer: Self-pay | Admitting: Neurosurgery

## 2015-05-21 IMAGING — DX DG LUMBAR SPINE 1V
1 series · 1 of 1 positions shown · non-contrast
Comparison: MRI of the lumbar spine dated June 19, 2013

CLINICAL DATA: L4-L5 PLIF

EXAM:
LUMBAR SPINE - 1 VIEW

[lat]
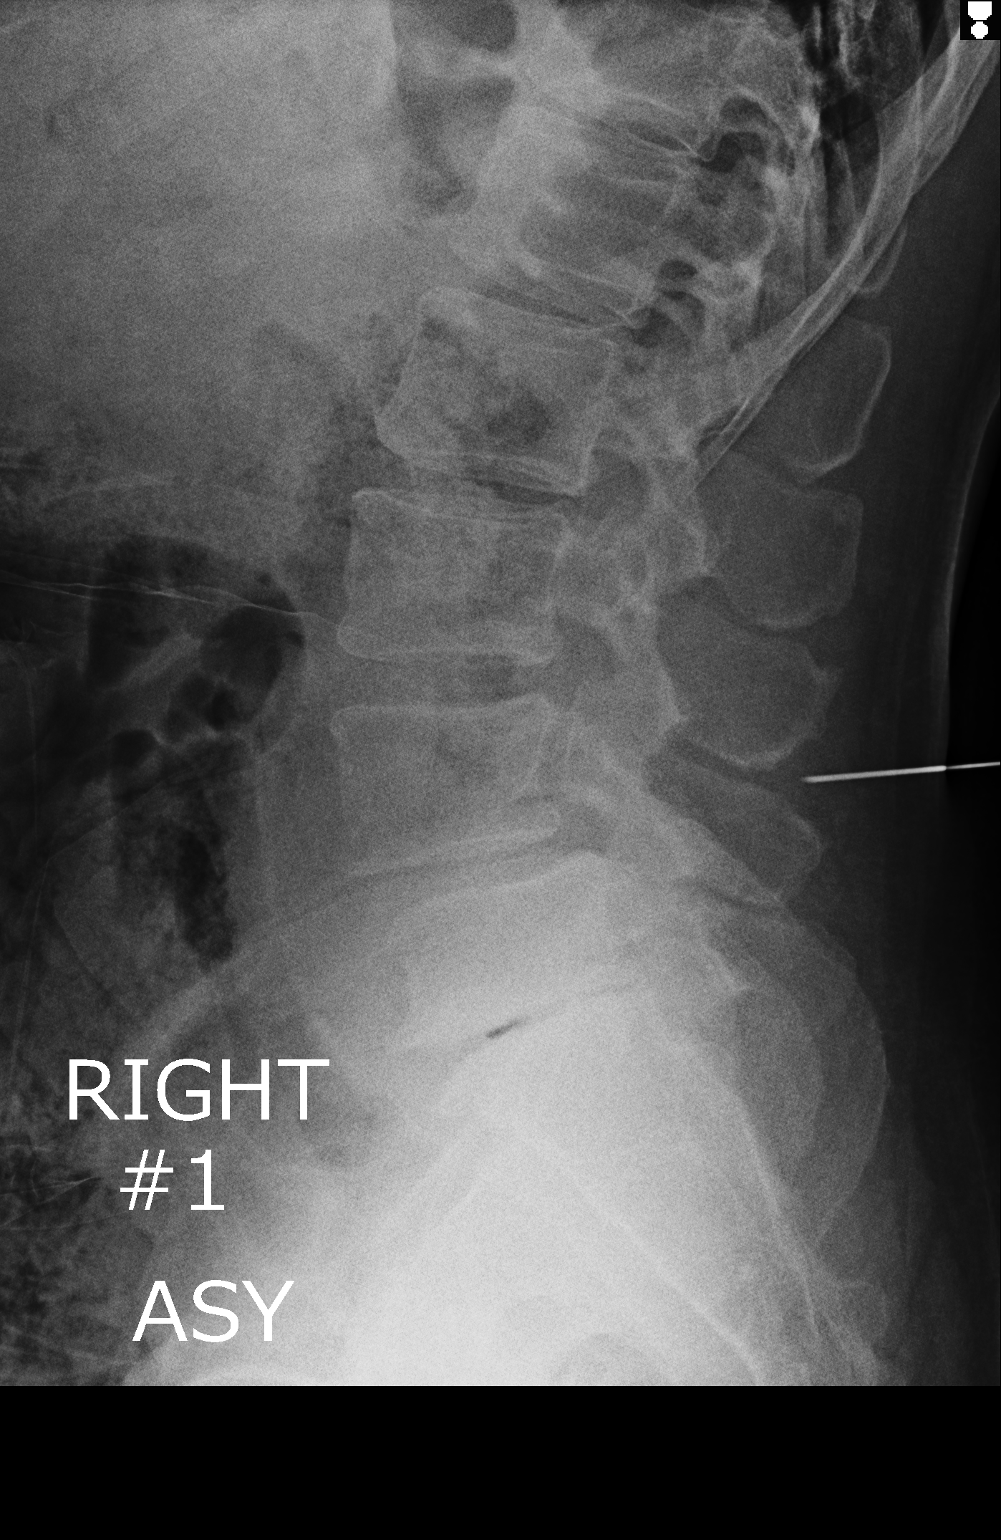

[1 of 1 positions shown; findings below may reference images not displayed]

FINDINGS: The metallic needle lies posterior and superior to the L4 spinous
process. The needle tip lies 6 cm posterior to the posterior margin
of the L4-5 disc space.
IMPRESSION: The metallic needle tip lies 6 cm posterior to the L4-5 disc level.

## 2015-08-17 ENCOUNTER — Telehealth: Payer: Self-pay | Admitting: Cardiology

## 2015-08-17 NOTE — Telephone Encounter (Signed)
Received records from [facility] for appointment on 09/02/15 with Dr Stanford Breed.  Records given to [facility] (medical records) for Dr Jacalyn Lefevre schedule on 09/02/15. lp

## 2015-08-24 NOTE — Progress Notes (Signed)
HPI: 64 year old male for evaluation of cardiomyopathy. Previously followed by Dr Nadyne Coombes. Echocardiogram June 2014 showed ejection fraction 15%, moderate left ventricular hypertrophy, mild left atrial enlargement and mild mitral regurgitation. Cardiac catheterization June 2014 showed ejection fraction 20%. There was no obstructive coronary disease noted. Patient treated medically. Echocardiogram September 2015 showed ejection fraction 42%, moderate left ventricular hypertrophy, grade 1 diastolic dysfunction, aneurysmal atrial septum with PFO versus ASD, mild LAE and mild mitral regurgitation.  Current Outpatient Prescriptions  Medication Sig Dispense Refill  . albuterol (PROVENTIL HFA;VENTOLIN HFA) 108 (90 BASE) MCG/ACT inhaler Inhale 2 puffs into the lungs every 6 (six) hours as needed for wheezing.    Marland Kitchen albuterol (PROVENTIL HFA;VENTOLIN HFA) 108 (90 BASE) MCG/ACT inhaler Inhale 2 puffs into the lungs every 6 (six) hours as needed for wheezing or shortness of breath. 1 Inhaler 2  . albuterol (PROVENTIL) (2.5 MG/3ML) 0.083% nebulizer solution Take 3 mLs (2.5 mg total) by nebulization every 6 (six) hours as needed for wheezing or shortness of breath (Albuterol Nebs used for Inhaler per [place]). 75 mL 12  . carvedilol (COREG) 12.5 MG tablet Take 12.5 mg by mouth 2 (two) times daily with a meal.    . docusate sodium (COLACE) 100 MG capsule Take 100 mg by mouth 2 (two) times daily.    . furosemide (LASIX) 20 MG tablet Take 1 tablet (20 mg total) by mouth daily. (Patient taking differently: Take 20 mg by mouth daily. Instructed to take as needed) 30 tablet   . isosorbide-hydrALAZINE (BIDIL) 20-37.5 MG per tablet Take 1 tablet by mouth 2 (two) times daily. 90 tablet 2  . levothyroxine (SYNTHROID, LEVOTHROID) 200 MCG tablet Take 200 mcg by mouth daily before breakfast.    . Multiple Vitamin (MULTIVITAMIN WITH MINERALS) TABS Take 1 tablet by mouth daily.    Marland Kitchen oxyCODONE (ROXICODONE) 5 MG  immediate release tablet Take 1 tablet (5 mg total) by mouth every 4 (four) hours as needed for severe pain. 45 tablet 0   No current facility-administered medications for this visit.    Allergies  Allergen Reactions  . Keflex [Cephalexin] Anaphylaxis    Past Medical History  Diagnosis Date  . Allergy   . Asthma   . Hypertension   . Substance abuse     not in many years  . Acute combined systolic and diastolic heart failure [id number]  . Shortness of breath     with asthma  . Cardiomyopathy 2014    "viral"  . Complication of anesthesia     problems urinating after anesthesia  . Wears glasses   . Wears partial dentures     top  . Back pain   . MRSA (methicillin resistant Staphylococcus aureus) infection     lumbar-2015  . Fatigue due to treatment   . Hypersomnia due to drug   . Obstructive sleep apnea syndrome 09/22/2014    uses cpap  . Hypothyroidism     had Graves' disease - thyroid ablation  . Arthritis     right knee    Past Surgical History  Procedure Laterality Date  . Cardiac catheterization  2014  . Anterior cervical decomp/discectomy fusion N/A 07/30/2013    Procedure: CERVICAL FIVE,CERVICAL SIX CORPECTOMY,ANTERIOR ARTHRODESIS,PEEK INTERBODY GRAFT CERVICAL FOUR-CERVICAL SEVEN,ANTERIOR INSTRUMENTATION;  Surgeon: Winfield Cunas, MD;  Location: [place] [facility];  Service: Neurosurgery;  Laterality: N/A;  . Lumbar wound debridement N/A 11/07/2013    Procedure: LUMBAR WOUND DEBRIDEMENT;  Surgeon: Winfield Cunas, MD;  Location: [place] [facility];  Service: Neurosurgery;  Laterality: N/A;  . Tonsillectomy  1958  . Back surgery  1/15    lumb fusion  . Colonoscopy  2003  . Carpal tunnel release Right 03/23/2014    Procedure: RIGHT CARPAL TUNNEL RELEASE;  Surgeon: Tennis Must, MD;  Location: [place];  Service: Orthopedics;  Laterality: Right;  . Left and right heart catheterization with coronary angiogram N/A 03/11/2013    Procedure: LEFT AND RIGHT HEART  CATHETERIZATION WITH CORONARY ANGIOGRAM;  Surgeon: Laverda Page, MD;  [facility] C[facility] LAB;  Service: Cardiovascular;  Laterality: N/A;  . Anterior cervical decomp/discectomy fusion N/A 04/16/2015    Procedure: CERVICAL THREE-FOUR ANTERIOR CERVICAL DECOMPRESSION/DISCECTOMY FUSION 1 LEVEL;  Surgeon: Ashok Pall, MD;  Location: [place] [facility];  Service: Neurosurgery;  Laterality: N/A;  C34 anterior cervical decompression with fusion plating and bonegraft    Social History   Social History  . Marital Status: Married    Spouse Name: Rise Paganini  . Number of Children: 4  . Years of Education: College   Occupational History  .     Social History Main Topics  . Smoking status: Former Smoker -- 20 years    Quit date: 08/14/1995  . Smokeless tobacco: Never Used  . Alcohol Use: No  . Drug Use: No  . Sexual Activity: Yes   Other Topics Concern  . 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   Patient has four adult children.   Patient is disabled.   Patient has college education.   Patient is right-handed.   Patient drinks very little caffeine.    Family History  Problem Relation Age of Onset  . Colon cancer Neg Hx     ROS: no fevers or chills, productive cough, hemoptysis, dysphasia, odynophagia, melena, hematochezia, dysuria, hematuria, rash, seizure activity, orthopnea, PND, pedal edema, claudication. Remaining systems are negative.  Physical Exam:   There were no vitals taken for this visit.  General:  Well developed/well nourished in NAD Skin warm/dry Patient not depressed No peripheral clubbing Back-normal HEENT-normal/normal eyelids Neck supple/normal carotid upstroke bilaterally; no bruits; no JVD; no thyromegaly chest - CTA/ normal expansion CV - RRR/normal S1 and S2; no murmurs, rubs or gallops;  PMI nondisplaced Abdomen -NT/ND, no HSM, no mass, + bowel sounds, no bruit 2+ femoral pulses, no  bruits Ext-no edema, chords, 2+ DP Neuro-grossly nonfocal  ECG    This encounter was created in error - please disregard.

## 2015-09-02 ENCOUNTER — Encounter: Payer: 59 | Admitting: Cardiology

## 2016-02-11 DIAGNOSIS — E038 Other specified hypothyroidism: Secondary | ICD-10-CM | POA: Diagnosis not present

## 2016-02-11 DIAGNOSIS — Z0000 Encounter for general adult medical examination without abnormal findings: Secondary | ICD-10-CM | POA: Diagnosis not present

## 2016-02-11 DIAGNOSIS — R739 Hyperglycemia, unspecified: Secondary | ICD-10-CM | POA: Diagnosis not present

## 2016-02-11 DIAGNOSIS — Z125 Encounter for screening for malignant neoplasm of prostate: Secondary | ICD-10-CM | POA: Diagnosis not present

## 2016-02-11 DIAGNOSIS — I10 Essential (primary) hypertension: Secondary | ICD-10-CM | POA: Diagnosis not present

## 2016-02-11 DIAGNOSIS — E784 Other hyperlipidemia: Secondary | ICD-10-CM | POA: Diagnosis not present

## 2016-02-11 DIAGNOSIS — E538 Deficiency of other specified B group vitamins: Secondary | ICD-10-CM | POA: Diagnosis not present

## 2016-02-17 DIAGNOSIS — Z0000 Encounter for general adult medical examination without abnormal findings: Secondary | ICD-10-CM | POA: Diagnosis not present

## 2016-02-17 DIAGNOSIS — I5022 Chronic systolic (congestive) heart failure: Secondary | ICD-10-CM | POA: Diagnosis not present

## 2016-02-17 DIAGNOSIS — R627 Adult failure to thrive: Secondary | ICD-10-CM | POA: Diagnosis not present

## 2016-02-17 DIAGNOSIS — R739 Hyperglycemia, unspecified: Secondary | ICD-10-CM | POA: Diagnosis not present

## 2016-02-17 DIAGNOSIS — G894 Chronic pain syndrome: Secondary | ICD-10-CM | POA: Diagnosis not present

## 2016-02-17 DIAGNOSIS — E038 Other specified hypothyroidism: Secondary | ICD-10-CM | POA: Diagnosis not present

## 2016-02-17 DIAGNOSIS — Z1389 Encounter for screening for other disorder: Secondary | ICD-10-CM | POA: Diagnosis not present

## 2016-02-17 DIAGNOSIS — Z125 Encounter for screening for malignant neoplasm of prostate: Secondary | ICD-10-CM | POA: Diagnosis not present

## 2016-02-17 DIAGNOSIS — Z6828 Body mass index (BMI) 28.0-28.9, adult: Secondary | ICD-10-CM | POA: Diagnosis not present

## 2016-02-17 DIAGNOSIS — I10 Essential (primary) hypertension: Secondary | ICD-10-CM | POA: Diagnosis not present

## 2016-02-17 DIAGNOSIS — K5903 Drug induced constipation: Secondary | ICD-10-CM | POA: Diagnosis not present

## 2016-04-03 DIAGNOSIS — I429 Cardiomyopathy, unspecified: Secondary | ICD-10-CM | POA: Diagnosis not present

## 2016-04-03 DIAGNOSIS — I5032 Chronic diastolic (congestive) heart failure: Secondary | ICD-10-CM | POA: Diagnosis not present

## 2016-04-03 DIAGNOSIS — I10 Essential (primary) hypertension: Secondary | ICD-10-CM | POA: Diagnosis not present

## 2016-04-10 ENCOUNTER — Encounter (HOSPITAL_COMMUNITY): Payer: Self-pay | Admitting: Emergency Medicine

## 2016-04-10 DIAGNOSIS — Z87891 Personal history of nicotine dependence: Secondary | ICD-10-CM

## 2016-04-10 DIAGNOSIS — D62 Acute posthemorrhagic anemia: Secondary | ICD-10-CM | POA: Diagnosis present

## 2016-04-10 DIAGNOSIS — I110 Hypertensive heart disease with heart failure: Secondary | ICD-10-CM | POA: Diagnosis not present

## 2016-04-10 DIAGNOSIS — K5791 Diverticulosis of intestine, part unspecified, without perforation or abscess with bleeding: Principal | ICD-10-CM | POA: Diagnosis present

## 2016-04-10 DIAGNOSIS — I428 Other cardiomyopathies: Secondary | ICD-10-CM | POA: Diagnosis not present

## 2016-04-10 DIAGNOSIS — G4733 Obstructive sleep apnea (adult) (pediatric): Secondary | ICD-10-CM | POA: Diagnosis present

## 2016-04-10 DIAGNOSIS — K922 Gastrointestinal hemorrhage, unspecified: Secondary | ICD-10-CM | POA: Diagnosis not present

## 2016-04-10 DIAGNOSIS — I959 Hypotension, unspecified: Secondary | ICD-10-CM | POA: Diagnosis present

## 2016-04-10 DIAGNOSIS — J45909 Unspecified asthma, uncomplicated: Secondary | ICD-10-CM | POA: Diagnosis present

## 2016-04-10 DIAGNOSIS — I5032 Chronic diastolic (congestive) heart failure: Secondary | ICD-10-CM | POA: Diagnosis not present

## 2016-04-10 DIAGNOSIS — E039 Hypothyroidism, unspecified: Secondary | ICD-10-CM | POA: Diagnosis present

## 2016-04-10 DIAGNOSIS — Z79899 Other long term (current) drug therapy: Secondary | ICD-10-CM

## 2016-04-10 DIAGNOSIS — Z981 Arthrodesis status: Secondary | ICD-10-CM

## 2016-04-10 DIAGNOSIS — Z881 Allergy status to other antibiotic agents status: Secondary | ICD-10-CM

## 2016-04-10 LAB — COMPREHENSIVE METABOLIC PANEL
ALBUMIN: 3.8 g/dL (ref 3.5–5.0)
ALK PHOS: 77 U/L (ref 38–126)
ALT: 14 U/L — AB (ref 17–63)
AST: 18 U/L (ref 15–41)
Anion gap: 6 (ref 5–15)
BUN: 14 mg/dL (ref 6–20)
CALCIUM: 9.3 mg/dL (ref 8.9–10.3)
CO2: 27 mmol/L (ref 22–32)
CREATININE: 1.05 mg/dL (ref 0.61–1.24)
Chloride: 109 mmol/L (ref 101–111)
GFR calc Af Amer: >60 mL/min (ref >60)
GFR calc non Af Amer: >60 mL/min (ref >60)
GLUCOSE: 105 mg/dL — AB (ref 65–99)
Potassium: 4.3 mmol/L (ref 3.5–5.1)
SODIUM: 142 mmol/L (ref 135–145)
Total Bilirubin: 0.6 mg/dL (ref 0.3–1.2)
Total Protein: 6.8 g/dL (ref 6.5–8.1)

## 2016-04-10 LAB — CBC
HCT: 37.3 % — ABNORMAL LOW (ref 39.0–52.0)
Hemoglobin: 12.4 g/dL — ABNORMAL LOW (ref 13.0–17.0)
MCH: 28.2 pg (ref 26.0–34.0)
MCHC: 33.2 g/dL (ref 30.0–36.0)
MCV: 85 fL (ref 78.0–100.0)
PLATELETS: 259 10*3/uL (ref 150–400)
RBC: 4.39 MIL/uL (ref 4.22–5.81)
RDW: 13.5 % (ref 11.5–15.5)
WBC: 5.9 10*3/uL (ref 4.0–10.5)

## 2016-04-10 NOTE — ED Notes (Addendum)
Pt approached nurses station, ambulatory with steady gait, and requested this RN to put a note into his chart stating that he approached the nurses station to inform staff that he knows he is bleeding internally. Pt stated he did not feel lightheaded but wanted a note in his chart "because if I code out here in the waiting room I want it to be known that I told the staff that I am bleeding internally." This RN reviewed patient's resulted lab work and vital signs from triage and informed patient there are no available rooms at this time but he was triaged at a higher acuity and would be taken back to the next available room based on his acuity level. Pt verbalized understanding and ambulated back to chair in waiting lobby with steady gait.

## 2016-04-10 NOTE — ED Notes (Signed)
Pt. reports bloody stools/rectal bleeding onset this afternoon , denies abdominal pain / no emesis or fever .

## 2016-04-11 ENCOUNTER — Inpatient Hospital Stay (HOSPITAL_COMMUNITY)
Admission: EM | Admit: 2016-04-11 | Discharge: 2016-04-13 | DRG: 378 | Disposition: A | Discharge status: Home / Self Care | Payer: Medicare Other | Attending: Internal Medicine | Admitting: Internal Medicine

## 2016-04-11 ENCOUNTER — Encounter (HOSPITAL_COMMUNITY): Payer: Self-pay | Admitting: Emergency Medicine

## 2016-04-11 ENCOUNTER — Inpatient Hospital Stay (HOSPITAL_COMMUNITY): Payer: Medicare Other

## 2016-04-11 DIAGNOSIS — I701 Atherosclerosis of renal artery: Secondary | ICD-10-CM | POA: Diagnosis not present

## 2016-04-11 DIAGNOSIS — G4733 Obstructive sleep apnea (adult) (pediatric): Secondary | ICD-10-CM | POA: Diagnosis present

## 2016-04-11 DIAGNOSIS — Z79899 Other long term (current) drug therapy: Secondary | ICD-10-CM | POA: Diagnosis not present

## 2016-04-11 DIAGNOSIS — I110 Hypertensive heart disease with heart failure: Secondary | ICD-10-CM | POA: Diagnosis present

## 2016-04-11 DIAGNOSIS — Z87891 Personal history of nicotine dependence: Secondary | ICD-10-CM | POA: Diagnosis not present

## 2016-04-11 DIAGNOSIS — E039 Hypothyroidism, unspecified: Secondary | ICD-10-CM | POA: Diagnosis not present

## 2016-04-11 DIAGNOSIS — J45909 Unspecified asthma, uncomplicated: Secondary | ICD-10-CM | POA: Diagnosis present

## 2016-04-11 DIAGNOSIS — K922 Gastrointestinal hemorrhage, unspecified: Secondary | ICD-10-CM

## 2016-04-11 DIAGNOSIS — I10 Essential (primary) hypertension: Secondary | ICD-10-CM | POA: Diagnosis not present

## 2016-04-11 DIAGNOSIS — I959 Hypotension, unspecified: Secondary | ICD-10-CM | POA: Diagnosis present

## 2016-04-11 DIAGNOSIS — Z981 Arthrodesis status: Secondary | ICD-10-CM | POA: Diagnosis not present

## 2016-04-11 DIAGNOSIS — K5791 Diverticulosis of intestine, part unspecified, without perforation or abscess with bleeding: Secondary | ICD-10-CM | POA: Diagnosis present

## 2016-04-11 DIAGNOSIS — Z881 Allergy status to other antibiotic agents status: Secondary | ICD-10-CM | POA: Diagnosis not present

## 2016-04-11 DIAGNOSIS — I5032 Chronic diastolic (congestive) heart failure: Secondary | ICD-10-CM | POA: Diagnosis present

## 2016-04-11 DIAGNOSIS — D62 Acute posthemorrhagic anemia: Secondary | ICD-10-CM

## 2016-04-11 DIAGNOSIS — I428 Other cardiomyopathies: Secondary | ICD-10-CM | POA: Diagnosis present

## 2016-04-11 HISTORY — DX: Diverticulosis of large intestine without perforation or abscess without bleeding: K57.30

## 2016-04-11 HISTORY — DX: Acute posthemorrhagic anemia: D62

## 2016-04-11 HISTORY — DX: Polyp of colon: K63.5

## 2016-04-11 LAB — I-STAT CHEM 8, ED
BUN: 20 mg/dL (ref 6–20)
CALCIUM ION: 1.23 mmol/L (ref 1.12–1.23)
CHLORIDE: 105 mmol/L (ref 101–111)
CREATININE: 1 mg/dL (ref 0.61–1.24)
GLUCOSE: 153 mg/dL — AB (ref 65–99)
HCT: 34 % — ABNORMAL LOW (ref 39.0–52.0)
Hemoglobin: 11.6 g/dL — ABNORMAL LOW (ref 13.0–17.0)
POTASSIUM: 3.4 mmol/L — AB (ref 3.5–5.1)
SODIUM: 142 mmol/L (ref 135–145)
TCO2: 25 mmol/L (ref 0–100)

## 2016-04-11 LAB — CBC
HCT: 33.5 % — ABNORMAL LOW (ref 39.0–52.0)
HCT: 31.4 % — ABNORMAL LOW (ref 39.0–52.0)
HEMOGLOBIN: 10.3 g/dL — AB (ref 13.0–17.0)
Hemoglobin: 10.8 g/dL — ABNORMAL LOW (ref 13.0–17.0)
MCH: 27.4 pg (ref 26.0–34.0)
MCH: 27.2 pg (ref 26.0–34.0)
MCHC: 32.2 g/dL (ref 30.0–36.0)
MCHC: 32.8 g/dL (ref 30.0–36.0)
MCV: 85 fL (ref 78.0–100.0)
MCV: 83.1 fL (ref 78.0–100.0)
PLATELETS: 250 10*3/uL (ref 150–400)
PLATELETS: 185 10*3/uL (ref 150–400)
RBC: 3.94 MIL/uL — ABNORMAL LOW (ref 4.22–5.81)
RBC: 3.78 MIL/uL — AB (ref 4.22–5.81)
RDW: 13.6 % (ref 11.5–15.5)
RDW: 13.9 % (ref 11.5–15.5)
WBC: 7.3 10*3/uL (ref 4.0–10.5)
WBC: 8.5 10*3/uL (ref 4.0–10.5)

## 2016-04-11 LAB — GLUCOSE, CAPILLARY
GLUCOSE-CAPILLARY: 72 mg/dL (ref 65–99)
Glucose-Capillary: 93 mg/dL (ref 65–99)
Glucose-Capillary: 91 mg/dL (ref 65–99)
Glucose-Capillary: 97 mg/dL (ref 65–99)

## 2016-04-11 LAB — COMPREHENSIVE METABOLIC PANEL
ALT: 14 U/L — ABNORMAL LOW (ref 17–63)
AST: 16 U/L (ref 15–41)
Albumin: 3.4 g/dL — ABNORMAL LOW (ref 3.5–5.0)
Alkaline Phosphatase: 67 U/L (ref 38–126)
Anion gap: 7 (ref 5–15)
BUN: 17 mg/dL (ref 6–20)
CHLORIDE: 107 mmol/L (ref 101–111)
CO2: 26 mmol/L (ref 22–32)
CREATININE: 1.03 mg/dL (ref 0.61–1.24)
Calcium: 8.9 mg/dL (ref 8.9–10.3)
Glucose, Bld: 155 mg/dL — ABNORMAL HIGH (ref 65–99)
POTASSIUM: 3.5 mmol/L (ref 3.5–5.1)
Sodium: 140 mmol/L (ref 135–145)
Total Bilirubin: 0.7 mg/dL (ref 0.3–1.2)
Total Protein: 6 g/dL — ABNORMAL LOW (ref 6.5–8.1)

## 2016-04-11 LAB — MRSA PCR SCREENING: MRSA BY PCR: NEGATIVE

## 2016-04-11 LAB — PREPARE RBC (CROSSMATCH)

## 2016-04-11 LAB — PROTIME-INR
INR: 1.03 (ref 0.00–1.49)
Prothrombin Time: 13.7 seconds (ref 11.6–15.2)

## 2016-04-11 LAB — APTT: aPTT: 29 seconds (ref 24–37)

## 2016-04-11 LAB — I-STAT CG4 LACTIC ACID, ED: Lactic Acid, Venous: 1.3 mmol/L (ref 0.5–1.9)

## 2016-04-11 MED ORDER — PANTOPRAZOLE SODIUM 40 MG IV SOLR: 40.0000 mg | Freq: Two times a day (BID) | INTRAVENOUS | Status: DC

## 2016-04-11 MED ORDER — METOPROLOL TARTRATE 50 MG PO TABS
50.0000 mg | ORAL_TABLET | Freq: Two times a day (BID) | ORAL | Status: DC
Start: 2016-04-11 — End: 2016-04-13
  Administered 2016-04-11 – 2016-04-13 (×4): 50 mg via ORAL
  Filled 2016-04-11 (×5): qty 1

## 2016-04-11 MED ORDER — ALBUTEROL SULFATE (2.5 MG/3ML) 0.083% IN NEBU
5.0000 mg | INHALATION_SOLUTION | Freq: Once | RESPIRATORY_TRACT | Status: AC
  Administered 2016-04-11: 5 mg via RESPIRATORY_TRACT
  Filled 2016-04-11: qty 6

## 2016-04-11 MED ORDER — SODIUM CHLORIDE 0.9 % IV SOLN: 10.0000 mL/h | Freq: Once | INTRAVENOUS | Status: DC

## 2016-04-11 MED ORDER — POTASSIUM CHLORIDE 10 MEQ/100ML IV SOLN
10.0000 meq | INTRAVENOUS | Status: AC
  Administered 2016-04-11: 10 meq via INTRAVENOUS
  Filled 2016-04-11: qty 100

## 2016-04-11 MED ORDER — ONDANSETRON HCL 4 MG/2ML IJ SOLN: 4.0000 mg | Freq: Four times a day (QID) | INTRAMUSCULAR | Status: DC | PRN

## 2016-04-11 MED ORDER — ACETAMINOPHEN 650 MG RE SUPP: 650.0000 mg | Freq: Four times a day (QID) | RECTAL | Status: DC | PRN

## 2016-04-11 MED ORDER — SODIUM CHLORIDE 0.9 % IV SOLN: INTRAVENOUS | Status: DC

## 2016-04-11 MED ORDER — ALBUTEROL SULFATE (2.5 MG/3ML) 0.083% IN NEBU
2.5000 mg | INHALATION_SOLUTION | Freq: Four times a day (QID) | RESPIRATORY_TRACT | Status: DC | PRN
  Administered 2016-04-11 – 2016-04-13 (×4): 2.5 mg via RESPIRATORY_TRACT
  Filled 2016-04-11 (×4): qty 3

## 2016-04-11 MED ORDER — ALBUTEROL SULFATE (2.5 MG/3ML) 0.083% IN NEBU
2.5000 mg | INHALATION_SOLUTION | Freq: Three times a day (TID) | RESPIRATORY_TRACT | Status: DC
  Administered 2016-04-12 – 2016-04-13 (×5): 2.5 mg via RESPIRATORY_TRACT
  Filled 2016-04-11 (×5): qty 3

## 2016-04-11 MED ORDER — LABETALOL HCL 5 MG/ML IV SOLN: 5.0000 mg | INTRAVENOUS | Status: DC | PRN

## 2016-04-11 MED ORDER — SODIUM CHLORIDE 0.9 % IV SOLN
8.0000 mg/h | INTRAVENOUS | Status: DC
  Administered 2016-04-11: 8 mg/h via INTRAVENOUS
  Filled 2016-04-11 (×2): qty 80

## 2016-04-11 MED ORDER — OXYCODONE HCL 5 MG PO TABS
5.0000 mg | ORAL_TABLET | ORAL | Status: DC | PRN
  Administered 2016-04-11 – 2016-04-12 (×2): 5 mg via ORAL
  Filled 2016-04-11 (×2): qty 1

## 2016-04-11 MED ORDER — HYDRALAZINE HCL 20 MG/ML IJ SOLN
10.0000 mg | Freq: Once | INTRAMUSCULAR | Status: AC
  Administered 2016-04-11: 10 mg via INTRAVENOUS
  Filled 2016-04-11: qty 1

## 2016-04-11 MED ORDER — POTASSIUM CHLORIDE 10 MEQ/100ML IV SOLN: 10.0000 meq | Freq: Once | INTRAVENOUS | Status: AC

## 2016-04-11 MED ORDER — SODIUM CHLORIDE 0.9% FLUSH
10.0000 mL | Freq: Two times a day (BID) | INTRAVENOUS | Status: DC
  Administered 2016-04-11 – 2016-04-13 (×4): 10 mL

## 2016-04-11 MED ORDER — SODIUM CHLORIDE 0.9% FLUSH: 10.0000 mL | INTRAVENOUS | Status: DC | PRN

## 2016-04-11 MED ORDER — LEVOTHYROXINE SODIUM 200 MCG PO TABS
200.0000 ug | ORAL_TABLET | Freq: Every day | ORAL | Status: DC
  Filled 2016-04-11: qty 1

## 2016-04-11 MED ORDER — SODIUM CHLORIDE 0.9 % IV SOLN
INTRAVENOUS | Status: AC
  Administered 2016-04-11: 22:00:00 via INTRAVENOUS

## 2016-04-11 MED ORDER — SODIUM CHLORIDE 0.9 % IV SOLN
Freq: Once | INTRAVENOUS | Status: AC
  Administered 2016-04-11: 06:00:00 via INTRAVENOUS

## 2016-04-11 MED ORDER — ACETAMINOPHEN 325 MG PO TABS
650.0000 mg | ORAL_TABLET | Freq: Four times a day (QID) | ORAL | Status: DC | PRN
  Administered 2016-04-12: 650 mg via ORAL
  Filled 2016-04-11: qty 2

## 2016-04-11 MED ORDER — POTASSIUM CHLORIDE 10 MEQ/100ML IV SOLN
INTRAVENOUS | Status: AC
  Administered 2016-04-11: 10 meq
  Filled 2016-04-11: qty 100

## 2016-04-11 MED ORDER — PANTOPRAZOLE SODIUM 40 MG IV SOLR
40.0000 mg | INTRAVENOUS | Status: DC
  Administered 2016-04-11: 40 mg via INTRAVENOUS
  Filled 2016-04-11 (×2): qty 40

## 2016-04-11 MED ORDER — PANTOPRAZOLE SODIUM 40 MG IV SOLR
80.0000 mg | Freq: Once | INTRAVENOUS | Status: AC
  Administered 2016-04-11: 80 mg via INTRAVENOUS
  Filled 2016-04-11: qty 80

## 2016-04-11 MED ORDER — ONDANSETRON HCL 4 MG PO TABS: 4.0000 mg | ORAL_TABLET | Freq: Four times a day (QID) | ORAL | Status: DC | PRN

## 2016-04-11 MED ORDER — IOPAMIDOL (ISOVUE-370) INJECTION 76%
INTRAVENOUS | Status: AC
  Administered 2016-04-11: 100 mL
  Filled 2016-04-11: qty 100

## 2016-04-11 MED ORDER — OXYCODONE HCL 5 MG PO TABS: 5.0000 mg | ORAL_TABLET | ORAL | Status: DC | PRN

## 2016-04-11 NOTE — Consult Note (Signed)
[facility] Referring Provider: Dr. Hal Hope (triad) Primary Care Physician:  [name], MD Primary Gastroenterologist:  Dr. Erskine Emery (previously)  Reason for Consultation: Gi bleeding   HPI:  Peter [name] is a 65 y.o. male who started to pass painless BRBPR yesterday around 3pm.  After the 3rd or 4th large volume (with clots) he came to the ER.  In ER he continued to pass red blood and even some darker blood.  He was transiently hypotensive but has responded to IV fluids and currently in MICU he's almost finished his 2nd unit of PRBC.  He was also put on IV PPI (bolus and drip).  Hb has trended down, see below.  BUN was normal at admission. He's had no SOB, no CP.  He takes 81mg  ASA once daily.  Does not take NSAIDs.  He's had no abd pains, no nausea or vomiting.  He's never had bleeding like this before.  He had colonoscopy 05/2014 with Dr. Deatra Ina for average risk colon cancer screening: 1 small polyp was removed, diverticulosis was noted in ascending, transverse and sigmoid segments.  The polyp was a typical appearing tubular adenoma and he was recommended to have surveillance colonoscopy at 5 year interval.    Past Medical History  Diagnosis Date  . Allergy   . Asthma   . Hypertension   . Substance abuse     not in many years  . Acute combined systolic and diastolic heart failure ([place]) 03/11/2013  . Shortness of breath     with asthma  . Cardiomyopathy ([place]) 2014    "viral"  . Complication of anesthesia     problems urinating after anesthesia  . Wears glasses   . Wears partial dentures     top  . Back pain   . MRSA (methicillin resistant Staphylococcus aureus) infection     lumbar-2015  . Fatigue due to treatment   . Hypersomnia due to drug ([place])   . Obstructive sleep apnea syndrome 09/22/2014    uses cpap  . Hypothyroidism     had Graves' disease - thyroid ablation  . Arthritis     right knee    Past Surgical History  Procedure Laterality  Date  . Cardiac catheterization  2014  . Anterior cervical decomp/discectomy fusion N/A 07/30/2013    Procedure: CERVICAL FIVE,CERVICAL SIX CORPECTOMY,ANTERIOR ARTHRODESIS,PEEK INTERBODY GRAFT CERVICAL FOUR-CERVICAL SEVEN,ANTERIOR INSTRUMENTATION;  Surgeon: Winfield Cunas, MD;  Location: [place] [facility];  Service: Neurosurgery;  Laterality: N/A;  . Lumbar wound debridement N/A 11/07/2013    Procedure: LUMBAR WOUND DEBRIDEMENT;  Surgeon: Winfield Cunas, MD;  Location: [place] [facility];  Service: Neurosurgery;  Laterality: N/A;  . Tonsillectomy  1958  . Back surgery  1/15    lumb fusion  . Colonoscopy  2003  . Carpal tunnel release Right 03/23/2014    Procedure: RIGHT CARPAL TUNNEL RELEASE;  Surgeon: Tennis Must, MD;  Location: [place];  Service: Orthopedics;  Laterality: Right;  . Left and right heart catheterization with coronary angiogram N/A 03/11/2013    Procedure: LEFT AND RIGHT HEART CATHETERIZATION WITH CORONARY ANGIOGRAM;  Surgeon: Laverda Page, MD;  Location: [facility] CATH LAB;  Service: Cardiovascular;  Laterality: N/A;  . Anterior cervical decomp/discectomy fusion N/A 04/16/2015    Procedure: CERVICAL THREE-FOUR ANTERIOR CERVICAL DECOMPRESSION/DISCECTOMY FUSION 1 LEVEL;  Surgeon: Ashok Pall, MD;  Location: [place] [facility];  Service: Neurosurgery;  Laterality: N/A;  C34 anterior cervical decompression with fusion plating and bonegraft    Prior  to Admission medications   Medication Sig Start Date End Date Taking? Authorizing Provider  albuterol (PROVENTIL) (2.5 MG/3ML) 0.083% nebulizer solution Take 3 mLs (2.5 mg total) by nebulization every 6 (six) hours as needed for wheezing or shortness of breath (Albuterol Nebs used for Inhaler per [place]). 04/18/15  Yes Karie Chimera, MD  docusate sodium (COLACE) 100 MG capsule Take 100 mg by mouth 2 (two) times daily.   Yes Historical Provider, MD  furosemide (LASIX) 20 MG tablet Take 1 tablet (20 mg total) by mouth  daily. Patient taking differently: Take 20 mg by mouth daily. Instructed to take as needed 08/25/13  Yes Shon Baton, MD  isosorbide-hydrALAZINE (BIDIL) 20-37.5 MG per tablet Take 1 tablet by mouth 2 (two) times daily. 08/14/13  Yes Shon Baton, MD  levothyroxine (SYNTHROID, LEVOTHROID) 200 MCG tablet Take 200 mcg by mouth daily before breakfast.   Yes Historical Provider, MD  metoprolol succinate (TOPROL-XL) 100 MG 24 hr tablet Take 100 mg by mouth daily. 04/03/16  Yes Historical Provider, MD  Multiple Vitamin (MULTIVITAMIN WITH MINERALS) TABS Take 1 tablet by mouth daily.   Yes Historical Provider, MD  Omega-3 Fatty Acids (FISH OIL) 1000 MG CAPS Take 3,000 mg by mouth daily.   Yes Historical Provider, MD  oxyCODONE (ROXICODONE) 5 MG immediate release tablet Take 1 tablet (5 mg total) by mouth every 4 (four) hours as needed for severe pain. 04/18/15  Yes Karie Chimera, MD  albuterol (PROVENTIL HFA;VENTOLIN HFA) 108 (90 BASE) MCG/ACT inhaler Inhale 2 puffs into the lungs every 6 (six) hours as needed for wheezing or shortness of breath. Patient not taking: Reported on 04/11/2016 04/18/15   Karie Chimera, MD    Current Facility-Administered Medications  Medication Dose Route Frequency Provider Last Rate Last Dose  . 0.9 %  sodium chloride infusion   Intravenous Continuous Rise Patience, MD      . 0.9 %  sodium chloride infusion  10 mL/hr Intravenous Once Ripley Fraise, MD      . acetaminophen (TYLENOL) tablet 650 mg  650 mg Oral Q6H PRN Rise Patience, MD       Or  . acetaminophen (TYLENOL) suppository 650 mg  650 mg Rectal Q6H PRN Rise Patience, MD      . albuterol (PROVENTIL) (2.5 MG/3ML) 0.083% nebulizer solution 2.5 mg  2.5 mg Nebulization Q6H PRN Rise Patience, MD      . ondansetron [facility]) tablet 4 mg  4 mg Oral Q6H PRN Rise Patience, MD       Or  . ondansetron [facility]) injection 4 mg  4 mg Intravenous Q6H PRN Rise Patience, MD      . pantoprazole (PROTONIX)  80 mg in sodium chloride 0.9 % 250 mL (0.32 mg/mL) infusion  8 mg/hr Intravenous Continuous Rise Patience, MD 25 mL/hr at 04/11/16 0629 8 mg/hr at 04/11/16 0629  . [START ON 04/14/2016] pantoprazole (PROTONIX) injection 40 mg  40 mg Intravenous Q12H Rise Patience, MD        Allergies as of 04/10/2016 - Review Complete 04/10/2016  Allergen Reaction Noted  . Keflex [cephalexin] Anaphylaxis 03/10/2013    Family History  Problem Relation Age of Onset  . Colon cancer Neg Hx     Social History   Social History  . Marital Status: Married    Spouse Name: Rise Paganini  . Number of Children: 4  . Years of Education: College   Occupational History  .  Social History Main Topics  . Smoking status: Former Smoker -- 0 years    Quit date: 08/14/1995  . Smokeless tobacco: Never Used  . Alcohol Use: No  . Drug Use: No  . Sexual Activity: Yes   Other Topics Concern  . 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   Patient has four adult children.   Patient is disabled.   Patient has college education.   Patient is right-handed.   Patient drinks very little caffeine.     Review of Systems: Pertinent positive and negative review of systems were noted in the above HPI section. Complete review of systems was performed and was otherwise normal.   Physical Exam: Vital signs in last 24 hours: Temp:  [97.3 F (36.3 C)-98 F (36.7 C)] 97.4 F (36.3 C) (07/18 0726) Pulse Rate:  [56-92] 74 (07/18 0700) Resp:  [12-23] 16 (07/18 0700) BP: (83-164)/(60-99) 145/95 mmHg (07/18 0700) SpO2:  [94 %-100 %] 100 % (07/18 0700) Weight:  [220 lb 0.3 oz (99.8 kg)-225 lb (102.059 kg)] 220 lb 0.3 oz (99.8 kg) (07/18 0600) Last BM Date: 04/11/16 Constitutional: generally well-appearing Psychiatric: alert and oriented x3 Eyes: extraocular movements intact Mouth: oral pharynx moist, no lesions Neck: supple no  lymphadenopathy Cardiovascular: heart regular rate and rhythm Lungs: clear to auscultation bilaterally Abdomen: soft, nontender, nondistended, no obvious ascites, no peritoneal signs, normal bowel sounds Extremities: no lower extremity edema bilaterally Skin: no lesions on visible extremities   Lab Results:  Recent Labs  04/10/16 2033 04/11/16 0409 04/11/16 0416  WBC 5.9 7.3  --   HGB 12.4* 10.8* 11.6*  HCT 37.3* 33.5* 34.0*  PLT 259 250  --   MCV 85.0 85.0  --    BMET  Recent Labs  04/10/16 2033 04/11/16 0409 04/11/16 0416  NA 142 140 142  K 4.3 3.5 3.4*  CL 109 107 105  CO2 27 26  --   GLUCOSE 105* 155* 153*  BUN 14 17 20   CREATININE 1.05 1.03 1.00  CALCIUM 9.3 8.9  --    LFT  Recent Labs  04/11/16 0409  BILITOT 0.7  AST 16  ALT 14*  ALKPHOS 67  PROT 6.0*  ALBUMIN 3.4*   PT/INR  Recent Labs  04/11/16 0409  LABPROT 13.7  INR 1.03    Impression/Plan: 65 y.o. male with acute Gi bleeding  This is most likely a lower GI source (BRB, normal BUN at presentation), presumed diverticular in origin. AVM could present similarly.  He feels the bleeding is starting to slow down (had 3 hours between bleeding outputs just recently, compared to every 1 hour last night).  He understands that most lower Gi bleeding will stop without need for therapy.  Hopefully that will be the case here as well.  I've ordered CT (GI bleed protocol) to help localize the source in case bleeding does not stop. If the bleeding continues, will likely proceed with colonoscopy using CT target information if available.  I've stopped IV PPi infusion and have changed to BID IV PPI for now (seems less likely to be an UGI bleed).    Milus Banister, MD  04/11/2016, 7:39 AM [facility] Pager [phone]

## 2016-04-11 NOTE — H&P (Addendum)
History and Physical    Peter [name] [id number] DOB: 1951-04-13 DOA: 04/11/2016  PCP: [name], MD  Patient coming from: Home.  Chief Complaint: Rectal bleeding.  HPI: Peter [name] is a 65 y.o. male with nonischemic cardiomyopathy with last EF measured in January 2015 was 42% with grade 1 diastolic dysfunction, hypothyroidism, hypertension presents to the ER because of rectal bleeding. Patient started noticing bleeding from the rectum since yesterday afternoon. Has had multiple episodes of painless bleeding. Denies any nausea vomiting. Patient's last colonoscopy was in 2015 which showed moderate diverticulosis of the colon and also had a polyp. Patient uses aspirin but denies using any NSAIDs. Patient had at least 2 episodes of frank rectal bleeding in the ER. Patient is being admitted for acute GI bleed most likely diverticular in origin. Patient is presently hemodynamically stable.   ED Course: Patient was started on fluids and type and screen done.  Review of Systems: As per HPI, rest all negative.   Past Medical History  Diagnosis Date  . Allergy   . Asthma   . Hypertension   . Substance abuse     not in many years  . Acute combined systolic and diastolic heart failure ([place]) 03/11/2013  . Shortness of breath     with asthma  . Cardiomyopathy ([place]) 2014    "viral"  . Complication of anesthesia     problems urinating after anesthesia  . Wears glasses   . Wears partial dentures     top  . Back pain   . MRSA (methicillin resistant Staphylococcus aureus) infection     lumbar-2015  . Fatigue due to treatment   . Hypersomnia due to drug ([place])   . Obstructive sleep apnea syndrome 09/22/2014    uses cpap  . Hypothyroidism     had Graves' disease - thyroid ablation  . Arthritis     right knee    Past Surgical History  Procedure Laterality Date  . Cardiac catheterization  2014  . Anterior cervical decomp/discectomy fusion N/A 07/30/2013    Procedure:  CERVICAL FIVE,CERVICAL SIX CORPECTOMY,ANTERIOR ARTHRODESIS,PEEK INTERBODY GRAFT CERVICAL FOUR-CERVICAL SEVEN,ANTERIOR INSTRUMENTATION;  Surgeon: Winfield Cunas, MD;  Location: [place];  Service: Neurosurgery;  Laterality: N/A;  . Lumbar wound debridement N/A 11/07/2013    Procedure: LUMBAR WOUND DEBRIDEMENT;  Surgeon: Winfield Cunas, MD;  Location: [facility];  Service: Neurosurgery;  Laterality: N/A;  . Tonsillectomy  1958  . Back surgery  1/15    lumb fusion  . Colonoscopy  2003  . Carpal tunnel release Right 03/23/2014    Procedure: RIGHT CARPAL TUNNEL RELEASE;  Surgeon: Tennis Must, MD;  Location: [facility];  Service: Orthopedics;  Laterality: Right;  . Left and right heart catheterization with coronary angiogram N/A 03/11/2013    Procedure: LEFT AND RIGHT HEART CATHETERIZATION WITH CORONARY ANGIOGRAM;  Surgeon: Laverda Page, MD;  Location: [facility] C[facility];  Service: Cardiovascular;  Laterality: N/A;  . Anterior cervical decomp/discectomy fusion N/A 04/16/2015    Procedure: CERVICAL THREE-FOUR ANTERIOR CERVICAL DECOMPRESSION/DISCECTOMY FUSION 1 LEVEL;  Surgeon: Ashok Pall, MD;  Location: [place] [facility];  Service: Neurosurgery;  Laterality: N/A;  C34 anterior cervical decompression with fusion plating and bonegraft     reports that he quit smoking about 20 years ago. He has never used smokeless tobacco. He reports that he does not drink alcohol or use illicit drugs.  Allergies  Allergen Reactions  . Keflex [Cephalexin] Anaphylaxis    Family History  Problem Relation Age of Onset  . Colon cancer Neg Hx     Prior to Admission medications   Medication Sig Start Date End Date Taking? Authorizing Provider  albuterol (PROVENTIL) (2.5 MG/3ML) 0.083% nebulizer solution Take 3 mLs (2.5 mg total) by nebulization every 6 (six) hours as needed for wheezing or shortness of breath (Albuterol Nebs used for Inhaler per [place]). 04/18/15  Yes Karie Chimera, MD    docusate sodium (COLACE) 100 MG capsule Take 100 mg by mouth 2 (two) times daily.   Yes Historical Provider, MD  furosemide (LASIX) 20 MG tablet Take 1 tablet (20 mg total) by mouth daily. Patient taking differently: Take 20 mg by mouth daily. Instructed to take as needed 08/25/13  Yes Shon Baton, MD  isosorbide-hydrALAZINE (BIDIL) 20-37.5 MG per tablet Take 1 tablet by mouth 2 (two) times daily. 08/14/13  Yes Shon Baton, MD  levothyroxine (SYNTHROID, LEVOTHROID) 200 MCG tablet Take 200 mcg by mouth daily before breakfast.   Yes Historical Provider, MD  metoprolol succinate (TOPROL-XL) 100 MG 24 hr tablet Take 100 mg by mouth daily. 04/03/16  Yes Historical Provider, MD  Multiple Vitamin (MULTIVITAMIN WITH MINERALS) TABS Take 1 tablet by mouth daily.   Yes Historical Provider, MD  Omega-3 Fatty Acids (FISH OIL) 1000 MG CAPS Take 3,000 mg by mouth daily.   Yes Historical Provider, MD  oxyCODONE (ROXICODONE) 5 MG immediate release tablet Take 1 tablet (5 mg total) by mouth every 4 (four) hours as needed for severe pain. 04/18/15  Yes Karie Chimera, MD  albuterol (PROVENTIL HFA;VENTOLIN HFA) 108 (90 BASE) MCG/ACT inhaler Inhale 2 puffs into the lungs every 6 (six) hours as needed for wheezing or shortness of breath. Patient not taking: Reported on 04/11/2016 04/18/15   Karie Chimera, MD    Physical Exam: Filed Vitals:   04/10/16 2020 04/11/16 0045 04/11/16 0130 04/11/16 0200  BP: 140/99 144/92 128/95 130/94  Pulse: 78 69 77 81  Temp: 98 F (36.7 C)     TempSrc: Oral     Resp: 20 23 12 13   Height: 6\' 2"  (1.88 m)     Weight: 225 lb (102.059 kg)     SpO2: 97% 99% 99% 100%      Constitutional: Not in distress. Filed Vitals:   04/10/16 2020 04/11/16 0045 04/11/16 0130 04/11/16 0200  BP: 140/99 144/92 128/95 130/94  Pulse: 78 69 77 81  Temp: 98 F (36.7 C)     TempSrc: Oral     Resp: 20 23 12 13   Height: 6\' 2"  (1.88 m)     Weight: 225 lb (102.059 kg)     SpO2: 97% 99% 99% 100%   Eyes:  Anicteric no pallor. ENMT: No discharge from the ears eyes nose and mouth. Neck: No mass felt. No JVD appreciated. Respiratory: No rhonchi or crepitations. Cardiovascular: S1 and S2 heard. Abdomen: Soft nontender bowel sounds present. Musculoskeletal: No edema. Skin: No rash. Neurologic: Alert awake oriented to time place and person. Moves all extremities. Psychiatric: Appears normal.   Labs on Admission: I have personally reviewed following labs and imaging studies  CBC:  Recent Labs Lab 04/10/16 2033  WBC 5.9  HGB 12.4*  HCT 37.3*  MCV 85.0  PLT [id number]   Basic Metabolic Panel:  Recent Labs Lab 04/10/16 2033  NA 142  K 4.3  CL 109  CO2 27  GLUCOSE 105*  BUN 14  CREATININE 1.05  CALCIUM 9.3   GFR: Estimated Creatinine Clearance: 90.7  mL/min (by C-G formula based on Cr of 1.05). Liver Function Tests:  Recent Labs Lab 04/10/16 2033  AST 18  ALT 14*  ALKPHOS 77  BILITOT 0.6  PROT 6.8  ALBUMIN 3.8   No results for input(s): LIPASE, AMYLASE in the last 168 hours. No results for input(s): AMMONIA in the last 168 hours. Coagulation Profile: No results for input(s): INR, PROTIME in the last 168 hours. Cardiac Enzymes: No results for input(s): CKTOTAL, CKMB, CKMBINDEX, TROPONINI in the last 168 hours. BNP (last 3 results) No results for input(s): PROBNP in the last 8760 hours. HbA1C: No results for input(s): HGBA1C in the last 72 hours. CBG: No results for input(s): GLUCAP in the last 168 hours. Lipid Profile: No results for input(s): CHOL, HDL, LDLCALC, TRIG, CHOLHDL, LDLDIRECT in the last 72 hours. Thyroid Function Tests: No results for input(s): TSH, T4TOTAL, FREET4, T3FREE, THYROIDAB in the last 72 hours. Anemia Panel: No results for input(s): VITAMINB12, FOLATE, FERRITIN, TIBC, IRON, RETICCTPCT in the last 72 hours. Urine analysis:    Component Value Date/Time   COLORURINE YELLOW 12/29/2013 2100   APPEARANCEUR CLEAR 12/29/2013 2100   LABSPEC 1.022  12/29/2013 2100   PHURINE 8.0 12/29/2013 2100   GLUCOSEU NEGATIVE 12/29/2013 2100   HGBUR NEGATIVE 12/29/2013 2100   [place] NEGATIVE 12/29/2013 2100   [place] NEGATIVE 12/29/2013 2100   PROTEINUR NEGATIVE 12/29/2013 2100   UROBILINOGEN 0.2 12/29/2013 2100   NITRITE NEGATIVE 12/29/2013 2100   LEUKOCYTESUR NEGATIVE 12/29/2013 2100   Sepsis Labs: @LABRCNTIP (procalcitonin:4,lacticidven:4) )No results found for this or any previous visit (from the past 240 hour(s)).   Radiological Exams on Admission: No results found.   Assessment/Plan Principal Problem:   Acute GI bleeding Active Problems:   HTN (hypertension)   Hypothyroidism   Acute blood loss anemia    1. Acute GI bleeding - likely diverticular in origin. At this time I have placed patient nothing by mouth and on IV fluids and follow CBC every 4 hourly. Hold antihypertensives and discontinue aspirin for now. Consult gastroenterology in a.m. or sooner if patient continues to bleed or becomes hemodynamically unstable. 2. Acute blood loss anemia secondary to GI bleed - closely follow CBC. 3. History of nonischemic cardiomyopathy with last EF measured was in January 2015 was 42% with grade 1 diastolic dysfunction - holding of Lasix secondary to #1. Closely monitor respiratory status. 4. Hypertension - holding all antihypertensives secondary to GI bleed. Patient states his colon was changed to metoprolol last week. 5. Hypothyroidism on levothyroxine.  Addendum - at around 4 AM patient had another bout of rectal bleeding following which patient became hypotensive with systolic blood pressure in the 80s. I have ordered 2 L of fluid bolus. I have discussed with on-call gastroenterologist Dr. Ardis Hughs who will be seeing patient in consult. Patient also will be receiving 2 units of packed red blood cell transfusion. Since patient also had some melanotic stools this time, gastroenterologist has advised to start patient on PPI. Reviewing  patient after 20 minutes patient blood pressure did improve and is presently [id number] systolic.   DVT prophylaxis: SCDs. Code Status: Full code.  Family Communication: No family at the bedside.  Disposition Plan: Home.  Consults called: Gastroenterologist Dr. Ardis Hughs.  Admission status: Inpatient. Stepdown. Likely stay 2-3 days.    Rise Patience MD [facility] Pager [phone].  If 7PM-7AM, please contact night-coverage [email or web address] Password [email or web address]  04/11/2016, 3:16 AM

## 2016-04-11 NOTE — Progress Notes (Signed)
Peripherally Inserted Central Catheter/Midline Placement  The IV Nurse has discussed with the patient and/or persons authorized to consent for the patient, the purpose of this procedure and the potential benefits and risks involved with this procedure.  The benefits include less needle sticks, lab draws from the catheter and patient may be discharged home with the catheter.  Risks include, but not limited to, infection, bleeding, blood clot (thrombus formation), and puncture of an artery; nerve damage and irregular heat beat.  Alternatives to this procedure were also discussed.  PICC/Midline Placement Documentation        Peter [name] 04/11/2016, 3:32 PM

## 2016-04-11 NOTE — ED Provider Notes (Signed)
I was called to room as patient had large bloody BM and is diaphoretic and hypotensive He is responsive to voice SBP >90 Blood products ordered chem8 panel ordered He is going to stepdown   Ripley Fraise, MD 04/11/16 0407

## 2016-04-11 NOTE — ED Notes (Signed)
Admitting MD at bedside.

## 2016-04-11 NOTE — Progress Notes (Signed)
Patient admitted overnight to the SDU.  Please see H&P.  Here for lower GI bleed.  GI ([facility]) is following.  CT scan with GI bleed protocol ordered.  CBC pending  Peter Bear DO

## 2016-04-11 NOTE — ED Provider Notes (Signed)
CSN: [id number]     Arrival date & time 04/10/16  2014 History   First MD Initiated Contact with Patient 04/11/16 0043     Chief Complaint  Patient presents with  . Rectal Bleeding  . Hematochezia     (Consider location/radiation/quality/duration/timing/severity/associated sxs/prior Treatment) HPI Comments: Patient with a history of asthma, HTN, heart failure, hypothyroidism presents with rectal bleeding that started today. Since around 2:00 pm he has had 6 bowel movements consisting of large quantities of bright red blood and clots. He reports some stool in the first 2 or 3 movements but cannot say whether they were melanotic. No abdominal pain, lightheadedness, near syncope, nausea or vomiting. He denies history of rectal bleeding. Last colonoscopy was last year ([facility]) and he was found to have polyps, per patient. He is not anticoagulated.   Patient is a 65 y.o. male presenting with hematochezia. The history is provided by the patient. No language interpreter was used.  Rectal Bleeding Quality:  Bright red Amount:  Copious Duration:  1 day Progression:  Unchanged Chronicity:  New Similar prior episodes: no   Associated symptoms: no abdominal pain, no fever, no light-headedness and no vomiting     Past Medical History  Diagnosis Date  . Allergy   . Asthma   . Hypertension   . Substance abuse     not in many years  . Acute combined systolic and diastolic heart failure ([place]) 03/11/2013  . Shortness of breath     with asthma  . Cardiomyopathy ([place]) 2014    "viral"  . Complication of anesthesia     problems urinating after anesthesia  . Wears glasses   . Wears partial dentures     top  . Back pain   . MRSA (methicillin resistant Staphylococcus aureus) infection     lumbar-2015  . Fatigue due to treatment   . Hypersomnia due to drug ([place])   . Obstructive sleep apnea syndrome 09/22/2014    uses cpap  . Hypothyroidism     had Graves' disease - thyroid ablation  .  Arthritis     right knee   Past Surgical History  Procedure Laterality Date  . Cardiac catheterization  2014  . Anterior cervical decomp/discectomy fusion N/A 07/30/2013    Procedure: CERVICAL FIVE,CERVICAL SIX CORPECTOMY,ANTERIOR ARTHRODESIS,PEEK INTERBODY GRAFT CERVICAL FOUR-CERVICAL SEVEN,ANTERIOR INSTRUMENTATION;  Surgeon: Winfield Cunas, MD;  Location: [facility];  Service: Neurosurgery;  Laterality: N/A;  . Lumbar wound debridement N/A 11/07/2013    Procedure: LUMBAR WOUND DEBRIDEMENT;  Surgeon: Winfield Cunas, MD;  Location: [place] [facility];  Service: Neurosurgery;  Laterality: N/A;  . Tonsillectomy  1958  . Back surgery  1/15    lumb fusion  . Colonoscopy  2003  . Carpal tunnel release Right 03/23/2014    Procedure: RIGHT CARPAL TUNNEL RELEASE;  Surgeon: Tennis Must, MD;  Location: [facility];  Service: Orthopedics;  Laterality: Right;  . Left and right heart catheterization with coronary angiogram N/A 03/11/2013    Procedure: LEFT AND RIGHT HEART CATHETERIZATION WITH CORONARY ANGIOGRAM;  Surgeon: Laverda Page, MD;  Location: [facility] CATH LAB;  Service: Cardiovascular;  Laterality: N/A;  . Anterior cervical decomp/discectomy fusion N/A 04/16/2015    Procedure: CERVICAL THREE-FOUR ANTERIOR CERVICAL DECOMPRESSION/DISCECTOMY FUSION 1 LEVEL;  Surgeon: Ashok Pall, MD;  Location: [place] [facility];  Service: Neurosurgery;  Laterality: N/A;  C34 anterior cervical decompression with fusion plating and bonegraft   Family History  Problem Relation Age of Onset  .  Colon cancer Neg Hx    Social History  Substance Use Topics  . Smoking status: Former Smoker -- 0 years    Quit date: 08/14/1995  . Smokeless tobacco: Never Used  . Alcohol Use: No    Review of Systems  Constitutional: Negative for fever and chills.  Respiratory: Negative.  Negative for shortness of breath.   Cardiovascular: Negative.  Negative for chest pain and palpitations.  Gastrointestinal: Positive for blood  in stool and hematochezia. Negative for nausea, vomiting, abdominal pain and rectal pain.  Genitourinary: Negative.   Musculoskeletal: Negative.  Negative for myalgias.  Skin: Negative.  Negative for pallor.  Neurological: Negative.  Negative for syncope, weakness and light-headedness.      Allergies  Keflex  Home Medications   Prior to Admission medications   Medication Sig Start Date End Date Taking? Authorizing Provider  albuterol (PROVENTIL) (2.5 MG/3ML) 0.083% nebulizer solution Take 3 mLs (2.5 mg total) by nebulization every 6 (six) hours as needed for wheezing or shortness of breath (Albuterol Nebs used for Inhaler per [place]). 04/18/15  Yes Karie Chimera, MD  docusate sodium (COLACE) 100 MG capsule Take 100 mg by mouth 2 (two) times daily.   Yes Historical Provider, MD  furosemide (LASIX) 20 MG tablet Take 1 tablet (20 mg total) by mouth daily. Patient taking differently: Take 20 mg by mouth daily. Instructed to take as needed 08/25/13  Yes Shon Baton, MD  isosorbide-hydrALAZINE (BIDIL) 20-37.5 MG per tablet Take 1 tablet by mouth 2 (two) times daily. 08/14/13  Yes Shon Baton, MD  levothyroxine (SYNTHROID, LEVOTHROID) 200 MCG tablet Take 200 mcg by mouth daily before breakfast.   Yes Historical Provider, MD  metoprolol succinate (TOPROL-XL) 100 MG 24 hr tablet Take 100 mg by mouth daily. 04/03/16  Yes Historical Provider, MD  Multiple Vitamin (MULTIVITAMIN WITH MINERALS) TABS Take 1 tablet by mouth daily.   Yes Historical Provider, MD  Omega-3 Fatty Acids (FISH OIL) 1000 MG CAPS Take 3,000 mg by mouth daily.   Yes Historical Provider, MD  oxyCODONE (ROXICODONE) 5 MG immediate release tablet Take 1 tablet (5 mg total) by mouth every 4 (four) hours as needed for severe pain. 04/18/15  Yes Karie Chimera, MD  albuterol (PROVENTIL HFA;VENTOLIN HFA) 108 (90 BASE) MCG/ACT inhaler Inhale 2 puffs into the lungs every 6 (six) hours as needed for wheezing or shortness of  breath. Patient not taking: Reported on 04/11/2016 04/18/15   Karie Chimera, MD   BP 130/94 mmHg  Pulse 81  Temp(Src) 98 F (36.7 C) (Oral)  Resp 13  Ht 6\' 2"  (1.88 m)  Wt 102.059 kg  BMI 28.88 kg/m2  SpO2 100% Physical Exam  Constitutional: He is oriented to person, place, and time. He appears well-developed and well-nourished.  HENT:  Head: Normocephalic.  Neck: Normal range of motion. Neck supple.  Cardiovascular: Normal rate and regular rhythm.   Pulmonary/Chest: Effort normal and breath sounds normal.  Abdominal: Soft. Bowel sounds are normal. He exhibits no distension. There is no tenderness. There is no rebound and no guarding.  Genitourinary:  Large volume bowel movement consisting of copious amount of blood with clotting. No stool visualized. No guaiac performed.   Musculoskeletal: Normal range of motion.  Neurological: He is alert and oriented to person, place, and time.  Skin: Skin is warm and dry. No rash noted.  Psychiatric: He has a normal mood and affect.    ED Course  Procedures (including critical care time) Labs Review Labs  Reviewed  COMPREHENSIVE METABOLIC PANEL - Abnormal; Notable for the following:    Glucose, Bld 105 (*)    ALT 14 (*)    All other components within normal limits  CBC - Abnormal; Notable for the following:    Hemoglobin 12.4 (*)    HCT 37.3 (*)    All other components within normal limits  HEMOGLOBIN AND HEMATOCRIT, BLOOD  APTT  PROTIME-INR  TYPE AND SCREEN    Imaging Review No results found. I have personally reviewed and evaluated these images and lab results as part of my medical decision-making.   EKG Interpretation None      MDM   Final diagnoses:  Lower GI bleed    Patient presents with multiple bowel movements consisting of large quantity of blood, minimal stool. No other symptoms - no weakness, syncope, dizziness, pain. Normal hemoglobin, stable VS. Patient will need to be admitted for serial hgb, GI consult.  Discussed with Dr. Hal Hope with [facility] who accepts the patient for admission.    Charlann Lange, PA-C 04/11/16 0241  Ripley Fraise, MD 04/11/16 [id number]  Ripley Fraise, MD 04/11/16 [phone]

## 2016-04-11 NOTE — ED Notes (Signed)
Pt diaphoretic, passing more stools, BP 83/60. Dr Christy Gentles notified and at bedside, text page sent to Dr. Hal Hope

## 2016-04-12 DIAGNOSIS — D62 Acute posthemorrhagic anemia: Secondary | ICD-10-CM

## 2016-04-12 DIAGNOSIS — I10 Essential (primary) hypertension: Secondary | ICD-10-CM

## 2016-04-12 DIAGNOSIS — E039 Hypothyroidism, unspecified: Secondary | ICD-10-CM

## 2016-04-12 LAB — TYPE AND SCREEN
ABO/RH(D): O POS
Antibody Screen: NEGATIVE
Unit division: 0
Unit division: 0

## 2016-04-12 LAB — CBC
HEMATOCRIT: 28.4 % — AB (ref 39.0–52.0)
HEMOGLOBIN: 9.5 g/dL — AB (ref 13.0–17.0)
MCH: 27.6 pg (ref 26.0–34.0)
MCHC: 33.5 g/dL (ref 30.0–36.0)
MCV: 82.6 fL (ref 78.0–100.0)
Platelets: 171 10*3/uL (ref 150–400)
RBC: 3.44 MIL/uL — ABNORMAL LOW (ref 4.22–5.81)
RDW: 13.9 % (ref 11.5–15.5)
WBC: 9 10*3/uL (ref 4.0–10.5)

## 2016-04-12 LAB — GLUCOSE, CAPILLARY
GLUCOSE-CAPILLARY: 104 mg/dL — AB (ref 65–99)
Glucose-Capillary: 92 mg/dL (ref 65–99)

## 2016-04-12 MED ORDER — PANTOPRAZOLE SODIUM 40 MG PO TBEC
40.0000 mg | DELAYED_RELEASE_TABLET | Freq: Every day | ORAL | Status: DC
Start: 2016-04-12 — End: 2016-04-13
  Administered 2016-04-12 – 2016-04-13 (×2): 40 mg via ORAL
  Filled 2016-04-12: qty 1

## 2016-04-12 MED ORDER — HYDRALAZINE HCL 20 MG/ML IJ SOLN
10.0000 mg | Freq: Once | INTRAMUSCULAR | Status: AC
  Administered 2016-04-12: 10 mg via INTRAVENOUS
  Filled 2016-04-12: qty 1

## 2016-04-12 MED ORDER — HYDRALAZINE HCL 20 MG/ML IJ SOLN
10.0000 mg | Freq: Four times a day (QID) | INTRAMUSCULAR | Status: DC | PRN
  Administered 2016-04-12: 10 mg via INTRAVENOUS
  Filled 2016-04-12: qty 1

## 2016-04-12 MED ORDER — LEVOTHYROXINE SODIUM 200 MCG PO TABS
200.0000 ug | ORAL_TABLET | Freq: Every day | ORAL | Status: DC
  Administered 2016-04-13: 200 ug via ORAL
  Filled 2016-04-12: qty 1

## 2016-04-12 MED ORDER — LEVALBUTEROL HCL 1.25 MG/0.5ML IN NEBU
1.2500 mg | INHALATION_SOLUTION | Freq: Once | RESPIRATORY_TRACT | Status: AC
  Administered 2016-04-12: 1.25 mg via RESPIRATORY_TRACT
  Filled 2016-04-12 (×2): qty 0.5

## 2016-04-12 NOTE — Progress Notes (Signed)
Patient's sstolic blood pressure [id number] .Dr Marisa Severin paged . Order recived for prn hydralazine .given per order

## 2016-04-12 NOTE — Progress Notes (Signed)
Paged [facility] regarding patient complaining of tightness in chest. States that this is asthma related and that he gives himself half a treatment at night when he gets up to use the bathroom and it relieves his symptoms. I put patient on [place] 2L and he states that it has helped some but the feeling is still there. Waiting for [facility]'s page. Peter [name] E, [place] 04/12/2016 3:18 AM

## 2016-04-12 NOTE — Progress Notes (Signed)
Progress Note    Peter [name]  [id number] DOB: [birth year]  DOA: 04/11/2016 PCP: [name], MD    Brief Narrative:   Peter [name] is an 65 y.o. male with nonischemic cardiomyopathy with last EF measured in January 2015 was 42% with grade 1 diastolic dysfunction, hypothyroidism, hypertension presents to the ER because of rectal bleeding.   Assessment/Plan:   Principal Problem:   Acute GI bleeding Active Problems:   HTN (hypertension)   Hypothyroidism   Acute blood loss anemia  Gi bleed probably diverticular bleeding. Currently no bloody bowel movements.  Hemoglobin is 9.5. Received 2 units of prbc transfusion.  Asypmtomatic. Started on clear liquid diet today. Advance diet as tolerated.  Gi consulted and recommendations given.  Repeat another CBC in am.    Acute blood loss anemia: baseline hemoglobin at 11.  Today is 9.5 . Continue to monitor.  Received 2 units of prbc transfusion.    Hypertension: sub optimal.  Resume home meds and start prn hydralazine.   Hypothyroidism: Resume synthroid.    Family Communication/Anticipated D/C date and plan/Code Status   DVT prophylaxis:SCD'S Code Status: Full Code.  Family Communication: none at bedside Disposition Plan: transfer to telemetry.    Medical Consultants:   Gastroenterology.  Procedures:   none Anti-Infectives:   Anti-infectives    None      Subjective:    Peter [name] denies any new complaints. Wants to know how soon he can go home. No BM so far.   Objective:    Filed Vitals:   04/12/16 1322 04/12/16 1400 04/12/16 1500 04/12/16 1519  BP: 170/124 176/90 160/76   Pulse: 71 74 75   Temp:    98.7 F (37.1 C)  TempSrc:    Oral  Resp: 12 25 15    Height:      Weight:      SpO2: 100% 98% 100%     Intake/Output Summary (Last 24 hours) at 04/12/16 1541 Last data filed at 04/12/16 1400  Gross per 24 hour  Intake   3040 ml  Output   2525 ml  Net    515 ml   Filed  Weights   04/10/16 2020 04/11/16 0600  Weight: 102.059 kg (225 lb) 99.8 kg (220 lb 0.3 oz)    Exam: General exam: Appears calm and comfortable.  Respiratory system: Clear to auscultation. Respiratory effort normal. Cardiovascular system: S1 & S2 heard, RRR. No JVD,  rubs, gallops or clicks. No murmurs. Gastrointestinal system: Abdomen is nondistended, soft and nontender. No organomegaly or masses felt. Normal bowel sounds heard. Central nervous system: Alert and oriented. No focal neurological deficits. Extremities: No clubbing, edema, or cyanosis. Skin: No rashes, lesions or ulcers Psychiatry: Judgement and insight appear normal. Mood & affect appropriate.   Data Reviewed:   I have personally reviewed following labs and imaging studies:  Labs: Basic Metabolic Panel:  Recent Labs Lab 04/10/16 2033 04/11/16 0409 04/11/16 0416  NA 142 140 142  K 4.3 3.5 3.4*  CL 109 107 105  CO2 27 26  --   GLUCOSE 105* 155* 153*  BUN 14 17 20   CREATININE 1.05 1.03 1.00  CALCIUM 9.3 8.9  --    GFR Estimated Creatinine Clearance: 94.2 mL/min (by C-G formula based on Cr of 1). Liver Function Tests:  Recent Labs Lab 04/10/16 2033 04/11/16 0409  AST 18 16  ALT 14* 14*  ALKPHOS 77 67  BILITOT 0.6 0.7  PROT 6.8 6.0*  ALBUMIN 3.8 3.4*  No results for input(s): LIPASE, AMYLASE in the last 168 hours. No results for input(s): AMMONIA in the last 168 hours. Coagulation profile  Recent Labs Lab 04/11/16 0409  INR 1.03    CBC:  Recent Labs Lab 04/10/16 2033 04/11/16 0409 04/11/16 0416 04/11/16 1603 04/12/16 0002  WBC 5.9 7.3  --  8.5 9.0  HGB 12.4* 10.8* 11.6* 10.3* 9.5*  HCT 37.3* 33.5* 34.0* 31.4* 28.4*  MCV 85.0 85.0  --  83.1 82.6  PLT 259 250  --  185 171   Cardiac Enzymes: No results for input(s): CKTOTAL, CKMB, CKMBINDEX, TROPONINI in the last 168 hours. BNP (last 3 results) No results for input(s): PROBNP in the last 8760 hours. CBG:  Recent Labs Lab  04/11/16 0550 04/11/16 0724 04/11/16 1557 04/11/16 2202 04/12/16 0723  GLUCAP 93 91 72 97 92   D-Dimer: No results for input(s): DDIMER in the last 72 hours. Hgb A1c: No results for input(s): HGBA1C in the last 72 hours. Lipid Profile: No results for input(s): CHOL, HDL, LDLCALC, TRIG, CHOLHDL, LDLDIRECT in the last 72 hours. Thyroid function studies: No results for input(s): TSH, T4TOTAL, T3FREE, THYROIDAB in the last 72 hours.  Invalid input(s): FREET3 Anemia work up: No results for input(s): VITAMINB12, FOLATE, FERRITIN, TIBC, IRON, RETICCTPCT in the last 72 hours. Sepsis Labs:  Recent Labs Lab 04/10/16 2033 04/11/16 0409 04/11/16 0428 04/11/16 1603 04/12/16 0002  WBC 5.9 7.3  --  8.5 9.0  LATICACIDVEN  --   --  1.30  --   --    Urine analysis:    Component Value Date/Time   COLORURINE YELLOW 12/29/2013 2100   APPEARANCEUR CLEAR 12/29/2013 2100   LABSPEC 1.022 12/29/2013 2100   PHURINE 8.0 12/29/2013 2100   GLUCOSEU NEGATIVE 12/29/2013 2100   HGBUR NEGATIVE 12/29/2013 2100   [place] NEGATIVE 12/29/2013 2100   [place] NEGATIVE 12/29/2013 2100   PROTEINUR NEGATIVE 12/29/2013 2100   UROBILINOGEN 0.2 12/29/2013 2100   NITRITE NEGATIVE 12/29/2013 2100   LEUKOCYTESUR NEGATIVE 12/29/2013 2100   Microbiology Recent Results (from the past 240 hour(s))  MRSA PCR Screening     Status: None   Collection Time: 04/11/16  5:54 AM  Result Value Ref Range Status   MRSA by PCR NEGATIVE NEGATIVE Final    Comment:        The GeneXpert MRSA Assay (FDA approved for NASAL specimens only), is one component of a comprehensive MRSA colonization surveillance program. It is not intended to diagnose MRSA infection nor to guide or monitor treatment for MRSA infections.     Radiology: Ct Angio Abdomen Pelvis  W &/or Wo Contrast  04/11/2016  CLINICAL DATA:  65 year old male with GI bleed and diarrhea since yesterday EXAM: CTA ABDOMEN AND PELVIS wITHOUT AND WITH CONTRAST  TECHNIQUE: Multidetector CT imaging of the abdomen and pelvis was performed using the standard protocol during bolus administration of intravenous contrast. Multiplanar reconstructed images and MIPs were obtained and reviewed to evaluate the vascular anatomy. CONTRAST:  100 mL Isovue-300 COMPARISON:  CT abdomen/ pelvis 12/29/2013 FINDINGS: VASCULAR Aorta: Normal caliber abdominal aorta. Minimal fibro fatty atherosclerotic plaque. No evidence of dissection or penetrating ulcer. Celiac: Widely patent and unremarkable. The lateral segmental branch of the left hepatic artery is replaced to the left gastric artery. No evidence of dissection, stenosis or aneurysm. SMA: Widely patent and unremarkable. Renals: Single right renal artery is patent. On the left, the dominant renal artery demonstrates fibro fatty atherosclerotic plaque resolving in mild -moderate narrowing. There is  a small accessory renal artery supplying the interpolar region. IMA: Patent and unremarkable. Inflow: Mild atherosclerotic plaque without focal stenosis. Proximal Outflow: Relatively disease free and widely patent. Veins: No focal venous abnormality identified. NON-VASCULAR Lower Chest: Mild dependent atelectasis in the right lower lobe with elevation of the posterior aspect of the diaphragm. Similarly, there is mild subsegmental atelectasis in the left lower lobe. No suspicious pulmonary nodule or mass. The heart is normal in size. No pericardial effusion. Unremarkable distal thoracic esophagus. Abdomen: Unremarkable CT appearance of the stomach, duodenum, spleen, adrenal glands and pancreas. Normal hepatic contour and morphology. Stable small cyst in the periphery of the right hepatic lobe. No solid lesion identified. Gallbladder is unremarkable. No intra or extrahepatic biliary ductal dilatation. Normal renal parenchymal enhancement. No hydronephrosis, nephrolithiasis or enhancing mass. Circumscribed low-attenuation lesions bilaterally remain  consistent with simple cysts although many of the lesions are too small for accurate characterization. However, the largest 2.7 cm cyst in the upper pole of the left kidney is essentially unchanged. Extensive colonic diverticulosis. There is faint inflammatory interstitial stranding in the pericolonic fat of the sigmoid colon the left lower quadrant concerning for early diverticulitis. No evidence of perforation or fluid collection. Normal appendix in the right lower quadrant. No evidence of obstruction. Pelvis: Prostatomegaly with indentation of the bladder base. No free fluid or suspicious adenopathy. Bones/Soft Tissues: No acute fracture or aggressive appearing lytic or blastic osseous lesion. Surgical changes of prior L4-L5 posterior lumbar interbody fusion with interbody graft. Degenerative disc disease is also present at L5-S1. Review of the MIP images confirms the above findings. IMPRESSION: 1. No evidence of active contrast extravasation or bleeding at this time. 2. CT findings are concerning for early uncomplicated sigmoid diverticulitis. 3.  Aortic Atherosclerosis (ICD10-170.0) 4. Mild -moderate stenosis of the origin of the left main renal artery secondary to fibro fatty atherosclerotic plaque. 5. Stable hepatic and bilateral renal cysts. 6. Prostatomegaly with indentation of the bladder base. 7. Lower lumbar degenerative disc disease status post L4-L5 posterior lumbar interbody fusion with interbody graft. Stable grade 1 anterolisthesis. Signed, Criselda Peaches, MD [facility] Radiology Electronically Signed   By: Jacqulynn Cadet M.D.   On: 04/11/2016 17:45    Medications:   . sodium chloride  10 mL/hr Intravenous Once  . albuterol  2.5 mg Nebulization TID  . metoprolol tartrate  50 mg Oral BID  . pantoprazole  40 mg Oral Q0600  . sodium chloride flush  10-40 mL Intracatheter Q12H   Continuous Infusions:   Time spent: 25 minutes.     LOS: 1 day   [facility] Pager [phone]   *Please refer to [email or web address], password [email or web address] to get updated schedule on who will round on this patient, as hospitalists switch teams weekly. If 7PM-7AM, please contact night-coverage at [email or web address], password [email or web address] for any overnight needs.  04/12/2016, 3:41 PM

## 2016-04-12 NOTE — Progress Notes (Signed)
[facility] Progress Note    Since last GI note: CT bleeding scan yesterday afternoon, he needed PICC line placement for this.  Report below.  He's received 2 units of blood total.  He had bleeding yesterday AM but NONE since then.  He has no abdominal pains.  Objective: Vital signs in last 24 hours: Temp:  [97.4 F (36.3 C)-98.5 F (36.9 C)] 98.4 F (36.9 C) (07/19 0027) Pulse Rate:  [65-106] 92 (07/19 0714) Resp:  [11-29] 14 (07/19 0714) BP: (141-205)/(68-117) 189/76 mmHg (07/19 0714) SpO2:  [96 %-100 %] 100 % (07/19 0714) Last BM Date: 04/11/16 General: alert and oriented times 3 Heart: regular rate and rythm Abdomen: soft, non-tender, non-distended, normal bowel sounds   Lab Results:  Recent Labs  04/11/16 0409 04/11/16 0416 04/11/16 1603 04/12/16 0002  WBC 7.3  --  8.5 9.0  HGB 10.8* 11.6* 10.3* 9.5*  PLT 250  --  185 171  MCV 85.0  --  83.1 82.6    Recent Labs  04/10/16 2033 04/11/16 0409 04/11/16 0416  NA 142 140 142  K 4.3 3.5 3.4*  CL 109 107 105  CO2 27 26  --   GLUCOSE 105* 155* 153*  BUN 14 17 20   CREATININE 1.05 1.03 1.00  CALCIUM 9.3 8.9  --     Recent Labs  04/10/16 2033 04/11/16 0409  PROT 6.8 6.0*  ALBUMIN 3.8 3.4*  AST 18 16  ALT 14* 14*  ALKPHOS 77 67  BILITOT 0.6 0.7    Recent Labs  04/11/16 0409  INR 1.03     Studies/Results: Ct Angio Abdomen Pelvis  W &/or Wo Contrast  04/11/2016  CLINICAL DATA:  65 year old male with GI bleed and diarrhea since yesterday EXAM: CTA ABDOMEN AND PELVIS wITHOUT AND WITH CONTRAST TECHNIQUE: Multidetector CT imaging of the abdomen and pelvis was performed using the standard protocol during bolus administration of intravenous contrast. Multiplanar reconstructed images and MIPs were obtained and reviewed to evaluate the vascular anatomy. CONTRAST:  100 mL Isovue-300 COMPARISON:  CT abdomen/ pelvis 12/29/2013 FINDINGS: VASCULAR Aorta: Normal caliber abdominal aorta. Minimal fibro  fatty atherosclerotic plaque. No evidence of dissection or penetrating ulcer. Celiac: Widely patent and unremarkable. The lateral segmental branch of the left hepatic artery is replaced to the left gastric artery. No evidence of dissection, stenosis or aneurysm. SMA: Widely patent and unremarkable. Renals: Single right renal artery is patent. On the left, the dominant renal artery demonstrates fibro fatty atherosclerotic plaque resolving in mild -moderate narrowing. There is a small accessory renal artery supplying the interpolar region. IMA: Patent and unremarkable. Inflow: Mild atherosclerotic plaque without focal stenosis. Proximal Outflow: Relatively disease free and widely patent. Veins: No focal venous abnormality identified. NON-VASCULAR Lower Chest: Mild dependent atelectasis in the right lower lobe with elevation of the posterior aspect of the diaphragm. Similarly, there is mild subsegmental atelectasis in the left lower lobe. No suspicious pulmonary nodule or mass. The heart is normal in size. No pericardial effusion. Unremarkable distal thoracic esophagus. Abdomen: Unremarkable CT appearance of the stomach, duodenum, spleen, adrenal glands and pancreas. Normal hepatic contour and morphology. Stable small cyst in the periphery of the right hepatic lobe. No solid lesion identified. Gallbladder is unremarkable. No intra or extrahepatic biliary ductal dilatation. Normal renal parenchymal enhancement. No hydronephrosis, nephrolithiasis or enhancing mass. Circumscribed low-attenuation lesions bilaterally remain consistent with simple cysts although many of the lesions are too small for accurate characterization. However, the largest 2.7 cm cyst in the upper pole  of the left kidney is essentially unchanged. Extensive colonic diverticulosis. There is faint inflammatory interstitial stranding in the pericolonic fat of the sigmoid colon the left lower quadrant concerning for early diverticulitis. No evidence of  perforation or fluid collection. Normal appendix in the right lower quadrant. No evidence of obstruction. Pelvis: Prostatomegaly with indentation of the bladder base. No free fluid or suspicious adenopathy. Bones/Soft Tissues: No acute fracture or aggressive appearing lytic or blastic osseous lesion. Surgical changes of prior L4-L5 posterior lumbar interbody fusion with interbody graft. Degenerative disc disease is also present at L5-S1. Review of the MIP images confirms the above findings. IMPRESSION: 1. No evidence of active contrast extravasation or bleeding at this time. 2. CT findings are concerning for early uncomplicated sigmoid diverticulitis. 3.  Aortic Atherosclerosis (ICD10-170.0) 4. Mild -moderate stenosis of the origin of the left main renal artery secondary to fibro fatty atherosclerotic plaque. 5. Stable hepatic and bilateral renal cysts. 6. Prostatomegaly with indentation of the bladder base. 7. Lower lumbar degenerative disc disease status post L4-L5 posterior lumbar interbody fusion with interbody graft. Stable grade 1 anterolisthesis. Signed, Criselda Peaches, MD [facility] Radiology Electronically Signed   By: Jacqulynn Cadet M.D.   On: 04/11/2016 17:45     Medications: Scheduled Meds: . sodium chloride  10 mL/hr Intravenous Once  . albuterol  2.5 mg Nebulization TID  . metoprolol tartrate  50 mg Oral BID  . pantoprazole (PROTONIX) IV  40 mg Intravenous Q24H  . sodium chloride flush  10-40 mL Intracatheter Q12H   Continuous Infusions:  PRN Meds:.acetaminophen **OR** acetaminophen, albuterol, ondansetron **OR** ondansetron (ZOFRAN) IV, oxyCODONE, sodium chloride flush    Assessment/Plan: 65 y.o. male with lower GI Bleeding, presumed diverticular  The bleeding seems to have stopped. No bloody output in about 24 hours now (since shortly after the CT scan was ordered).  CT scan reports possible early diverticulitis however he  has NO symptoms of diverticulitis and no tenderness even with deep palpation of LLQ and so I'm not going to start him on antibiotics for now.   Will advance diet as tolerated today.  He understands that his next 1-2 BMs will probably be unusual appearing but unless he has overt BRBPR again he is not having rebleeding.  OK to transfer out of ICU later today.   Milus Banister, MD  04/12/2016, 7:24 AM [facility] Pager [phone]

## 2016-04-13 LAB — CBC
HEMATOCRIT: 29.4 % — AB (ref 39.0–52.0)
Hemoglobin: 9.7 g/dL — ABNORMAL LOW (ref 13.0–17.0)
MCH: 27.4 pg (ref 26.0–34.0)
MCHC: 33 g/dL (ref 30.0–36.0)
MCV: 83.1 fL (ref 78.0–100.0)
Platelets: 206 10*3/uL (ref 150–400)
RBC: 3.54 MIL/uL — ABNORMAL LOW (ref 4.22–5.81)
RDW: 13.8 % (ref 11.5–15.5)
WBC: 6.5 10*3/uL (ref 4.0–10.5)

## 2016-04-13 LAB — BASIC METABOLIC PANEL
Anion gap: 4 — ABNORMAL LOW (ref 5–15)
BUN: 7 mg/dL (ref 6–20)
CO2: 26 mmol/L (ref 22–32)
Calcium: 8.6 mg/dL — ABNORMAL LOW (ref 8.9–10.3)
Chloride: 105 mmol/L (ref 101–111)
Creatinine, Ser: 0.76 mg/dL (ref 0.61–1.24)
GFR calc Af Amer: >60 mL/min (ref >60)
GFR calc non Af Amer: >60 mL/min (ref >60)
Glucose, Bld: 94 mg/dL (ref 65–99)
Potassium: 3.4 mmol/L — ABNORMAL LOW (ref 3.5–5.1)
Sodium: 135 mmol/L (ref 135–145)

## 2016-04-13 LAB — GLUCOSE, CAPILLARY
GLUCOSE-CAPILLARY: 119 mg/dL — AB (ref 65–99)
Glucose-Capillary: 93 mg/dL (ref 65–99)

## 2016-04-13 MED ORDER — PANTOPRAZOLE SODIUM 40 MG PO TBEC: 40.0000 mg | DELAYED_RELEASE_TABLET | Freq: Every day | ORAL | Status: DC

## 2016-04-13 MED ORDER — POTASSIUM CHLORIDE CRYS ER 20 MEQ PO TBCR
40.0000 meq | EXTENDED_RELEASE_TABLET | Freq: Once | ORAL | Status: AC
  Administered 2016-04-13: 40 meq via ORAL
  Filled 2016-04-13: qty 2

## 2016-04-13 NOTE — Care Management Important Message (Signed)
Important Message  Patient Details  Name: Peter [name] MRN: [id number] Date of Birth: 10-07-1950   Medicare Important Message Given:  Yes    Nathen May 04/13/2016, 10:52 AM

## 2016-04-13 NOTE — Progress Notes (Signed)
AVS reviewed with patient, including follow up appt, new medications and discharge instructions. Pt's phone, clothing and cane in possession upon d/c. Pt will leave via private vehicle with wife to home.   Lorre Munroe

## 2016-04-13 NOTE — Progress Notes (Signed)
[facility] Progress Note    Since last GI note: No overt bleeding in about 48 hours.  No BM yesterday. Eating regular food.  Feels well and ready to go home  Objective: Vital signs in last 24 hours: Temp:  [97.2 F (36.2 C)-98.7 F (37.1 C)] 98 F (36.7 C) (07/20 0408) Pulse Rate:  [64-92] 68 (07/19 2100) Resp:  [12-27] 18 (07/20 0600) BP: (143-201)/(76-124) 145/86 mmHg (07/20 0400) SpO2:  [96 %-100 %] 98 % (07/20 0325) Last BM Date: 04/11/16 General: alert and oriented times 3 Heart: regular rate and rythm Abdomen: soft, non-tender, non-distended, normal bowel sounds   Lab Results:  Recent Labs  04/11/16 1603 04/12/16 0002 04/13/16 0340  WBC 8.5 9.0 6.5  HGB 10.3* 9.5* 9.7*  PLT 185 171 206  MCV 83.1 82.6 83.1    Recent Labs  04/10/16 2033 04/11/16 0409 04/11/16 0416 04/13/16 0340  NA 142 140 142 135  K 4.3 3.5 3.4* 3.4*  CL 109 107 105 105  CO2 27 26  --  26  GLUCOSE 105* 155* 153* 94  BUN 14 17 20 7   CREATININE 1.05 1.03 1.00 0.76  CALCIUM 9.3 8.9  --  8.6*    Recent Labs  04/10/16 2033 04/11/16 0409  PROT 6.8 6.0*  ALBUMIN 3.8 3.4*  AST 18 16  ALT 14* 14*  ALKPHOS 77 67  BILITOT 0.6 0.7    Recent Labs  04/11/16 0409  INR 1.03    Medications: Scheduled Meds: . sodium chloride  10 mL/hr Intravenous Once  . albuterol  2.5 mg Nebulization TID  . levothyroxine  200 mcg Oral QAC breakfast  . metoprolol tartrate  50 mg Oral BID  . pantoprazole  40 mg Oral Q0600  . sodium chloride flush  10-40 mL Intracatheter Q12H   Continuous Infusions:  PRN Meds:.acetaminophen **OR** acetaminophen, albuterol, hydrALAZINE, ondansetron **OR** ondansetron (ZOFRAN) IV, oxyCODONE, sodium chloride flush    Assessment/Plan: 65 y.o. male with lower GI bleed  Presumed diverticular based on classic presentation, proven diverticulosis on 2016 colonoscopy.  NO overt bleeding in 48 hours.  He is safe for discharge later today and does not need  scheduled GI follow up. He understands that the first 1-2 BMs may look unusual, darker but he will mainly be on alert for rapid BRBPR as when he presented.  Please call or page with any further questions or concerns.    Milus Banister, MD  04/13/2016, 7:10 AM [facility] Pager [phone]

## 2016-04-13 NOTE — Discharge Summary (Signed)
Physician Discharge Summary  [name] [id number] DOB: 1950-12-07 DOA: 04/11/2016  PCP: Peter Reel, MD  Admit date: 04/11/2016 Discharge date: 04/13/2016  Admitted From: (Home,) Disposition:  (Home )  Recommendations for Outpatient Follow-up:  1. Follow up with PCP in 1-2 weeks 2. Please obtain BMP/CBC in one week 3. Please follow up on the following pending results:    Discharge Condition: (Stable,) CODE STATUS:(FULL, ) Diet recommendation: Heart Healthy /  Brief/Interim Summary: Peter [name] is an 65 y.o. male with nonischemic cardiomyopathy with last EF measured in January 2015 was 42% with grade 1 diastolic dysfunction, hypothyroidism, hypertension presents to the ER because of rectal bleeding.   Discharge Diagnoses:  Principal Problem:   Acute GI bleeding Active Problems:   HTN (hypertension)   Hypothyroidism   Acute blood loss anemia    Discharge Instructions Gi bleed probably diverticular bleeding. Currently no bloody bowel movements.  Hemoglobin is 9.5. Received 2 units of prbc transfusion.  Asypmtomatic. Started on clear liquid diet today. Advance diet as tolerated. Able to tolerate regular diet without any issues.  Gi consulted and recommendations given.     Acute blood loss anemia: baseline hemoglobin at 11.  Today is 9.5 . Continue to monitor.  Received 2 units of prbc transfusion.    Hypertension: well controlled.   Hypothyroidism: Resume synthroid.    Medication List    TAKE these medications        albuterol (2.5 MG/3ML) 0.083% nebulizer solution  Commonly known as:  PROVENTIL  Take 3 mLs (2.5 mg total) by nebulization every 6 (six) hours as needed for wheezing or shortness of breath (Albuterol Nebs used for Inhaler per [place]).     docusate sodium 100 MG capsule  Commonly known as:  COLACE  Take 100 mg by mouth 2 (two) times daily.     Fish Oil 1000 MG Caps  Take 3,000 mg by mouth daily.      furosemide 20 MG tablet  Commonly known as:  LASIX  Take 1 tablet (20 mg total) by mouth daily.     isosorbide-hydrALAZINE 20-37.5 MG tablet  Commonly known as:  BIDIL  Take 1 tablet by mouth 2 (two) times daily.     levothyroxine 200 MCG tablet  Commonly known as:  SYNTHROID, LEVOTHROID  Take 200 mcg by mouth daily before breakfast.     metoprolol succinate 100 MG 24 hr tablet  Commonly known as:  TOPROL-XL  Take 100 mg by mouth daily.     multivitamin with minerals Tabs tablet  Take 1 tablet by mouth daily.     oxyCODONE 5 MG immediate release tablet  Commonly known as:  ROXICODONE  Take 1 tablet (5 mg total) by mouth every 4 (four) hours as needed for severe pain.     pantoprazole 40 MG tablet  Commonly known as:  PROTONIX  Take 1 tablet (40 mg total) by mouth daily at 6 (six) AM.           Follow-up Information    Follow up with Peter Reel, MD In 1 week.   Specialty:  Internal Medicine   Why:  please check CBC to check hemoglobin at office visit.    Contact information:   [address] [place] [address] [phone]      Allergies  Allergen Reactions  . Keflex [Cephalexin] Anaphylaxis    Consultations:  Gastroenterology.    Procedures/Studies: Ct Angio Abdomen Pelvis  W &/or Wo Contrast  04/11/2016  CLINICAL DATA:  65 year old male with GI bleed and diarrhea since yesterday EXAM: CTA ABDOMEN AND PELVIS wITHOUT AND WITH CONTRAST TECHNIQUE: Multidetector CT imaging of the abdomen and pelvis was performed using the standard protocol during bolus administration of intravenous contrast. Multiplanar reconstructed images and MIPs were obtained and reviewed to evaluate the vascular anatomy. CONTRAST:  100 mL Isovue-300 COMPARISON:  CT abdomen/ pelvis 12/29/2013 FINDINGS: VASCULAR Aorta: Normal caliber abdominal aorta. Minimal fibro fatty atherosclerotic plaque. No evidence of dissection or penetrating ulcer. Celiac: Widely patent and unremarkable. The lateral  segmental branch of the left hepatic artery is replaced to the left gastric artery. No evidence of dissection, stenosis or aneurysm. SMA: Widely patent and unremarkable. Renals: Single right renal artery is patent. On the left, the dominant renal artery demonstrates fibro fatty atherosclerotic plaque resolving in mild -moderate narrowing. There is a small accessory renal artery supplying the interpolar region. IMA: Patent and unremarkable. Inflow: Mild atherosclerotic plaque without focal stenosis. Proximal Outflow: Relatively disease free and widely patent. Veins: No focal venous abnormality identified. NON-VASCULAR Lower Chest: Mild dependent atelectasis in the right lower lobe with elevation of the posterior aspect of the diaphragm. Similarly, there is mild subsegmental atelectasis in the left lower lobe. No suspicious pulmonary nodule or mass. The heart is normal in size. No pericardial effusion. Unremarkable distal thoracic esophagus. Abdomen: Unremarkable CT appearance of the stomach, duodenum, spleen, adrenal glands and pancreas. Normal hepatic contour and morphology. Stable small cyst in the periphery of the right hepatic lobe. No solid lesion identified. Gallbladder is unremarkable. No intra or extrahepatic biliary ductal dilatation. Normal renal parenchymal enhancement. No hydronephrosis, nephrolithiasis or enhancing mass. Circumscribed low-attenuation lesions bilaterally remain consistent with simple cysts although many of the lesions are too small for accurate characterization. However, the largest 2.7 cm cyst in the upper pole of the left kidney is essentially unchanged. Extensive colonic diverticulosis. There is faint inflammatory interstitial stranding in the pericolonic fat of the sigmoid colon the left lower quadrant concerning for early diverticulitis. No evidence of perforation or fluid collection. Normal appendix in the right lower quadrant. No evidence of obstruction. Pelvis: Prostatomegaly with  indentation of the bladder base. No free fluid or suspicious adenopathy. Bones/Soft Tissues: No acute fracture or aggressive appearing lytic or blastic osseous lesion. Surgical changes of prior L4-L5 posterior lumbar interbody fusion with interbody graft. Degenerative disc disease is also present at L5-S1. Review of the MIP images confirms the above findings. IMPRESSION: 1. No evidence of active contrast extravasation or bleeding at this time. 2. CT findings are concerning for early uncomplicated sigmoid diverticulitis. 3.  Aortic Atherosclerosis (ICD10-170.0) 4. Mild -moderate stenosis of the origin of the left main renal artery secondary to fibro fatty atherosclerotic plaque. 5. Stable hepatic and bilateral renal cysts. 6. Prostatomegaly with indentation of the bladder base. 7. Lower lumbar degenerative disc disease status post L4-L5 posterior lumbar interbody fusion with interbody graft. Stable grade 1 anterolisthesis. Signed, Criselda Peaches, MD [facility] Radiology Electronically Signed   By: Jacqulynn Cadet M.D.   On: 04/11/2016 17:45       Subjective: No new complaints.   Discharge Exam: Filed Vitals:   04/13/16 0800 04/13/16 0900  BP: 150/95   Pulse:    Temp:    Resp: 18 22   Filed Vitals:   04/13/16 0719 04/13/16 0730 04/13/16 0800 04/13/16 0900  BP: 145/86  150/95   Pulse: 83     Temp:  98.7 F (37.1 C)    TempSrc:  Oral    Resp: 16  18 22   Height:      Weight:      SpO2: 96%  98%     General: Pt is alert, awake, not in acute distress Cardiovascular: RRR, S1/S2 +, no rubs, no gallops Respiratory: CTA bilaterally, no wheezing, no rhonchi Abdominal: Soft, NT, ND, bowel sounds + Extremities: no edema, no cyanosis    The results of significant diagnostics from this hospitalization (including imaging, microbiology, ancillary and laboratory) are listed below for reference.     Microbiology: Recent Results (from the  past 240 hour(s))  MRSA PCR Screening     Status: None   Collection Time: 04/11/16  5:54 AM  Result Value Ref Range Status   MRSA by PCR NEGATIVE NEGATIVE Final    Comment:        The GeneXpert MRSA Assay (FDA approved for NASAL specimens only), is one component of a comprehensive MRSA colonization surveillance program. It is not intended to diagnose MRSA infection nor to guide or monitor treatment for MRSA infections.      Labs: BNP (last 3 results) No results for input(s): BNP in the last 8760 hours. Basic Metabolic Panel:  Recent Labs Lab 04/10/16 2033 04/11/16 0409 04/11/16 0416 04/13/16 0340  NA 142 140 142 135  K 4.3 3.5 3.4* 3.4*  CL 109 107 105 105  CO2 27 26  --  26  GLUCOSE 105* 155* 153* 94  BUN 14 17 20 7   CREATININE 1.05 1.03 1.00 0.76  CALCIUM 9.3 8.9  --  8.6*   Liver Function Tests:  Recent Labs Lab 04/10/16 2033 04/11/16 0409  AST 18 16  ALT 14* 14*  ALKPHOS 77 67  BILITOT 0.6 0.7  PROT 6.8 6.0*  ALBUMIN 3.8 3.4*   No results for input(s): LIPASE, AMYLASE in the last 168 hours. No results for input(s): AMMONIA in the last 168 hours. CBC:  Recent Labs Lab 04/10/16 2033 04/11/16 0409 04/11/16 0416 04/11/16 1603 04/12/16 0002 04/13/16 0340  WBC 5.9 7.3  --  8.5 9.0 6.5  HGB 12.4* 10.8* 11.6* 10.3* 9.5* 9.7*  HCT 37.3* 33.5* 34.0* 31.4* 28.4* 29.4*  MCV 85.0 85.0  --  83.1 82.6 83.1  PLT 259 250  --  185 171 206   Cardiac Enzymes: No results for input(s): CKTOTAL, CKMB, CKMBINDEX, TROPONINI in the last 168 hours. BNP: Invalid input(s): POCBNP CBG:  Recent Labs Lab 04/11/16 2202 04/12/16 0723 04/12/16 1514 04/12/16 2330 04/13/16 0727  GLUCAP 97 92 104* 119* 93   D-Dimer No results for input(s): DDIMER in the last 72 hours. Hgb A1c No results for input(s): HGBA1C in the last 72 hours. Lipid Profile No results for input(s): CHOL, HDL, LDLCALC, TRIG, CHOLHDL, LDLDIRECT in the last 72 hours. Thyroid function studies No  results for input(s): TSH, T4TOTAL, T3FREE, THYROIDAB in the last 72 hours.  Invalid input(s): FREET3 Anemia work up No results for input(s): VITAMINB12, FOLATE, FERRITIN, TIBC, IRON, RETICCTPCT in the last 72 hours. Urinalysis    Component Value Date/Time   COLORURINE YELLOW 12/29/2013 2100   APPEARANCEUR CLEAR 12/29/2013 2100   LABSPEC 1.022 12/29/2013 2100   PHURINE 8.0 12/29/2013 2100   GLUCOSEU NEGATIVE 12/29/2013 2100   [place] NEGATIVE 12/29/2013 2100   [place] NEGATIVE 12/29/2013 2100   [place] NEGATIVE 12/29/2013 2100   PROTEINUR NEGATIVE 12/29/2013 2100   UROBILINOGEN 0.2 12/29/2013 2100   NITRITE NEGATIVE 12/29/2013 2100   LEUKOCYTESUR NEGATIVE 12/29/2013 2100   Sepsis Labs Invalid input(s):  PROCALCITONIN,  WBC,  LACTICIDVEN Microbiology Recent Results (from the past 240 hour(s))  MRSA PCR Screening     Status: None   Collection Time: 04/11/16  5:54 AM  Result Value Ref Range Status   MRSA by PCR NEGATIVE NEGATIVE Final    Comment:        The GeneXpert MRSA Assay (FDA approved for NASAL specimens only), is one component of a comprehensive MRSA colonization surveillance program. It is not intended to diagnose MRSA infection nor to guide or monitor treatment for MRSA infections.      Time coordinating discharge: Over 30 minutes  SIGNED:   Hosie Poisson, MD  [facility] 04/13/2016, 9:50 AM Pager   If 7PM-7AM, please contact night-coverage [email or web address] Password [email or web address]

## 2016-04-17 DIAGNOSIS — D62 Acute posthemorrhagic anemia: Secondary | ICD-10-CM | POA: Diagnosis not present

## 2016-04-17 DIAGNOSIS — E538 Deficiency of other specified B group vitamins: Secondary | ICD-10-CM | POA: Diagnosis not present

## 2016-05-12 DIAGNOSIS — D62 Acute posthemorrhagic anemia: Secondary | ICD-10-CM | POA: Diagnosis not present

## 2016-05-12 DIAGNOSIS — K5903 Drug induced constipation: Secondary | ICD-10-CM | POA: Diagnosis not present

## 2016-05-12 DIAGNOSIS — Z6828 Body mass index (BMI) 28.0-28.9, adult: Secondary | ICD-10-CM | POA: Diagnosis not present

## 2016-05-12 DIAGNOSIS — J45909 Unspecified asthma, uncomplicated: Secondary | ICD-10-CM | POA: Diagnosis not present

## 2016-05-12 DIAGNOSIS — I10 Essential (primary) hypertension: Secondary | ICD-10-CM | POA: Diagnosis not present

## 2016-05-12 DIAGNOSIS — I5022 Chronic systolic (congestive) heart failure: Secondary | ICD-10-CM | POA: Diagnosis not present

## 2016-05-12 DIAGNOSIS — K922 Gastrointestinal hemorrhage, unspecified: Secondary | ICD-10-CM | POA: Diagnosis not present

## 2016-06-10 DIAGNOSIS — Z23 Encounter for immunization: Secondary | ICD-10-CM | POA: Diagnosis not present

## 2016-09-28 DIAGNOSIS — E538 Deficiency of other specified B group vitamins: Secondary | ICD-10-CM | POA: Diagnosis not present

## 2016-09-28 DIAGNOSIS — D649 Anemia, unspecified: Secondary | ICD-10-CM | POA: Diagnosis not present

## 2016-12-07 DIAGNOSIS — Z6829 Body mass index (BMI) 29.0-29.9, adult: Secondary | ICD-10-CM | POA: Diagnosis not present

## 2016-12-07 DIAGNOSIS — J309 Allergic rhinitis, unspecified: Secondary | ICD-10-CM | POA: Diagnosis not present

## 2016-12-07 DIAGNOSIS — J45909 Unspecified asthma, uncomplicated: Secondary | ICD-10-CM | POA: Diagnosis not present

## 2016-12-07 DIAGNOSIS — R05 Cough: Secondary | ICD-10-CM | POA: Diagnosis not present

## 2016-12-12 DIAGNOSIS — I10 Essential (primary) hypertension: Secondary | ICD-10-CM | POA: Diagnosis not present

## 2016-12-12 DIAGNOSIS — I429 Cardiomyopathy, unspecified: Secondary | ICD-10-CM | POA: Diagnosis not present

## 2016-12-12 DIAGNOSIS — I5032 Chronic diastolic (congestive) heart failure: Secondary | ICD-10-CM | POA: Diagnosis not present

## 2016-12-12 DIAGNOSIS — I420 Dilated cardiomyopathy: Secondary | ICD-10-CM | POA: Diagnosis not present

## 2016-12-27 DIAGNOSIS — I10 Essential (primary) hypertension: Secondary | ICD-10-CM | POA: Diagnosis not present

## 2017-01-08 DIAGNOSIS — I10 Essential (primary) hypertension: Secondary | ICD-10-CM | POA: Diagnosis not present

## 2017-01-08 DIAGNOSIS — I5032 Chronic diastolic (congestive) heart failure: Secondary | ICD-10-CM | POA: Diagnosis not present

## 2017-02-16 DIAGNOSIS — I10 Essential (primary) hypertension: Secondary | ICD-10-CM | POA: Diagnosis not present

## 2017-02-16 DIAGNOSIS — Z125 Encounter for screening for malignant neoplasm of prostate: Secondary | ICD-10-CM | POA: Diagnosis not present

## 2017-02-16 DIAGNOSIS — E784 Other hyperlipidemia: Secondary | ICD-10-CM | POA: Diagnosis not present

## 2017-02-16 DIAGNOSIS — E538 Deficiency of other specified B group vitamins: Secondary | ICD-10-CM | POA: Diagnosis not present

## 2017-02-16 DIAGNOSIS — R7309 Other abnormal glucose: Secondary | ICD-10-CM | POA: Diagnosis not present

## 2017-02-16 DIAGNOSIS — E038 Other specified hypothyroidism: Secondary | ICD-10-CM | POA: Diagnosis not present

## 2017-02-23 ENCOUNTER — Other Ambulatory Visit: Payer: Self-pay | Admitting: Internal Medicine

## 2017-02-23 DIAGNOSIS — R05 Cough: Secondary | ICD-10-CM | POA: Diagnosis not present

## 2017-02-23 DIAGNOSIS — R972 Elevated prostate specific antigen [PSA]: Secondary | ICD-10-CM

## 2017-02-23 DIAGNOSIS — I130 Hypertensive heart and chronic kidney disease with heart failure and stage 1 through stage 4 chronic kidney disease, or unspecified chronic kidney disease: Secondary | ICD-10-CM | POA: Diagnosis not present

## 2017-02-23 DIAGNOSIS — I5022 Chronic systolic (congestive) heart failure: Secondary | ICD-10-CM | POA: Diagnosis not present

## 2017-02-23 DIAGNOSIS — D62 Acute posthemorrhagic anemia: Secondary | ICD-10-CM | POA: Diagnosis not present

## 2017-02-23 DIAGNOSIS — N179 Acute kidney failure, unspecified: Secondary | ICD-10-CM

## 2017-02-23 DIAGNOSIS — K922 Gastrointestinal hemorrhage, unspecified: Secondary | ICD-10-CM | POA: Diagnosis not present

## 2017-02-23 DIAGNOSIS — Z1389 Encounter for screening for other disorder: Secondary | ICD-10-CM | POA: Diagnosis not present

## 2017-02-23 DIAGNOSIS — E038 Other specified hypothyroidism: Secondary | ICD-10-CM | POA: Diagnosis not present

## 2017-02-23 DIAGNOSIS — Z6828 Body mass index (BMI) 28.0-28.9, adult: Secondary | ICD-10-CM | POA: Diagnosis not present

## 2017-02-23 DIAGNOSIS — J45909 Unspecified asthma, uncomplicated: Secondary | ICD-10-CM | POA: Diagnosis not present

## 2017-02-23 DIAGNOSIS — Z0000 Encounter for general adult medical examination without abnormal findings: Secondary | ICD-10-CM | POA: Diagnosis not present

## 2017-02-23 DIAGNOSIS — N189 Chronic kidney disease, unspecified: Secondary | ICD-10-CM

## 2017-02-23 DIAGNOSIS — I420 Dilated cardiomyopathy: Secondary | ICD-10-CM | POA: Diagnosis not present

## 2017-02-23 DIAGNOSIS — Z1212 Encounter for screening for malignant neoplasm of rectum: Secondary | ICD-10-CM | POA: Diagnosis not present

## 2017-03-01 ENCOUNTER — Ambulatory Visit
Admission: RE | Admit: 2017-03-01 | Discharge: 2017-03-01 | Disposition: A | Discharge status: Home / Self Care | Payer: Medicare Other | Source: Ambulatory Visit | Attending: Internal Medicine | Admitting: Internal Medicine

## 2017-03-01 DIAGNOSIS — N189 Chronic kidney disease, unspecified: Secondary | ICD-10-CM

## 2017-03-01 DIAGNOSIS — N179 Acute kidney failure, unspecified: Secondary | ICD-10-CM

## 2017-03-01 DIAGNOSIS — R972 Elevated prostate specific antigen [PSA]: Secondary | ICD-10-CM

## 2017-03-01 DIAGNOSIS — N200 Calculus of kidney: Secondary | ICD-10-CM | POA: Diagnosis not present

## 2017-03-08 DIAGNOSIS — R972 Elevated prostate specific antigen [PSA]: Secondary | ICD-10-CM | POA: Diagnosis not present

## 2017-03-08 DIAGNOSIS — I130 Hypertensive heart and chronic kidney disease with heart failure and stage 1 through stage 4 chronic kidney disease, or unspecified chronic kidney disease: Secondary | ICD-10-CM | POA: Diagnosis not present

## 2017-04-11 DIAGNOSIS — I10 Essential (primary) hypertension: Secondary | ICD-10-CM | POA: Diagnosis not present

## 2017-06-03 ENCOUNTER — Emergency Department (HOSPITAL_COMMUNITY): Payer: Medicare Other

## 2017-06-03 ENCOUNTER — Encounter (HOSPITAL_COMMUNITY): Payer: Self-pay | Admitting: Emergency Medicine

## 2017-06-03 ENCOUNTER — Inpatient Hospital Stay (HOSPITAL_COMMUNITY)
Admission: EM | Admit: 2017-06-03 | Discharge: 2017-06-05 | DRG: 202 | Disposition: A | Discharge status: Home / Self Care | Payer: Medicare Other | Attending: Internal Medicine | Admitting: Internal Medicine

## 2017-06-03 DIAGNOSIS — E039 Hypothyroidism, unspecified: Secondary | ICD-10-CM | POA: Diagnosis not present

## 2017-06-03 DIAGNOSIS — I429 Cardiomyopathy, unspecified: Secondary | ICD-10-CM | POA: Diagnosis not present

## 2017-06-03 DIAGNOSIS — Z923 Personal history of irradiation: Secondary | ICD-10-CM

## 2017-06-03 DIAGNOSIS — J45901 Unspecified asthma with (acute) exacerbation: Principal | ICD-10-CM | POA: Diagnosis present

## 2017-06-03 DIAGNOSIS — J9601 Acute respiratory failure with hypoxia: Secondary | ICD-10-CM | POA: Diagnosis not present

## 2017-06-03 DIAGNOSIS — G4731 Primary central sleep apnea: Secondary | ICD-10-CM

## 2017-06-03 DIAGNOSIS — Z87891 Personal history of nicotine dependence: Secondary | ICD-10-CM

## 2017-06-03 DIAGNOSIS — T8149XA Infection following a procedure, other surgical site, initial encounter: Secondary | ICD-10-CM | POA: Diagnosis present

## 2017-06-03 DIAGNOSIS — I10 Essential (primary) hypertension: Secondary | ICD-10-CM | POA: Diagnosis present

## 2017-06-03 DIAGNOSIS — R0602 Shortness of breath: Secondary | ICD-10-CM | POA: Diagnosis not present

## 2017-06-03 DIAGNOSIS — J9600 Acute respiratory failure, unspecified whether with hypoxia or hypercapnia: Secondary | ICD-10-CM | POA: Diagnosis present

## 2017-06-03 HISTORY — DX: Personal history of irradiation: Z92.3

## 2017-06-03 LAB — CBC WITH DIFFERENTIAL/PLATELET
Basophils Absolute: 0 10*3/uL (ref 0.0–0.1)
Basophils Relative: 0 %
EOS ABS: 0 10*3/uL (ref 0.0–0.7)
EOS PCT: 0 %
HCT: 46.5 % (ref 39.0–52.0)
Hemoglobin: 15.7 g/dL (ref 13.0–17.0)
Lymphocytes Relative: 6 %
Lymphs Abs: 0.6 10*3/uL — ABNORMAL LOW (ref 0.7–4.0)
MCH: 28.9 pg (ref 26.0–34.0)
MCHC: 33.8 g/dL (ref 30.0–36.0)
MCV: 85.6 fL (ref 78.0–100.0)
Monocytes Absolute: 0.3 10*3/uL (ref 0.1–1.0)
Monocytes Relative: 3 %
NEUTROS PCT: 90 %
Neutro Abs: 8.4 10*3/uL — ABNORMAL HIGH (ref 1.7–7.7)
PLATELETS: 201 10*3/uL (ref 150–400)
RBC: 5.43 MIL/uL (ref 4.22–5.81)
RDW: 13.4 % (ref 11.5–15.5)
WBC: 9.3 10*3/uL (ref 4.0–10.5)

## 2017-06-03 LAB — BASIC METABOLIC PANEL
ANION GAP: 13 (ref 5–15)
BUN: 15 mg/dL (ref 6–20)
CO2: 20 mmol/L — ABNORMAL LOW (ref 22–32)
Calcium: 9.5 mg/dL (ref 8.9–10.3)
Chloride: 108 mmol/L (ref 101–111)
Creatinine, Ser: 0.83 mg/dL (ref 0.61–1.24)
Glucose, Bld: 103 mg/dL — ABNORMAL HIGH (ref 65–99)
POTASSIUM: 4.2 mmol/L (ref 3.5–5.1)
SODIUM: 141 mmol/L (ref 135–145)

## 2017-06-03 LAB — CREATININE, SERUM: CREATININE: 0.89 mg/dL (ref 0.61–1.24)

## 2017-06-03 LAB — CBC
HCT: 42.2 % (ref 39.0–52.0)
Hemoglobin: 14.2 g/dL (ref 13.0–17.0)
MCH: 28.5 pg (ref 26.0–34.0)
MCHC: 33.6 g/dL (ref 30.0–36.0)
MCV: 84.7 fL (ref 78.0–100.0)
PLATELETS: 239 10*3/uL (ref 150–400)
RBC: 4.98 MIL/uL (ref 4.22–5.81)
RDW: 12.9 % (ref 11.5–15.5)
WBC: 4.5 10*3/uL (ref 4.0–10.5)

## 2017-06-03 LAB — TSH: TSH: <0.01 u[IU]/mL — ABNORMAL LOW (ref 0.350–4.500)

## 2017-06-03 MED ORDER — OXYCODONE HCL 5 MG PO TABS: 5.0000 mg | ORAL_TABLET | ORAL | Status: DC | PRN

## 2017-06-03 MED ORDER — ENOXAPARIN SODIUM 40 MG/0.4ML ~~LOC~~ SOLN
40.0000 mg | SUBCUTANEOUS | Status: DC
  Administered 2017-06-03 – 2017-06-04 (×2): 40 mg via SUBCUTANEOUS
  Filled 2017-06-03 (×3): qty 0.4

## 2017-06-03 MED ORDER — PANTOPRAZOLE SODIUM 40 MG PO TBEC
40.0000 mg | DELAYED_RELEASE_TABLET | Freq: Every day | ORAL | Status: DC
  Administered 2017-06-03 – 2017-06-05 (×3): 40 mg via ORAL
  Filled 2017-06-03 (×3): qty 1

## 2017-06-03 MED ORDER — ALBUTEROL SULFATE (2.5 MG/3ML) 0.083% IN NEBU
2.5000 mg | INHALATION_SOLUTION | RESPIRATORY_TRACT | Status: DC | PRN
  Administered 2017-06-03: 2.5 mg via RESPIRATORY_TRACT
  Filled 2017-06-03: qty 3

## 2017-06-03 MED ORDER — ISOSORB DINITRATE-HYDRALAZINE 20-37.5 MG PO TABS: 1.0000 | ORAL_TABLET | Freq: Two times a day (BID) | ORAL | Status: DC

## 2017-06-03 MED ORDER — METHYLPREDNISOLONE SODIUM SUCC 40 MG IJ SOLR
40.0000 mg | Freq: Three times a day (TID) | INTRAMUSCULAR | Status: DC
  Administered 2017-06-03 – 2017-06-04 (×3): 40 mg via INTRAVENOUS
  Filled 2017-06-03 (×4): qty 1

## 2017-06-03 MED ORDER — ACETAMINOPHEN 650 MG RE SUPP: 650.0000 mg | Freq: Four times a day (QID) | RECTAL | Status: DC | PRN

## 2017-06-03 MED ORDER — IPRATROPIUM-ALBUTEROL 0.5-2.5 (3) MG/3ML IN SOLN: 3.0000 mL | RESPIRATORY_TRACT | Status: DC | PRN

## 2017-06-03 MED ORDER — LORATADINE 10 MG PO TABS: 10.0000 mg | ORAL_TABLET | Freq: Every day | ORAL | Status: DC

## 2017-06-03 MED ORDER — METHYLPREDNISOLONE SODIUM SUCC 125 MG IJ SOLR
125.0000 mg | Freq: Once | INTRAMUSCULAR | Status: AC
  Administered 2017-06-03: 125 mg via INTRAVENOUS
  Filled 2017-06-03: qty 2

## 2017-06-03 MED ORDER — ONDANSETRON HCL 4 MG/2ML IJ SOLN: 4.0000 mg | Freq: Four times a day (QID) | INTRAMUSCULAR | Status: DC | PRN

## 2017-06-03 MED ORDER — IPRATROPIUM-ALBUTEROL 0.5-2.5 (3) MG/3ML IN SOLN
3.0000 mL | Freq: Once | RESPIRATORY_TRACT | Status: AC
  Administered 2017-06-03: 3 mL via RESPIRATORY_TRACT

## 2017-06-03 MED ORDER — LEVOTHYROXINE SODIUM 100 MCG PO TABS
200.0000 ug | ORAL_TABLET | Freq: Every day | ORAL | Status: DC
  Administered 2017-06-03 – 2017-06-04 (×2): 200 ug via ORAL
  Filled 2017-06-03: qty 2
  Filled 2017-06-03: qty 1

## 2017-06-03 MED ORDER — METOPROLOL SUCCINATE ER 100 MG PO TB24: 100.0000 mg | ORAL_TABLET | Freq: Every day | ORAL | Status: DC

## 2017-06-03 MED ORDER — MONTELUKAST SODIUM 10 MG PO TABS
10.0000 mg | ORAL_TABLET | Freq: Every day | ORAL | Status: DC
Start: 2017-06-03 — End: 2017-06-05
  Administered 2017-06-03 – 2017-06-04 (×2): 10 mg via ORAL
  Filled 2017-06-03 (×2): qty 1

## 2017-06-03 MED ORDER — METHYLPREDNISOLONE SODIUM SUCC 125 MG IJ SOLR: 80.0000 mg | Freq: Four times a day (QID) | INTRAMUSCULAR | Status: DC

## 2017-06-03 MED ORDER — LORATADINE 10 MG PO TABS
10.0000 mg | ORAL_TABLET | Freq: Every day | ORAL | Status: DC
  Administered 2017-06-03 – 2017-06-05 (×3): 10 mg via ORAL
  Filled 2017-06-03 (×3): qty 1

## 2017-06-03 MED ORDER — POLYETHYLENE GLYCOL 3350 17 G PO PACK: 17.0000 g | PACK | Freq: Every day | ORAL | Status: DC | PRN

## 2017-06-03 MED ORDER — METHYLPREDNISOLONE SODIUM SUCC 40 MG IJ SOLR: 40.0000 mg | Freq: Four times a day (QID) | INTRAMUSCULAR | Status: DC

## 2017-06-03 MED ORDER — ONDANSETRON HCL 4 MG PO TABS: 4.0000 mg | ORAL_TABLET | Freq: Four times a day (QID) | ORAL | Status: DC | PRN

## 2017-06-03 MED ORDER — DOCUSATE SODIUM 100 MG PO CAPS
100.0000 mg | ORAL_CAPSULE | Freq: Two times a day (BID) | ORAL | Status: DC
  Administered 2017-06-03 (×2): 100 mg via ORAL
  Filled 2017-06-03 (×5): qty 1

## 2017-06-03 MED ORDER — IPRATROPIUM-ALBUTEROL 0.5-2.5 (3) MG/3ML IN SOLN
3.0000 mL | Freq: Four times a day (QID) | RESPIRATORY_TRACT | Status: DC
  Administered 2017-06-03 – 2017-06-04 (×4): 3 mL via RESPIRATORY_TRACT
  Filled 2017-06-03 (×4): qty 3

## 2017-06-03 MED ORDER — FUROSEMIDE 20 MG PO TABS
20.0000 mg | ORAL_TABLET | Freq: Every day | ORAL | Status: DC
  Administered 2017-06-03 – 2017-06-04 (×2): 20 mg via ORAL
  Filled 2017-06-03 (×2): qty 1

## 2017-06-03 MED ORDER — ACETAMINOPHEN 325 MG PO TABS: 650.0000 mg | ORAL_TABLET | Freq: Four times a day (QID) | ORAL | Status: DC | PRN

## 2017-06-03 MED ORDER — ALBUTEROL (5 MG/ML) CONTINUOUS INHALATION SOLN
10.0000 mg/h | INHALATION_SOLUTION | RESPIRATORY_TRACT | Status: AC
  Administered 2017-06-03: 10 mg/h via RESPIRATORY_TRACT
  Filled 2017-06-03: qty 20

## 2017-06-03 MED ORDER — HYDRALAZINE HCL 20 MG/ML IJ SOLN
5.0000 mg | Freq: Four times a day (QID) | INTRAMUSCULAR | Status: DC | PRN
  Administered 2017-06-03 – 2017-06-04 (×2): 5 mg via INTRAVENOUS
  Filled 2017-06-03 (×2): qty 1

## 2017-06-03 MED ORDER — IRBESARTAN 75 MG PO TABS
75.0000 mg | ORAL_TABLET | Freq: Every day | ORAL | Status: DC
  Administered 2017-06-03 – 2017-06-05 (×3): 75 mg via ORAL
  Filled 2017-06-03 (×3): qty 1

## 2017-06-03 MED ORDER — IPRATROPIUM-ALBUTEROL 0.5-2.5 (3) MG/3ML IN SOLN
RESPIRATORY_TRACT | Status: AC
  Administered 2017-06-03: 3 mL via RESPIRATORY_TRACT
  Filled 2017-06-03: qty 3

## 2017-06-03 NOTE — ED Notes (Signed)
Pt has Bipap off to take meds and eat jello.

## 2017-06-03 NOTE — ED Notes (Signed)
Report attempted 

## 2017-06-03 NOTE — ED Notes (Signed)
Pt back on Bipap after get up to use restroom become very short of breath with labored breathing respirations 28 however 02 sats remained 98% on 3L [place]

## 2017-06-03 NOTE — ED Notes (Signed)
Provider notified of several attempts to draw blood without success

## 2017-06-03 NOTE — ED Notes (Signed)
Pt request to remove Bipap. Pt alert breathing easily. Bipap removed.

## 2017-06-03 NOTE — ED Notes (Signed)
Report given to [facility]

## 2017-06-03 NOTE — H&P (Signed)
PCP:   Shon Baton, MD  Cardiologist: Dr. Tamsen Snider  Chief Complaint:  Shortness of breath and wheezing  HPI: this is a 66 y/o male with known history of childhood asthma. He states his been better controlled in recent years but still there. He states he has been having worsening shortness of breath over the past week. Symptoms have been waxing and waning since Thursday the pain continues. The cough and no wheeze. Reported shortness of breath. He denies any fevers, chills, nausea or vomiting. He's been using his nebulizer increasingly, currently using it every hour without improvement. His wife finally insisted he come to ER.  At baseline the patient states he uses his nebulizer 4-5 times daily and a good day. His inhaler last him approximately 1 week. Currently at home he uses only Zyrtec and inhaler. He states he's been on many asthma medications and none have controlled his symptoms. Approximately 4-5 months ago he was initiated on Breo and this appears to help, however, his insurance does not cover Breo so she has not used this in months. He has never seen a pulmonologist.  Review of Systems:  The patient denies anorexia, fever, weight loss,, vision loss, decreased hearing, hoarseness, chest pain, syncope, shortness of breath, wheeze, peripheral edema, balance deficits, hemoptysis, abdominal pain, melena, hematochezia, severe indigestion/heartburn, hematuria, incontinence, genital sores, muscle weakness, suspicious skin lesions, transient blindness, difficulty walking, depression, unusual weight change, abnormal bleeding, enlarged lymph nodes, angioedema, and breast masses.  Past Medical History: Past Medical History:  Diagnosis Date  . Acute combined systolic and diastolic heart failure ([place]) 03/11/2013  . Allergy   . Arthritis    right knee  . Asthma   . Back pain   . Cardiomyopathy ([place]) 2014   "viral"  . Colon polyps 2003, 2015   2003: hyperplastic. 05/2014: tubular adenoma  .  Complication of anesthesia    problems urinating after anesthesia  . Diverticulosis of colon 2003, 2015  . Hypertension   . Hypothyroidism    had Graves' disease - thyroid ablation  . MRSA (methicillin resistant Staphylococcus aureus) infection    lumbar-2015  . Obstructive sleep apnea syndrome 09/22/2014   uses cpap  . Substance abuse    not in many years  . Wears glasses   . Wears partial dentures    top   Past Surgical History:  Procedure Laterality Date  . ANTERIOR CERVICAL DECOMP/DISCECTOMY FUSION N/A 07/30/2013   Procedure: CERVICAL FIVE,CERVICAL SIX CORPECTOMY,ANTERIOR ARTHRODESIS,PEEK INTERBODY GRAFT CERVICAL FOUR-CERVICAL SEVEN,ANTERIOR INSTRUMENTATION;  Surgeon: Winfield Cunas, MD;  Location: [place]UR[facility] ORS;  Service: Neurosurgery;  Laterality: N/A;  . ANTERIOR CERVICAL DECOMP/DISCECTOMY FUSION N/A 04/16/2015   Procedure: CERVICAL THREE-FOUR ANTERIOR CERVICAL DECOMPRESSION/DISCECTOMY FUSION 1 LEVEL;  Surgeon: Ashok Pall, MD;  Location: [place] NEURO ORS;  Service: Neurosurgery;  Laterality: N/A;  C34 anterior cervical decompression with fusion plating and bonegraft  . BACK SURGERY  1/15   lumb fusion  . CARDIAC CATHETERIZATION  2014  . CARPAL TUNNEL RELEASE Right 03/23/2014   Procedure: RIGHT CARPAL TUNNEL RELEASE;  Surgeon: Tennis Must, MD;  Location: [facility];  Service: Orthopedics;  Laterality: Right;  . COLONOSCOPY  2003, 2015  . LEFT AND RIGHT HEART CATHETERIZATION WITH CORONARY ANGIOGRAM N/A 03/11/2013   Procedure: LEFT AND RIGHT HEART CATHETERIZATION WITH CORONARY ANGIOGRAM;  Surgeon: Laverda Page, MD;  Location[facility] C[facility];  Service: Cardiovascular;  Laterality: N/A;  . LUMBAR WOUND DEBRIDEMENT N/A 11/07/2013   Procedure: LUMBAR WOUND DEBRIDEMENT;  Surgeon:  Winfield Cunas, MD;  Location: [place] NEURO ORS;  Service: Neurosurgery;  Laterality: N/A;  . TONSILLECTOMY  1958    Medications: Prior to Admission medications   Medication Sig Start Date End Date  Taking? Authorizing Provider  albuterol (PROVENTIL) (2.5 MG/3ML) 0.083% nebulizer solution Take 3 mLs (2.5 mg total) by nebulization every 6 (six) hours as needed for wheezing or shortness of breath (Albuterol Nebs used for Inhaler per [place]). 04/18/15   Kritzer, Louie Casa, MD  docusate sodium (COLACE) 100 MG capsule Take 100 mg by mouth 2 (two) times daily.    [provider]  furosemide (LASIX) 20 MG tablet Take 1 tablet (20 mg total) by mouth daily. Patient taking differently: Take 20 mg by mouth daily. Instructed to take as needed 08/25/13   Shon Baton, MD  isosorbide-hydrALAZINE (BIDIL) 20-37.5 MG per tablet Take 1 tablet by mouth 2 (two) times daily. 08/14/13   Shon Baton, MD  levothyroxine (SYNTHROID, LEVOTHROID) 200 MCG tablet Take 200 mcg by mouth daily before breakfast.    [provider]  metoprolol succinate (TOPROL-XL) 100 MG 24 hr tablet Take 100 mg by mouth daily. 04/03/16   [provider]  Multiple Vitamin (MULTIVITAMIN WITH MINERALS) TABS Take 1 tablet by mouth daily.    [provider]  Omega-3 Fatty Acids (FISH OIL) 1000 MG CAPS Take 3,000 mg by mouth daily.    [provider]  oxyCODONE (ROXICODONE) 5 MG immediate release tablet Take 1 tablet (5 mg total) by mouth every 4 (four) hours as needed for severe pain. 04/18/15   Kritzer, Louie Casa, MD  pantoprazole (PROTONIX) 40 MG tablet Take 1 tablet (40 mg total) by mouth daily at 6 (six) AM. 04/13/16   Hosie Poisson, MD    Allergies:   Allergies  Allergen Reactions  . Keflex [Cephalexin] Anaphylaxis    Social History:  reports that he quit smoking about 21 years ago. He quit after 0.00 years of use. He has never used smokeless tobacco. He reports that he does not drink alcohol or use drugs.  Family History: Family History  Problem Relation Age of Onset  . Colon cancer Neg Hx     Physical Exam: Vitals:   06/03/17 0415 06/03/17 0428 06/03/17 0500 06/03/17 0515  BP:  (!) 163/94  (!) 152/99 (!) 171/95  Pulse:  (!) 105 (!) 104 (!) 103  Resp:      Temp:      TempSrc:      SpO2:  100% 95% 94%    General:  Alert and oriented times three, well developed and nourished Eyes: PERRLA, pink conjunctiva, no scleral icterus ENT: Moist oral mucosa, neck supple, no thyromegaly Lungs: sctattered wheeze, tight breaths, positive use of accessory muscles Cardiovascular: regular rate and rhythm, no regurgitation, no gallops, no murmurs. No carotid bruits, no JVD Abdomen: soft, positive BS, non-tender, non-distended, no organomegaly, not an acute abdomen GU: not examined Neuro: CN II - XII grossly intact, sensation intact Musculoskeletal: strength 5/5 all extremities, no clubbing, cyanosis or edema Skin: no rash, no subcutaneous crepitation, no decubitus Psych: appropriate patient   Labs on Admission:   Recent Labs  06/03/17 0432  NA 141  K 4.2  CL 108  CO2 20*  GLUCOSE 103*  [name] 15  CREATININE 0.83  CALCIUM 9.5   No results for input(s): AST, ALT, ALKPHOS, BILITOT, PROT, ALBUMIN in the last 72 hours. No results for input(s): LIPASE, AMYLASE in the last 72 hours.  Recent Labs  06/03/17 0432  WBC 9.3  NEUTROABS 8.4*  HGB 15.7  HCT 46.5  MCV 85.6  PLT PENDING   No results for input(s): CKTOTAL, CKMB, CKMBINDEX, TROPONINI in the last 72 hours. Invalid input(s): POCBNP No results for input(s): DDIMER in the last 72 hours. No results for input(s): HGBA1C in the last 72 hours. No results for input(s): CHOL, HDL, LDLCALC, TRIG, CHOLHDL, LDLDIRECT in the last 72 hours. No results for input(s): TSH, T4TOTAL, T3FREE, THYROIDAB in the last 72 hours.  Invalid input(s): FREET3 No results for input(s): VITAMINB12, FOLATE, FERRITIN, TIBC, IRON, RETICCTPCT in the last 72 hours.  Micro Results: No results found for this or any previous visit (from the past 240 hour(s)).   Radiological Exams on Admission: Dg Chest Port 1 View  Result Date:  06/03/2017 CLINICAL DATA:  Shortness of breath EXAM: PORTABLE CHEST 1 VIEW COMPARISON:  08/14/2013 FINDINGS: The heart size and mediastinal contours are within normal limits. Both lungs are clear. Partially visualized cervical spine hardware. IMPRESSION: No active disease. Electronically Signed   By: Donavan Foil M.D.   On: 06/03/2017 01:08    Assessment/Plan Present on Admission: . Asthma exacerbation  -Admit to stepdown -BiPAP -Solu-Medrol, doing nebs, respiratory to evaluate and treat -ABGs when necessary -Resume Zyrtec  . HTN (hypertension) -poor control, maybe d/t stress -home meds resumed -PRN hydralazine  . Hypothyroidism -Stable, home medications resumed -TSH level ordered  Obstructive sleep apnea -order CPAP once patient off BiPAP  Cardiomyopathy -Stable, home medications resumed   Peter [name] 06/03/2017, 5:48 AM

## 2017-06-03 NOTE — ED Triage Notes (Signed)
Pt has a history of asthma. Has had shortness of breath that has been getting worse. Pt has been using albuterol nebulizer's at home without relief. Pt had labor breathing with diaphoresis in triage.

## 2017-06-03 NOTE — ED Notes (Addendum)
Unsuccessful blood draw, QNS and clotted.  Nurse notified

## 2017-06-03 NOTE — ED Notes (Signed)
Report attempted x2. Made aware will transport pt to floor once bed has been assigned for 45 minutes.

## 2017-06-03 NOTE — ED Notes (Signed)
Pt requesting to be placed back on bipap. RT at bedside.

## 2017-06-03 NOTE — Progress Notes (Signed)
[facility]  Patient evaluated, chart reviewed. He was admitted for an Asthma exacerbation. Agree with current plan for BiPAP/ SDU. No signs of acute infection.   Debbe Odea, MD

## 2017-06-03 NOTE — Progress Notes (Signed)
This note also relates to the following rows which could not be included: Pulse Rate - Cannot attach notes to unvalidated device data SpO2 - Cannot attach notes to unvalidated device data  Took pt off bipap and placed on 3l

## 2017-06-03 NOTE — ED Notes (Signed)
Trailing patient off of BiPap. Pt is still labored and having wheezing

## 2017-06-03 NOTE — ED Provider Notes (Signed)
[facility] DEPT Provider Note   CSN: [id number] Arrival date & time: 06/03/17  0031     History   Chief Complaint Chief Complaint  Patient presents with  . Shortness of Breath    HPI Elic Peter [name] is a 66 y.o. male.  History of asthma presents to the emergency department with chief complaint of shortness of breath. He states that he has been wheezing for most of the week. He reports taking regular home medicines with no relief. He states that today his symptoms became such that he had to come to the emergency department for treatment. He states that he feels tight in his chest, but otherwise denies any chest pain. He denies ever having needed intubation in the past. Denies any cough or fever. Denies any other associated symptoms.   The history is provided by the patient. No language interpreter was used.    Past Medical History:  Diagnosis Date  . Acute combined systolic and diastolic heart failure ([place]) 03/11/2013  . Allergy   . Arthritis    right knee  . Asthma   . Back pain   . Cardiomyopathy ([place]) 2014   "viral"  . Colon polyps 2003, 2015   2003: hyperplastic. 05/2014: tubular adenoma  . Complication of anesthesia    problems urinating after anesthesia  . Diverticulosis of colon 2003, 2015  . Hypertension   . Hypothyroidism    had Graves' disease - thyroid ablation  . MRSA (methicillin resistant Staphylococcus aureus) infection    lumbar-2015  . Obstructive sleep apnea syndrome 09/22/2014   uses cpap  . Substance abuse    not in many years  . Wears glasses   . Wears partial dentures    top    Patient Active Problem List   Diagnosis Date Noted  . Lower GI bleed 04/11/2016  . Acute GI bleeding 04/11/2016  . Acute blood loss anemia 04/11/2016  . HNP (herniated nucleus pulposus), cervical 04/16/2015  . Obstructive sleep apnea syndrome 09/22/2014  . Complex sleep apnea syndrome 09/22/2014  . Hypersomnia due to drug ([place])   . Hypothyroid 11/12/2013  .  Normocytic anemia 11/12/2013  . Wound infection after surgery 11/07/2013  . Spondylolisthesis of lumbar region 10/22/2013  . Dizziness and giddiness 08/13/2013    Class: Acute  . Acute renal insufficiency 08/13/2013    Class: Acute  . Spondylosis, cervical, with myelopathy 08/01/2013  . Weakness 06/03/2013  . Acute combined systolic and diastolic heart failure ([place]) 03/11/2013  . Volume overload 03/11/2013  . HTN (hypertension) 03/11/2013  . Abnormal CK 03/11/2013  . DOE (dyspnea on exertion) 03/11/2013  . Hypothyroidism 03/11/2013    Past Surgical History:  Procedure Laterality Date  . ANTERIOR CERVICAL DECOMP/DISCECTOMY FUSION N/A 07/30/2013   Procedure: CERVICAL FIVE,CERVICAL SIX CORPECTOMY,ANTERIOR ARTHRODESIS,PEEK INTERBODY GRAFT CERVICAL FOUR-CERVICAL SEVEN,ANTERIOR INSTRUMENTATION;  Surgeon: Winfield Cunas, MD;  Location: [facility] NEURO ORS;  Service: Neurosurgery;  Laterality: N/A;  . ANTERIOR CERVICAL DECOMP/DISCECTOMY FUSION N/A 04/16/2015   Procedure: CERVICAL THREE-FOUR ANTERIOR CERVICAL DECOMPRESSION/DISCECTOMY FUSION 1 LEVEL;  Surgeon: Ashok Pall, MD;  Location: [facility] NEURO ORS;  Service: Neurosurgery;  Laterality: N/A;  C34 anterior cervical decompression with fusion plating and bonegraft  . BACK SURGERY  1/15   lumb fusion  . CARDIAC CATHETERIZATION  2014  . CARPAL TUNNEL RELEASE Right 03/23/2014   Procedure: RIGHT CARPAL TUNNEL RELEASE;  Surgeon: Tennis Must, MD;  Location: [facility];  Service: Orthopedics;  Laterality: Right;  . COLONOSCOPY  2003, 2015  .  LEFT AND RIGHT HEART CATHETERIZATION WITH CORONARY ANGIOGRAM N/A 03/11/2013   Procedure: LEFT AND RIGHT HEART CATHETERIZATION WITH CORONARY ANGIOGRAM;  Surgeon: Laverda Page, MD;  Location[facility];  Service: Cardiovascular;  Laterality: N/A;  . LUMBAR WOUND DEBRIDEMENT N/A 11/07/2013   Procedure: LUMBAR WOUND DEBRIDEMENT;  Surgeon: Winfield Cunas, MD;  Location: [facility] NEURO ORS;  Service: Neurosurgery;   Laterality: N/A;  . [place] Medications    Prior to Admission medications   Medication Sig Start Date End Date Taking? Authorizing Provider  albuterol (PROVENTIL) (2.5 MG/3ML) 0.083% nebulizer solution Take 3 mLs (2.5 mg total) by nebulization every 6 (six) hours as needed for wheezing or shortness of breath (Albuterol Nebs used for Inhaler per [place]). 04/18/15   Kritzer, Louie Casa, MD  docusate sodium (COLACE) 100 MG capsule Take 100 mg by mouth 2 (two) times daily.    [provider]  furosemide (LASIX) 20 MG tablet Take 1 tablet (20 mg total) by mouth daily. Patient taking differently: Take 20 mg by mouth daily. Instructed to take as needed 08/25/13   Shon Baton, MD  isosorbide-hydrALAZINE (BIDIL) 20-37.5 MG per tablet Take 1 tablet by mouth 2 (two) times daily. 08/14/13   Shon Baton, MD  levothyroxine (SYNTHROID, LEVOTHROID) 200 MCG tablet Take 200 mcg by mouth daily before breakfast.    [provider]  metoprolol succinate (TOPROL-XL) 100 MG 24 hr tablet Take 100 mg by mouth daily. 04/03/16   [provider]  Multiple Vitamin (MULTIVITAMIN WITH MINERALS) TABS Take 1 tablet by mouth daily.    [provider]  Omega-3 Fatty Acids (FISH OIL) 1000 MG CAPS Take 3,000 mg by mouth daily.    [provider]  oxyCODONE (ROXICODONE) 5 MG immediate release tablet Take 1 tablet (5 mg total) by mouth every 4 (four) hours as needed for severe pain. 04/18/15   Kritzer, Louie Casa, MD  pantoprazole (PROTONIX) 40 MG tablet Take 1 tablet (40 mg total) by mouth daily at 6 (six) AM. 04/13/16   Hosie Poisson, MD    Family History Family History  Problem Relation Age of Onset  . Colon cancer Neg Hx     Social History Social History  Substance Use Topics  . Smoking status: Former Smoker    Years: 0.00    Quit date: 08/14/1995  . Smokeless tobacco: Never Used  . Alcohol use No     Allergies   Keflex  [cephalexin]   Review of Systems Review of Systems  All other systems reviewed and are negative.    Physical Exam Updated Vital Signs Pulse (!) 107   Temp (!) 97 F (36.1 C) (Temporal)   Resp 19   SpO2 100%   Physical Exam  Constitutional: He is oriented to person, place, and time. He appears well-developed and well-nourished.  HENT:  Head: Normocephalic and atraumatic.  Eyes: Pupils are equal, round, and reactive to light. Conjunctivae and EOM are normal. Right eye exhibits no discharge. Left eye exhibits no discharge. No scleral icterus.  Neck: Normal range of motion. Neck supple. No JVD present.  Cardiovascular: Normal rate, regular rhythm and normal heart sounds.  Exam reveals no gallop and no friction rub.   No murmur heard. Pulmonary/Chest: Effort normal. No respiratory distress. He has wheezes. He has no rales. He exhibits no tenderness.  Bilateral inspiratory and expiratory wheezes Labored breathing Accessory muscle use Speaks in 1-2 word phrases  Abdominal: Soft. He  exhibits no distension and no mass. There is no tenderness. There is no rebound and no guarding.  Musculoskeletal: Normal range of motion. He exhibits no edema or tenderness.  Neurological: He is alert and oriented to person, place, and time.  Skin: Skin is warm and dry.  Psychiatric: He has a normal mood and affect. His behavior is normal. Judgment and thought content normal.  Nursing note and vitals reviewed.    ED Treatments / Results  Labs (all labs ordered are listed, but only abnormal results are displayed) Labs Reviewed  CBC WITH DIFFERENTIAL/PLATELET  BASIC METABOLIC PANEL    EKG  EKG Interpretation None       Radiology Dg Chest Port 1 View  Result Date: 06/03/2017 CLINICAL DATA:  Shortness of breath EXAM: PORTABLE CHEST 1 VIEW COMPARISON:  08/14/2013 FINDINGS: The heart size and mediastinal contours are within normal limits. Both lungs are clear. Partially visualized cervical spine  hardware. IMPRESSION: No active disease. Electronically Signed   By: Donavan Foil M.D.   On: 06/03/2017 01:08    Procedures Procedures (including critical care time) CRITICAL CARE Performed by: Montine Circle   Total critical care time: 47 minutes  Critical care time was exclusive of separately billable procedures and treating other patients.  Critical care was necessary to treat or prevent imminent or life-threatening deterioration.  Critical care was time spent personally by me on the following activities: development of treatment plan with patient and/or surrogate as well as nursing, discussions with consultants, evaluation of patient's response to treatment, examination of patient, obtaining history from patient or surrogate, ordering and performing treatments and interventions, ordering and review of laboratory studies, ordering and review of radiographic studies, pulse oximetry and re-evaluation of patient's condition.  Medications Ordered in ED Medications  ipratropium-albuterol (DUONEB) 0.5-2.5 (3) MG/3ML nebulizer solution 3 mL (not administered)  ipratropium-albuterol (DUONEB) 0.5-2.5 (3) MG/3ML nebulizer solution (not administered)  methylPREDNISolone sodium succinate (SOLU-MEDROL) 125 mg/2 mL injection 125 mg (125 mg Intravenous Given 06/03/17 0045)     Initial Impression / Assessment and Plan / ED Course  I have reviewed the triage vital signs and the nursing notes.  Pertinent labs & imaging results that were available during my care of the patient were reviewed by me and considered in my medical decision making (see chart for details).     Patient with severe asthma exacerbation. He has wheezing bilaterally. He is in significant respiratory distress, will place patient on BiPAP and reassess.  Patient given nebs and solumedrol.  Patient has been on BiPAP for several hours, received continuous nebs through BiPAP.  Is feeling much better.  Will try off BiPAP.   Patient  maintains normal O2 sat, but is still having some significant wheezing.  He also still has accessory muscle use.  I believe that the patient will need to be admitted to the hospital to be treated further.  Appreciate Dr. Claria Dice for admitting the patient.  Final Clinical Impressions(s) / ED Diagnoses   Final diagnoses:  Exacerbation of asthma, unspecified asthma severity, unspecified whether persistent    New Prescriptions New Prescriptions   No medications on file     Montine Circle, Hershal Coria 06/03/17 [id number]    Jola Schmidt, MD 06/03/17 [phone]

## 2017-06-04 ENCOUNTER — Encounter (HOSPITAL_COMMUNITY): Payer: Self-pay | Admitting: Internal Medicine

## 2017-06-04 DIAGNOSIS — Z923 Personal history of irradiation: Secondary | ICD-10-CM

## 2017-06-04 DIAGNOSIS — J9601 Acute respiratory failure with hypoxia: Secondary | ICD-10-CM

## 2017-06-04 HISTORY — DX: Personal history of irradiation: Z92.3

## 2017-06-04 LAB — BASIC METABOLIC PANEL
Anion gap: 11 (ref 5–15)
BUN: 20 mg/dL (ref 6–20)
CALCIUM: 9.9 mg/dL (ref 8.9–10.3)
CO2: 23 mmol/L (ref 22–32)
CREATININE: 0.92 mg/dL (ref 0.61–1.24)
Chloride: 103 mmol/L (ref 101–111)
GFR calc Af Amer: >60 mL/min (ref >60)
Glucose, Bld: 121 mg/dL — ABNORMAL HIGH (ref 65–99)
Potassium: 4.4 mmol/L (ref 3.5–5.1)
SODIUM: 137 mmol/L (ref 135–145)

## 2017-06-04 LAB — CBC
HCT: 44.8 % (ref 39.0–52.0)
Hemoglobin: 15.1 g/dL (ref 13.0–17.0)
MCH: 28.5 pg (ref 26.0–34.0)
MCHC: 33.7 g/dL (ref 30.0–36.0)
MCV: 84.5 fL (ref 78.0–100.0)
PLATELETS: 266 10*3/uL (ref 150–400)
RBC: 5.3 MIL/uL (ref 4.22–5.81)
RDW: 13.1 % (ref 11.5–15.5)
WBC: 3.6 10*3/uL — AB (ref 4.0–10.5)

## 2017-06-04 LAB — HIV ANTIBODY (ROUTINE TESTING W REFLEX): HIV Screen 4th Generation wRfx: NONREACTIVE

## 2017-06-04 LAB — T4, FREE: FREE T4: 1.97 ng/dL — AB (ref 0.61–1.12)

## 2017-06-04 LAB — TSH: TSH: <0.01 u[IU]/mL — ABNORMAL LOW (ref 0.350–4.500)

## 2017-06-04 MED ORDER — GUAIFENESIN ER 600 MG PO TB12
1200.0000 mg | ORAL_TABLET | Freq: Two times a day (BID) | ORAL | Status: DC
  Administered 2017-06-04 – 2017-06-05 (×3): 1200 mg via ORAL
  Filled 2017-06-04 (×3): qty 2

## 2017-06-04 MED ORDER — IPRATROPIUM-ALBUTEROL 0.5-2.5 (3) MG/3ML IN SOLN
3.0000 mL | RESPIRATORY_TRACT | Status: DC
  Administered 2017-06-04 – 2017-06-05 (×7): 3 mL via RESPIRATORY_TRACT
  Filled 2017-06-04 (×7): qty 3

## 2017-06-04 MED ORDER — METHYLPREDNISOLONE SODIUM SUCC 40 MG IJ SOLR
40.0000 mg | Freq: Two times a day (BID) | INTRAMUSCULAR | Status: DC
  Administered 2017-06-04 – 2017-06-05 (×2): 40 mg via INTRAVENOUS
  Filled 2017-06-04 (×2): qty 1

## 2017-06-04 NOTE — Progress Notes (Signed)
Pt was off BIPAP on2 lpm (trialing per RN)  Upon entering room,pt requesting to be placed back on. Off approximately 5 min. Placed back on previous settings.

## 2017-06-04 NOTE — Progress Notes (Signed)
PROGRESS NOTE    Peter [name]   JAS:[id number]  DOB: 11-May-1951  DOA: 06/03/2017 PCP: Shon Baton, MD   Brief Narrative:  Peter [name] is a 66 y/o with h/o Graves s/p ablation, Asthma which is well controlled at baseline, OSA (could not tolerate CPAP a couple of years ago) who presents in acute resp distress due to asthma exacerbation. Has had increased in cough and dyspnea for about 1 wk. No yellow/ green sputum or fevers. No runny nose or sore throat.    Subjective: Feels better today but still gets short of breath episodically. Cough unchanged and sputum is hard to bring up. No new symptoms.  ROS: no complaints of nausea, vomiting, constipation diarrhea, cough, dyspnea or dysuria. No other complaints.   Assessment & Plan:   Principal Problem:   Asthma exacerbation - weaned to room air by myself this AM but still wheezing- add Mucinex and flutter valve - wean Solumedrol- wean  - SNVs  Active Problems:   HTN (hypertension) - Avapro- hold Lasix  For now    Hypothyroidism - S/P radioactive iodine thyroid ablation- for Graves disease - cont synthroid- TSH checked on admission is low as expected    Complex sleep apnea syndrome - has not used his CPAP in years because he was unable to tolerate it - needs a new sleep study now- have advised him to speak with his PCP about it    Cardiomyopathy ([place]) - EF 15 % in 6/14- I don't see where and ECHO has been repeated in the EPIC system - PCP to follow - compensated   DVT prophylaxis:  Lovenox Code Status: full code Family Communication:  Disposition Plan: home tomorrow Consultants:    Procedures:    Antimicrobials:  Anti-infectives    None       Objective: Vitals:   06/04/17 0407 06/04/17 0600 06/04/17 0756 06/04/17 0818  BP: (!) 139/97     Pulse: 84   82  Resp: 19   18  Temp: 97.6 F (36.4 C)   98 F (36.7 C)  TempSrc: Oral   Oral  SpO2: 100%  100% 100%  Weight:  95.3 kg (210 lb)    Height:         Intake/Output Summary (Last 24 hours) at 06/04/17 1034 Last data filed at 06/04/17 1000  Gross per 24 hour  Intake              240 ml  Output              350 ml  Net             -110 ml   Filed Weights   06/03/17 1419 06/04/17 0600  Weight: 95.4 kg (210 lb 6 oz) 95.3 kg (210 lb)    Examination: General exam: Appears comfortable  HEENT: PERRLA, oral mucosa moist, no sclera icterus or thrush Respiratory system: b/l wheezing-  Respiratory effort normal. Cardiovascular system: S1 & S2 heard, RRR.  No murmurs  Gastrointestinal system: Abdomen soft, non-tender, nondistended. Normal bowel sound. No organomegaly Central nervous system: Alert and oriented. No focal neurological deficits. Extremities: No cyanosis, clubbing or edema Skin: No rashes or ulcers Psychiatry:  Mood & affect appropriate.     Data Reviewed: I have personally reviewed following labs and imaging studies  CBC:  Recent Labs Lab 06/03/17 0432 06/03/17 0905 06/04/17 0156  WBC 9.3 4.5 3.6*  NEUTROABS 8.4*  --   --   HGB 15.7 14.2 15.1  HCT 46.5  42.2 44.8  MCV 85.6 84.7 84.5  PLT 201 239 025   Basic Metabolic Panel:  Recent Labs Lab 06/03/17 0432 06/03/17 0905 06/04/17 0156  NA 141  --  137  K 4.2  --  4.4  CL 108  --  103  CO2 20*  --  23  GLUCOSE 103*  --  121*  BUN 15  --  20  CREATININE 0.83 0.89 0.92  CALCIUM 9.5  --  9.9   GFR: Estimated Creatinine Clearance: 93.1 mL/min (by C-G formula based on SCr of 0.92 mg/dL). Liver Function Tests: No results for input(s): AST, ALT, ALKPHOS, BILITOT, PROT, ALBUMIN in the last 168 hours. No results for input(s): LIPASE, AMYLASE in the last 168 hours. No results for input(s): AMMONIA in the last 168 hours. Coagulation Profile: No results for input(s): INR, PROTIME in the last 168 hours. Cardiac Enzymes: No results for input(s): CKTOTAL, CKMB, CKMBINDEX, TROPONINI in the last 168 hours. BNP (last 3 results) No results for input(s): PROBNP in  the last 8760 hours. HbA1C: No results for input(s): HGBA1C in the last 72 hours. CBG: No results for input(s): GLUCAP in the last 168 hours. Lipid Profile: No results for input(s): CHOL, HDL, LDLCALC, TRIG, CHOLHDL, LDLDIRECT in the last 72 hours. Thyroid Function Tests:  Recent Labs  06/03/17 0900  TSH <0.010*   Anemia Panel: No results for input(s): VITAMINB12, FOLATE, FERRITIN, TIBC, IRON, RETICCTPCT in the last 72 hours. Urine analysis:    Component Value Date/Time   COLORURINE YELLOW 12/29/2013 2100   APPEARANCEUR CLEAR 12/29/2013 2100   LABSPEC 1.022 12/29/2013 2100   PHURINE 8.0 12/29/2013 2100   GLUCOSEU NEGATIVE 12/29/2013 2100   HGBUR NEGATIVE 12/29/2013 2100   [place] NEGATIVE 12/29/2013 2100   [place] NEGATIVE 12/29/2013 2100   PROTEINUR NEGATIVE 12/29/2013 2100   UROBILINOGEN 0.2 12/29/2013 2100   NITRITE NEGATIVE 12/29/2013 2100   LEUKOCYTESUR NEGATIVE 12/29/2013 2100   Sepsis Labs: @LABRCNTIP (procalcitonin:4,lacticidven:4) )No results found for this or any previous visit (from the past 240 hour(s)).       Radiology Studies: Dg Chest Port 1 View  Result Date: 06/03/2017 CLINICAL DATA:  Shortness of breath EXAM: PORTABLE CHEST 1 VIEW COMPARISON:  08/14/2013 FINDINGS: The heart size and mediastinal contours are within normal limits. Both lungs are clear. Partially visualized cervical spine hardware. IMPRESSION: No active disease. Electronically Signed   By: Donavan Foil M.D.   On: 06/03/2017 01:08      Scheduled Meds: . docusate sodium  100 mg Oral BID  . enoxaparin (LOVENOX) injection  40 mg Subcutaneous Q24H  . guaiFENesin  1,200 mg Oral BID  . ipratropium-albuterol  3 mL Nebulization Q4H WA  . irbesartan  75 mg Oral Daily  . levothyroxine  200 mcg Oral QAC breakfast  . loratadine  10 mg Oral Daily  . methylPREDNISolone (SOLU-MEDROL) injection  40 mg Intravenous TID  . montelukast  10 mg Oral QHS  . pantoprazole  40 mg Oral Q0600    Continuous Infusions:   LOS: 1 day    Time spent in minutes: [place], MD [facility] Pager: [email or web address] Password [email or web address] 06/04/2017, 10:34 AM

## 2017-06-05 ENCOUNTER — Encounter (HOSPITAL_COMMUNITY): Payer: Self-pay

## 2017-06-05 DIAGNOSIS — Z923 Personal history of irradiation: Secondary | ICD-10-CM

## 2017-06-05 MED ORDER — LEVOTHYROXINE SODIUM 75 MCG PO TABS
150.0000 ug | ORAL_TABLET | Freq: Every day | ORAL | Status: DC
  Administered 2017-06-05: 150 ug via ORAL
  Filled 2017-06-05: qty 2

## 2017-06-05 MED ORDER — ALBUTEROL SULFATE (2.5 MG/3ML) 0.083% IN NEBU: 2.5000 mg | INHALATION_SOLUTION | RESPIRATORY_TRACT | 12 refills | Status: DC | PRN

## 2017-06-05 MED ORDER — IPRATROPIUM-ALBUTEROL 0.5-2.5 (3) MG/3ML IN SOLN: 3.0000 mL | RESPIRATORY_TRACT | 0 refills | Status: AC

## 2017-06-05 MED ORDER — PREDNISONE 10 MG PO TABS: 60.0000 mg | ORAL_TABLET | Freq: Every day | ORAL | 0 refills | Status: DC

## 2017-06-05 MED ORDER — GUAIFENESIN ER 600 MG PO TB12: 1200.0000 mg | ORAL_TABLET | Freq: Two times a day (BID) | ORAL | 0 refills | Status: DC

## 2017-06-05 NOTE — Progress Notes (Signed)
Patient is requesting duoneb prescription at discharge, he currently is on albuterol nebs at home.

## 2017-06-05 NOTE — Discharge Summary (Addendum)
Physician Discharge Summary  Peter [name] DPO:[id number] DOB: 1951/07/12 DOA: 06/03/2017  PCP: Shon Baton, MD  Admit date: 06/03/2017 Discharge date: 06/05/2017  Admitted From: home  Disposition:  home   Recommendations for Outpatient Follow-up:  1. Needs a new sleep study 2. Needs synthroid dose adjusted  Discharge Condition:  stable   CODE STATUS:  Full code   Consultations:  none   Discharge Diagnoses:  Principal Problem:   Asthma exacerbation Active Problems:   HTN (hypertension)   Hypothyroidism   Complex sleep apnea syndrome   Cardiomyopathy - resolved   S/P radioactive iodine thyroid ablation- for Graves disease    Subjective: Dyspnea resolved. No cough. He states that the episode last night with likely more anxiety than asthma.   Brief Summary: Peter [name] is a 66 y/o with h/o Graves s/p ablation, Asthma which is well controlled at baseline, OSA (could not tolerate CPAP a couple of years ago) who presents in acute resp distress due to asthma exacerbation. Has had increased in cough and dyspnea for about 1 wk. No yellow/ green sputum or fevers. No runny nose or sore throat.   Hospital Course:  Principal Problem:   Asthma exacerbation - improving- trigger may be seasonal allergies- no acute infection - lungs clear on exam- no longer hypoxic - d/c home on Prednisone taper, Duonebs (previuosly only on Albuterol), Mucinex and flutter valve    Active Problems:   HTN (hypertension) - Avapro & Lasix       Hypothyroidism - S/P radioactive iodine thyroid ablation- for Graves disease - cont synthroid- TSH checked on admission is low  With high free T4- - TSH < 0.010, TSH 1.97 -  have discussed this with him- he states his PCP will occasionally tell him to simply hold Synthroid and then resume it later- I have asked him to discuss it with his PCP    Complex sleep apnea syndrome - has not used his CPAP in years because he was unable to tolerate it - needs  a new sleep study now- have advised him to speak with his PCP about it    Cardiomyopathy ([place]) - EF 15 % in 6/14- I don't see where and ECHO has been repeated in the EPIC system - PCP to follow - compensated    Discharge Instructions  Discharge Instructions    Diet - low sodium heart healthy    Complete by:  As directed    Increase activity slowly    Complete by:  As directed      Allergies as of 06/05/2017      Reactions   Keflex [cephalexin] Anaphylaxis      Medication List    TAKE these medications   cetirizine 10 MG tablet Commonly known as:  ZYRTEC Take 10 mg by mouth daily.   docusate sodium 100 MG capsule Commonly known as:  COLACE Take 200 mg by mouth daily.   Fish Oil 1000 MG Caps Take 3,000 mg by mouth daily.   furosemide 20 MG tablet Commonly known as:  LASIX Take 1 tablet (20 mg total) by mouth daily. What changed:  when to take this  reasons to take this   guaiFENesin 600 MG 12 hr tablet Commonly known as:  MUCINEX Take 2 tablets (1,200 mg total) by mouth 2 (two) times daily.   ipratropium-albuterol 0.5-2.5 (3) MG/3ML Soln Commonly known as:  DUONEB Take 3 mLs by nebulization every 4 (four) hours.   irbesartan 75 MG tablet Commonly known as:  AVAPRO Take  75 mg by mouth daily.   levothyroxine 200 MCG tablet Commonly known as:  SYNTHROID, LEVOTHROID Take 200 mcg by mouth daily before breakfast.   montelukast 10 MG tablet Commonly known as:  SINGULAIR Take 10 mg by mouth at bedtime.   oxyCODONE 5 MG immediate release tablet Commonly known as:  ROXICODONE Take 1 tablet (5 mg total) by mouth every 4 (four) hours as needed for severe pain.   predniSONE 10 MG tablet Commonly known as:  DELTASONE Take 6 tablets (60 mg total) by mouth daily with breakfast. Take 6 tabs tomorrow and then decrease by 1 tab daily until finished   VENTOLIN HFA 108 (90 Base) MCG/ACT inhaler Generic drug:  albuterol Inhale 2 puffs into the lungs every 4  (four) hours as needed. What changed:  Another medication with the same name was changed. Make sure you understand how and when to take each.   albuterol (2.5 MG/3ML) 0.083% nebulizer solution Commonly known as:  PROVENTIL Take 3 mLs (2.5 mg total) by nebulization every 2 (two) hours as needed for wheezing or shortness of breath (Albuterol Nebs used for Inhaler per [place]). What changed:  when to take this            Discharge Care Instructions        Start     Ordered   06/05/17 0000  albuterol (PROVENTIL) (2.5 MG/3ML) 0.083% nebulizer solution  Every 2 hours PRN     06/05/17 0813   06/05/17 0000  guaiFENesin (MUCINEX) 600 MG 12 hr tablet  2 times daily     06/05/17 0813   06/05/17 0000  ipratropium-albuterol (DUONEB) 0.5-2.5 (3) MG/3ML SOLN  Every 4 hours     06/05/17 0813   06/05/17 0000  predniSONE (DELTASONE) 10 MG tablet  Daily with breakfast     06/05/17 0813   06/05/17 0000  Increase activity slowly     06/05/17 0813   06/05/17 0000  Diet - low sodium heart healthy     06/05/17 0813     Follow-up Information    Shon Baton, MD Follow up in 1 week(s).   Specialty:  Internal Medicine Contact information: [place] [phone] [phone]          Allergies  Allergen Reactions  . Keflex [Cephalexin] Anaphylaxis     Procedures/Studies:    Dg Chest Port 1 View  Result Date: 06/03/2017 CLINICAL DATA:  Shortness of breath EXAM: PORTABLE CHEST 1 VIEW COMPARISON:  08/14/2013 FINDINGS: The heart size and mediastinal contours are within normal limits. Both lungs are clear. Partially visualized cervical spine hardware. IMPRESSION: No active disease. Electronically Signed   By: Donavan Foil M.D.   On: 06/03/2017 01:08       Discharge Exam: Vitals:   06/05/17 0900 06/05/17 1124  BP:    Pulse:    Resp:    Temp: 97.8 F (36.6 C)   SpO2:  100%   Vitals:   06/05/17 0531 06/05/17 0753 06/05/17 0900 06/05/17 1124  BP: (!)  165/90     Pulse: 98     Resp: (!) 24     Temp: 98.6 F (37 C)  97.8 F (36.6 C)   TempSrc: Oral  Axillary   SpO2: 98% 99%  100%  Weight: 94.2 kg (207 lb 11.2 oz)     Height:        General: Pt is alert, awake, not in acute distress Cardiovascular: RRR, S1/S2 +, no rubs, no  gallops Respiratory: CTA bilaterally, no wheezing, no rhonchi Abdominal: Soft, NT, ND, bowel sounds + Extremities: no edema, no cyanosis    The results of significant diagnostics from this hospitalization (including imaging, microbiology, ancillary and laboratory) are listed below for reference.     Microbiology: No results found for this or any previous visit (from the past 240 hour(s)).   Labs: BNP (last 3 results) No results for input(s): BNP in the last 8760 hours. Basic Metabolic Panel:  Recent Labs Lab 06/03/17 0432 06/03/17 0905 06/04/17 0156  NA 141  --  137  K 4.2  --  4.4  CL 108  --  103  CO2 20*  --  23  GLUCOSE 103*  --  121*  BUN 15  --  20  CREATININE 0.83 0.89 0.92  CALCIUM 9.5  --  9.9   Liver Function Tests: No results for input(s): AST, ALT, ALKPHOS, BILITOT, PROT, ALBUMIN in the last 168 hours. No results for input(s): LIPASE, AMYLASE in the last 168 hours. No results for input(s): AMMONIA in the last 168 hours. CBC:  Recent Labs Lab 06/03/17 0432 06/03/17 0905 06/04/17 0156  WBC 9.3 4.5 3.6*  NEUTROABS 8.4*  --   --   HGB 15.7 14.2 15.1  HCT 46.5 42.2 44.8  MCV 85.6 84.7 84.5  PLT 201 239 266   Cardiac Enzymes: No results for input(s): CKTOTAL, CKMB, CKMBINDEX, TROPONINI in the last 168 hours. BNP: Invalid input(s): POCBNP CBG: No results for input(s): GLUCAP in the last 168 hours. D-Dimer No results for input(s): DDIMER in the last 72 hours. Hgb A1c No results for input(s): HGBA1C in the last 72 hours. Lipid Profile No results for input(s): CHOL, HDL, LDLCALC, TRIG, CHOLHDL, LDLDIRECT in the last 72 hours. Thyroid function studies  Recent Labs   06/04/17 0742  TSH <0.010*   Anemia work up No results for input(s): VITAMINB12, FOLATE, FERRITIN, TIBC, IRON, RETICCTPCT in the last 72 hours. Urinalysis    Component Value Date/Time   COLORURINE YELLOW 12/29/2013 2100   APPEARANCEUR CLEAR 12/29/2013 2100   LABSPEC 1.022 12/29/2013 2100   PHURINE 8.0 12/29/2013 2100   GLUCOSEU NEGATIVE 12/29/2013 2100   [place] NEGATIVE 12/29/2013 2100   [place] NEGATIVE 12/29/2013 2100   [place] NEGATIVE 12/29/2013 2100   PROTEINUR NEGATIVE 12/29/2013 2100   UROBILINOGEN 0.2 12/29/2013 2100   NITRITE NEGATIVE 12/29/2013 2100   LEUKOCYTESUR NEGATIVE 12/29/2013 2100   Sepsis Labs Invalid input(s): PROCALCITONIN,  WBC,  LACTICIDVEN Microbiology No results found for this or any previous visit (from the past 240 hour(s)).   Time coordinating discharge: Over 30 minutes  SIGNED:   Debbe Odea, MD  [facility] 06/05/2017, 12:02 PM Pager   If 7PM-7AM, please contact night-coverage [email or web address] Password [email or web address]

## 2017-06-05 NOTE — Progress Notes (Signed)
Patient at 0000 06/05/17 called out complaining of shortness of breath. Upon assessment patients respirations 40/min. Heart rate 130's. O2 saturations 100% on room air. Patient complained of abrupt "seizing of [his] lungs like [he has] never experienced before". Scheduled breathing treatment administered to good effect. Respiratory therapist contacted, and patient placed on Bi-PAP per RT recommendation. Patient resting in bed. Heart rate 90's - low 100's, respirations 18-20, O2 sat 100%.   BP: 183/99 at 2326 06/04/17. IV hydralazine administered. Now 155/92.

## 2017-06-05 NOTE — Discharge Instructions (Signed)
TSH < 0.010         T4,Free(Direct)      1.97   Please address does of thyroid medication with your PCP  Please take all your medications with you for your next visit with your Primary MD. Please request your Primary MD to go over all hospital test results at the follow up. Please ask your Primary MD to get all Hospital records sent to his/her office.  If you experience worsening of your admission symptoms, develop shortness of breath, chest pain, suicidal or homicidal thoughts or a life threatening emergency, you must seek medical attention immediately by calling 911 or calling your MD.  Dennis Bast must read the complete instructions/literature along with all the possible adverse reactions/side effects for all the medicines you take including new medications that have been prescribed to you. Take new medicines after you have completely understood and accpet all the possible adverse reactions/side effects.   Do not drive when taking pain medications or sedatives.    Do not take more than prescribed Pain, Sleep and Anxiety Medications  If you have smoked or chewed Tobacco in the last 2 yrs please stop. Stop any regular alcohol and or recreational drug use.  Wear Seat belts while driving.   Asthma Attack Prevention, Adult Although you may not be able to control the fact that you have asthma, you can take actions to prevent episodes of asthma (asthma attacks). These actions include:  Creating a written plan for managing and treating your asthma attacks (asthma action plan).  Monitoring your asthma.  Avoiding things that can irritate your airways or make your asthma symptoms worse (asthma triggers).  Taking your medicines as directed.  Acting quickly if you have signs or symptoms of an asthma attack.  What are some ways to prevent an asthma attack? Create a plan Work with your health care provider to create an asthma action plan. This plan should include:  A list of your asthma  triggers and how to avoid them.  A list of symptoms that you experience during an asthma attack.  Information about when to take medicine and how much medicine to take.  Information to help you understand your peak flow measurements.  Contact information for your health care providers.  Daily actions that you can take to control asthma.  Monitor your asthma  To monitor your asthma:  Use your peak flow meter every morning and every evening for 2-3 weeks. Record the results in a journal. A drop in your peak flow numbers on one or more days may mean that you are starting to have an asthma attack, even if you are not having symptoms.  When you have asthma symptoms, write them down in a journal.  Avoid asthma triggers  Work with your health care provider to find out what your asthma triggers are. This can be done by:  Being tested for allergies.  Keeping a journal that notes when asthma attacks occur and what may have contributed to them.  Asking your health care provider whether other medical conditions make your asthma worse.  Common asthma triggers include:  Dust.  Smoke. This includes campfire smoke and secondhand smoke from tobacco products.  Pet dander.  Trees, grasses or pollens.  Very cold, dry, or humid air.  Mold.  Foods that contain high amounts of sulfites.  Strong smells.  Engine exhaust and air pollution.  Aerosol sprays and fumes from household cleaners.  Household pests and their droppings, including dust mites and cockroaches.  Certain medicines, including NSAIDs.  Once you have determined your asthma triggers, take steps to avoid them. Depending on your triggers, you may be able to reduce the chance of an asthma attack by:  Keeping your home clean. Have someone dust and vacuum your home for you 1 or 2 times a week. If possible, have them use a high-efficiency particulate arrestance (HEPA) vacuum.  Washing your sheets weekly in hot  water.  Using allergy-proof mattress covers and casings on your bed.  Keeping pets out of your home.  Taking care of mold and water problems in your home.  Avoiding areas where people smoke.  Avoiding using strong perfumes or odor sprays.  Avoid spending a lot of time outdoors when pollen counts are high and on very windy days.  Talking with your health care provider before stopping or starting any new medicines.  Medicines Take over-the-counter and prescription medicines only as told by your health care provider. Many asthma attacks can be prevented by carefully following your medicine schedule. Taking your medicines correctly is especially important when you cannot avoid certain asthma triggers. Even if you are doing well, do not stop taking your medicine and do not take less medicine. Act quickly If an asthma attack happens, acting quickly can decrease how severe it is and how long it lasts. Take these actions:  Pay attention to your symptoms. If you are coughing, wheezing, or having difficulty breathing, do not wait to see if your symptoms go away on their own. Follow your asthma action plan.  If you have followed your asthma action plan and your symptoms are not improving, call your health care provider or seek immediate medical care at the nearest hospital.  It is important to write down how often you need to use your fast-acting rescue inhaler. You can track how often you use an inhaler in your journal. If you are using your rescue inhaler more often, it may mean that your asthma is not under control. Adjusting your asthma treatment plan may help you to prevent future asthma attacks and help you to gain better control of your condition. How can I prevent an asthma attack when I exercise?  Exercise is a common asthma trigger. To prevent asthma attacks during exercise:  Follow advice from your health care provider about whether you should use your fast-acting inhaler before  exercising. Many people with asthma experience exercise-induced bronchoconstriction (EIB). This condition often worsens during vigorous exercise in cold, humid, or dry environments. Usually, people with EIB can stay very active by using a fast-acting inhaler before exercising.  Avoid exercising outdoors in very cold or humid weather.  Avoid exercising outdoors when pollen counts are high.  Warm up and cool down when exercising.  Stop exercising right away if asthma symptoms start.  Consider taking part in exercises that are less likely to cause asthma symptoms such as:  Indoor swimming.  Biking.  Walking.  Hiking.  Playing football.  This information is not intended to replace advice given to you by your health care provider. Make sure you discuss any questions you have with your health care provider. Document Released: 08/30/2009 Document Revised: 05/12/2016 Document Reviewed: 02/26/2016 Elsevier Interactive Patient Education  2017 [place].   Asthma, Adult Asthma is a condition of the lungs in which the airways tighten and narrow. Asthma can make it hard to breathe. Asthma cannot be cured, but medicine and lifestyle changes can help control it. Asthma may be started (triggered) by:  Animal skin flakes (  dander).  Dust.  Cockroaches.  Pollen.  Mold.  Smoke.  Cleaning products.  Hair sprays or aerosol sprays.  Paint fumes or strong smells.  Cold air, weather changes, and winds.  Crying or laughing hard.  Stress.  Certain medicines or drugs.  Foods, such as dried fruit, potato chips, and sparkling grape juice.  Infections or conditions (colds, flu).  Exercise.  Certain medical conditions or diseases.  Exercise or tiring activities.  Follow these instructions at home:  Take medicine as told by your doctor.  Use a peak flow meter as told by your doctor. A peak flow meter is a tool that measures how well the lungs are working.  Record and keep  track of the peak flow meter's readings.  Understand and use the asthma action plan. An asthma action plan is a written plan for taking care of your asthma and treating your attacks.  To help prevent asthma attacks: ? Do not smoke. Stay away from secondhand smoke. ? Change your heating and air conditioning filter often. ? Limit your use of fireplaces and wood stoves. ? Get rid of pests (such as roaches and mice) and their droppings. ? Throw away plants if you see mold on them. ? Clean your floors. Dust regularly. Use cleaning products that do not smell. ? Have someone vacuum when you are not home. Use a vacuum cleaner with a HEPA filter if possible. ? Replace carpet with wood, tile, or vinyl flooring. Carpet can trap animal skin flakes and dust. ? Use allergy-proof pillows, mattress covers, and box spring covers. ? Wash bed sheets and blankets every week in hot water and dry them in a dryer. ? Use blankets that are made of polyester or cotton. ? Clean bathrooms and kitchens with bleach. If possible, have someone repaint the walls in these rooms with mold-resistant paint. Keep out of the rooms that are being cleaned and painted. ? Wash hands often. Contact a doctor if:  You have make a whistling sound when breaking (wheeze), have shortness of breath, or have a cough even if taking medicine to prevent attacks.  The colored mucus you cough up (sputum) is thicker than usual.  The colored mucus you cough up changes from clear or white to yellow, green, gray, or bloody.  You have problems from the medicine you are taking such as: ? A rash. ? Itching. ? Swelling. ? Trouble breathing.  You need reliever medicines more than 2-3 times a week.  Your peak flow measurement is still at 50-79% of your personal best after following the action plan for 1 hour.  You have a fever. Get help right away if:  You seem to be worse and are not responding to medicine during an asthma attack.  You are  short of breath even at rest.  You get short of breath when doing very little activity.  You have trouble eating, drinking, or talking.  You have chest pain.  You have a fast heartbeat.  Your lips or fingernails start to turn blue.  You are light-headed, dizzy, or faint.  Your peak flow is less than 50% of your personal best. This information is not intended to replace advice given to you by your health care provider. Make sure you discuss any questions you have with your health care provider. Document Released: 02/28/2008 Document Revised: 02/17/2016 Document Reviewed: 04/10/2013 Elsevier Interactive Patient Education  2017 [place] Heart-healthy meal planning includes:  Limiting unhealthy fats.  Increasing healthy  fats.  Making other small dietary changes.  You may need to talk with your doctor or a diet specialist (dietitian) to create an eating plan that is right for you. What types of fat should I choose?  Choose healthy fats. These include olive oil and canola oil, flaxseeds, walnuts, almonds, and seeds.  Eat more omega-3 fats. These include salmon, mackerel, sardines, tuna, flaxseed oil, and ground flaxseeds. Try to eat fish at least twice each week.  Limit saturated fats. ? Saturated fats are often found in animal products, such as meats, butter, and cream. ? Plant sources of saturated fats include palm oil, palm kernel oil, and coconut oil.  Avoid foods with partially hydrogenated oils in them. These include stick margarine, some tub margarines, cookies, crackers, and other baked goods. These contain trans fats. What general guidelines do I need to follow?  Check food labels carefully. Identify foods with trans fats or high amounts of saturated fat.  Fill one half of your plate with vegetables and green salads. Eat 4-5 servings of vegetables per day. A serving of vegetables is: ? 1 cup of raw leafy vegetables. ?  cup of raw or  cooked cut-up vegetables. ?  cup of vegetable juice.  Fill one fourth of your plate with whole grains. Look for the word "whole" as the first word in the ingredient list.  Fill one fourth of your plate with lean protein foods.  Eat 4-5 servings of fruit per day. A serving of fruit is: ? One medium whole fruit. ?  cup of dried fruit. ?  cup of fresh, frozen, or canned fruit. ?  cup of 100% fruit juice.  Eat more foods that contain soluble fiber. These include apples, broccoli, carrots, beans, peas, and barley. Try to get 20-30 g of fiber per day.  Eat more home-cooked food. Eat less restaurant, buffet, and fast food.  Limit or avoid alcohol.  Limit foods high in starch and sugar.  Avoid fried foods.  Avoid frying your food. Try baking, boiling, grilling, or broiling it instead. You can also reduce fat by: ? Removing the skin from poultry. ? Removing all visible fats from meats. ? Skimming the fat off of stews, soups, and gravies before serving them. ? Steaming vegetables in water or broth.  Lose weight if you are overweight.  Eat 4-5 servings of nuts, legumes, and seeds per week: ? One serving of dried beans or legumes equals  cup after being cooked. ? One serving of nuts equals 1 ounces. ? One serving of seeds equals  ounce or one tablespoon.  You may need to keep track of how much salt or sodium you eat. This is especially true if you have high blood pressure. Talk with your doctor or dietitian to get more information. What foods can I eat? Grains Breads, including [place], white, pita, wheat, raisin, rye, oatmeal, and [place]. Tortillas that are neither fried nor made with lard or trans fat. Low-fat rolls, including hotdog and hamburger buns and English muffins. Biscuits. Muffins. Waffles. Pancakes. Light popcorn. Whole-grain cereals. Flatbread. Melba toast. Pretzels. Breadsticks. Rusks. Low-fat snacks. Low-fat crackers, including oyster, saltine, matzo, graham, animal,  and rye. Rice and pasta, including brown rice and pastas that are made with whole wheat. Vegetables All vegetables. Fruits All fruits, but limit coconut. Meats and Other Protein Sources Lean, well-trimmed beef, veal, pork, and lamb. Chicken and [place] without skin. All fish and shellfish. Wild duck, rabbit, pheasant, and venison. Egg whites or low-cholesterol egg substitutes. Dried  beans, peas, lentils, and tofu. Seeds and most nuts. Dairy Low-fat or nonfat cheeses, including ricotta, string, and mozzarella. Skim or 1% milk that is liquid, powdered, or evaporated. Buttermilk that is made with low-fat milk. Nonfat or low-fat yogurt. Beverages Mineral water. Diet carbonated beverages. Sweets and Desserts Sherbets and fruit ices. Honey, jam, marmalade, jelly, and syrups. Meringues and gelatins. Pure sugar candy, such as hard candy, jelly beans, gumdrops, mints, marshmallows, and small amounts of dark chocolate. [employer] Eat all sweets and desserts in moderation. Fats and Oils Nonhydrogenated (trans-free) margarines. Vegetable oils, including soybean, sesame, sunflower, olive, peanut, safflower, corn, canola, and cottonseed. Salad dressings or mayonnaise made with a vegetable oil. Limit added fats and oils that you use for cooking, baking, salads, and as spreads. Other Cocoa powder. Coffee and tea. All seasonings and condiments. The items listed above may not be a complete list of recommended foods or beverages. Contact your dietitian for more options. What foods are not recommended? Grains Breads that are made with saturated or trans fats, oils, or whole milk. Croissants. Butter rolls. Cheese breads. Sweet rolls. Donuts. Buttered popcorn. Chow mein noodles. High-fat crackers, such as cheese or butter crackers. Meats and Other Protein Sources Fatty meats, such as hotdogs, short ribs, sausage, spareribs, bacon, rib eye roast or steak, and mutton. High-fat deli meats, such as salami and  bologna. Caviar. Domestic duck and goose. Organ meats, such as kidney, liver, sweetbreads, and heart. Dairy Cream, sour cream, cream cheese, and creamed cottage cheese. Whole-milk cheeses, including blue (bleu), Monte[place] Jack, [place], [place], American, [place], Swiss, cheddar, [place], and [place]. Whole or 2% milk that is liquid, evaporated, or condensed. Whole buttermilk. Cream sauce or high-fat cheese sauce. Yogurt that is made from whole milk. Beverages Regular sodas and juice drinks with added sugar. Sweets and Desserts Frosting. Pudding. Cookies. Cakes other than angel food cake. Candy that has milk chocolate or white chocolate, hydrogenated fat, butter, coconut, or unknown ingredients. Buttered syrups. Full-fat ice cream or ice cream drinks. Fats and Oils Gravy that has suet, meat fat, or shortening. Cocoa butter, hydrogenated oils, palm oil, coconut oil, palm kernel oil. These can often be found in baked products, candy, fried foods, nondairy creamers, and whipped toppings. Solid fats and shortenings, including bacon fat, salt pork, lard, and butter. Nondairy cream substitutes, such as coffee creamers and sour cream substitutes. Salad dressings that are made of unknown oils, cheese, or sour cream. The items listed above may not be a complete list of foods and beverages to avoid. Contact your dietitian for more information. This information is not intended to replace advice given to you by your health care provider. Make sure you discuss any questions you have with your health care provider. Document Released: 03/12/2012 Document Revised: 02/17/2016 Document Reviewed: 03/05/2014 Elsevier Interactive Patient Education  [employer].

## 2017-06-07 NOTE — Consult Note (Signed)
           [facility] CM Primary Care Navigator  06/07/2017  [name] 1951-02-10 [id number]   Attempt to seepatient at the bedsideto identify possible discharge needs but he was already discharged per staff report. Patient was discharged home.  Patient has a discharge instruction to follow-up with primary care provider in a week.   For questions, please contact:  Dannielle Huh, BSN, RN- [facility] Primary Care Navigator  Telephone: [phone] [place]

## 2017-06-14 DIAGNOSIS — E038 Other specified hypothyroidism: Secondary | ICD-10-CM | POA: Diagnosis not present

## 2017-07-02 DIAGNOSIS — I5022 Chronic systolic (congestive) heart failure: Secondary | ICD-10-CM | POA: Diagnosis not present

## 2017-07-02 DIAGNOSIS — G4733 Obstructive sleep apnea (adult) (pediatric): Secondary | ICD-10-CM | POA: Diagnosis not present

## 2017-07-02 DIAGNOSIS — R972 Elevated prostate specific antigen [PSA]: Secondary | ICD-10-CM | POA: Diagnosis not present

## 2017-07-02 DIAGNOSIS — E038 Other specified hypothyroidism: Secondary | ICD-10-CM | POA: Diagnosis not present

## 2017-07-02 DIAGNOSIS — J45998 Other asthma: Secondary | ICD-10-CM | POA: Diagnosis not present

## 2017-07-02 DIAGNOSIS — Z6828 Body mass index (BMI) 28.0-28.9, adult: Secondary | ICD-10-CM | POA: Diagnosis not present

## 2017-07-02 DIAGNOSIS — R7309 Other abnormal glucose: Secondary | ICD-10-CM | POA: Diagnosis not present

## 2017-07-02 DIAGNOSIS — Z23 Encounter for immunization: Secondary | ICD-10-CM | POA: Diagnosis not present

## 2017-07-02 DIAGNOSIS — N179 Acute kidney failure, unspecified: Secondary | ICD-10-CM | POA: Diagnosis not present

## 2017-07-02 DIAGNOSIS — I130 Hypertensive heart and chronic kidney disease with heart failure and stage 1 through stage 4 chronic kidney disease, or unspecified chronic kidney disease: Secondary | ICD-10-CM | POA: Diagnosis not present

## 2017-07-02 DIAGNOSIS — Z1389 Encounter for screening for other disorder: Secondary | ICD-10-CM | POA: Diagnosis not present

## 2017-07-04 DIAGNOSIS — I420 Dilated cardiomyopathy: Secondary | ICD-10-CM | POA: Diagnosis not present

## 2017-07-04 DIAGNOSIS — G4733 Obstructive sleep apnea (adult) (pediatric): Secondary | ICD-10-CM | POA: Diagnosis not present

## 2017-07-04 DIAGNOSIS — J45901 Unspecified asthma with (acute) exacerbation: Secondary | ICD-10-CM | POA: Diagnosis not present

## 2017-07-04 DIAGNOSIS — I10 Essential (primary) hypertension: Secondary | ICD-10-CM | POA: Diagnosis not present

## 2017-07-04 DIAGNOSIS — E038 Other specified hypothyroidism: Secondary | ICD-10-CM | POA: Diagnosis not present

## 2017-07-04 DIAGNOSIS — Z6828 Body mass index (BMI) 28.0-28.9, adult: Secondary | ICD-10-CM | POA: Diagnosis not present

## 2017-07-09 NOTE — Telephone Encounter (Signed)
Closing Encounter.

## 2017-07-13 DIAGNOSIS — I878 Other specified disorders of veins: Secondary | ICD-10-CM | POA: Diagnosis not present

## 2017-07-13 DIAGNOSIS — I10 Essential (primary) hypertension: Secondary | ICD-10-CM | POA: Diagnosis not present

## 2017-07-13 DIAGNOSIS — I5032 Chronic diastolic (congestive) heart failure: Secondary | ICD-10-CM | POA: Diagnosis not present

## 2017-07-16 ENCOUNTER — Encounter: Payer: Self-pay | Admitting: Neurology

## 2017-07-17 ENCOUNTER — Telehealth: Payer: Self-pay | Admitting: Neurology

## 2017-07-17 ENCOUNTER — Institutional Professional Consult (permissible substitution): Payer: 59 | Admitting: Neurology

## 2017-07-17 NOTE — Telephone Encounter (Signed)
Pt no showed apt today 

## 2017-07-18 ENCOUNTER — Encounter: Payer: Self-pay | Admitting: Neurology

## 2017-07-30 DIAGNOSIS — K5903 Drug induced constipation: Secondary | ICD-10-CM | POA: Diagnosis not present

## 2017-07-30 DIAGNOSIS — Z6827 Body mass index (BMI) 27.0-27.9, adult: Secondary | ICD-10-CM | POA: Diagnosis not present

## 2017-07-30 DIAGNOSIS — I5022 Chronic systolic (congestive) heart failure: Secondary | ICD-10-CM | POA: Diagnosis not present

## 2017-07-30 DIAGNOSIS — R1084 Generalized abdominal pain: Secondary | ICD-10-CM | POA: Diagnosis not present

## 2017-08-08 DIAGNOSIS — I10 Essential (primary) hypertension: Secondary | ICD-10-CM | POA: Diagnosis not present

## 2017-08-15 DIAGNOSIS — R0602 Shortness of breath: Secondary | ICD-10-CM | POA: Diagnosis not present

## 2017-08-15 DIAGNOSIS — I10 Essential (primary) hypertension: Secondary | ICD-10-CM | POA: Diagnosis not present

## 2017-08-30 DIAGNOSIS — I878 Other specified disorders of veins: Secondary | ICD-10-CM | POA: Diagnosis not present

## 2017-08-30 DIAGNOSIS — I5032 Chronic diastolic (congestive) heart failure: Secondary | ICD-10-CM | POA: Diagnosis not present

## 2017-08-30 DIAGNOSIS — I10 Essential (primary) hypertension: Secondary | ICD-10-CM | POA: Diagnosis not present

## 2017-09-27 ENCOUNTER — Encounter: Payer: Self-pay | Admitting: Neurology

## 2017-10-02 ENCOUNTER — Ambulatory Visit (INDEPENDENT_AMBULATORY_CARE_PROVIDER_SITE_OTHER): Payer: Medicare Other | Admitting: Neurology

## 2017-10-02 ENCOUNTER — Encounter: Payer: Self-pay | Admitting: Neurology

## 2017-10-02 VITALS — BP 117/77 | HR 82 | Ht 74.0 in | Wt 219.0 lb

## 2017-10-02 DIAGNOSIS — I5032 Chronic diastolic (congestive) heart failure: Secondary | ICD-10-CM | POA: Insufficient documentation

## 2017-10-02 DIAGNOSIS — F1190 Opioid use, unspecified, uncomplicated: Secondary | ICD-10-CM

## 2017-10-02 DIAGNOSIS — B3324 Viral cardiomyopathy: Secondary | ICD-10-CM

## 2017-10-02 DIAGNOSIS — I5042 Chronic combined systolic (congestive) and diastolic (congestive) heart failure: Secondary | ICD-10-CM | POA: Diagnosis not present

## 2017-10-02 DIAGNOSIS — G4737 Central sleep apnea in conditions classified elsewhere: Secondary | ICD-10-CM

## 2017-10-02 DIAGNOSIS — J4550 Severe persistent asthma, uncomplicated: Secondary | ICD-10-CM | POA: Diagnosis not present

## 2017-10-02 HISTORY — DX: Opioid use, unspecified, uncomplicated: F11.90

## 2017-10-02 NOTE — Progress Notes (Signed)
SLEEP MEDICINE CLINIC   Provider:  Larey [name], M D  Primary Care Physician:  Peter Baton, MD   Referring Provider: Shon Baton, MD    Chief Complaint  Patient presents with  . New Patient (Initial Visit)    pt alone, rm 10. pt had a sleep study in 2015 through [place] and at that time started CPAP.  pt states he used the CPAP <6 months and just was unable to tolerate at this time. pt states ready to give CPAP another try    HPI:  Peter [name] is a 67 y.o. male , seen here as in a referral  from [name], Dr. Einar [name], and Dr. Wynelle [name] for a new evaluation of sleep apnea.   I had the pleasure of meeting with Mr. to send a in late summer 2015 after he was referred by his cardiologist.  At that time he had also another primary neurologist in our office, Dr. Krista [name], who evaluated and treated him for back pain.  Later he had surgery with Dr. Ashok [name].  [name]. is a meanwhile 67 year old gentleman with a history of asthma, progressive cardiomyopathy at this time estimated ejection fraction is 15% admitted to hospital in September 2015, most recent ejection fraction was 55%, he also has hypothyroidism, and when evaluated in the previous sleep study he was taking narcotic pain medications for chronic back pain.  While the patient was hospitalized in September 2018 under Dr. Valentina [name] care he was found to require BiPAP and needed additional oxygen while an inpatient.  [name] last notes there was no hypoxia noted during the visit and he continued on Singulair DuoNeb.  He was using albuterol 4 hours prior to admission to the hospital and it did not work for him.  He could not afford to [name]- now has patient assistance through [name] office. Bidil has been effective -  Hypertension has been controlled on metoprolol irbesartan and furosemide.  hypothyroidism on Synthroid 150 mcg oral tablet since Rv with Dr Peter [name].     A sleep study had been obtained on 22 July 2014 but the patient was  diagnosed with rather mild apnea at an AHI of 11.0 which exacerbated during REM sleep to 28.4.  There was no supine exacerbation.  The patient briefly retain CO2 but a diagnosis of hypercapnia could not be made.  He also did not appear hypoxemic.  Due to his cardiac condition comorbidities he was asked to return for a CPAP titration which was performed on 07 September 2014.  He was titrated to 9 cm of water CPAP under which she slept 411 minutes and regained 27 minutes of REM sleep.  He also used an air fit P 10 nasal pillow mask but at home developed soon after difficulties with keeping the mask in place.  The nasal pillow slipped frequently and he had trouble regaining sleep when he woke up from air leaks.   Sleep habits are as follows: Bedtime is usually between midnight and 2 AM for this gentleman, who has always described himself as a "night owl". And asleep.  Of estimated 6 hours of sleep he has to go to the bathroom 4 times.  He does get back to sleep relatively easily.  Sleep supine or on his sides, on to firm pillows.  The bedroom is described as cool, quiet and dark, but couple sleeps in 2 different bedrooms as they have different circadian rhythms. He wakes spontaneously at 8.30 - 9 AM.  He has a  similar sleep habit on weekends, he  takes 2-hour afternoon naps  1-3 times a week.  Sleep medical history and family sleep history: His maternal family has a history of diabetes as well as hypertension, his mother died young at only 22 years of age due to pancreatic cancer.  Father died young at 72 years of age.  He has 4 half siblings but is not in close contact.  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  He quit smoking in 1996, he does not use alcohol, and drinks 200 mg caffeine a day - espresso!    he is remarried  to former wife Peter [name] as of March 2018  Review of Systems: Out of a complete 14 system review, the patient complains of only the following symptoms, and all other reviewed systems are negative.  Nocturia 4-5 nightly, shortness of breath,  Epworth score 12 , Fatigue severity score 31 , and  depression score n/a    Social History   Socioeconomic History  . Marital status: Married    Spouse name: Peter [name]  . Number of children: 4  . Years of education: College  . Highest education level: Not on file  Social Needs  . Financial resource strain: Not on file  . Food insecurity - worry: Not on file  . Food insecurity - inability: Not on file  . Transportation needs - medical: Not on file  . Transportation needs - non-medical: Not on file  Occupational History    Employer: [employer]   Tobacco Use  . Smoking status: Former Smoker    Years: 0.00    Last attempt to quit: 08/14/1995    Years since quitting: 22.1  . Smokeless tobacco: Never Used  Substance and Sexual Activity  . Alcohol use: No    Alcohol/week: 0.0 oz  . Drug use: No  . Sexual activity: Yes  Other Topics Concern  . 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   Patient has four adult children.   Patient is disabled.   Patient has college education.   Patient is right-handed.   Patient drinks very little caffeine.    Family History  Problem Relation Age of Onset  . Diabetes Mother   . Hypertension Mother   . Pancreatic cancer Mother   . Colon cancer Neg Hx     Past Medical History:  Diagnosis Date  . Acute combined systolic and diastolic heart failure ([place]) 03/11/2013  . Allergy   . Arthritis    right knee  . Asthma   . Back pain   . Cardiomyopathy ([place]) 2014   "viral"  . Chronic kidney disease   . Colon polyps 2003, 2015   2003: hyperplastic. 05/2014: tubular adenoma  . Complication of anesthesia    problems urinating after anesthesia  .  Diverticulosis of colon 2003, 2015  . Hypertension   . Hypothyroidism    had Graves' disease - thyroid ablation  . MRSA (methicillin resistant Staphylococcus aureus) infection    lumbar-2015  . Obstructive sleep apnea syndrome 09/22/2014   uses cpap  . S/P radioactive iodine thyroid ablation 06/04/2017  . S/P radioactive iodine thyroid ablation- for Graves disease 06/04/2017  . Substance abuse ([place])    not in many years  . Wears  glasses   . Wears partial dentures    top    Past Surgical History:  Procedure Laterality Date  . ANTERIOR CERVICAL DECOMP/DISCECTOMY FUSION N/A 07/30/2013   Procedure: CERVICAL FIVE,CERVICAL SIX CORPECTOMY,ANTERIOR ARTHRODESIS,PEEK INTERBODY GRAFT CERVICAL FOUR-CERVICAL SEVEN,ANTERIOR INSTRUMENTATION;  Surgeon: Winfield Cunas, MD;  Location: [facility];  Service: Neurosurgery;  Laterality: N/A;  . ANTERIOR CERVICAL DECOMP/DISCECTOMY FUSION N/A 04/16/2015   Procedure: CERVICAL THREE-FOUR ANTERIOR CERVICAL DECOMPRESSION/DISCECTOMY FUSION 1 LEVEL;  Surgeon: Peter Pall, MD;  Location: [facility];  Service: Neurosurgery;  Laterality: N/A;  C34 anterior cervical decompression with fusion plating and bonegraft  . BACK SURGERY  1/15   lumb fusion  . CARDIAC CATHETERIZATION  2014  . CARPAL TUNNEL RELEASE Right 03/23/2014   Procedure: RIGHT CARPAL TUNNEL RELEASE;  Surgeon: Tennis Must, MD;  Location: [facility];  Service: Orthopedics;  Laterality: Right;  . COLONOSCOPY  2003, 2015  . LEFT AND RIGHT HEART CATHETERIZATION WITH CORONARY ANGIOGRAM N/A 03/11/2013   Procedure: LEFT AND RIGHT HEART CATHETERIZATION WITH CORONARY ANGIOGRAM;  Surgeon: Laverda Page, MD;  Location: [facility] CATH [facility];  Service: Cardiovascular;  Laterality: N/A;  . LUMBAR WOUND DEBRIDEMENT N/A 11/07/2013   Procedure: LUMBAR WOUND DEBRIDEMENT;  Surgeon: Winfield Cunas, MD;  Location: [facility];  Service: Neurosurgery;  Laterality: N/A;  . TONSILLECTOMY  1958    Current  Outpatient Medications  Medication Sig Dispense Refill  . albuterol (PROVENTIL) (2.5 MG/3ML) 0.083% nebulizer solution Take 3 mLs (2.5 mg total) by nebulization every 2 (two) hours as needed for wheezing or shortness of breath (Albuterol Nebs used for Inhaler per [place]). 75 mL 12  . cetirizine (ZYRTEC) 10 MG tablet Take 10 mg by mouth daily as needed.     . docusate sodium (COLACE) 100 MG capsule Take 200 mg by mouth daily.     . furosemide (LASIX) 20 MG tablet Take 1 tablet (20 mg total) by mouth daily. (Patient taking differently: Take 20 mg by mouth daily as needed for fluid or edema. ) 30 tablet   . guaiFENesin (MUCINEX) 600 MG 12 hr tablet Take 2 tablets (1,200 mg total) by mouth 2 (two) times daily. 60 tablet 0  . ipratropium-albuterol (DUONEB) 0.5-2.5 (3) MG/3ML SOLN Take 3 mLs by nebulization every 4 (four) hours. 360 mL 0  . irbesartan (AVAPRO) 75 MG tablet Take 75 mg by mouth daily.    Marland Kitchen levothyroxine (SYNTHROID, LEVOTHROID) 150 MCG tablet Take 200 mcg by mouth daily before breakfast.    . montelukast (SINGULAIR) 10 MG tablet Take 10 mg by mouth at bedtime.    . Omega-3 Fatty Acids (FISH OIL) 1000 MG CAPS Take 3,000 mg by mouth daily.    Marland Kitchen oxyCODONE (ROXICODONE) 5 MG immediate release tablet Take 1 tablet (5 mg total) by mouth every 4 (four) hours as needed for severe pain. 45 tablet 0  . VENTOLIN HFA 108 (90 Base) MCG/ACT inhaler Inhale 2 puffs into the lungs every 4 (four) hours as needed.     No current facility-administered medications for this visit.     Allergies as of 10/02/2017 - Review Complete 10/02/2017  Allergen Reaction Noted  . Keflex [cephalexin] Anaphylaxis 03/10/2013    Vitals: BP 117/77   Pulse 82   Ht 6\' 2"  (1.88 m)   Wt 219 lb (99.3 kg)   BMI 28.12 kg/m  Last Weight:  Wt Readings from Last 1 Encounters:  10/02/17 219 lb (99.3 kg)   MVH:QION  mass index is 28.12 kg/m.     Last Height:   Ht Readings from Last 1 Encounters:  10/02/17 6'  2" (1.88 m)    Physical exam:  General: The patient is awake, alert and appears not in acute distress. The patient is well groomed. Poor dentition.  Head: Normocephalic, atraumatic. Neck is supple. Mallampati 2- wide open.   neck circumference:17.5 ". Nasal airflow patent ,  Retrognathia is  seen.  Cardiovascular:  Regular rate and rhythm , without  murmurs or carotid bruit, and without distended neck veins. Respiratory: Lungs are clear to auscultation ! No wheezing . Skin:  Without evidence of edema, or rash Trunk: BMI is 28.12 - The patient's posture is erect   Neurologic exam : The patient is awake and alert, oriented to place and time.   Memory subjective  described as intact.  Attention span & concentration ability appears normal.  Speech is fluent,  without dysarthria, dysphonia or aphasia.  Mood and affect are appropriate.  Cranial nerves: Pupils are equal and briskly reactive to light. Funduscopic exam without evidence of pallor or edema. Extraocular movements  in vertical and horizontal planes intact and without nystagmus. Visual fields by finger perimetry are intact. Hearing to finger rub intact.  Facial sensation intact to fine touch. Facial motor strength is symmetric and tongue and uvula move midline. Shoulder shrug was symmetrical.   Motor exam:  Normal tone, muscle bulk and symmetric strength in all extremities. Sensory:  Fine touch, pinprick and vibration were tested in all extremities. Proprioception tested in the upper extremities was normal. Coordination: Rapid alternating movements in the fingers/hands was normal. Finger-to-nose maneuver  normal without evidence of ataxia, dysmetria or tremor.  Gait and station: Patient walks without assistive device    Assessment:  After physical and neurologic examination, review of laboratory studies,  Personal review of imaging studies, reports of other /same  Imaging studies, results of polysomnography and / or neurophysiology  testing and pre-existing records as far as provided in visit., my assessment is:   1) Untreated OSA after a brief period of CPAP use in 2016. Recent 05/2017 hospitalization under Dr Reggy Eye care revealed an improvement in ejection fraction, and need for less Synthroid medication, but dependency during hospitalization on BiPAP and additional oxygen supplementation as documented in her discharge notes. Patient continues on furosemide 20 mg oral to control fluid buildup and edema.  He reports excessive daytime sleepiness not infrequent need for naps,   2) severe asthma beginning in childhood now controlled on different inhalers and aerosol solutions, takes also Flonase, Singulair, albuterol ipratropium, Brio Ellipta, and cetirizine for allergy control. This contributes to a high risk for OSA- hypopnea, hypoxemia.   3) chronic pain condition on continues to code on, as needed use of 5 mg oral tablets up to twice a day.  Opiate-induced constipation treated with Colace. High risk for central apnea.   4) cardiomyopathy due to viral myocarditis in June 2014 at peak 15% ejection fraction.  Now returning to 55%. He is at high risk of Central sleep apnea.  The patient was advised of the nature of the diagnosed disorder , the treatment options and the  risks for general health and wellness arising from not treating the condition.   I spent more than 50 minutes of face to face time with the patient.  Greater than 50% of time was spent in counseling and coordination of care. We have discussed the diagnosis and differential and I answered the patient's questions.  Plan:  Treatment plan and additional workup :  I plan to invite Dr. Norwood Levo to a SPLIT night polysomnography with hypoxemia and hypercapnia, and with SPLIT into CPAP titration as soon as AHI of 20 or prolonged hypoxemia is seen.  He needs likely to use BiPAP and may still require additional oxygen.  CO2 peak over 45 torr in REM and NREM to be  noted- time above 50 torr to be stated. Marland Kitchen     Peter Seat, MD 2/0/3559, 7:41 AM  Certified in Neurology by ABPN Certified in [place] by [facility] [address] [place], [place] [address]

## 2017-10-02 NOTE — Patient Instructions (Signed)

## 2017-10-15 DIAGNOSIS — I5032 Chronic diastolic (congestive) heart failure: Secondary | ICD-10-CM | POA: Diagnosis not present

## 2017-10-15 DIAGNOSIS — I10 Essential (primary) hypertension: Secondary | ICD-10-CM | POA: Diagnosis not present

## 2017-10-15 DIAGNOSIS — I878 Other specified disorders of veins: Secondary | ICD-10-CM | POA: Diagnosis not present

## 2017-11-11 ENCOUNTER — Ambulatory Visit (INDEPENDENT_AMBULATORY_CARE_PROVIDER_SITE_OTHER): Payer: Medicare Other | Admitting: Neurology

## 2017-11-11 DIAGNOSIS — B3324 Viral cardiomyopathy: Secondary | ICD-10-CM

## 2017-11-11 DIAGNOSIS — G4737 Central sleep apnea in conditions classified elsewhere: Secondary | ICD-10-CM

## 2017-11-11 DIAGNOSIS — G4733 Obstructive sleep apnea (adult) (pediatric): Secondary | ICD-10-CM

## 2017-11-11 DIAGNOSIS — F1190 Opioid use, unspecified, uncomplicated: Secondary | ICD-10-CM

## 2017-11-11 DIAGNOSIS — J4550 Severe persistent asthma, uncomplicated: Secondary | ICD-10-CM

## 2017-11-11 DIAGNOSIS — I5042 Chronic combined systolic (congestive) and diastolic (congestive) heart failure: Secondary | ICD-10-CM

## 2017-11-16 ENCOUNTER — Other Ambulatory Visit: Payer: Self-pay | Admitting: Neurology

## 2017-11-16 DIAGNOSIS — G4733 Obstructive sleep apnea (adult) (pediatric): Secondary | ICD-10-CM

## 2017-11-16 DIAGNOSIS — R0609 Other forms of dyspnea: Secondary | ICD-10-CM

## 2017-11-16 DIAGNOSIS — I5041 Acute combined systolic (congestive) and diastolic (congestive) heart failure: Secondary | ICD-10-CM

## 2017-11-16 NOTE — Procedures (Signed)
PATIENT'S NAME:  Peter [name], Peter [name] DOB:      May 05, 1951      MR#:    [id number]     DATE OF RECORDING: 11/11/2017 REFERRING M.D.:  Peter [name], M.D. Study Performed:   Baseline Polysomnogram HISTORY:  [name]. is a 67 year old gentleman with asthma, progressive cardiomyopathy admitted to hospital in September 2015 with EF of 15-20%, most recent ejection fraction was 55%, also has hypothyroidism. During a hospitalization in September 2018 under [name] care he was found to require BiPAP and needed additional oxygen while an inpatient.  [name] last notes documented no hypoxia and he continued on Singulair DuoNeb.  He was using albuterol 4 hours prior to admission to the hospital and it did not work for him.  He could not afford to [facility]- now has patient assistance through [name] office.  Bidil has been effective - Hypertension has been controlled on metoprolol, Irbesartan, and furosemide.  Hypothyroidism on Synthroid 150 mcg oral tablet - [name].  A sleep study had been obtained on 22 July 2014 but the patient was diagnosed with rather mild apnea at an AHI of 11.0 which exacerbated during REM sleep to 28.4/hr.  There was no supine exacerbation.  The patient briefly retain CO2 but a diagnosis of hypercapnia could not be made.  He also did not appear hypoxemic. He was taking narcotic medications for chronic back pain. Due to his cardiac comorbidities he was asked to return for a CPAP titration performed on 07 September 2014.  He was titrated to 9 cm of water CPAP under which he slept 411 minutes and regained 27 minutes of REM sleep.  He also used an air fit P 10 nasal pillow mask but at home developed soon after difficulties with keeping the mask in place.  The nasal pillow slipped frequently and he had trouble regaining sleep when he woke up from air leaks.  The patient endorsed the Epworth Sleepiness Scale at 12/24 points.   The patient's weight 219 pounds with a height of 74 (inches),  resulting in a BMI of 28. Kg/m2. The patient's neck circumference measured 17.5 inches.  CURRENT MEDICATIONS: Albuterol, Cetirizine, Docusate, Furosemide, Guaifenesin, Ipratropium, Irbesartan, Levothyroxine, Montelukast, Moeg-3, Oxycodone and Ventolin.   PROCEDURE:  This is a multichannel digital polysomnogram utilizing the Somnostar 11.2 system.  Electrodes and sensors were applied and monitored per AASM Specifications.   EEG, EOG, Chin and Limb EMG, were sampled at 200 Hz.  ECG, Snore and Nasal Pressure, Thermal Airflow, Respiratory Effort, CPAP Flow and Pressure, Oximetry was sampled at 50 Hz. Digital video and audio were recorded.      BASELINE STUDY Lights Out was at 22:45 and Lights On at 05:16.  Total recording time (TRT) was 391.5 minutes, with a total sleep time (TST) of 286 minutes.   The patient's sleep latency was 49.5 minutes.  REM latency was 0 minutes.  The sleep efficiency was 73.1 %.     SLEEP ARCHITECTURE: WASO (Wake after sleep onset) was 69.5 minutes.  There were 39 minutes in Stage N1, 247 minutes Stage N2, 0 minutes Stage N3 and 0 minutes in Stage REM.  The percentage of Stage N1 was 13.6%, Stage N2 was 86.4%, Stage N3 was 0% and Stage R (REM sleep) was 0%.    RESPIRATORY ANALYSIS:  There were a total of 115 respiratory events:  0 apneas and 115 hypopneas with a hypopnea index of 24.1 /hour. The patient also had 0 respiratory event related arousals (RERAs).  The total APNEA/HYPOPNEA INDEX (AHI) was 24.1/hour and the total RESPIRATORY DISTURBANCE INDEX was 24.1 /hour.  0 events occurred in REM sleep and 230 events in NREM. The REM AHI was 0 /hour, versus a non-REM AHI of 24.1/hr. The patient spent all 286 minutes of total sleep time in the supine position.  OXYGEN SATURATION & C02:  The Wake baseline 02 saturation was 96%, with the lowest being 88%. Time spent below 89% saturation equaled 0 minutes.  Total sleep time greater than 40 torr was 0.70 minutes.   PERIODIC LIMB  MOVEMENTS:  The patient had a total of 0 Periodic Limb Movements.  The arousals were noted as: 72 were spontaneous, 0 were associated with PLMs, and 118 were associated with respiratory events.  Audio and video analysis did not show any abnormal or unusual movements, behaviors, phonations or vocalizations. The patient had no bathroom breaks. Very mild snoring was noted. EKG was in keeping with normal sinus rhythm (NSR). Post-study, the patient indicated that sleep was the same as usual.   IMPRESSION: Moderate degree of Hypopneas/ Obstructive Sleep Apnea (OSA with AHI of 24.1) without hypoxemia and with only mild snoring. All sleep supine, no REM sleep seen.  1. Patient felt he slept well.   RECOMMENDATIONS:  1. Advise full-night, attended, CPAP titration study to optimize therapy.   2. Further information regarding OSA may be obtained from [employer] (www.sleepfoundation.org) or American Sleep Apnea Association (www.sleepapnea.org). 3. A follow up appointment will be scheduled in the Sleep Clinic at [facility]. The referring provider will be notified of the results.      I certify that I have reviewed the entire raw data recording prior to the issuance of this report in accordance with the Standards of Accreditation of the American Academy of Sleep Medicine (AASM)   Larey Seat, MD    11-16-2017  Diplomat, American Board of Psychiatry and Neurology  Diplomat, American Board of [place] Director, [place] [facility] [employer]

## 2017-11-19 ENCOUNTER — Telehealth: Payer: Self-pay | Admitting: Neurology

## 2017-11-19 NOTE — Telephone Encounter (Signed)
Called patient to discuss sleep study results. No answer at this time. LVM for the patient to call back.   

## 2017-11-19 NOTE — Telephone Encounter (Signed)
-----   Message from Larey Seat, MD sent at 11/16/2017  1:09 PM EST ----- Moderate sleep hypopnea - no longer hypoxemia. I recommend return for full night titration.

## 2017-11-19 NOTE — Telephone Encounter (Signed)
Pt returned RN's call °

## 2017-11-20 NOTE — Telephone Encounter (Signed)
Pt returned call. I advised pt that Dr. Dohmeier reviewed their sleep study results and found that has sleep apnea  and recommends that pt be treated with a cpap. Dr. Dohmeier recommends that pt return for a repeat sleep study in order to properly titrate the cpap and ensure a good mask fit. Pt is agreeable to returning for a titration study. I advised pt that our sleep lab will file with pt's insurance and call pt to schedule the sleep study when we hear back from the pt's insurance regarding coverage of this sleep study. Pt verbalized understanding of results. Pt had no questions at this time but was encouraged to call back if questions arise.   

## 2017-11-20 NOTE — Telephone Encounter (Signed)
Called the patient back no answer. LVM for the patient to call back

## 2017-11-21 DIAGNOSIS — R0600 Dyspnea, unspecified: Secondary | ICD-10-CM | POA: Diagnosis not present

## 2017-11-21 DIAGNOSIS — K5903 Drug induced constipation: Secondary | ICD-10-CM | POA: Diagnosis not present

## 2017-11-21 DIAGNOSIS — J45909 Unspecified asthma, uncomplicated: Secondary | ICD-10-CM | POA: Diagnosis not present

## 2017-11-21 DIAGNOSIS — R05 Cough: Secondary | ICD-10-CM | POA: Diagnosis not present

## 2017-11-21 DIAGNOSIS — M5380 Other specified dorsopathies, site unspecified: Secondary | ICD-10-CM | POA: Diagnosis not present

## 2017-11-21 DIAGNOSIS — I5022 Chronic systolic (congestive) heart failure: Secondary | ICD-10-CM | POA: Diagnosis not present

## 2017-11-21 DIAGNOSIS — J309 Allergic rhinitis, unspecified: Secondary | ICD-10-CM | POA: Diagnosis not present

## 2017-12-18 ENCOUNTER — Ambulatory Visit (INDEPENDENT_AMBULATORY_CARE_PROVIDER_SITE_OTHER): Payer: Medicare Other | Admitting: Neurology

## 2017-12-18 DIAGNOSIS — I5041 Acute combined systolic (congestive) and diastolic (congestive) heart failure: Secondary | ICD-10-CM

## 2017-12-18 DIAGNOSIS — G4733 Obstructive sleep apnea (adult) (pediatric): Secondary | ICD-10-CM

## 2017-12-18 DIAGNOSIS — R0609 Other forms of dyspnea: Secondary | ICD-10-CM

## 2017-12-25 NOTE — Procedures (Signed)
PATIENT'S NAME:  Peter [name], Peter [name] DOB:      [birth year]      MR#:    [id number]     DATE OF RECORDING: 12/18/2017 REFERRING M.D.:  Shon Baton, M.D. Study Performed:   CPAP  Titration HISTORY:  This 67 year old patient had a PSG on 11/11/17, which documented an (AHI) of 24.1/hour. There was no REM sleep recorded. The average 02 saturation was at 96%, with the lowest being 88%. Time spent below 89% saturation equaled 0 minutes. History of asthma.   The patient endorsed the Epworth Sleepiness Scale at 12/24 points.   The patient's weight 218 pounds with a height of 74 (inches), resulting in a BMI of 28. Kg/m2. The patient's neck circumference measured 17 inches.  CURRENT MEDICATIONS: Albuterol, Cetirizine, Docusate, Furosemide, Guaifenesin, Ipratropium, Irbesartan, Levothyroxine, Montelukast, Moeg-3, Oxycodone and Ventolin.   PROCEDURE:  This is a multichannel digital polysomnogram utilizing the SomnoStar 11.2 system.  Electrodes and sensors were applied and monitored per AASM Specifications.   EEG, EOG, Chin and Limb EMG, were sampled at 200 Hz.  ECG, Snore and Nasal Pressure, Thermal Airflow, Respiratory Effort, CPAP Flow and Pressure, Oximetry was sampled at 50 Hz. Digital video and audio were recorded.      CPAP was initiated at 5 cmH20 with heated humidity per AASM split night standards and pressure was advanced to 10 cmH20 because of hypopneas, apneas and desaturations.  At a PAP pressure of 10 cmH20, there was a reduction of the AHI to 0.0 with improvement of the above symptoms of obstructive sleep apnea.    Lights Out was at 22:53 and Lights On at 05:39. Total recording time (TRT) was 406.5 minutes, with a total sleep time (TST) of 120.5 minutes. The patient's sleep latency was 174 minutes with 11 minutes of wake time after sleep onset. REM void.  The sleep efficiency was very poor at only 29.6 %.    SLEEP ARCHITECTURE: WASO (Wake after sleep onset) was 202.5 minutes.  There were 28 minutes  in Stage N1, 92.5 minutes Stage N2, 0 minutes Stage N3 and 0 minutes in Stage REM.  The percentage of Stage N1 was 23.2%, Stage N2 was 76.8%, Stage N3 was 0% and Stage R (REM sleep) was 0%.   RESPIRATORY ANALYSIS:  There was a total of 15 respiratory events: 0 obstructive apneas, 0 central apneas and 0 mixed apneas with a total of 0 apneas and an apnea index (AI) of 0 /hour. There were 15 hypopneas with a hypopnea index of 7.5/hour. The patient also had 0 respiratory event related arousals (RERAs).    The total APNEA/HYPOPNEA INDEX  (AHI) was 7.5 /hour and the total RESPIRATORY DISTURBANCE INDEX was 7.5 .hour  0 events occurred in REM sleep and 15 events in NREM. The REM AHI was 0 /hour versus a non-REM AHI of 7.5 /hour.  The patient spent 120.5 minutes of total sleep time in the supine position and 0 minutes in non-supine. The supine AHI was 7.5, versus a non-supine AHI of 0.0.  OXYGEN SATURATION & C02:  The baseline 02 saturation was 96%, with the lowest being 89%. Time spent below 89% saturation equaled 0 minutes.  PERIODIC LIMB MOVEMENTS:    The patient had a total of 0 Periodic Limb Movements. The arousals were noted as: 49 were spontaneous, 0 were associated with PLMs, and 15 were associated with respiratory events.  Audio and video analysis did not show any abnormal or unusual movements, behaviors, phonations or vocalizations.  The  patient took 2 bathroom breaks. Snoring was noted. EKG was in keeping with normal sinus rhythm (NSR).  Post-study, the patient indicated that sleep was worse than usual. Sleep efficiency was poor.   The patient was finally fitted with a [employer] full face mask "Simplus " in large size.  DIAGNOSIS 1. Obstructive Sleep Apnea  2. Reduced sleep efficiency, which could be secondary to sleeping in laboratory environment, or asthma, or oral venting.     PLANS/RECOMMENDATIONS: Trail for 30-60 days of auto CPAP 5-12 cm water with 2 cm EPR, FFM as described above.  I will offer a sleep aid if patients insomnia interferes with use of CPAP.  1. Treatment options include auto -CPAP, if this fails to improve the AHI consider Dental device, ENT device.  A follow up appointment will be scheduled in the Sleep Clinic at [facility].   Please call [phone] with any questions.      I certify that I have reviewed the entire raw data recording prior to the issuance of this report in accordance with the Standards of Accreditation of the American Academy of Sleep Medicine (AASM)    Larey Seat, M.D.     12-25-2017  Diplomat, American Board of Psychiatry and Neurology  Diplomat, [place] of Sleep Medicine Medical Director, [place] [facility] at [facility]

## 2017-12-25 NOTE — Addendum Note (Signed)
Addended by: Larey Seat on: 12/25/2017 10:05 AM   Modules accepted: Orders

## 2017-12-26 ENCOUNTER — Telehealth: Payer: Self-pay

## 2017-12-26 NOTE — Telephone Encounter (Signed)
-----   Message from Larey Seat, MD sent at 12/25/2017 10:05 AM EDT ----- Post-study, the patient indicated that sleep was worse than  usual. Sleep efficiency was poor.  The patient was finally fitted with a [employer] full face  mask "Simplus " in large size.  DIAGNOSIS 1. Obstructive Sleep Apnea  2. Reduced sleep efficiency, which could be secondary to sleeping  in laboratory environment, or asthma, or oral venting.    PLANS/RECOMMENDATIONS: Trail for 30-60 days of auto CPAP 5-12 cm  water with 2 cm EPR, FFM as described above. I will offer a sleep  aid if patients insomnia interferes with use of CPAP.  1. Treatment options include auto -CPAP, if this fails to improve  the AHI consider Dental device, ENT device. A follow up  appointment will be scheduled in the Sleep Clinic at [facility].  Please call [phone] with any  questions.

## 2017-12-26 NOTE — Telephone Encounter (Signed)
I called pt to discuss his sleep study results. No answer, left a message asking him to call me back. 

## 2017-12-27 ENCOUNTER — Telehealth: Payer: Self-pay | Admitting: Neurology

## 2017-12-27 NOTE — Telephone Encounter (Signed)
Pt returned RN's call °

## 2017-12-27 NOTE — Telephone Encounter (Signed)
Called patient to discuss sleep study results. 2nd attempt. No answer at this time. LVM for the patient to call back.

## 2017-12-27 NOTE — Telephone Encounter (Signed)
Attempted to call the patient back. No answer. LVM informing him I returned call. Asked to call back.

## 2017-12-27 NOTE — Telephone Encounter (Signed)
-----   Message from Larey Seat, MD sent at 12/25/2017 10:05 AM EDT ----- Post-study, the patient indicated that sleep was worse than  usual. Sleep efficiency was poor.  The patient was finally fitted with a [employer] full face  mask "Simplus " in large size.  DIAGNOSIS 1. Obstructive Sleep Apnea  2. Reduced sleep efficiency, which could be secondary to sleeping  in laboratory environment, or asthma, or oral venting.    PLANS/RECOMMENDATIONS: Trail for 30-60 days of auto CPAP 5-12 cm  water with 2 cm EPR, FFM as described above. I will offer a sleep  aid if patients insomnia interferes with use of CPAP.  1. Treatment options include auto -CPAP, if this fails to improve  the AHI consider Dental device, ENT device. A follow up  appointment will be scheduled in the Sleep Clinic at [facility].  Please call [phone] with any  questions.

## 2017-12-28 NOTE — Telephone Encounter (Signed)
I called pt. I advised pt that Dr. Brett Fairy reviewed their sleep study results and found that pt struggled with the cpap during the study. Dr. Brett Fairy recommends that pt attempt an auto-pap at home for 30-60 days and if this fails to improve the AHI, other treatment options include a dental device or ENT device. I advised pt that Dr. Brett Fairy can offer pt a sleep aid if insomnia interferes with the use of cpap. I reviewed PAP compliance expectations with the pt. Pt is agreeable to starting an auto-PAP. I advised pt that an order will be sent to a DME, [facility], and [facility] will call the pt within about one week after they file with the pt's insurance. [facility] will show the pt how to use the machine, fit for masks, and troubleshoot the auto-PAP if needed. A follow up appt was made for insurance purposes with Hoyle Sauer, NP on 03/06/18 at 9:15am. Pt verbalized understanding to arrive 15 minutes early and bring their auto-PAP. A letter with all of this information in it will be sent to the pt's mychart as a reminder. I verified with the pt that the address we have on file is correct. Pt verbalized understanding of results. Pt had no questions at this time but was encouraged to call back if questions arise.

## 2018-01-07 NOTE — Telephone Encounter (Signed)
Pt called and was questioning about the so clean machine I educated on what that was and pricing of the machine and informed him that it was not covered under the insurance. Pt also informed that [facility] had not contacted him yet. I gave the pt there number for him to call. Pt verbalized understanding.

## 2018-01-22 NOTE — Telephone Encounter (Signed)
Patient has called today stating that [facility] has spoken with him once to let him know they received the order but he states that he has not heard anything else and has not been set up with the new machine. I have informed him that I will contact [employer] and someone should be in contact with him today.

## 2018-02-21 DIAGNOSIS — R82998 Other abnormal findings in urine: Secondary | ICD-10-CM | POA: Diagnosis not present

## 2018-02-21 DIAGNOSIS — R7309 Other abnormal glucose: Secondary | ICD-10-CM | POA: Diagnosis not present

## 2018-02-21 DIAGNOSIS — Z125 Encounter for screening for malignant neoplasm of prostate: Secondary | ICD-10-CM | POA: Diagnosis not present

## 2018-02-21 DIAGNOSIS — I10 Essential (primary) hypertension: Secondary | ICD-10-CM | POA: Diagnosis not present

## 2018-02-21 DIAGNOSIS — E038 Other specified hypothyroidism: Secondary | ICD-10-CM | POA: Diagnosis not present

## 2018-02-21 DIAGNOSIS — E7849 Other hyperlipidemia: Secondary | ICD-10-CM | POA: Diagnosis not present

## 2018-02-21 DIAGNOSIS — E538 Deficiency of other specified B group vitamins: Secondary | ICD-10-CM | POA: Diagnosis not present

## 2018-03-01 DIAGNOSIS — Z1212 Encounter for screening for malignant neoplasm of rectum: Secondary | ICD-10-CM | POA: Diagnosis not present

## 2018-03-05 NOTE — Progress Notes (Signed)
[facility]  PATIENT: Shafer Vallery DOB: June 19, 1951   REASON FOR VISIT: Follow-up for CPAP  compliance HISTORY FROM: Patient    HISTORY OF PRESENT ILLNESS:UPDATE 6/12/2019CM [name], 67 year old male returns for follow-up with Mrs. of obstructive sleep apnea.  He also has a history of progressive cardiomyopathy and asthma.  He states he is still getting used to the machine, sometimes he has more breathing difficulty and others.  CPAP data dated 02/03/2018-03/04/2018 shows greater than 4 hours at 60%.  Average usage 4 hours 19 minutes.  Set pressure 5 to 12 cm.  EPR level 2 AHI 7.9.  He returns for reevaluation  10/02/17 [name] is a 67 y.o. male , seen here as in a referral  from Dr. [name], Dr. Einar Gip, and Dr. Wynelle Cleveland for a new evaluation of sleep apnea.   I had the pleasure of meeting with Mr. to send a in late summer 2015 after he was referred by his cardiologist.  At that time he had also another primary neurologist in our office, Dr. Krista Blue, who evaluated and treated him for back pain.  Later he had surgery with Dr. Ashok Pall.  [name] is a meanwhile 67 year old gentleman with a history of asthma, progressive cardiomyopathy at this time estimated ejection fraction is 15% admitted to hospital in September 2015, most recent ejection fraction was 55%, he also has hypothyroidism, and when evaluated in the previous sleep study he was taking narcotic pain medications for chronic back pain.  While the patient was hospitalized in September 2018 under Dr. Valentina Gu care he was found to require BiPAP and needed additional oxygen while an inpatient.  Dr. Keane Police last notes there was no hypoxia noted during the visit and he continued on Singulair DuoNeb.  He was using albuterol 4 hours prior to admission to the hospital and it did not work for him.  He could not afford to [facility]- now has patient assistance through Dr. Keane Police office. Bidil has been effective -  Hypertension  has been controlled on metoprolol irbesartan and furosemide.  hypothyroidism on Synthroid 150 mcg oral tablet since Rv with Dr [name].     A sleep study had been obtained on 22 July 2014 but the patient was diagnosed with rather mild apnea at an AHI of 11.0 which exacerbated during REM sleep to 28.4.  There was no supine exacerbation.  The patient briefly retain CO2 but a diagnosis of hypercapnia could not be made.  He also did not appear hypoxemic.  Due to his cardiac condition comorbidities he was asked to return for a CPAP titration which was performed on 07 September 2014.  He was titrated to 9 cm of water CPAP under which she slept 411 minutes and regained 27 minutes of REM sleep.  He also used an air fit P 10 nasal pillow mask but at home developed soon after difficulties with keeping the mask in place.  The nasal pillow slipped frequently and he had trouble regaining sleep when he woke up from air leaks.   Sleep habits are as follows: Bedtime is usually between midnight and 2 AM for this gentleman, who has always described himself as a "night owl". And asleep.  Of estimated 6 hours of sleep he has to go to the bathroom 4 times.  He does get back to sleep relatively easily.  Sleep supine or on his sides, on to firm pillows.  The bedroom is described as cool, quiet and dark, but couple sleeps in 2 different bedrooms as  they have different circadian rhythms. He wakes spontaneously at 8.30 - 9 AM.  He has a similar sleep habit on weekends, he  takes 2-hour afternoon naps  1-3 times a week   REVIEW OF SYSTEMS: Full 14 system review of systems performed and notable only for those listed, all others are neg:  Constitutional: neg  Cardiovascular: Cardiomyopathy Ear/Nose/Throat: neg  Skin: neg Eyes: neg Respiratory: Asthma Gastroitestinal: neg  Hematology/Lymphatic: neg  Endocrine: neg Musculoskeletal:neg Allergy/Immunology: neg Neurological: neg Psychiatric: neg Sleep : Obstructive sleep  apnea with CPAP   ALLERGIES: Allergies  Allergen Reactions  . Keflex [Cephalexin] Anaphylaxis    HOME MEDICATIONS: Outpatient Medications Prior to Visit  Medication Sig Dispense Refill  . amLODipine (NORVASC) 10 MG tablet TK 1 T PO D  2  . docusate sodium (COLACE) 100 MG capsule Take 200 mg by mouth daily.     . furosemide (LASIX) 20 MG tablet Take 1 tablet (20 mg total) by mouth daily. (Patient taking differently: Take 20 mg by mouth daily as needed for fluid or edema. ) 30 tablet   . guaiFENesin (MUCINEX) 600 MG 12 hr tablet Take 2 tablets (1,200 mg total) by mouth 2 (two) times daily. 60 tablet 0  . ipratropium-albuterol (DUONEB) 0.5-2.5 (3) MG/3ML SOLN Take 3 mLs by nebulization every 4 (four) hours. 360 mL 0  . levothyroxine (SYNTHROID, LEVOTHROID) 137 MCG tablet Take 137 mcg by mouth daily before breakfast.    . montelukast (SINGULAIR) 10 MG tablet Take 10 mg by mouth at bedtime.    . Omega-3 Fatty Acids (FISH OIL) 1000 MG CAPS Take 3,000 mg by mouth daily.    Marland Kitchen oxyCODONE (ROXICODONE) 5 MG immediate release tablet Take 1 tablet (5 mg total) by mouth every 4 (four) hours as needed for severe pain. 45 tablet 0  . VENTOLIN HFA 108 (90 Base) MCG/ACT inhaler Inhale 2 puffs into the lungs every 4 (four) hours as needed.    . cetirizine (ZYRTEC) 10 MG tablet Take 10 mg by mouth daily as needed.     . metoprolol succinate (TOPROL-XL) 100 MG 24 hr tablet     . predniSONE (DELTASONE) 5 MG tablet Take 5 mg by mouth as needed.   0  . albuterol (PROVENTIL) (2.5 MG/3ML) 0.083% nebulizer solution Take 3 mLs (2.5 mg total) by nebulization every 2 (two) hours as needed for wheezing or shortness of breath (Albuterol Nebs used for Inhaler per [place]). 75 mL 12  . irbesartan (AVAPRO) 75 MG tablet Take 75 mg by mouth daily.    Marland Kitchen levothyroxine (SYNTHROID, LEVOTHROID) 150 MCG tablet Take 200 mcg by mouth daily before breakfast.     No facility-administered medications prior to visit.      PAST MEDICAL HISTORY: Past Medical History:  Diagnosis Date  . Acute combined systolic and diastolic heart failure ([place]) 03/11/2013  . Allergy   . Arthritis    right knee  . Asthma   . Back pain   . Cardiomyopathy ([place]) 2014   "viral"  . Chronic kidney disease   . Colon polyps 2003, 2015   2003: hyperplastic. 05/2014: tubular adenoma  . Complication of anesthesia    problems urinating after anesthesia  . Diverticulosis of colon 2003, 2015  . Hypertension   . Hypothyroidism    had Graves' disease - thyroid ablation  . MRSA (methicillin resistant Staphylococcus aureus) infection    lumbar-2015  . Obstructive sleep apnea syndrome 09/22/2014   uses cpap  . S/P radioactive  iodine thyroid ablation 06/04/2017  . S/P radioactive iodine thyroid ablation- for Graves disease 06/04/2017  . Substance abuse ([place])    not in many years  . Wears glasses   . Wears partial dentures    top    PAST SURGICAL HISTORY: Past Surgical History:  Procedure Laterality Date  . ANTERIOR CERVICAL DECOMP/DISCECTOMY FUSION N/A 07/30/2013   Procedure: CERVICAL FIVE,CERVICAL SIX CORPECTOMY,ANTERIOR ARTHRODESIS,PEEK INTERBODY GRAFT CERVICAL FOUR-CERVICAL SEVEN,ANTERIOR INSTRUMENTATION;  Surgeon: Winfield Cunas, MD;  Location: [place] [facility];  Service: Neurosurgery;  Laterality: N/A;  . ANTERIOR CERVICAL DECOMP/DISCECTOMY FUSION N/A 04/16/2015   Procedure: CERVICAL THREE-FOUR ANTERIOR CERVICAL DECOMPRESSION/DISCECTOMY FUSION 1 LEVEL;  Surgeon: Ashok Pall, MD;  Location: [place] [facility];  Service: Neurosurgery;  Laterality: N/A;  C34 anterior cervical decompression with fusion plating and bonegraft  . BACK SURGERY  1/15   lumb fusion  . CARDIAC CATHETERIZATION  2014  . CARPAL TUNNEL RELEASE Right 03/23/2014   Procedure: RIGHT CARPAL TUNNEL RELEASE;  Surgeon: Tennis Must, MD;  Location: [facility];  Service: Orthopedics;  Laterality: Right;  . COLONOSCOPY  2003, 2015  . LEFT AND RIGHT HEART  CATHETERIZATION WITH CORONARY ANGIOGRAM N/A 03/11/2013   Procedure: LEFT AND RIGHT HEART CATHETERIZATION WITH CORONARY ANGIOGRAM;  Surgeon: Laverda Page, MD;  Location: [facility];  Service: Cardiovascular;  Laterality: N/A;  . LUMBAR WOUND DEBRIDEMENT N/A 11/07/2013   Procedure: LUMBAR WOUND DEBRIDEMENT;  Surgeon: Winfield Cunas, MD;  Location: [place] [facility];  Service: Neurosurgery;  Laterality: N/A;  . TONSILLECTOMY  1958    FAMILY HISTORY: Family History  Problem Relation Age of Onset  . Diabetes Mother   . Hypertension Mother   . Pancreatic cancer Mother   . Colon cancer Neg Hx     SOCIAL HISTORY: Social History   Socioeconomic History  . Marital status: Married    Spouse name: Rise Paganini  . Number of children: 4  . Years of education: College  . Highest education level: Not on file  Occupational History    Employer: [employer]   Social Needs  . Financial resource strain: Not on file  . Food insecurity:    Worry: Not on file    Inability: Not on file  . Transportation needs:    Medical: Not on file    Non-medical: Not on file  Tobacco Use  . Smoking status: Former Smoker    Years: 0.00    Last attempt to quit: 08/14/1995    Years since quitting: 22.5  . Smokeless tobacco: Never Used  Substance and Sexual Activity  . Alcohol use: No    Alcohol/week: 0.0 oz  . Drug use: No  . Sexual activity: Yes  Lifestyle  . Physical activity:    Days per week: Not on file    Minutes per session: Not on file  . Stress: Not on file  Relationships  . Social connections:    Talks on phone: Not on file    Gets together: Not on file    Attends religious service: Not on file    Active member of club or organization: Not on file    Attends meetings of clubs or organizations: Not on file    Relationship status: Not on file  . Intimate partner violence:    Fear of current or ex partner: Not on file    Emotionally abused: Not on file    Physically abused: Not on file     Forced sexual activity: Not on file  Other Topics Concern  . 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   Patient has four adult children.   Patient is disabled.   Patient has college education.   Patient is right-handed.   Patient drinks very little caffeine.     PHYSICAL EXAM  Vitals:   03/06/18 0912  BP: 135/77  Pulse: 88  Weight: 214 lb 12.8 oz (97.4 kg)  Height: 6\' 2"  (1.88 m)   Body mass index is 27.58 kg/m.  Generalized: Well developed, in no acute distress  Head: normocephalic and atraumatic,. Oropharynx benign [place] 7 Neck: Supple, circumference 17.5 Lungs clear Musculoskeletal: No deformity  Skin no edema Neurological examination   Mentation: Alert oriented to time, place, history taking. Attention span and concentration appropriate. Recent and remote memory intact.  Follows all commands speech and language fluent.   Cranial nerve II-XII: Pupils were equal round reactive to light extraocular movements were full, visual field were full on confrontational test. Facial sensation and strength were normal. hearing was intact to finger rubbing bilaterally. Uvula tongue midline. head turning and shoulder shrug were normal and symmetric.Tongue protrusion into cheek strength was normal. Motor: normal bulk and tone, full strength in the BUE, BLE, Sensory: normal and symmetric to light touch,  Coordination: finger-nose-finger,  no dysmetria Gait and Station: Rising up from seated position without assistance, normal stance,  moderate stride, good arm swing, smooth turning, able to perform tiptoe, and heel walking without difficulty. Tandem gait is steady  DIAGNOSTIC DATA (LABS, IMAGING, TESTING) - I reviewed patient records, labs, notes, testing and imaging myself where available.  Lab Results  Component Value Date   WBC 3.6 (L) 06/04/2017   HGB 15.1 06/04/2017   HCT 44.8 06/04/2017   MCV 84.5 06/04/2017    PLT 266 06/04/2017      Component Value Date/Time   NA 137 06/04/2017 0156   K 4.4 06/04/2017 0156   CL 103 06/04/2017 0156   CO2 23 06/04/2017 0156   GLUCOSE 121 (H) 06/04/2017 0156   BUN 20 06/04/2017 0156   CREATININE 0.92 06/04/2017 0156   CALCIUM 9.9 06/04/2017 0156   PROT 6.0 (L) 04/11/2016 0409   PROT 7.1 06/03/2013 1039   ALBUMIN 3.4 (L) 04/11/2016 0409   AST 16 04/11/2016 0409   ALT 14 (L) 04/11/2016 0409   ALKPHOS 67 04/11/2016 0409   BILITOT 0.7 04/11/2016 0409   GFRNONAA >60 06/04/2017 0156   GFRAA >60 06/04/2017 0156    Lab Results  Component Value Date   VITAMINB12 >1999 (H) 06/03/2013   Lab Results  Component Value Date   TSH <0.010 (L) 06/04/2017      ASSESSMENT AND PLAN  67 y.o. year old male  has a past medical history of Acute combined systolic and diastolic heart failure ([place]) (03/11/2013),  Back pain, Cardiomyopathy ([place]) (2014), Chronic kidney disease,  Hypertension, Hypothyroidism, MRSA (methicillin resistant Staphylococcus aureus) infection, Obstructive sleep apnea syndrome (09/22/2014), Substance abuse ([place]), and newly diagnosed obstructive sleep apnea here for CPAP compliance.CPAP data dated 02/03/2018-03/04/2018 shows greater than 4 hours at 60%.  Average usage 4 hours 19 minutes.  Set pressure 5 to 12 cm.  EPR level 2 AHI 7.9.  CPAP compliance 60%  Use machine for longer period each night No change in settings  Follow up in 4 months Dennie Bible, [facility], [facility], [facility] [address] [place], [place] [address] [phone]

## 2018-03-06 ENCOUNTER — Encounter: Payer: Self-pay | Admitting: Nurse Practitioner

## 2018-03-06 ENCOUNTER — Ambulatory Visit (INDEPENDENT_AMBULATORY_CARE_PROVIDER_SITE_OTHER): Payer: Medicare Other | Admitting: Nurse Practitioner

## 2018-03-06 VITALS — BP 135/77 | HR 88 | Ht 74.0 in | Wt 214.8 lb

## 2018-03-06 DIAGNOSIS — G4733 Obstructive sleep apnea (adult) (pediatric): Secondary | ICD-10-CM

## 2018-03-06 NOTE — Patient Instructions (Signed)
CPAP compliance 60%  Use machine for longer period each night No change in settings  Follow up in 4 months

## 2018-03-15 NOTE — Progress Notes (Signed)
I agree with the assessment and plan as directed by NP .The patient is known to me .   Natividad Halls, MD  

## 2018-04-03 ENCOUNTER — Ambulatory Visit
Admission: RE | Admit: 2018-04-03 | Discharge: 2018-04-03 | Disposition: A | Discharge status: Home / Self Care | Payer: Medicare Other | Source: Ambulatory Visit | Attending: Internal Medicine | Admitting: Internal Medicine

## 2018-04-03 ENCOUNTER — Other Ambulatory Visit: Payer: Self-pay | Admitting: Internal Medicine

## 2018-04-03 DIAGNOSIS — M5136 Other intervertebral disc degeneration, lumbar region: Secondary | ICD-10-CM

## 2018-04-03 DIAGNOSIS — M5134 Other intervertebral disc degeneration, thoracic region: Secondary | ICD-10-CM | POA: Diagnosis not present

## 2018-04-03 DIAGNOSIS — M5137 Other intervertebral disc degeneration, lumbosacral region: Secondary | ICD-10-CM | POA: Diagnosis not present

## 2018-04-22 DIAGNOSIS — I5032 Chronic diastolic (congestive) heart failure: Secondary | ICD-10-CM | POA: Diagnosis not present

## 2018-04-22 DIAGNOSIS — I878 Other specified disorders of veins: Secondary | ICD-10-CM | POA: Diagnosis not present

## 2018-04-22 DIAGNOSIS — I420 Dilated cardiomyopathy: Secondary | ICD-10-CM | POA: Diagnosis not present

## 2018-04-22 DIAGNOSIS — I10 Essential (primary) hypertension: Secondary | ICD-10-CM | POA: Diagnosis not present

## 2018-06-28 DIAGNOSIS — I5022 Chronic systolic (congestive) heart failure: Secondary | ICD-10-CM | POA: Diagnosis not present

## 2018-06-28 DIAGNOSIS — Z23 Encounter for immunization: Secondary | ICD-10-CM | POA: Diagnosis not present

## 2018-06-28 DIAGNOSIS — M549 Dorsalgia, unspecified: Secondary | ICD-10-CM | POA: Diagnosis not present

## 2018-07-08 NOTE — Progress Notes (Deleted)
[facility]  PATIENT: Peter [name] DOB: June 19, 1951   REASON FOR VISIT: Follow-up for CPAP  compliance HISTORY FROM: Patient    HISTORY OF PRESENT ILLNESS:UPDATE 6/12/2019CM PeterHines, 67 year old male returns for follow-up with Mrs. of obstructive sleep apnea.  He also has a history of progressive cardiomyopathy and asthma.  He states he is still getting used to the machine, sometimes he has more breathing difficulty and others.  CPAP data dated 02/03/2018-03/04/2018 shows greater than 4 hours at 60%.  Average usage 4 hours 19 minutes.  Set pressure 5 to 12 cm.  EPR level 2 AHI 7.9.  He returns for reevaluation  10/02/17 Peter [name] is a 67 y.o. male , seen here as in a referral  from Dr. [name], Dr. Einar Gip, and Dr. Wynelle Cleveland for a new evaluation of sleep apnea.   I had the pleasure of meeting with Mr. to send a in late summer 2015 after he was referred by his cardiologist.  At that time he had also another primary neurologist in our office, Dr. Krista Blue, who evaluated and treated him for back pain.  Later he had surgery with Dr. Ashok Pall.  Peter [name]. is a meanwhile 67 year old gentleman with a history of asthma, progressive cardiomyopathy at this time estimated ejection fraction is 15% admitted to hospital in September 2015, most recent ejection fraction was 55%, he also has hypothyroidism, and when evaluated in the previous sleep study he was taking narcotic pain medications for chronic back pain.  While the patient was hospitalized in September 2018 under Dr. Valentina Gu care he was found to require BiPAP and needed additional oxygen while an inpatient.  Dr. Keane Police last notes there was no hypoxia noted during the visit and he continued on Singulair DuoNeb.  He was using albuterol 4 hours prior to admission to the hospital and it did not work for him.  He could not afford to [facility]- now has patient assistance through Dr. Keane Police office. Bidil has been effective -  Hypertension  has been controlled on metoprolol irbesartan and furosemide.  hypothyroidism on Synthroid 150 mcg oral tablet since Rv with Dr [name].     A sleep study had been obtained on 22 July 2014 but the patient was diagnosed with rather mild apnea at an AHI of 11.0 which exacerbated during REM sleep to 28.4.  There was no supine exacerbation.  The patient briefly retain CO2 but a diagnosis of hypercapnia could not be made.  He also did not appear hypoxemic.  Due to his cardiac condition comorbidities he was asked to return for a CPAP titration which was performed on 07 September 2014.  He was titrated to 9 cm of water CPAP under which she slept 411 minutes and regained 27 minutes of REM sleep.  He also used an air fit P 10 nasal pillow mask but at home developed soon after difficulties with keeping the mask in place.  The nasal pillow slipped frequently and he had trouble regaining sleep when he woke up from air leaks.   Sleep habits are as follows: Bedtime is usually between midnight and 2 AM for this gentleman, who has always described himself as a "night owl". And asleep.  Of estimated 6 hours of sleep he has to go to the bathroom 4 times.  He does get back to sleep relatively easily.  Sleep supine or on his sides, on to firm pillows.  The bedroom is described as cool, quiet and dark, but couple sleeps in 2 different bedrooms as  they have different circadian rhythms. He wakes spontaneously at 8.30 - 9 AM.  He has a similar sleep habit on weekends, he  takes 2-hour afternoon naps  1-3 times a week   REVIEW OF SYSTEMS: Full 14 system review of systems performed and notable only for those listed, all others are neg:  Constitutional: neg  Cardiovascular: Cardiomyopathy Ear/Nose/Throat: neg  Skin: neg Eyes: neg Respiratory: Asthma Gastroitestinal: neg  Hematology/Lymphatic: neg  Endocrine: neg Musculoskeletal:neg Allergy/Immunology: neg Neurological: neg Psychiatric: neg Sleep : Obstructive sleep  apnea with CPAP   ALLERGIES: Allergies  Allergen Reactions  . Keflex [Cephalexin] Anaphylaxis    HOME MEDICATIONS: Outpatient Medications Prior to Visit  Medication Sig Dispense Refill  . amLODipine (NORVASC) 10 MG tablet TK 1 T PO D  2  . cetirizine (ZYRTEC) 10 MG tablet Take 10 mg by mouth daily as needed.     . docusate sodium (COLACE) 100 MG capsule Take 200 mg by mouth daily.     . furosemide (LASIX) 20 MG tablet Take 1 tablet (20 mg total) by mouth daily. (Patient taking differently: Take 20 mg by mouth daily as needed for fluid or edema. ) 30 tablet   . guaiFENesin (MUCINEX) 600 MG 12 hr tablet Take 2 tablets (1,200 mg total) by mouth 2 (two) times daily. 60 tablet 0  . ipratropium-albuterol (DUONEB) 0.5-2.5 (3) MG/3ML SOLN Take 3 mLs by nebulization every 4 (four) hours. 360 mL 0  . levothyroxine (SYNTHROID, LEVOTHROID) 137 MCG tablet Take 137 mcg by mouth daily before breakfast.    . metoprolol succinate (TOPROL-XL) 100 MG 24 hr tablet     . montelukast (SINGULAIR) 10 MG tablet Take 10 mg by mouth at bedtime.    . Omega-3 Fatty Acids (FISH OIL) 1000 MG CAPS Take 3,000 mg by mouth daily.    Marland Kitchen oxyCODONE (ROXICODONE) 5 MG immediate release tablet Take 1 tablet (5 mg total) by mouth every 4 (four) hours as needed for severe pain. 45 tablet 0  . predniSONE (DELTASONE) 5 MG tablet Take 5 mg by mouth as needed.   0  . VENTOLIN HFA 108 (90 Base) MCG/ACT inhaler Inhale 2 puffs into the lungs every 4 (four) hours as needed.     No facility-administered medications prior to visit.     PAST MEDICAL HISTORY: Past Medical History:  Diagnosis Date  . Acute combined systolic and diastolic heart failure ([place]) 03/11/2013  . Allergy   . Arthritis    right knee  . Asthma   . Back pain   . Cardiomyopathy ([place]) 2014   "viral"  . Chronic kidney disease   . Colon polyps 2003, 2015   2003: hyperplastic. 05/2014: tubular adenoma  . Complication of anesthesia    problems urinating after  anesthesia  . Diverticulosis of colon 2003, 2015  . Hypertension   . Hypothyroidism    had Graves' disease - thyroid ablation  . MRSA (methicillin resistant Staphylococcus aureus) infection    lumbar-2015  . Obstructive sleep apnea syndrome 09/22/2014   uses cpap  . S/P radioactive iodine thyroid ablation 06/04/2017  . S/P radioactive iodine thyroid ablation- for Graves disease 06/04/2017  . Substance abuse ([place])    not in many years  . Wears glasses   . Wears partial dentures    top    PAST SURGICAL HISTORY: Past Surgical History:  Procedure Laterality Date  . ANTERIOR CERVICAL DECOMP/DISCECTOMY FUSION N/A 07/30/2013   Procedure: CERVICAL FIVE,CERVICAL SIX CORPECTOMY,ANTERIOR ARTHRODESIS,PEEK INTERBODY GRAFT CERVICAL FOUR-CERVICAL Nickola Major  INSTRUMENTATION;  Surgeon: Winfield Cunas, MD;  Location: [place] [facility];  Service: Neurosurgery;  Laterality: N/A;  . ANTERIOR CERVICAL DECOMP/DISCECTOMY FUSION N/A 04/16/2015   Procedure: CERVICAL THREE-FOUR ANTERIOR CERVICAL DECOMPRESSION/DISCECTOMY FUSION 1 LEVEL;  Surgeon: Ashok Pall, MD;  Location: [place] [facility];  Service: Neurosurgery;  Laterality: N/A;  C34 anterior cervical decompression with fusion plating and bonegraft  . BACK SURGERY  1/15   lumb fusion  . CARDIAC CATHETERIZATION  2014  . CARPAL TUNNEL RELEASE Right 03/23/2014   Procedure: RIGHT CARPAL TUNNEL RELEASE;  Surgeon: Tennis Must, MD;  Location: [facility];  Service: Orthopedics;  Laterality: Right;  . COLONOSCOPY  2003, 2015  . LEFT AND RIGHT HEART CATHETERIZATION WITH CORONARY ANGIOGRAM N/A 03/11/2013   Procedure: LEFT AND RIGHT HEART CATHETERIZATION WITH CORONARY ANGIOGRAM;  Surgeon: Laverda Page, MD;  Location: [facility] C[facility] LAB;  Service: Cardiovascular;  Laterality: N/A;  . LUMBAR WOUND DEBRIDEMENT N/A 11/07/2013   Procedure: LUMBAR WOUND DEBRIDEMENT;  Surgeon: Winfield Cunas, MD;  Location: [place] [facility];  Service: Neurosurgery;  Laterality: N/A;  .  TONSILLECTOMY  1958    FAMILY HISTORY: Family History  Problem Relation Age of Onset  . Diabetes Mother   . Hypertension Mother   . Pancreatic cancer Mother   . Colon cancer Neg Hx     SOCIAL HISTORY: Social History   Socioeconomic History  . Marital status: Married    Spouse name: Rise Paganini  . Number of children: 4  . Years of education: College  . Highest education level: Not on file  Occupational History    Employer: [employer]   Social Needs  . Financial resource strain: Not on file  . Food insecurity:    Worry: Not on file    Inability: Not on file  . Transportation needs:    Medical: Not on file    Non-medical: Not on file  Tobacco Use  . Smoking status: Former Smoker    Years: 0.00    Last attempt to quit: 08/14/1995    Years since quitting: 22.9  . Smokeless tobacco: Never Used  Substance and Sexual Activity  . Alcohol use: No    Alcohol/week: 0.0 standard drinks  . Drug use: No  . Sexual activity: Yes  Lifestyle  . Physical activity:    Days per week: Not on file    Minutes per session: Not on file  . Stress: Not on file  Relationships  . Social connections:    Talks on phone: Not on file    Gets together: Not on file    Attends religious service: Not on file    Active member of club or organization: Not on file    Attends meetings of clubs or organizations: Not on file    Relationship status: Not on file  . Intimate partner violence:    Fear of current or ex partner: Not on file    Emotionally abused: Not on file    Physically abused: Not on file    Forced sexual activity: Not on file  Other Topics Concern  . 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   Patient has four adult children.   Patient is disabled.   Patient has college education.   Patient is right-handed.   Patient drinks very little caffeine.     PHYSICAL EXAM  There were no vitals filed for this visit. There is  no  height or weight on file to calculate BMI.  Generalized: Well developed, in no acute distress  Head: normocephalic and atraumatic,. Oropharynx benign mallopatti 7 Neck: Supple, circumference 17.5 Lungs clear Musculoskeletal: No deformity  Skin no edema Neurological examination   Mentation: Alert oriented to time, place, history taking. Attention span and concentration appropriate. Recent and remote memory intact.  Follows all commands speech and language fluent.   Cranial nerve II-XII: Pupils were equal round reactive to light extraocular movements were full, visual field were full on confrontational test. Facial sensation and strength were normal. hearing was intact to finger rubbing bilaterally. Uvula tongue midline. head turning and shoulder shrug were normal and symmetric.Tongue protrusion into cheek strength was normal. Motor: normal bulk and tone, full strength in the BUE, BLE, Sensory: normal and symmetric to light touch,  Coordination: finger-nose-finger,  no dysmetria Gait and Station: Rising up from seated position without assistance, normal stance,  moderate stride, good arm swing, smooth turning, able to perform tiptoe, and heel walking without difficulty. Tandem gait is steady  DIAGNOSTIC DATA (LABS, IMAGING, TESTING) - I reviewed patient records, labs, notes, testing and imaging myself where available.  Lab Results  Component Value Date   WBC 3.6 (L) 06/04/2017   HGB 15.1 06/04/2017   HCT 44.8 06/04/2017   MCV 84.5 06/04/2017   PLT 266 06/04/2017      Component Value Date/Time   NA 137 06/04/2017 0156   K 4.4 06/04/2017 0156   CL 103 06/04/2017 0156   CO2 23 06/04/2017 0156   GLUCOSE 121 (H) 06/04/2017 0156   BUN 20 06/04/2017 0156   CREATININE 0.92 06/04/2017 0156   CALCIUM 9.9 06/04/2017 0156   PROT 6.0 (L) 04/11/2016 0409   PROT 7.1 06/03/2013 1039   ALBUMIN 3.4 (L) 04/11/2016 0409   AST 16 04/11/2016 0409   ALT 14 (L) 04/11/2016 0409   ALKPHOS 67  04/11/2016 0409   BILITOT 0.7 04/11/2016 0409   GFRNONAA >60 06/04/2017 0156   GFRAA >60 06/04/2017 0156    Lab Results  Component Value Date   VITAMINB12 >1999 (H) 06/03/2013   Lab Results  Component Value Date   TSH <0.010 (L) 06/04/2017      ASSESSMENT AND PLAN  67 y.o. year old male  has a past medical history of Acute combined systolic and diastolic heart failure ([place]) (03/11/2013),  Back pain, Cardiomyopathy ([place]) (2014), Chronic kidney disease,  Hypertension, Hypothyroidism, MRSA (methicillin resistant Staphylococcus aureus) infection, Obstructive sleep apnea syndrome (09/22/2014), Substance abuse ([place]), and newly diagnosed obstructive sleep apnea here for CPAP compliance.CPAP data dated 02/03/2018-03/04/2018 shows greater than 4 hours at 60%.  Average usage 4 hours 19 minutes.  Set pressure 5 to 12 cm.  EPR level 2 AHI 7.9.  CPAP compliance 60%  Use machine for longer period each night No change in settings  Follow up in 4 months Dennie Bible, [facility], [facility], [facility] [address] [place], [place] [address] [phone]

## 2018-07-09 ENCOUNTER — Ambulatory Visit: Payer: Medicare Other | Admitting: Nurse Practitioner

## 2018-07-11 ENCOUNTER — Other Ambulatory Visit: Payer: Self-pay | Admitting: Neurology

## 2018-07-11 DIAGNOSIS — G4733 Obstructive sleep apnea (adult) (pediatric): Secondary | ICD-10-CM

## 2018-07-11 DIAGNOSIS — J4550 Severe persistent asthma, uncomplicated: Secondary | ICD-10-CM

## 2018-07-11 DIAGNOSIS — F1190 Opioid use, unspecified, uncomplicated: Secondary | ICD-10-CM

## 2018-07-11 DIAGNOSIS — I5042 Chronic combined systolic (congestive) and diastolic (congestive) heart failure: Secondary | ICD-10-CM

## 2018-07-11 DIAGNOSIS — G4737 Central sleep apnea in conditions classified elsewhere: Secondary | ICD-10-CM

## 2018-07-11 DIAGNOSIS — I5041 Acute combined systolic (congestive) and diastolic (congestive) heart failure: Secondary | ICD-10-CM

## 2018-11-05 DIAGNOSIS — M25661 Stiffness of right knee, not elsewhere classified: Secondary | ICD-10-CM | POA: Diagnosis not present

## 2018-11-05 DIAGNOSIS — M545 Low back pain: Secondary | ICD-10-CM | POA: Diagnosis not present

## 2018-11-05 DIAGNOSIS — M2560 Stiffness of unspecified joint, not elsewhere classified: Secondary | ICD-10-CM | POA: Diagnosis not present

## 2018-11-05 DIAGNOSIS — M25561 Pain in right knee: Secondary | ICD-10-CM | POA: Diagnosis not present

## 2018-11-07 DIAGNOSIS — M25661 Stiffness of right knee, not elsewhere classified: Secondary | ICD-10-CM | POA: Diagnosis not present

## 2018-11-07 DIAGNOSIS — M2560 Stiffness of unspecified joint, not elsewhere classified: Secondary | ICD-10-CM | POA: Diagnosis not present

## 2018-11-07 DIAGNOSIS — M25561 Pain in right knee: Secondary | ICD-10-CM | POA: Diagnosis not present

## 2018-11-07 DIAGNOSIS — M545 Low back pain: Secondary | ICD-10-CM | POA: Diagnosis not present

## 2018-11-11 DIAGNOSIS — M25661 Stiffness of right knee, not elsewhere classified: Secondary | ICD-10-CM | POA: Diagnosis not present

## 2018-11-11 DIAGNOSIS — M2560 Stiffness of unspecified joint, not elsewhere classified: Secondary | ICD-10-CM | POA: Diagnosis not present

## 2018-11-11 DIAGNOSIS — M545 Low back pain: Secondary | ICD-10-CM | POA: Diagnosis not present

## 2018-11-11 DIAGNOSIS — M25561 Pain in right knee: Secondary | ICD-10-CM | POA: Diagnosis not present

## 2018-11-14 DIAGNOSIS — M25561 Pain in right knee: Secondary | ICD-10-CM | POA: Diagnosis not present

## 2018-11-14 DIAGNOSIS — M2560 Stiffness of unspecified joint, not elsewhere classified: Secondary | ICD-10-CM | POA: Diagnosis not present

## 2018-11-14 DIAGNOSIS — M545 Low back pain: Secondary | ICD-10-CM | POA: Diagnosis not present

## 2018-11-14 DIAGNOSIS — M25661 Stiffness of right knee, not elsewhere classified: Secondary | ICD-10-CM | POA: Diagnosis not present

## 2018-11-18 ENCOUNTER — Other Ambulatory Visit: Payer: Self-pay | Admitting: Cardiology

## 2018-11-18 DIAGNOSIS — M25561 Pain in right knee: Secondary | ICD-10-CM | POA: Diagnosis not present

## 2018-11-18 DIAGNOSIS — M25661 Stiffness of right knee, not elsewhere classified: Secondary | ICD-10-CM | POA: Diagnosis not present

## 2018-11-18 DIAGNOSIS — M545 Low back pain: Secondary | ICD-10-CM | POA: Diagnosis not present

## 2018-11-18 DIAGNOSIS — M2560 Stiffness of unspecified joint, not elsewhere classified: Secondary | ICD-10-CM | POA: Diagnosis not present

## 2018-11-21 DIAGNOSIS — M25661 Stiffness of right knee, not elsewhere classified: Secondary | ICD-10-CM | POA: Diagnosis not present

## 2018-11-21 DIAGNOSIS — M2560 Stiffness of unspecified joint, not elsewhere classified: Secondary | ICD-10-CM | POA: Diagnosis not present

## 2018-11-21 DIAGNOSIS — M25561 Pain in right knee: Secondary | ICD-10-CM | POA: Diagnosis not present

## 2018-11-21 DIAGNOSIS — M545 Low back pain: Secondary | ICD-10-CM | POA: Diagnosis not present

## 2018-11-22 DIAGNOSIS — I5022 Chronic systolic (congestive) heart failure: Secondary | ICD-10-CM | POA: Diagnosis not present

## 2018-11-22 DIAGNOSIS — R110 Nausea: Secondary | ICD-10-CM | POA: Diagnosis not present

## 2018-11-22 DIAGNOSIS — N182 Chronic kidney disease, stage 2 (mild): Secondary | ICD-10-CM | POA: Diagnosis not present

## 2018-11-22 DIAGNOSIS — I130 Hypertensive heart and chronic kidney disease with heart failure and stage 1 through stage 4 chronic kidney disease, or unspecified chronic kidney disease: Secondary | ICD-10-CM | POA: Diagnosis not present

## 2018-11-22 DIAGNOSIS — R42 Dizziness and giddiness: Secondary | ICD-10-CM | POA: Diagnosis not present

## 2018-11-22 DIAGNOSIS — Z6828 Body mass index (BMI) 28.0-28.9, adult: Secondary | ICD-10-CM | POA: Diagnosis not present

## 2018-11-25 DIAGNOSIS — M2560 Stiffness of unspecified joint, not elsewhere classified: Secondary | ICD-10-CM | POA: Diagnosis not present

## 2018-11-25 DIAGNOSIS — M545 Low back pain: Secondary | ICD-10-CM | POA: Diagnosis not present

## 2018-11-25 DIAGNOSIS — M25561 Pain in right knee: Secondary | ICD-10-CM | POA: Diagnosis not present

## 2018-11-25 DIAGNOSIS — M25661 Stiffness of right knee, not elsewhere classified: Secondary | ICD-10-CM | POA: Diagnosis not present

## 2018-11-26 DIAGNOSIS — M25642 Stiffness of left hand, not elsewhere classified: Secondary | ICD-10-CM | POA: Diagnosis not present

## 2018-11-26 DIAGNOSIS — M25641 Stiffness of right hand, not elsewhere classified: Secondary | ICD-10-CM | POA: Diagnosis not present

## 2018-11-26 DIAGNOSIS — M25541 Pain in joints of right hand: Secondary | ICD-10-CM | POA: Diagnosis not present

## 2018-11-26 DIAGNOSIS — I10 Essential (primary) hypertension: Secondary | ICD-10-CM | POA: Diagnosis not present

## 2018-11-26 DIAGNOSIS — M6281 Muscle weakness (generalized): Secondary | ICD-10-CM | POA: Diagnosis not present

## 2018-11-26 DIAGNOSIS — M25542 Pain in joints of left hand: Secondary | ICD-10-CM | POA: Diagnosis not present

## 2018-11-28 DIAGNOSIS — M25661 Stiffness of right knee, not elsewhere classified: Secondary | ICD-10-CM | POA: Diagnosis not present

## 2018-11-28 DIAGNOSIS — M25561 Pain in right knee: Secondary | ICD-10-CM | POA: Diagnosis not present

## 2018-11-28 DIAGNOSIS — M2560 Stiffness of unspecified joint, not elsewhere classified: Secondary | ICD-10-CM | POA: Diagnosis not present

## 2018-11-28 DIAGNOSIS — M545 Low back pain: Secondary | ICD-10-CM | POA: Diagnosis not present

## 2018-12-02 DIAGNOSIS — M545 Low back pain: Secondary | ICD-10-CM | POA: Diagnosis not present

## 2018-12-02 DIAGNOSIS — M25561 Pain in right knee: Secondary | ICD-10-CM | POA: Diagnosis not present

## 2018-12-02 DIAGNOSIS — M2560 Stiffness of unspecified joint, not elsewhere classified: Secondary | ICD-10-CM | POA: Diagnosis not present

## 2018-12-02 DIAGNOSIS — M25661 Stiffness of right knee, not elsewhere classified: Secondary | ICD-10-CM | POA: Diagnosis not present

## 2018-12-03 DIAGNOSIS — M25541 Pain in joints of right hand: Secondary | ICD-10-CM | POA: Diagnosis not present

## 2018-12-03 DIAGNOSIS — M25542 Pain in joints of left hand: Secondary | ICD-10-CM | POA: Diagnosis not present

## 2018-12-03 DIAGNOSIS — M25641 Stiffness of right hand, not elsewhere classified: Secondary | ICD-10-CM | POA: Diagnosis not present

## 2018-12-03 DIAGNOSIS — M6281 Muscle weakness (generalized): Secondary | ICD-10-CM | POA: Diagnosis not present

## 2018-12-09 DIAGNOSIS — M545 Low back pain: Secondary | ICD-10-CM | POA: Diagnosis not present

## 2018-12-09 DIAGNOSIS — M2560 Stiffness of unspecified joint, not elsewhere classified: Secondary | ICD-10-CM | POA: Diagnosis not present

## 2018-12-09 DIAGNOSIS — M25661 Stiffness of right knee, not elsewhere classified: Secondary | ICD-10-CM | POA: Diagnosis not present

## 2018-12-09 DIAGNOSIS — M25561 Pain in right knee: Secondary | ICD-10-CM | POA: Diagnosis not present

## 2018-12-16 ENCOUNTER — Other Ambulatory Visit: Payer: Self-pay | Admitting: Cardiology

## 2019-01-09 ENCOUNTER — Other Ambulatory Visit: Payer: Self-pay | Admitting: Cardiology

## 2019-02-11 ENCOUNTER — Telehealth: Payer: Self-pay | Admitting: Neurology

## 2019-02-11 NOTE — Telephone Encounter (Signed)
Received a notification from [facility] on the pt,  "This is another Trial pap that we never billed for. He hasn't even got his machine plugged in since 12/26/18 and hasn't used since 3/5 and that was only 30 mins. Pt. Still has [facility] as long as he did not get anything anywhere else we can give him another try. Please advise so I can Pick up or new set up and start compliance and get him billing correctly."  Advised [facility] to pick the machine up and if the patient would like to proceed forward in starting the CPAP and using it compliantly we will have to restart the process.

## 2019-02-25 DIAGNOSIS — Z125 Encounter for screening for malignant neoplasm of prostate: Secondary | ICD-10-CM | POA: Diagnosis not present

## 2019-02-25 DIAGNOSIS — E038 Other specified hypothyroidism: Secondary | ICD-10-CM | POA: Diagnosis not present

## 2019-02-25 DIAGNOSIS — E7849 Other hyperlipidemia: Secondary | ICD-10-CM | POA: Diagnosis not present

## 2019-02-25 DIAGNOSIS — R82998 Other abnormal findings in urine: Secondary | ICD-10-CM | POA: Diagnosis not present

## 2019-02-25 DIAGNOSIS — R739 Hyperglycemia, unspecified: Secondary | ICD-10-CM | POA: Diagnosis not present

## 2019-02-25 DIAGNOSIS — I10 Essential (primary) hypertension: Secondary | ICD-10-CM | POA: Diagnosis not present

## 2019-02-25 DIAGNOSIS — E538 Deficiency of other specified B group vitamins: Secondary | ICD-10-CM | POA: Diagnosis not present

## 2019-03-06 ENCOUNTER — Other Ambulatory Visit: Payer: Self-pay

## 2019-03-06 ENCOUNTER — Other Ambulatory Visit: Payer: Self-pay | Admitting: Internal Medicine

## 2019-03-06 ENCOUNTER — Ambulatory Visit
Admission: RE | Admit: 2019-03-06 | Discharge: 2019-03-06 | Disposition: A | Discharge status: Home / Self Care | Payer: Medicare Other | Source: Ambulatory Visit | Attending: Internal Medicine | Admitting: Internal Medicine

## 2019-03-06 DIAGNOSIS — M1712 Unilateral primary osteoarthritis, left knee: Secondary | ICD-10-CM | POA: Diagnosis not present

## 2019-03-06 DIAGNOSIS — M1711 Unilateral primary osteoarthritis, right knee: Secondary | ICD-10-CM | POA: Diagnosis not present

## 2019-03-06 DIAGNOSIS — M1990 Unspecified osteoarthritis, unspecified site: Secondary | ICD-10-CM

## 2019-03-06 DIAGNOSIS — M25461 Effusion, right knee: Secondary | ICD-10-CM | POA: Diagnosis not present

## 2019-04-01 ENCOUNTER — Telehealth: Payer: Self-pay

## 2019-04-14 ENCOUNTER — Ambulatory Visit (INDEPENDENT_AMBULATORY_CARE_PROVIDER_SITE_OTHER): Payer: Medicare Other | Admitting: Cardiology

## 2019-04-14 ENCOUNTER — Encounter: Payer: Self-pay | Admitting: Cardiology

## 2019-04-14 ENCOUNTER — Other Ambulatory Visit: Payer: Self-pay

## 2019-04-14 VITALS — BP 130/83 | Ht 74.0 in | Wt 205.0 lb

## 2019-04-14 DIAGNOSIS — I5032 Chronic diastolic (congestive) heart failure: Secondary | ICD-10-CM | POA: Diagnosis not present

## 2019-04-14 DIAGNOSIS — I10 Essential (primary) hypertension: Secondary | ICD-10-CM

## 2019-04-14 NOTE — Progress Notes (Signed)
Virtual Visit via Video Note: This visit type was conducted due to national recommendations for restrictions regarding the COVID-19 Pandemic (e.g. social distancing).  This format is felt to be most appropriate for this patient at this time.  All issues noted in this document were discussed and addressed.  No physical exam was performed (except for noted visual exam findings with Telehealth visits).  The patient has consented to conduct a Telehealth visit and understands insurance will be billed.   I connected with@, on 04/14/19 at  by a video enabled telemedicine application and verified that I am speaking with the correct person using two identifiers.   I discussed the limitations of evaluation and management by telemedicine and the availability of in person appointments. The patient expressed understanding and agreed to proceed.   I have discussed with patient regarding the safety during COVID Pandemic and steps and precautions to be taken including social distancing, frequent hand wash and use of detergent soap, gels with the patient. I asked the patient to avoid touching mouth, nose, eyes, ears with the hands. I encouraged regular walking around the neighborhood and exercise and regular diet, as long as social distancing can be maintained.  Primary Physician/Referring:  Shon Baton, MD  Patient ID: Peter [name], male    DOB: [birth year], 68 y.o.   MRN: [id number]  Chief Complaint  Patient presents with   Coronary Artery Disease   Follow-up    73yr  HPI:    HPI: Peter [name] is a 68y.o. male  African-American male with a history of nonischemic cardiomyopathy with severe LV systolic dysfunction, cardiac catheterization 03/11/2013 had revealed no significant coronary artery disease, ejection fraction 25%. With aggressive medical therapies ejection fraction has normalized by last echocardiogram in November 2018. He has history of hypertension, very mild hyperlipidemia, statins were  discontinued as he had had markedly elevated CK enzymes and rhabdomyolysis, etiology was not known in 2014. He also has radioactive iodine therapy for hyperthyroidismin 2014, obstructive sleep apnea on CPAP and follows Dr. DRoddie Mc chronic back pain due to DJD.  This is an annual visit, states that he is doing well and blood pressure has been very well controlled and states that his leg edema is also stable and he does still get occasional leg cramps when he walks but mostly well-controlled.  He has no PND orthopnea.  No dizziness or syncope.  He has been using furosemide on a as needed basis only.  Past Medical History:  Diagnosis Date   Abnormal CK 03/11/2013   Acute blood loss anemia 04/11/2016   Acute combined systolic and diastolic heart failure ([facility] 03/11/2013   Allergy    Arthritis    right knee   Asthma    Back pain    Chronic kidney disease    Colon polyps 2003, 2015   2003: hyperplastic. 05/2014: tubular adenoma   Complication of anesthesia    problems urinating after anesthesia   Diverticulosis of colon 2003, 2015   Hypertension    Hypothyroidism    had Graves' disease - thyroid ablation   MRSA (methicillin resistant Staphylococcus aureus) infection    lumbar-2015   Obstructive sleep apnea syndrome 09/22/2014   uses cpap   Opiate use 10/02/2017   S/P radioactive iodine thyroid ablation 06/04/2017   Substance abuse ([facility]    not in many years   Wears glasses    Wears partial dentures    top   Past Surgical History:  Procedure Laterality Date   ANTERIOR  CERVICAL DECOMP/DISCECTOMY FUSION N/A 07/30/2013   Procedure: CERVICAL FIVE,CERVICAL SIX CORPECTOMY,ANTERIOR ARTHRODESIS,PEEK INTERBODY GRAFT CERVICAL FOUR-CERVICAL SEVEN,ANTERIOR INSTRUMENTATION;  Surgeon: Winfield Cunas, MD;  Location: [facility];  Service: Neurosurgery;  Laterality: N/A;   ANTERIOR CERVICAL DECOMP/DISCECTOMY FUSION N/A 04/16/2015   Procedure: CERVICAL THREE-FOUR ANTERIOR CERVICAL  DECOMPRESSION/DISCECTOMY FUSION 1 LEVEL;  Surgeon: Ashok Pall, MD;  Location: [place] [facility];  Service: Neurosurgery;  Laterality: N/A;  C34 anterior cervical decompression with fusion plating and bonegraft   BACK SURGERY  1/15   lumb fusion   CARDIAC CATHETERIZATION  2014   CARPAL TUNNEL RELEASE Right 03/23/2014   Procedure: RIGHT CARPAL TUNNEL RELEASE;  Surgeon: Tennis Must, MD;  Location: [facility];  Service: Orthopedics;  Laterality: Right;   COLONOSCOPY  2003, 2015   LEFT AND RIGHT HEART CATHETERIZATION WITH CORONARY ANGIOGRAM N/A 03/11/2013   Procedure: LEFT AND RIGHT HEART CATHETERIZATION WITH CORONARY ANGIOGRAM;  Surgeon: Laverda Page, MD;  [facility];  Service: Cardiovascular;  Laterality: N/A;   LUMBAR WOUND DEBRIDEMENT N/A 11/07/2013   Procedure: LUMBAR WOUND DEBRIDEMENT;  Surgeon: Winfield Cunas, MD;  Location: [place] [facility];  Service: Neurosurgery;  Laterality: N/A;   TONSILLECTOMY  1958   Social History   Socioeconomic History   Marital status: Married    Spouse name: Rise Paganini   Number of children: 4   Years of education: [employer] education level: Not on file  Occupational History    Employer: [employer] strain: Not on file   Food insecurity    Worry: Not on file    Inability: Not on file   Transportation needs    Medical: Not on file    Non-medical: Not on file  Tobacco Use   Smoking status: Former Smoker    Packs/day: 0.50    Years: 0.00    Pack years: 0.00    Quit date: 08/14/1995    Years since quitting: 23.6   Smokeless tobacco: Never Used  Substance and Sexual Activity   Alcohol use: No    Alcohol/week: 0.0 standard drinks   Drug use: No   Sexual activity: Yes  Lifestyle   Physical activity    Days per week: Not on file    Minutes per session: Not on file   Stress: Not on file  Relationships   Social connections    Talks on phone: Not on file    Gets  together: Not on file    Attends religious service: Not on file    Active member of club or organization: Not on file    Attends meetings of clubs or organizations: Not on file    Relationship status: Not on file   Intimate partner violence    Fear of current or ex partner: Not on file    Emotionally abused: Not on file    Physically abused: Not on file    Forced sexual activity: Not on file  Other Topics Concern   Not on file  Social History Narrative   Patient is married ([place]) and lives at home with his wife.   Patient has four adult children.   Patient is disabled.   Patient has college education.   Patient is right-handed.   Patient drinks very little caffeine.   ROS  Review of Systems  Constitution: Negative for chills, decreased appetite, malaise/fatigue and weight gain.  Cardiovascular: Positive for claudication (mild and stable calf claudiction) and leg  swelling (chronic). Negative for dyspnea on exertion and syncope.  Respiratory: Positive for sleep disturbances due to breathing (sleep apnea).   Endocrine: Negative for cold intolerance.  Hematologic/Lymphatic: Does not bruise/bleed easily.  Musculoskeletal: Positive for back pain (chronic) and joint pain. Negative for joint swelling.  Gastrointestinal: Negative for abdominal pain, anorexia, change in bowel habit, hematochezia and melena.  Neurological: Negative for headaches and light-headedness.  Psychiatric/Behavioral: Negative for depression and substance abuse.  All other systems reviewed and are negative.  Objective  Blood pressure 130/83, height 6' 2"  (1.88 m), weight 205 lb (93 kg). Body mass index is 26.32 kg/m.  Physical exam not performed or limited due to virtual visit.  Patient appeared to be in no distress, Neck was supple, respiration was not labored.  Please see exam details from prior visit is as below.  Physical Exam  Constitutional: He appears well-developed and well-nourished. No distress.    HENT:  Head: Atraumatic.  Eyes: Conjunctivae are normal.  Neck: Neck supple. No JVD present. No thyromegaly present.  Cardiovascular: Normal rate, regular rhythm and normal heart sounds. Exam reveals no gallop.  No murmur heard. Pulses:      Carotid pulses are 2+ on the right side and 2+ on the left side.      Femoral pulses are 2+ on the right side and 2+ on the left side.      Dorsalis pedis pulses are 2+ on the right side and 1+ on the left side.       Posterior tibial pulses are 2+ on the right side and 1+ on the left side.  Bilateral trace edema present.  Pulmonary/Chest: Effort normal and breath sounds normal.  Abdominal: Soft. Bowel sounds are normal.  Musculoskeletal: Normal range of motion.  Neurological: He is alert.  Skin: Skin is warm and dry.  Psychiatric: He has a normal mood and affect.   Radiology: No results found.  Laboratory examination:   Labs 02/21/2018: Serum glucose 95 mg, BUN 15, creatinine 0.9, EGFR greater than 60 ML, potassium 4.9, CMP otherwise normal. HB 13.9/HCT 43.7, platelets 221.  Cholesterol 158, triglycerides 98, HDL 33, LDL 105. Non-HDL cholesterol 125 Apo B normal at 83, TSH low at 0.09. PSA normal.  Labs 08/08/2017: Serum glucose 136, BUN 25, creatinine 1.4, eGFR 61 mL, potassium 4.0.   CMP Latest Ref Rng & Units 06/04/2017 06/03/2017 06/03/2017  Glucose 65 - 99 mg/dL 121(H) - 103(H)  BUN 6 - 20 mg/dL 20 - 15  Creatinine 0.61 - 1.24 mg/dL 0.92 0.89 0.83  Sodium 135 - 145 mmol/L 137 - 141  Potassium 3.5 - 5.1 mmol/L 4.4 - 4.2  Chloride 101 - 111 mmol/L 103 - 108  CO2 22 - 32 mmol/L 23 - 20(L)  Calcium 8.9 - 10.3 mg/dL 9.9 - 9.5  Total Protein 6.5 - 8.1 g/dL - - -  Total Bilirubin 0.3 - 1.2 mg/dL - - -  Alkaline Phos 38 - 126 U/L - - -  AST 15 - 41 U/L - - -  ALT 17 - 63 U/L - - -   CBC Latest Ref Rng & Units 06/04/2017 06/03/2017 06/03/2017  WBC 4.0 - 10.5 K/uL 3.6(L) 4.5 9.3  Hemoglobin 13.0 - 17.0 g/dL 15.1 14.2 15.7  Hematocrit 39.0 -  52.0 % 44.8 42.2 46.5  Platelets 150 - 400 K/uL 266 239 201   Lipid Panel  No results found for: CHOL, TRIG, HDL, CHOLHDL, VLDL, LDLCALC, LDLDIRECT HEMOGLOBIN A1C No results found for: HGBA1C, MPG TSH  No results for input(s): TSH in the last 8760 hours. Medications   Current Outpatient Medications  Medication Instructions   amLODipine (NORVASC) 10 MG tablet TAKE 1 TABLET BY MOUTH DAILY   aspirin EC 81 mg, Oral, Daily   BIDIL 20-37.5 MG tablet 3 times daily   cetirizine (ZYRTEC) 10 mg, Oral, Daily PRN   Cyanocobalamin (VITAMIN B-12) 3000 MCG SUBL Sublingual, Daily   docusate sodium (COLACE) 400 mg, Oral, Daily   Fish Oil 2,000 mg, Oral, Daily   furosemide (LASIX) 20 mg, Oral, Daily   guaiFENesin (MUCINEX) 1,200 mg, Oral, 2 times daily   ipratropium-albuterol (DUONEB) 0.5-2.5 (3) MG/3ML SOLN 3 mLs, Nebulization, Every 4 hours   levothyroxine (SYNTHROID) 150 mcg, Oral, Daily before breakfast   metoprolol succinate (TOPROL-XL) 100 mg, Oral, Daily   montelukast (SINGULAIR) 10 mg, Oral, Daily at bedtime   oxyCODONE (ROXICODONE) 5 mg, Oral, Every 4 hours PRN   predniSONE (DELTASONE) 5 mg, Oral, As needed   rosuvastatin (CRESTOR) 5 MG tablet Daily at bedtime   VENTOLIN HFA 108 (90 Base) MCG/ACT inhaler 2 puffs, Inhalation, Every 4 hours PRN    Cardiac Studies:   Echocardiogram 08/15/2017: LV is normal in size. Moderate concentric hypertrophy of the left ventricle. Normal global wall motion. Visual EF is 55-60%. Normal diastolic filling pattern. Moderate (Grade II) mitral regurgitation. Mild tricuspid regurgitation. Estimated pulmonary artery systolic pressure is 25 mmHg. Mild pulmonic regurgitation. Mitral regurgitation and mild pulmonary hypertension new since last study dated 11/16/2015. Impression: Cardiac catheterization 03/11/2013: Ejection fraction 15%, global hypokinesis, mild luminal irregularity. Normal right heart pressure.  Cardiac catheterization  03/11/2013: Ejection fraction 15%, global hypokinesis, mild luminal irregularity. Normal right heart pressure.. EF 07/14/13: 20-25% Improved on Medical therapy.  Assessment     ICD-10-CM   1. Chronic diastolic (congestive) heart failure (HCC)  I50.32   2. Essential hypertension  I10     EKG 04/20/2018: Normal sinus rhythm at the rate of 79 bpm, normal axis. Her only progression, probably normal variant. No evidence of ischemia otherwise normal EKG. No significant change from EKG 07/13/2017.  Recommendations:   African-American male with a history of nonischemic cardiomyopathy with severe LV systolic dysfunction, cardiac catheterization 03/11/2013 had revealed no significant coronary artery disease, ejection fraction 25%. With aggressive medical therapies ejection fraction has normalized by last echocardiogram in November 2018. He has history of hypertension, very mild hyperlipidemia, statins were discontinued as he had had markedly elevated CK enzymes and rhabdomyolysis, etiology was not known in 2014. He also has radioactive iodine therapy for hyperthyroidismin 2014, obstructive sleep apnea on CPAP and follows Dr. Roddie Mc, chronic back pain due to DJD.  Due to history of rhabdomyolysis, etiology unknown, he is not on any high-dose statin therapy, he is presently doing well and essentially remains asymptomatic except for mild chronic dyspnea and backache continues with a major issue.    From cardiac standpoint he has remained stable over the past 3 years, I will see him back In 1 year. States BiDil has made a big difference.   Adrian Prows, MD, [facility] 04/14/2019, 3:28 PM [facility] [place] Pager: [phone] Office: [phone] If no answer Cell [phone]

## 2019-04-23 ENCOUNTER — Ambulatory Visit: Payer: Self-pay | Admitting: Cardiology

## 2019-05-14 ENCOUNTER — Encounter: Payer: Self-pay | Admitting: Gastroenterology

## 2019-05-22 ENCOUNTER — Telehealth: Payer: Self-pay | Admitting: Gastroenterology

## 2019-05-22 NOTE — Telephone Encounter (Signed)
Pt would like advice on laxatives.  He reported that he is taking opioids and they are interfering with his BMs.

## 2019-05-22 NOTE — Telephone Encounter (Signed)
Spoke to the patient, told the patient he needed to establish care with Dr. Loletha Carrow first then we would be able to refer to Dr. Loletha Carrow for medical advice as then he would be an established patient. The patient said he would speak about this to his PCP first then call back for an OV if need be.

## 2019-05-26 NOTE — Telephone Encounter (Signed)
He needs an echocardiogram to see where his EF is. He has done so well and hence not evaluated since 2018. I will be happy to check an echo and set up OV, but I doube he has ATTR CM

## 2019-05-26 NOTE — Telephone Encounter (Signed)
Pt called and asked if he is at risk for Transthyretin Amyloid Cardiomyopathy (ATTR-CM)

## 2019-05-27 ENCOUNTER — Other Ambulatory Visit: Payer: Self-pay | Admitting: Cardiology

## 2019-05-27 DIAGNOSIS — I5032 Chronic diastolic (congestive) heart failure: Secondary | ICD-10-CM

## 2019-05-27 DIAGNOSIS — R0609 Other forms of dyspnea: Secondary | ICD-10-CM

## 2019-05-27 NOTE — Telephone Encounter (Signed)
S/w pt advised him , transferred to [facility] to make appt for echo and office visit.

## 2019-05-27 NOTE — Progress Notes (Signed)
Patient having worsening dyspnea, Will get echocardiogram. OV later.

## 2019-06-12 DIAGNOSIS — Z23 Encounter for immunization: Secondary | ICD-10-CM | POA: Diagnosis not present

## 2019-06-13 ENCOUNTER — Ambulatory Visit (INDEPENDENT_AMBULATORY_CARE_PROVIDER_SITE_OTHER): Payer: Medicare Other

## 2019-06-13 ENCOUNTER — Other Ambulatory Visit: Payer: Self-pay

## 2019-06-13 DIAGNOSIS — R0609 Other forms of dyspnea: Secondary | ICD-10-CM

## 2019-06-13 DIAGNOSIS — I5032 Chronic diastolic (congestive) heart failure: Secondary | ICD-10-CM

## 2019-06-27 ENCOUNTER — Ambulatory Visit (INDEPENDENT_AMBULATORY_CARE_PROVIDER_SITE_OTHER): Payer: Medicare Other | Admitting: Cardiology

## 2019-06-27 ENCOUNTER — Encounter: Payer: Self-pay | Admitting: Cardiology

## 2019-06-27 ENCOUNTER — Other Ambulatory Visit: Payer: Self-pay

## 2019-06-27 VITALS — BP 114/70 | HR 75 | Temp 97.1°F | Ht 72.0 in | Wt 200.1 lb

## 2019-06-27 DIAGNOSIS — I77810 Thoracic aortic ectasia: Secondary | ICD-10-CM | POA: Diagnosis not present

## 2019-06-27 DIAGNOSIS — I10 Essential (primary) hypertension: Secondary | ICD-10-CM

## 2019-06-27 DIAGNOSIS — R0609 Other forms of dyspnea: Secondary | ICD-10-CM

## 2019-06-27 DIAGNOSIS — R600 Localized edema: Secondary | ICD-10-CM

## 2019-06-27 DIAGNOSIS — R0600 Dyspnea, unspecified: Secondary | ICD-10-CM | POA: Diagnosis not present

## 2019-06-27 DIAGNOSIS — I5032 Chronic diastolic (congestive) heart failure: Secondary | ICD-10-CM

## 2019-06-27 NOTE — Progress Notes (Signed)
Primary Physician/Referring:  Shon Baton, MD  Patient ID: Peter [name], male    DOB: [birth year], 68 y.o.   MRN: [id number]  Chief Complaint  Patient presents with  . Follow-up    Feet swelling, echo results  . Congestive Heart Failure  . Shortness of Breath  . Leg Swelling   HPI:    HPI: Peter [name]  is a 68 y.o. male  African-American male with a history of nonischemic cardiomyopathy with severe LV systolic dysfunction, cardiac catheterization 03/11/2013 had revealed no significant coronary artery disease, ejection fraction 25%. With aggressive medical therapies ejection fraction has normalized by last echocardiogram in November 2018.   He has history of chronic bilateral leg edema, hypertension, very mild hyperlipidemia, statins were discontinued as he had had markedly elevated CK enzymes and rhabdomyolysis, etiology was not known in 2014. He also has radioactive iodine therapy for hyperthyroidismin 2014, obstructive sleep apnea on CPAP and follows Dr. Roddie Mc, chronic back pain due to DJD.   His main complaint is worsening leg edema. No change in dyspnea, no chest pain or palpitations.  He underwent echo recently and presents for f/u. Marland Kitchen  Past Medical History:  Diagnosis Date  . Abnormal CK 03/11/2013  . Acute blood loss anemia 04/11/2016  . Acute combined systolic and diastolic heart failure ([place]) 03/11/2013  . Allergy   . Arthritis    right knee  . Asthma   . Back pain   . Chronic kidney disease   . Colon polyps 2003, 2015   2003: hyperplastic. 05/2014: tubular adenoma  . Complication of anesthesia    problems urinating after anesthesia  . Diverticulosis of colon 2003, 2015  . Hypertension   . Hypothyroidism    had Graves' disease - thyroid ablation  . MRSA (methicillin resistant Staphylococcus aureus) infection    lumbar-2015  . Obstructive sleep apnea syndrome 09/22/2014   uses cpap  . Opiate use 10/02/2017  . S/P radioactive iodine thyroid ablation  06/04/2017  . Substance abuse ([place])    not in many years  . Wears glasses   . Wears partial dentures    top   Past Surgical History:  Procedure Laterality Date  . ANTERIOR CERVICAL DECOMP/DISCECTOMY FUSION N/A 07/30/2013   Procedure: CERVICAL FIVE,CERVICAL SIX CORPECTOMY,ANTERIOR ARTHRODESIS,PEEK INTERBODY GRAFT CERVICAL FOUR-CERVICAL SEVEN,ANTERIOR INSTRUMENTATION;  Surgeon: Winfield Cunas, MD;  Location: [facility];  Service: Neurosurgery;  Laterality: N/A;  . ANTERIOR CERVICAL DECOMP/DISCECTOMY FUSION N/A 04/16/2015   Procedure: CERVICAL THREE-FOUR ANTERIOR CERVICAL DECOMPRESSION/DISCECTOMY FUSION 1 LEVEL;  Surgeon: Ashok Pall, MD;  Location: [facility];  Service: Neurosurgery;  Laterality: N/A;  C34 anterior cervical decompression with fusion plating and bonegraft  . BACK SURGERY  1/15   lumb fusion  . CARDIAC CATHETERIZATION  2014  . CARPAL TUNNEL RELEASE Right 03/23/2014   Procedure: RIGHT CARPAL TUNNEL RELEASE;  Surgeon: Tennis Must, MD;  Location: [facility];  Service: Orthopedics;  Laterality: Right;  . COLONOSCOPY  2003, 2015  . LEFT AND RIGHT HEART CATHETERIZATION WITH CORONARY ANGIOGRAM N/A 03/11/2013   Procedure: LEFT AND RIGHT HEART CATHETERIZATION WITH CORONARY ANGIOGRAM;  Surgeon: Laverda Page, MD;  Location: [facility] CATH LAB;  Service: Cardiovascular;  Laterality: N/A;  . LUMBAR WOUND DEBRIDEMENT N/A 11/07/2013   Procedure: LUMBAR WOUND DEBRIDEMENT;  Surgeon: Winfield Cunas, MD;  Location: [facility];  Service: Neurosurgery;  Laterality: N/A;  . TONSILLECTOMY  1958   Social History   Socioeconomic History  . Marital status: Married  Spouse name: Peter [name]  . Number of children: 4  . Years of education: College  . Highest education level: Not on file  Occupational History    Employer: [employer]   Social Needs  . Financial resource strain: Not on file  . Food insecurity    Worry: Not on file    Inability: Not on file  . Transportation needs     Medical: Not on file    Non-medical: Not on file  Tobacco Use  . Smoking status: Former Smoker    Packs/day: 0.50    Years: 0.00    Pack years: 0.00    Quit date: 08/14/1995    Years since quitting: 23.8  . Smokeless tobacco: Never Used  Substance and Sexual Activity  . Alcohol use: No    Alcohol/week: 0.0 standard drinks  . Drug use: No  . Sexual activity: Yes  Lifestyle  . Physical activity    Days per week: Not on file    Minutes per session: Not on file  . Stress: Not on file  Relationships  . Social Herbalist on phone: Not on file    Gets together: Not on file    Attends religious service: Not on file    Active member of club or organization: Not on file    Attends meetings of clubs or organizations: Not on file    Relationship status: Not on file  . Intimate partner violence    Fear of current or ex partner: Not on file    Emotionally abused: Not on file    Physically abused: Not on file    Forced sexual activity: Not on file  Other Topics Concern  . 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   Patient has four adult children.   Patient is disabled.   Patient has college education.   Patient is right-handed.   Patient drinks very little caffeine.   ROS  Review of Systems  Constitution: Negative for chills, decreased appetite, malaise/fatigue and weight gain.  Cardiovascular: Positive for claudication (mild and stable calf claudiction) and leg swelling (chronic). Negative for dyspnea on exertion and syncope.  Respiratory: Positive for sleep disturbances due to breathing (sleep apnea).   Endocrine: Negative for cold intolerance.  Hematologic/Lymphatic: Does not bruise/bleed easily.  Musculoskeletal: Positive for back pain (chronic) and joint pain. Negative for joint swelling.  Gastrointestinal: Negative for abdominal pain, anorexia, change in bowel habit, hematochezia and melena.   Neurological: Negative for headaches and light-headedness.  Psychiatric/Behavioral: Negative for depression and substance abuse.  All other systems reviewed and are negative.  Objective  Blood pressure 114/70, pulse 75, temperature (!) 97.1 F (36.2 C), height 6' (1.829 m), weight 200 lb 1.6 oz (90.8 kg), SpO2 97 %. Body mass index is 27.14 kg/m.  Physical Exam  Constitutional: He appears well-developed and well-nourished. No distress.  HENT:  Head: Atraumatic.  Eyes: Conjunctivae are normal.  Neck: Neck supple. No JVD present. No thyromegaly present.  Cardiovascular: Normal rate, regular rhythm and normal heart sounds. Exam reveals no gallop.  No murmur heard. Pulses:      Carotid pulses are 2+ on the right side and 2+ on the left side.      Femoral pulses are 2+ on the right side and 2+ on the left side.      Dorsalis pedis pulses are 2+ on the right side and 1+ on the left  side.       Posterior tibial pulses are 2+ on the right side and 1+ on the left side.  Bilateral 2+ pitting edema present. No JVD  Pulmonary/Chest: Effort normal and breath sounds normal.  Abdominal: Soft. Bowel sounds are normal.  Musculoskeletal: Normal range of motion.  Neurological: He is alert.  Skin: Skin is warm and dry.  Psychiatric: He has a normal mood and affect.   Radiology: No results found.  Laboratory examination:   Labs 02/21/2018: Serum glucose 95 mg, BUN 15, creatinine 0.9, EGFR greater than 60 ML, potassium 4.9, CMP otherwise normal. HB 13.9/HCT 43.7, platelets 221.  Cholesterol 158, triglycerides 98, HDL 33, LDL 105. Non-HDL cholesterol 125 Apo B normal at 83, TSH low at 0.09. PSA normal.  Labs 08/08/2017: Serum glucose 136, BUN 25, creatinine 1.4, eGFR 61 mL, potassium 4.0.   CMP Latest Ref Rng & Units 06/04/2017 06/03/2017 06/03/2017  Glucose 65 - 99 mg/dL 121(H) - 103(H)  BUN 6 - 20 mg/dL 20 - 15  Creatinine 0.61 - 1.24 mg/dL 0.92 0.89 0.83  Sodium 135 - 145 mmol/L 137 - 141   Potassium 3.5 - 5.1 mmol/L 4.4 - 4.2  Chloride 101 - 111 mmol/L 103 - 108  CO2 22 - 32 mmol/L 23 - 20(L)  Calcium 8.9 - 10.3 mg/dL 9.9 - 9.5  Total Protein 6.5 - 8.1 g/dL - - -  Total Bilirubin 0.3 - 1.2 mg/dL - - -  Alkaline Phos 38 - 126 U/L - - -  AST 15 - 41 U/L - - -  ALT 17 - 63 U/L - - -   CBC Latest Ref Rng & Units 06/04/2017 06/03/2017 06/03/2017  WBC 4.0 - 10.5 K/uL 3.6(L) 4.5 9.3  Hemoglobin 13.0 - 17.0 g/dL 15.1 14.2 15.7  Hematocrit 39.0 - 52.0 % 44.8 42.2 46.5  Platelets 150 - 400 K/uL 266 239 201   Lipid Panel  No results found for: CHOL, TRIG, HDL, CHOLHDL, VLDL, LDLCALC, LDLDIRECT HEMOGLOBIN A1C No results found for: HGBA1C, MPG TSH No results for input(s): TSH in the last 8760 hours. Medications   Current Outpatient Medications  Medication Instructions  . aspirin EC 81 mg, Oral, Daily  . BIDIL 20-37.5 MG tablet 3 times daily  . cetirizine (ZYRTEC) 10 mg, Oral, Daily PRN  . Cyanocobalamin (VITAMIN B-12) 3000 MCG SUBL Sublingual, Daily  . docusate sodium (COLACE) 400 mg, Oral, Daily  . Fish Oil 2,000 mg, Oral, Daily  . furosemide (LASIX) 20 mg, Oral, Daily  . guaiFENesin (MUCINEX) 1,200 mg, Oral, 2 times daily  . ipratropium-albuterol (DUONEB) 0.5-2.5 (3) MG/3ML SOLN 3 mLs, Nebulization, Every 4 hours  . levothyroxine (SYNTHROID) 150 mcg, Oral, Daily before breakfast  . metoprolol succinate (TOPROL-XL) 100 mg, Oral, Daily  . montelukast (SINGULAIR) 10 mg, Oral, Daily at bedtime  . MOVANTIK 25 MG TABS tablet 1 tablet, Oral, Daily  . oxyCODONE (ROXICODONE) 5 mg, Oral, Every 4 hours PRN  . rosuvastatin (CRESTOR) 5 MG tablet Daily at bedtime  . VENTOLIN HFA 108 (90 Base) MCG/ACT inhaler 2 puffs, Inhalation, Every 4 hours PRN    Cardiac Studies:   Lower extremity venous insufficiency study 06/20/2011:  No evidence of venous insufficiency.  No evidence of DVT.  Cardiac catheterization 03/11/2013: Ejection fraction 15%, global hypokinesis, mild luminal  irregularity. Normal right heart pressure.. EF 07/14/13: 20-25% Improved on Medical therapy.  Echocardiogram 06/13/2019: Left ventricle cavity is normal in size. Moderate concentric hypertrophy of the left ventricle. Normal LV systolic function  with EF 55%. Normal global wall motion. Doppler evidence of grade I (impaired) diastolic dysfunction, normal LAP.  Left atrial cavity is normal in size. Aneurysmal interatrial septum without 2D or color Doppler evidence of interatrial shunt. Mild (Grade I) mitral regurgitation. Mild tricuspid regurgitation.  Mild pulmonic regurgitation. Aortic root 3.9 CM.   Compared to previous study in 2018, aortic root diameter is mildly increased.   Assessment     ICD-10-CM   1. Chronic diastolic (congestive) heart failure (HCC)  I50.32 EKG 12-Lead    PCV ECHOCARDIOGRAM COMPLETE  2. Dyspnea on exertion  R06.00   3. Bilateral leg edema  R60.0    Venous insufficiency stydy negative 2012  4. Essential hypertension  I10   5. Aortic root dilatation (HCC)  I77.810 PCV ECHOCARDIOGRAM COMPLETE  D/C amlodipine due to low BP (may help with leg edema)   EKG 06/27/2019: Normal sinus rhythm at rate of 66 bpm, normal axis.  No evidence of ischemia, normal EKG. No significant change from  EKG 04/20/2018.  Recommendations:   African-American male with a history of nonischemic cardiomyopathy with severe LV systolic dysfunction, cardiac catheterization 03/11/2013 had revealed no significant coronary artery disease, ejection fraction 25%. With aggressive medical therapies ejection fraction has normalized since 2018. He has history of hypertension, very mild hyperlipidemia, statins were discontinued as he had had markedly elevated CK enzymes and rhabdomyolysis, etiology was not known in 2014. He also has radioactive iodine therapy for hyperthyroidismin 2014, obstructive sleep apnea on CPAP and follows Dr. Roddie Mc, chronic back pain due to DJD. Suspect hyperthyroidism to be the  etiology for his rhabdomyolysis.  He is now back on statins.  His leg edema is chronic, I suspect it is related to lack of activity since Covid 19, also it is dependent edema.  He does not have varicose veins.  We discussed regarding increasing his physical activity and also using any type of equipment to keep his feet moving and to use support stockings within 30 minutes of waking up.  His blood pressure was very low when he came in with systolic blood pressure of 92 mmHg.  I have discontinued amlodipine which may help with leg edema as well.  Chronic diastolic heart failure remained stable, he has mild aortic root dilatation, we will repeat his echocardiogram in a year and I'll see him back at that time.  Peter Prows, MD, [facility] 06/27/2019, 11:28 AM [facility] [place] Pager: [phone] Office: [phone] If no answer Cell [phone]

## 2019-09-12 ENCOUNTER — Other Ambulatory Visit: Payer: Self-pay

## 2019-09-12 MED ORDER — ROSUVASTATIN CALCIUM 5 MG PO TABS: 5.0000 mg | ORAL_TABLET | Freq: Every day | ORAL | 3 refills | Status: DC

## 2019-10-26 ENCOUNTER — Other Ambulatory Visit: Payer: Self-pay | Admitting: Cardiology

## 2019-10-29 ENCOUNTER — Other Ambulatory Visit: Payer: Self-pay

## 2019-10-29 MED ORDER — BIDIL 20-37.5 MG PO TABS: 1.0000 | ORAL_TABLET | Freq: Three times a day (TID) | ORAL | 1 refills | Status: DC

## 2020-02-04 DIAGNOSIS — J4520 Mild intermittent asthma, uncomplicated: Secondary | ICD-10-CM | POA: Diagnosis not present

## 2020-02-27 DIAGNOSIS — E538 Deficiency of other specified B group vitamins: Secondary | ICD-10-CM | POA: Diagnosis not present

## 2020-02-27 DIAGNOSIS — E7849 Other hyperlipidemia: Secondary | ICD-10-CM | POA: Diagnosis not present

## 2020-02-27 DIAGNOSIS — Z0000 Encounter for general adult medical examination without abnormal findings: Secondary | ICD-10-CM | POA: Diagnosis not present

## 2020-02-27 DIAGNOSIS — R739 Hyperglycemia, unspecified: Secondary | ICD-10-CM | POA: Diagnosis not present

## 2020-02-27 DIAGNOSIS — Z125 Encounter for screening for malignant neoplasm of prostate: Secondary | ICD-10-CM | POA: Diagnosis not present

## 2020-02-27 DIAGNOSIS — E038 Other specified hypothyroidism: Secondary | ICD-10-CM | POA: Diagnosis not present

## 2020-03-03 DIAGNOSIS — I10 Essential (primary) hypertension: Secondary | ICD-10-CM | POA: Diagnosis not present

## 2020-03-03 DIAGNOSIS — R82998 Other abnormal findings in urine: Secondary | ICD-10-CM | POA: Diagnosis not present

## 2020-03-04 DIAGNOSIS — F11250 Opioid dependence with opioid-induced psychotic disorder with delusions: Secondary | ICD-10-CM | POA: Diagnosis not present

## 2020-03-04 DIAGNOSIS — R627 Adult failure to thrive: Secondary | ICD-10-CM | POA: Diagnosis not present

## 2020-03-04 DIAGNOSIS — E785 Hyperlipidemia, unspecified: Secondary | ICD-10-CM | POA: Diagnosis not present

## 2020-03-04 DIAGNOSIS — I5022 Chronic systolic (congestive) heart failure: Secondary | ICD-10-CM | POA: Diagnosis not present

## 2020-03-04 DIAGNOSIS — I420 Dilated cardiomyopathy: Secondary | ICD-10-CM | POA: Diagnosis not present

## 2020-03-04 DIAGNOSIS — F325 Major depressive disorder, single episode, in full remission: Secondary | ICD-10-CM | POA: Diagnosis not present

## 2020-03-04 DIAGNOSIS — Z1212 Encounter for screening for malignant neoplasm of rectum: Secondary | ICD-10-CM | POA: Diagnosis not present

## 2020-03-04 DIAGNOSIS — F1120 Opioid dependence, uncomplicated: Secondary | ICD-10-CM | POA: Diagnosis not present

## 2020-03-04 DIAGNOSIS — I130 Hypertensive heart and chronic kidney disease with heart failure and stage 1 through stage 4 chronic kidney disease, or unspecified chronic kidney disease: Secondary | ICD-10-CM | POA: Diagnosis not present

## 2020-03-04 DIAGNOSIS — I77810 Thoracic aortic ectasia: Secondary | ICD-10-CM | POA: Diagnosis not present

## 2020-03-04 DIAGNOSIS — I5032 Chronic diastolic (congestive) heart failure: Secondary | ICD-10-CM | POA: Diagnosis not present

## 2020-03-04 DIAGNOSIS — Z0000 Encounter for general adult medical examination without abnormal findings: Secondary | ICD-10-CM | POA: Diagnosis not present

## 2020-03-04 DIAGNOSIS — J45909 Unspecified asthma, uncomplicated: Secondary | ICD-10-CM | POA: Diagnosis not present

## 2020-03-05 DIAGNOSIS — I130 Hypertensive heart and chronic kidney disease with heart failure and stage 1 through stage 4 chronic kidney disease, or unspecified chronic kidney disease: Secondary | ICD-10-CM | POA: Diagnosis not present

## 2020-03-05 DIAGNOSIS — M791 Myalgia, unspecified site: Secondary | ICD-10-CM | POA: Diagnosis not present

## 2020-03-06 DIAGNOSIS — J4520 Mild intermittent asthma, uncomplicated: Secondary | ICD-10-CM | POA: Diagnosis not present

## 2020-04-05 DIAGNOSIS — J4520 Mild intermittent asthma, uncomplicated: Secondary | ICD-10-CM | POA: Diagnosis not present

## 2020-04-12 ENCOUNTER — Ambulatory Visit: Payer: Medicare Other | Admitting: Cardiology

## 2020-04-26 ENCOUNTER — Emergency Department (HOSPITAL_COMMUNITY): Payer: Medicare HMO

## 2020-04-26 ENCOUNTER — Encounter (HOSPITAL_COMMUNITY): Payer: Self-pay | Admitting: Emergency Medicine

## 2020-04-26 ENCOUNTER — Emergency Department (HOSPITAL_COMMUNITY)
Admission: EM | Admit: 2020-04-26 | Discharge: 2020-04-27 | Disposition: A | Discharge status: Home / Self Care | Payer: Medicare HMO | Attending: Emergency Medicine | Admitting: Emergency Medicine

## 2020-04-26 ENCOUNTER — Other Ambulatory Visit: Payer: Self-pay

## 2020-04-26 DIAGNOSIS — R079 Chest pain, unspecified: Secondary | ICD-10-CM | POA: Diagnosis not present

## 2020-04-26 DIAGNOSIS — R0602 Shortness of breath: Secondary | ICD-10-CM | POA: Diagnosis not present

## 2020-04-26 DIAGNOSIS — Z5321 Procedure and treatment not carried out due to patient leaving prior to being seen by health care provider: Secondary | ICD-10-CM | POA: Diagnosis not present

## 2020-04-26 DIAGNOSIS — J45909 Unspecified asthma, uncomplicated: Secondary | ICD-10-CM | POA: Insufficient documentation

## 2020-04-26 LAB — TROPONIN I (HIGH SENSITIVITY)
Troponin I (High Sensitivity): 3 ng/L (ref <18)
Troponin I (High Sensitivity): 3 ng/L (ref <18)

## 2020-04-26 LAB — CBC
HCT: 39.4 % (ref 39.0–52.0)
Hemoglobin: 12.7 g/dL — ABNORMAL LOW (ref 13.0–17.0)
MCH: 28.5 pg (ref 26.0–34.0)
MCHC: 32.2 g/dL (ref 30.0–36.0)
MCV: 88.5 fL (ref 80.0–100.0)
Platelets: 220 10*3/uL (ref 150–400)
RBC: 4.45 MIL/uL (ref 4.22–5.81)
RDW: 13.9 % (ref 11.5–15.5)
WBC: 4.3 10*3/uL (ref 4.0–10.5)
nRBC: 0 % (ref 0.0–0.2)

## 2020-04-26 LAB — BASIC METABOLIC PANEL
Anion gap: 9 (ref 5–15)
BUN: 13 mg/dL (ref 8–23)
CO2: 25 mmol/L (ref 22–32)
Calcium: 9.4 mg/dL (ref 8.9–10.3)
Chloride: 102 mmol/L (ref 98–111)
Creatinine, Ser: 0.86 mg/dL (ref 0.61–1.24)
GFR calc Af Amer: >60 mL/min (ref >60)
GFR calc non Af Amer: >60 mL/min (ref >60)
Glucose, Bld: 91 mg/dL (ref 70–99)
Potassium: 4.2 mmol/L (ref 3.5–5.1)
Sodium: 136 mmol/L (ref 135–145)

## 2020-04-26 LAB — BRAIN NATRIURETIC PEPTIDE: B Natriuretic Peptide: 15.9 pg/mL (ref 0.0–100.0)

## 2020-04-26 MED ORDER — SODIUM CHLORIDE 0.9% FLUSH: 3.0000 mL | Freq: Once | INTRAVENOUS | Status: DC

## 2020-04-26 NOTE — ED Notes (Signed)
Patient refused vitals and demanded to speak to management about wait time. Explained the process of wait time versus acuity and patients bed but patient refused vitals

## 2020-04-26 NOTE — ED Triage Notes (Addendum)
Pt in w/sob, worsened since last night. Hx of asthma, took multiples neb trx's last night w/no relief. No fevers, no Covid exposures (fully vaccinated), sats 98% on RA. Pt does report squeezing pain in central chest since 0100

## 2020-04-26 NOTE — ED Notes (Signed)
Pt taken back to Triage. Informed [place] - RN of pt's BP. Pt also stated that he did not take BP meds today.

## 2020-04-27 ENCOUNTER — Other Ambulatory Visit: Payer: Self-pay | Admitting: Cardiology

## 2020-05-06 DIAGNOSIS — J4520 Mild intermittent asthma, uncomplicated: Secondary | ICD-10-CM | POA: Diagnosis not present

## 2020-05-27 NOTE — Telephone Encounter (Signed)
From pt

## 2020-06-03 ENCOUNTER — Emergency Department (HOSPITAL_COMMUNITY): Payer: Medicare HMO

## 2020-06-03 ENCOUNTER — Emergency Department (HOSPITAL_COMMUNITY)
Admission: EM | Admit: 2020-06-03 | Discharge: 2020-06-03 | Disposition: A | Discharge status: Home / Self Care | Payer: Medicare HMO | Attending: Emergency Medicine | Admitting: Emergency Medicine

## 2020-06-03 DIAGNOSIS — R0902 Hypoxemia: Secondary | ICD-10-CM | POA: Diagnosis not present

## 2020-06-03 DIAGNOSIS — Z87891 Personal history of nicotine dependence: Secondary | ICD-10-CM | POA: Diagnosis not present

## 2020-06-03 DIAGNOSIS — R0603 Acute respiratory distress: Secondary | ICD-10-CM | POA: Diagnosis not present

## 2020-06-03 DIAGNOSIS — J45901 Unspecified asthma with (acute) exacerbation: Secondary | ICD-10-CM | POA: Diagnosis not present

## 2020-06-03 DIAGNOSIS — Z7982 Long term (current) use of aspirin: Secondary | ICD-10-CM | POA: Diagnosis not present

## 2020-06-03 DIAGNOSIS — E039 Hypothyroidism, unspecified: Secondary | ICD-10-CM | POA: Insufficient documentation

## 2020-06-03 DIAGNOSIS — I739 Peripheral vascular disease, unspecified: Secondary | ICD-10-CM | POA: Diagnosis not present

## 2020-06-03 DIAGNOSIS — Z20822 Contact with and (suspected) exposure to covid-19: Secondary | ICD-10-CM | POA: Insufficient documentation

## 2020-06-03 DIAGNOSIS — R0600 Dyspnea, unspecified: Secondary | ICD-10-CM | POA: Diagnosis not present

## 2020-06-03 DIAGNOSIS — R600 Localized edema: Secondary | ICD-10-CM | POA: Diagnosis not present

## 2020-06-03 DIAGNOSIS — R0689 Other abnormalities of breathing: Secondary | ICD-10-CM | POA: Diagnosis not present

## 2020-06-03 DIAGNOSIS — Z79899 Other long term (current) drug therapy: Secondary | ICD-10-CM | POA: Diagnosis not present

## 2020-06-03 DIAGNOSIS — I10 Essential (primary) hypertension: Secondary | ICD-10-CM | POA: Insufficient documentation

## 2020-06-03 DIAGNOSIS — Z79890 Hormone replacement therapy (postmenopausal): Secondary | ICD-10-CM | POA: Diagnosis not present

## 2020-06-03 DIAGNOSIS — R0602 Shortness of breath: Secondary | ICD-10-CM | POA: Diagnosis not present

## 2020-06-03 DIAGNOSIS — I5022 Chronic systolic (congestive) heart failure: Secondary | ICD-10-CM | POA: Diagnosis not present

## 2020-06-03 DIAGNOSIS — G4733 Obstructive sleep apnea (adult) (pediatric): Secondary | ICD-10-CM | POA: Diagnosis not present

## 2020-06-03 DIAGNOSIS — G894 Chronic pain syndrome: Secondary | ICD-10-CM | POA: Diagnosis not present

## 2020-06-03 DIAGNOSIS — I509 Heart failure, unspecified: Secondary | ICD-10-CM | POA: Insufficient documentation

## 2020-06-03 DIAGNOSIS — I5032 Chronic diastolic (congestive) heart failure: Secondary | ICD-10-CM | POA: Diagnosis not present

## 2020-06-03 DIAGNOSIS — J45909 Unspecified asthma, uncomplicated: Secondary | ICD-10-CM | POA: Diagnosis not present

## 2020-06-03 DIAGNOSIS — J449 Chronic obstructive pulmonary disease, unspecified: Secondary | ICD-10-CM | POA: Diagnosis not present

## 2020-06-03 DIAGNOSIS — R069 Unspecified abnormalities of breathing: Secondary | ICD-10-CM | POA: Diagnosis not present

## 2020-06-03 DIAGNOSIS — R000 Tachycardia, unspecified: Secondary | ICD-10-CM | POA: Diagnosis not present

## 2020-06-03 DIAGNOSIS — J90 Pleural effusion, not elsewhere classified: Secondary | ICD-10-CM | POA: Diagnosis not present

## 2020-06-03 LAB — CBC WITH DIFFERENTIAL/PLATELET
Abs Immature Granulocytes: 0.02 10*3/uL (ref 0.00–0.07)
Basophils Absolute: 0 10*3/uL (ref 0.0–0.1)
Basophils Relative: 0 %
Eosinophils Absolute: 0 10*3/uL (ref 0.0–0.5)
Eosinophils Relative: 0 %
HCT: 40.6 % (ref 39.0–52.0)
Hemoglobin: 13.4 g/dL (ref 13.0–17.0)
Immature Granulocytes: 0 %
Lymphocytes Relative: 6 %
Lymphs Abs: 0.4 10*3/uL — ABNORMAL LOW (ref 0.7–4.0)
MCH: 29.2 pg (ref 26.0–34.0)
MCHC: 33 g/dL (ref 30.0–36.0)
MCV: 88.5 fL (ref 80.0–100.0)
Monocytes Absolute: 0.2 10*3/uL (ref 0.1–1.0)
Monocytes Relative: 3 %
Neutro Abs: 5.9 10*3/uL (ref 1.7–7.7)
Neutrophils Relative %: 91 %
Platelets: 202 10*3/uL (ref 150–400)
RBC: 4.59 MIL/uL (ref 4.22–5.81)
RDW: 13.7 % (ref 11.5–15.5)
WBC: 6.5 10*3/uL (ref 4.0–10.5)
nRBC: 0 % (ref 0.0–0.2)

## 2020-06-03 LAB — BASIC METABOLIC PANEL
Anion gap: 12 (ref 5–15)
BUN: 11 mg/dL (ref 8–23)
CO2: 25 mmol/L (ref 22–32)
Calcium: 9.3 mg/dL (ref 8.9–10.3)
Chloride: 99 mmol/L (ref 98–111)
Creatinine, Ser: 0.83 mg/dL (ref 0.61–1.24)
GFR calc Af Amer: >60 mL/min (ref >60)
GFR calc non Af Amer: >60 mL/min (ref >60)
Glucose, Bld: 111 mg/dL — ABNORMAL HIGH (ref 70–99)
Potassium: 3.6 mmol/L (ref 3.5–5.1)
Sodium: 136 mmol/L (ref 135–145)

## 2020-06-03 LAB — SARS CORONAVIRUS 2 BY RT PCR (HOSPITAL ORDER, PERFORMED IN ~~LOC~~ HOSPITAL LAB): SARS Coronavirus 2: NEGATIVE

## 2020-06-03 MED ORDER — ALBUTEROL (5 MG/ML) CONTINUOUS INHALATION SOLN
10.0000 mg/h | INHALATION_SOLUTION | Freq: Once | RESPIRATORY_TRACT | Status: AC
  Administered 2020-06-03: 10 mg/h via RESPIRATORY_TRACT
  Filled 2020-06-03: qty 40

## 2020-06-03 MED ORDER — PREDNISONE 10 MG (21) PO TBPK
ORAL_TABLET | Freq: Every day | ORAL | 0 refills | Status: DC
Start: 2020-06-03 — End: 2020-06-30

## 2020-06-03 MED ORDER — ALBUTEROL SULFATE HFA 108 (90 BASE) MCG/ACT IN AERS
6.0000 | INHALATION_SPRAY | Freq: Once | RESPIRATORY_TRACT | Status: AC
  Administered 2020-06-03: 6 via RESPIRATORY_TRACT
  Filled 2020-06-03: qty 6.7

## 2020-06-03 MED ORDER — AEROCHAMBER PLUS FLO-VU LARGE MISC
Status: AC
  Filled 2020-06-03: qty 1

## 2020-06-03 MED ORDER — IPRATROPIUM BROMIDE 0.02 % IN SOLN
0.5000 mg | Freq: Once | RESPIRATORY_TRACT | Status: AC
  Administered 2020-06-03: 0.5 mg via RESPIRATORY_TRACT
  Filled 2020-06-03: qty 2.5

## 2020-06-03 MED ORDER — PREDNISONE 20 MG PO TABS
40.0000 mg | ORAL_TABLET | Freq: Every day | ORAL | 0 refills | Status: DC
Start: 2020-06-03 — End: 2020-06-03

## 2020-06-03 NOTE — Discharge Instructions (Addendum)
As discussed, it is important that you monitor your condition carefully and do not hesitate to return here.  Otherwise, please be sure to follow-up with your physician. For the next 2 days please use your nebulizer every 4 hours.  You may then use it as needed.  In addition to your albuterol, please use your prescription of steroids as directed.

## 2020-06-03 NOTE — ED Provider Notes (Signed)
[facility] EMERGENCY DEPARTMENT Provider Note   CSN: [id number] Arrival date & time: 06/03/20  1253     History Chief Complaint  Patient presents with  . Respiratory Distress    Peter [name] is a 69 y.o. male.  HPI   89yM with dyspnea. Hx of asthma. Feels like he is having an exacerbation. Onset yesterday. Progressing since. Got to the point that he was utilizing his nebulizer continuously. Chest feels tight, but not painful per say. Some LE edema that is chronic/stable. No orthopnea. No fever or chills.   Past Medical History:  Diagnosis Date  . Abnormal CK 03/11/2013  . Acute blood loss anemia 04/11/2016  . Acute combined systolic and diastolic heart failure ([place]) 03/11/2013  . Allergy   . Arthritis    right knee  . Asthma   . Back pain   . Chronic kidney disease   . Colon polyps 2003, 2015   2003: hyperplastic. 05/2014: tubular adenoma  . Complication of anesthesia    problems urinating after anesthesia  . Diverticulosis of colon 2003, 2015  . Hypertension   . Hypothyroidism    had Graves' disease - thyroid ablation  . MRSA (methicillin resistant Staphylococcus aureus) infection    lumbar-2015  . Obstructive sleep apnea syndrome 09/22/2014   uses cpap  . Opiate use 10/02/2017  . S/P radioactive iodine thyroid ablation 06/04/2017  . Substance abuse ([place])    not in many years  . Wears glasses   . Wears partial dentures    top   Patient Active Problem List   Diagnosis Date Noted  . Congestive heart failure, NYHA class 3, chronic, combined ([place]) 10/02/2017  . Severe persistent asthma 10/02/2017  . Central sleep apnea due to medical condition 10/02/2017  . S/P radioactive iodine thyroid ablation- for Graves disease 06/04/2017  . Asthma exacerbation 06/03/2017  . Lower GI bleed 04/11/2016  . Acute GI bleeding 04/11/2016  . HNP (herniated nucleus pulposus), cervical 04/16/2015  . Obstructive sleep apnea syndrome 09/22/2014  . Complex sleep  apnea syndrome 09/22/2014  . Hypersomnia due to drug ([place])   . Hypothyroid 11/12/2013  . Normocytic anemia 11/12/2013  . Spondylolisthesis of lumbar region 10/22/2013  . Dizziness and giddiness 08/13/2013    Class: Acute  . Acute renal insufficiency 08/13/2013    Class: Acute  . Spondylosis, cervical, with myelopathy 08/01/2013  . Weakness 06/03/2013  . Volume overload 03/11/2013  . HTN (hypertension) 03/11/2013  . DOE (dyspnea on exertion) 03/11/2013  . Hypothyroidism 03/11/2013   Past Surgical History:  Procedure Laterality Date  . ANTERIOR CERVICAL DECOMP/DISCECTOMY FUSION N/A 07/30/2013   Procedure: CERVICAL FIVE,CERVICAL SIX CORPECTOMY,ANTERIOR ARTHRODESIS,PEEK INTERBODY GRAFT CERVICAL FOUR-CERVICAL SEVEN,ANTERIOR INSTRUMENTATION;  Surgeon: Winfield Cunas, MD;  Location: [facility];  Service: Neurosurgery;  Laterality: N/A;  . ANTERIOR CERVICAL DECOMP/DISCECTOMY FUSION N/A 04/16/2015   Procedure: CERVICAL THREE-FOUR ANTERIOR CERVICAL DECOMPRESSION/DISCECTOMY FUSION 1 LEVEL;  Surgeon: Ashok Pall, MD;  Location: [facility];  Service: Neurosurgery;  Laterality: N/A;  C34 anterior cervical decompression with fusion plating and bonegraft  . BACK SURGERY  1/15   lumb fusion  . CARDIAC CATHETERIZATION  2014  . CARPAL TUNNEL RELEASE Right 03/23/2014   Procedure: RIGHT CARPAL TUNNEL RELEASE;  Surgeon: Tennis Must, MD;  Location: [facility];  Service: Orthopedics;  Laterality: Right;  . COLONOSCOPY  2003, 2015  . LEFT AND RIGHT HEART CATHETERIZATION WITH CORONARY ANGIOGRAM N/A 03/11/2013   Procedure: LEFT AND RIGHT HEART CATHETERIZATION WITH  CORONARY ANGIOGRAM;  Surgeon: Laverda Page, MD;  Location: [facility] CATH LAB;  Service: Cardiovascular;  Laterality: N/A;  . LUMBAR WOUND DEBRIDEMENT N/A 11/07/2013   Procedure: LUMBAR WOUND DEBRIDEMENT;  Surgeon: Winfield Cunas, MD;  Location: [facility]S;  Service: Neurosurgery;  Laterality: N/A;  . TONSILLECTOMY  1958     Family  History  Problem Relation Age of Onset  . Diabetes Mother   . Hypertension Mother   . Pancreatic cancer Mother   . Colon cancer Neg Hx    Social History   Tobacco Use  . Smoking status: Former Smoker    Packs/day: 0.50    Years: 0.00    Pack years: 0.00    Quit date: 08/14/1995    Years since quitting: 24.8  . Smokeless tobacco: Never Used  Substance Use Topics  . Alcohol use: No    Alcohol/week: 0.0 standard drinks  . Drug use: No   Home Medications Prior to Admission medications   Medication Sig Start Date End Date Taking? Authorizing Provider  acetaminophen (TYLENOL) 325 MG tablet Take 325 mg by mouth every 6 (six) hours as needed for mild pain (To take 4 a day on top of Percocet).   Yes [provider]  Ascorbic Acid (VITAMIN C) 500 MG CAPS Take 1 tablet by mouth in the morning and at bedtime.   Yes [provider]  aspirin EC 81 MG tablet Take 81 mg by mouth daily.   Yes [provider]  BIDIL 20-37.5 MG tablet TAKE 1 TABLET BY MOUTH THREE TIMES DAILY 04/28/20  Yes Adrian Prows, MD  Cyanocobalamin (VITAMIN B-12) 3000 MCG SUBL Place 1 tablet under the tongue daily.    Yes [provider]  docusate sodium (COLACE) 100 MG capsule Take 400 mg by mouth daily.    Yes [provider]  Folic Acid-Vit P1-WCH E52 (B COMPLEX-FOLIC ACID) 778-2-423 MCG-MG-MCG TABS Take 1 tablet by mouth daily.   Yes [provider]  furosemide (LASIX) 20 MG tablet Take 1 tablet (20 mg total) by mouth daily. Patient taking differently: Take 20 mg by mouth daily as needed for fluid or edema.  08/25/13  Yes Shon Baton, MD  ipratropium-albuterol (DUONEB) 0.5-2.5 (3) MG/3ML SOLN Take 3 mLs by nebulization every 4 (four) hours. Patient taking differently: Take 3 mLs by nebulization every 4 (four) hours as needed (Shortness of breath).  06/05/17  Yes Debbe Odea, MD  levothyroxine (SYNTHROID) 150 MCG tablet Take 150 mcg by mouth daily before breakfast.    Yes  [provider]  metoprolol succinate (TOPROL-XL) 100 MG 24 hr tablet Take 100 mg by mouth daily.  02/06/18  Yes [provider]  montelukast (SINGULAIR) 10 MG tablet Take 10 mg by mouth at bedtime. 05/07/17  Yes [provider]  Omega-3 Fatty Acids (FISH OIL) 1000 MG CAPS Take 2,000 mg by mouth daily.    Yes [provider]  oxyCODONE (ROXICODONE) 5 MG immediate release tablet Take 1 tablet (5 mg total) by mouth every 4 (four) hours as needed for severe pain. 04/18/15  Yes Kritzer, Louie Casa, MD  VENTOLIN HFA 108 [phone] Base) MCG/ACT inhaler Inhale 2 puffs into the lungs every 4 (four) hours as needed. 05/09/17  Yes [provider]  cetirizine (ZYRTEC) 10 MG tablet Take 10 mg by mouth daily as needed.  Patient not taking: Reported on 06/03/2020    [provider]  MOVANTIK 25 MG TABS tablet Take 1 tablet by mouth daily at 2  PM. Patient not taking: Reported on 06/03/2020 05/22/19   [provider]  rosuvastatin (CRESTOR) 5 MG tablet Take 1 tablet (5 mg total) by mouth at bedtime. Patient not taking: Reported on 06/03/2020 09/12/19   Adrian Prows, MD    Allergies    Keflex [cephalexin]  Review of Systems   Review of Systems All systems reviewed and negative, other than as noted in HPI.  Physical Exam Updated Vital Signs Ht 6\' 2"  (1.88 m)   Wt 78.9 kg   BMI 22.34 kg/m   Physical Exam Vitals and nursing note reviewed.  Constitutional:      General: He is not in acute distress.    Appearance: He is well-developed.  HENT:     Head: Normocephalic and atraumatic.  Eyes:     General:        Right eye: No discharge.        Left eye: No discharge.     Conjunctiva/sclera: Conjunctivae normal.  Cardiovascular:     Rate and Rhythm: Normal rate and regular rhythm.     Heart sounds: Normal heart sounds. No murmur heard.  No friction rub. No gallop.   Pulmonary:     Effort: Pulmonary effort is normal. No respiratory distress.     Breath sounds:  Wheezing present.  Abdominal:     General: There is no distension.     Palpations: Abdomen is soft.     Tenderness: There is no abdominal tenderness.  Musculoskeletal:        General: No tenderness.     Cervical back: Neck supple.     Right lower leg: Edema present.     Left lower leg: Edema present.  Skin:    General: Skin is warm and dry.  Neurological:     Mental Status: He is alert.  Psychiatric:        Behavior: Behavior normal.        Thought Content: Thought content normal.    ED Results / Procedures / Treatments   Labs (all labs ordered are listed, but only abnormal results are displayed) Labs Reviewed  CBC WITH DIFFERENTIAL/PLATELET - Abnormal; Notable for the following components:      Result Value   Lymphs Abs 0.4 (*)    All other components within normal limits  BASIC METABOLIC PANEL - Abnormal; Notable for the following components:   Glucose, Bld 111 (*)    All other components within normal limits  SARS CORONAVIRUS 2 BY RT PCR (HOSPITAL ORDER, [facility])   EKG None  Radiology DG Chest Portable 1 View  Result Date: 06/03/2020 CLINICAL DATA:  Shortness of breath, history of asthma EXAM: PORTABLE CHEST 1 VIEW COMPARISON:  04/26/2020 FINDINGS: Heart size is within normal limits. Aorta is calcified and tortuous. Prominent skin fold overlies the peripheral aspect of the lower left hemithorax. Lungs are hyperexpanded with chronically coarsened interstitial markings. No focal airspace consolidation, pleural effusion, or pneumothorax. Lower cervical ACDF. IMPRESSION: COPD. No acute cardiopulmonary findings. Electronically Signed   By: Davina Poke D.O.   On: 06/03/2020 13:32   Procedures Procedures (including critical care time)  Medications Ordered in ED Medications  albuterol (VENTOLIN HFA) 108 (90 Base) MCG/ACT inhaler 6 puff (6 puffs Inhalation Given 06/03/20 1336)  albuterol (PROVENTIL,VENTOLIN) solution continuous neb (10 mg/hr  Nebulization Given 06/03/20 1606)  ipratropium (ATROVENT) nebulizer solution 0.5 mg (0.5 mg Nebulization Given 06/03/20 1606)    ED Course  I have reviewed the triage vital signs  and the nursing notes.  Pertinent labs & imaging results that were available during my care of the patient were reviewed by me and considered in my medical decision making (see chart for details).    MDM Rules/Calculators/A&P                          69yM with dyspnea. Significant wheezing on exam. CXR with hyperexpansion. No focal infiltrate. Some LE edema, but he reports this is stable. Clinically not HF. Plan nebs and reassessment. If clinically improved then DC with continued steroids and close outpt FU. Care transitioned to Dr Vanita Panda at [place].   Final Clinical Impression(s) / ED Diagnoses Final diagnoses:  Exacerbation of asthma, unspecified asthma severity, unspecified whether persistent    Rx / DC Orders ED Discharge Orders    None       Virgel Manifold, MD 06/06/20 1354

## 2020-06-03 NOTE — ED Provider Notes (Signed)
I assumed care of the patient at signout. On my initial exam the patient had mild persistent wheezing, tachypnea.  Patient's Covid test was negative, and he was awaiting continuous nebulizer.  5:57 PM Now, following completion of his continuous nebulizers, the patient is on room air, 97% saturation, states that he feels better, has no substantial increased work of breathing, has negligible, though noted persistent wheezing. Patient appropriate for discharge with close outpatient follow-up ongoing steroids, albuterol.   Carmin Muskrat, MD 06/03/20 1758

## 2020-06-03 NOTE — ED Triage Notes (Signed)
TO ED via [facility] from [facility], with resp distress - received albuterol enroute and solumedrol 125 mg IV--  IV 18 G left AC- working hard to breath, Dr notified. Is unable to speak in complete sentences- hx of ASTHMA Vaccinated x 3

## 2020-06-06 DIAGNOSIS — J4520 Mild intermittent asthma, uncomplicated: Secondary | ICD-10-CM | POA: Diagnosis not present

## 2020-06-21 ENCOUNTER — Ambulatory Visit: Payer: Medicare HMO

## 2020-06-21 ENCOUNTER — Other Ambulatory Visit: Payer: Self-pay

## 2020-06-21 DIAGNOSIS — I5032 Chronic diastolic (congestive) heart failure: Secondary | ICD-10-CM

## 2020-06-21 DIAGNOSIS — I77810 Thoracic aortic ectasia: Secondary | ICD-10-CM | POA: Diagnosis not present

## 2020-06-30 ENCOUNTER — Other Ambulatory Visit: Payer: Self-pay

## 2020-06-30 ENCOUNTER — Encounter: Payer: Self-pay | Admitting: Cardiology

## 2020-06-30 ENCOUNTER — Ambulatory Visit: Payer: Medicare HMO | Admitting: Cardiology

## 2020-06-30 VITALS — BP 115/76 | HR 83 | Resp 16 | Ht 74.0 in | Wt 194.0 lb

## 2020-06-30 DIAGNOSIS — R0609 Other forms of dyspnea: Secondary | ICD-10-CM | POA: Diagnosis not present

## 2020-06-30 DIAGNOSIS — I10 Essential (primary) hypertension: Secondary | ICD-10-CM

## 2020-06-30 DIAGNOSIS — I77810 Thoracic aortic ectasia: Secondary | ICD-10-CM

## 2020-06-30 DIAGNOSIS — I5032 Chronic diastolic (congestive) heart failure: Secondary | ICD-10-CM | POA: Diagnosis not present

## 2020-06-30 DIAGNOSIS — R600 Localized edema: Secondary | ICD-10-CM

## 2020-06-30 NOTE — Progress Notes (Signed)
Primary Physician/Referring:  Shon Baton, MD  Patient ID: Peter [name], male    DOB: [birth year], 69 y.o.   MRN: [id number]  Chief Complaint  Patient presents with  . Follow-up    1 year  . Chronic diastolic heart failure  . aortic root dilation   HPI:    HPI: Peter [name]  is a 69 y.o. male  African-American male with a history of nonischemic cardiomyopathy with severe LV systolic dysfunction, cardiac catheterization 03/11/2013 had revealed no significant coronary artery disease, ejection fraction 25%. With aggressive medical therapies ejection fraction has normalized by last echocardiogram in November 2018.   He has history of chronic bilateral leg edema, hypertension, very mild hyperlipidemia, statins were discontinued as he had had markedly elevated CK enzymes and rhabdomyolysis, etiology was not known in 2014. He also has radioactive iodine therapy for hyperthyroidismin 2014, obstructive sleep apnea on CPAP and follows Dr. Roddie Mc, chronic back pain due to DJD.   Patient was last evaluated in the emergency room on 04/26/2020 when he presented with exacerbation of bronchial asthma, BNP was normal and heart failure was not suspected.  He now presents for his annual visit.  He had developed headaches with BiDil, hence reduce the dose but has started to take the medication again.  Past Medical History:  Diagnosis Date  . Abnormal CK 03/11/2013  . Acute blood loss anemia 04/11/2016  . Acute combined systolic and diastolic heart failure ([place]) 03/11/2013  . Allergy   . Arthritis    right knee  . Asthma   . Back pain   . Chronic kidney disease   . Colon polyps 2003, 2015   2003: hyperplastic. 05/2014: tubular adenoma  . Complication of anesthesia    problems urinating after anesthesia  . Diverticulosis of colon 2003, 2015  . Hypertension   . Hypothyroidism    had Graves' disease - thyroid ablation  . MRSA (methicillin resistant Staphylococcus aureus) infection     lumbar-2015  . Obstructive sleep apnea syndrome 09/22/2014   uses cpap  . Opiate use 10/02/2017  . S/P radioactive iodine thyroid ablation 06/04/2017  . Substance abuse ([place])    not in many years  . Wears glasses   . Wears partial dentures    top   Past Surgical History:  Procedure Laterality Date  . ANTERIOR CERVICAL DECOMP/DISCECTOMY FUSION N/A 07/30/2013   Procedure: CERVICAL FIVE,CERVICAL SIX CORPECTOMY,ANTERIOR ARTHRODESIS,PEEK INTERBODY GRAFT CERVICAL FOUR-CERVICAL SEVEN,ANTERIOR INSTRUMENTATION;  Surgeon: Winfield Cunas, MD;  Location: [place] [facility]S;  Service: Neurosurgery;  Laterality: N/A;  . ANTERIOR CERVICAL DECOMP/DISCECTOMY FUSION N/A 04/16/2015   Procedure: CERVICAL THREE-FOUR ANTERIOR CERVICAL DECOMPRESSION/DISCECTOMY FUSION 1 LEVEL;  Surgeon: Ashok Pall, MD;  Location: [place] [facility];  Service: Neurosurgery;  Laterality: N/A;  C34 anterior cervical decompression with fusion plating and bonegraft  . BACK SURGERY  1/15   lumb fusion  . CARDIAC CATHETERIZATION  2014  . CARPAL TUNNEL RELEASE Right 03/23/2014   Procedure: RIGHT CARPAL TUNNEL RELEASE;  Surgeon: Tennis Must, MD;  Location: [place];  Service: Orthopedics;  Laterality: Right;  . COLONOSCOPY  2003, 2015  . LEFT AND RIGHT HEART CATHETERIZATION WITH CORONARY ANGIOGRAM N/A 03/11/2013   Procedure: LEFT AND RIGHT HEART CATHETERIZATION WITH CORONARY ANGIOGRAM;  Surgeon: Laverda Page, MD;  Location: [facility] CATH LAB;  Service: Cardiovascular;  Laterality: N/A;  . LUMBAR WOUND DEBRIDEMENT N/A 11/07/2013   Procedure: LUMBAR WOUND DEBRIDEMENT;  Surgeon: Winfield Cunas, MD;  Location: [facility];  Service:  Neurosurgery;  Laterality: N/A;  . TONSILLECTOMY  1958   Social History   Tobacco Use  . Smoking status: Former Smoker    Packs/day: 0.50    Years: 0.00    Pack years: 0.00    Quit date: 08/14/1995    Years since quitting: 24.8  . Smokeless tobacco: Never Used  Substance Use Topics  . Alcohol use: No     Alcohol/week: 0.0 standard drinks  Marital Status: Married    ROS  Review of Systems  Cardiovascular: Positive for dyspnea on exertion and leg swelling. Negative for chest pain.  Respiratory: Positive for wheezing.   Musculoskeletal: Positive for arthritis, back pain and joint pain.  Gastrointestinal: Negative for melena.   Objective  Blood pressure 115/76, pulse 83, resp. rate 16, height _0  (1.88 m), weight 194 lb (88 kg), SpO2 96 %. Body mass index is 24.91 kg/m.  Vitals with BMI 06/30/2020 06/03/2020 06/03/2020  Height _1  - -  Weight 194 lbs - -  BMI 82.4 - -  Systolic 235 361 443  Diastolic 76 154 008  Pulse 83 - -      Physical Exam Constitutional:      General: He is not in acute distress.    Appearance: He is well-developed.  HENT:     Head: Atraumatic.  Eyes:     Conjunctiva/sclera: Conjunctivae normal.  Neck:     Thyroid: No thyromegaly.     Vascular: No JVD.  Cardiovascular:     Rate and Rhythm: Normal rate and regular rhythm.     Pulses:          Carotid pulses are 2+ on the right side and 2+ on the left side.      Femoral pulses are 2+ on the right side and 2+ on the left side.      Dorsalis pedis pulses are 2+ on the right side and 1+ on the left side.       Posterior tibial pulses are 2+ on the right side and 1+ on the left side.     Heart sounds: Normal heart sounds. No murmur heard.  No gallop.      Comments: Bilateral 2+ pitting edema present. No JVD Pulmonary:     Effort: Pulmonary effort is normal.     Breath sounds: Normal breath sounds.  Abdominal:     General: Bowel sounds are normal.     Palpations: Abdomen is soft.  Musculoskeletal:        General: Normal range of motion.  Skin:    General: Skin is warm and dry.    Radiology: No results found.  Laboratory examination:   CMP Latest Ref Rng & Units 06/03/2020 04/26/2020 06/04/2017  Glucose 70 - 99 mg/dL 111(H) 91 121(H)  BUN 8 - 23 mg/dL _2 Creatinine 0.61 - 1.24 mg/dL 0.83  0.86 0.92  Sodium 135 - 145 mmol/L 136 136 137  Potassium 3.5 - 5.1 mmol/L 3.6 4.2 4.4  Chloride 98 - 111 mmol/L 99 102 103  CO2 22 - 32 mmol/L _3 Calcium 8.9 - 10.3 mg/dL 9.3 9.4 9.9  Total Protein 6.5 - 8.1 g/dL - - -  Total Bilirubin 0.3 - 1.2 mg/dL - - -  Alkaline Phos 38 - 126 U/L - - -  AST 15 - 41 U/L - - -  ALT 17 - 63 U/L - - -   CBC Latest Ref Rng & Units 06/03/2020 04/26/2020 06/04/2017  WBC  4.0 - 10.5 K/uL 6.5 4.3 3.6(L)  Hemoglobin 13.0 - 17.0 g/dL 13.4 12.7(L) 15.1  Hematocrit 39 - 52 % 40.6 39.4 44.8  Platelets 150 - 400 K/uL 202 220 266   Lipid Panel  No results found for: CHOL, TRIG, HDL, CHOLHDL, VLDL, LDLCALC, LDLDIRECT HEMOGLOBIN A1C No results found for: HGBA1C, MPG TSH No results for input(s): TSH in the last 8760 hours.   External labs:  Cholesterol, total 113.000 m 02/27/2020 HDL 40 MG/DL 02/27/2020 LDL 56.000 mg 02/27/2020 Triglycerides 85.000 02/27/2020  A1C 5.200 % 02/27/2020 TSH 2.240 02/27/2020  Hemoglobin 13.400 g/d 06/03/2020 Platelets 202.000 K/ 06/03/2020  Creatinine, Serum 0.830 mg/ 06/03/2020 Potassium 3.600 mm 06/03/2020 ALT (SGPT) 14.000 uni 02/27/2020  Labs 02/21/2018: Serum glucose 95 mg, BUN 15, creatinine 0.9, EGFR greater than 60 ML, potassium 4.9, CMP otherwise normal. HB 13.9/HCT 43.7, platelets 221.  Cholesterol 158, triglycerides 98, HDL 33, LDL 105. Non-HDL cholesterol 125 Apo B normal at 83, TSH low at 0.09. PSA normal.  Labs 08/08/2017: Serum glucose 136, BUN 25, creatinine 1.4, eGFR 61 mL, potassium 4.0.   Medications   Current Outpatient Medications on File Prior to Visit  Medication Sig Dispense Refill  . acetaminophen (TYLENOL) 325 MG tablet Take 325 mg by mouth every 6 (six) hours as needed for mild pain (To take 4 a day on top of Percocet).    . Ascorbic Acid (VITAMIN C) 500 MG CAPS Take 1 tablet by mouth in the morning and at bedtime.    Marland Kitchen aspirin EC 81 MG tablet Take 81 mg by mouth daily.    Marland Kitchen BIDIL 20-37.5 MG tablet TAKE 1  TABLET BY MOUTH THREE TIMES DAILY (Patient taking differently: 1 tablet daily. ) 270 tablet 1  . cetirizine (ZYRTEC) 10 MG tablet Take 10 mg by mouth daily as needed.     . Cyanocobalamin (VITAMIN B-12) 3000 MCG SUBL Place 1 tablet under the tongue daily.     Marland Kitchen docusate sodium (COLACE) 100 MG capsule Take 400 mg by mouth daily.     . furosemide (LASIX) 20 MG tablet Take 1 tablet (20 mg total) by mouth daily. (Patient taking differently: Take 20 mg by mouth daily as needed for fluid or edema. ) 30 tablet   . ipratropium-albuterol (DUONEB) 0.5-2.5 (3) MG/3ML SOLN Take 3 mLs by nebulization every 4 (four) hours. (Patient taking differently: Take 3 mLs by nebulization every 4 (four) hours as needed (Shortness of breath). ) 360 mL 0  . levothyroxine (SYNTHROID) 150 MCG tablet Take 138 mcg by mouth daily before breakfast.     . metoprolol succinate (TOPROL-XL) 100 MG 24 hr tablet Take 100 mg by mouth daily.     . montelukast (SINGULAIR) 10 MG tablet Take 10 mg by mouth at bedtime.    . Omega-3 Fatty Acids (FISH OIL) 1000 MG CAPS Take 2,000 mg by mouth daily.     Marland Kitchen oxyCODONE (ROXICODONE) 5 MG immediate release tablet Take 1 tablet (5 mg total) by mouth every 4 (four) hours as needed for severe pain. 45 tablet 0  . VENTOLIN HFA 108 (90 Base) MCG/ACT inhaler Inhale 2 puffs into the lungs every 4 (four) hours as needed.     No current facility-administered medications on file prior to visit.     Cardiac Studies:   Lower extremity venous insufficiency study 06/20/2011:  No evidence of venous insufficiency.  No evidence of DVT.  Cardiac catheterization 03/11/2013: Ejection fraction 15%, global hypokinesis, mild luminal irregularity. Normal right heart pressure.Marland Kitchen  EF 07/14/13: 20-25% Improved on Medical therapy.  Echocardiogram 06/21/2020: Left ventricle cavity is normal in size. Mild concentric hypertrophy of the left ventricle. Normal global wall motion. Normal LV systolic function with visual EF 50-55%.  Doppler evidence of grade I (impaired) diastolic dysfunction, normal LAP.  The aortic root is dilated, measuring 4.2 cm at sinotubular junction. Left atrial cavity is mildly dilated. Mild to moderate mitral regurgitation. Mild to moderate tricuspid regurgitation. Estimated pulmonary artery systolic pressure 34 mmHg.  Mild pulmonic regurgitation. Compared to previous study on 06/13/2019, aortic root diameter increased from 3.9 cm to 4.2 cm. RVSP increased from 21 mmHg to 34 mmHg.  EKG:   EKG 06/27/2019: Normal sinus rhythm at rate of 66 bpm, normal axis.  No evidence of ischemia, normal EKG. No significant change from  EKG 04/20/2018.  Assessment     ICD-10-CM   1. Chronic diastolic (congestive) heart failure (HCC)  I50.32 EKG 12-Lead  2. Dyspnea on exertion  R06.00   3. Bilateral leg edema  R60.0   4. Essential hypertension  I10   5. Aortic root dilatation [facility])  I77.810    Recommendations:   Peter [name]  Is a 69 y.o. African-American male with a history of nonischemic cardiomyopathy with severe LV systolic dysfunction, cardiac catheterization 03/11/2013 had revealed no significant coronary artery disease, ejection fraction 25%. With aggressive medical therapies ejection fraction has normalized since 2018. He has history of hypertension, very mild hyperlipidemia, statins were discontinued as he had had markedly elevated CK enzymes and rhabdomyolysis, etiology was not known in 2014. He also has radioactive iodine therapy for hyperthyroidismin 2014, obstructive sleep apnea on CPAP and follows Dr. Roddie Mc, chronic back pain due to DJD. Suspect hyperthyroidism to be the etiology for his rhabdomyolysis.   His leg edema is chronic also it is dependent edema due to chronic back pain and lack of physical activity, but overall stable.  He does not have varicose veins.   Blood pressure is well controlled, there is no clinical evidence of heart failure today.  Do not think we need to change any  of his medication, due to headache he had reduced the dose of BiDil but he is slowly titrating it back up.  His edema is much improved and very minimal. Aortic root dilatation is mild and stable.   I will see him back in a year.     Adrian Prows, MD, [facility] 06/30/2020, 1:00 PM Office: [phone]

## 2020-07-06 DIAGNOSIS — J4520 Mild intermittent asthma, uncomplicated: Secondary | ICD-10-CM | POA: Diagnosis not present

## 2020-07-15 ENCOUNTER — Ambulatory Visit: Payer: Medicare HMO | Admitting: Podiatry

## 2020-07-15 ENCOUNTER — Other Ambulatory Visit: Payer: Self-pay

## 2020-07-15 DIAGNOSIS — M79674 Pain in right toe(s): Secondary | ICD-10-CM

## 2020-07-15 DIAGNOSIS — M79675 Pain in left toe(s): Secondary | ICD-10-CM | POA: Diagnosis not present

## 2020-07-15 DIAGNOSIS — B351 Tinea unguium: Secondary | ICD-10-CM | POA: Diagnosis not present

## 2020-07-17 DIAGNOSIS — Z23 Encounter for immunization: Secondary | ICD-10-CM | POA: Diagnosis not present

## 2020-07-20 NOTE — Progress Notes (Signed)
Subjective:   Patient ID: Peter [name], male   DOB: 69 y.o.   MRN: [id number]   HPI 55 male presents the office for toenail issues.  States the nails were thickened discolored they are curving upwards causing discomfort and he is having difficulty trimming his nail.  He has a history of toenail infections of the nails become too thick.  Currently denies any redness or drainage or any swelling to the toenail sites.  He has no other concerns today.   Review of Systems  All other systems reviewed and are negative.  Past Medical History:  Diagnosis Date  . Abnormal CK 03/11/2013  . Acute blood loss anemia 04/11/2016  . Acute combined systolic and diastolic heart failure ([place]) 03/11/2013  . Allergy   . Arthritis    right knee  . Asthma   . Back pain   . Chronic kidney disease   . Colon polyps 2003, 2015   2003: hyperplastic. 05/2014: tubular adenoma  . Complication of anesthesia    problems urinating after anesthesia  . Diverticulosis of colon 2003, 2015  . Hypertension   . Hypothyroidism    had Graves' disease - thyroid ablation  . MRSA (methicillin resistant Staphylococcus aureus) infection    lumbar-2015  . Obstructive sleep apnea syndrome 09/22/2014   uses cpap  . Opiate use 10/02/2017  . S/P radioactive iodine thyroid ablation 06/04/2017  . Substance abuse ([place])    not in many years  . Wears glasses   . Wears partial dentures    top    Past Surgical History:  Procedure Laterality Date  . ANTERIOR CERVICAL DECOMP/DISCECTOMY FUSION N/A 07/30/2013   Procedure: CERVICAL FIVE,CERVICAL SIX CORPECTOMY,ANTERIOR ARTHRODESIS,PEEK INTERBODY GRAFT CERVICAL FOUR-CERVICAL SEVEN,ANTERIOR INSTRUMENTATION;  Surgeon: Winfield Cunas, MD;  Location: [place] NEURO ORS;  Service: Neurosurgery;  Laterality: N/A;  . ANTERIOR CERVICAL DECOMP/DISCECTOMY FUSION N/A 04/16/2015   Procedure: CERVICAL THREE-FOUR ANTERIOR CERVICAL DECOMPRESSION/DISCECTOMY FUSION 1 LEVEL;  Surgeon: Ashok Pall, MD;  Location:  [place] [facility];  Service: Neurosurgery;  Laterality: N/A;  C34 anterior cervical decompression with fusion plating and bonegraft  . BACK SURGERY  1/15   lumb fusion  . CARDIAC CATHETERIZATION  2014  . CARPAL TUNNEL RELEASE Right 03/23/2014   Procedure: RIGHT CARPAL TUNNEL RELEASE;  Surgeon: Tennis Must, MD;  Location: [place];  Service: Orthopedics;  Laterality: Right;  . COLONOSCOPY  2003, 2015  . LEFT AND RIGHT HEART CATHETERIZATION WITH CORONARY ANGIOGRAM N/A 03/11/2013   Procedure: LEFT AND RIGHT HEART CATHETERIZATION WITH CORONARY ANGIOGRAM;  Surgeon: Laverda Page, MD;  [facility] CATH LAB;  Service: Cardiovascular;  Laterality: N/A;  . LUMBAR WOUND DEBRIDEMENT N/A 11/07/2013   Procedure: LUMBAR WOUND DEBRIDEMENT;  Surgeon: Winfield Cunas, MD;  Location: [place] NEURO ORS;  Service: Neurosurgery;  Laterality: N/A;  . TONSILLECTOMY  1958     Current Outpatient Medications:  .  acetaminophen (TYLENOL) 325 MG tablet, Take 325 mg by mouth every 6 (six) hours as needed for mild pain (To take 4 a day on top of Percocet)., Disp: , Rfl:  .  Ascorbic Acid (VITAMIN C) 500 MG CAPS, Take 1 tablet by mouth in the morning and at bedtime., Disp: , Rfl:  .  aspirin EC 81 MG tablet, Take 81 mg by mouth daily., Disp: , Rfl:  .  BIDIL 20-37.5 MG tablet, TAKE 1 TABLET BY MOUTH THREE TIMES DAILY (Patient taking differently: 1 tablet daily. ), Disp: 270 tablet, Rfl: 1 .  cetirizine (  ZYRTEC) 10 MG tablet, Take 10 mg by mouth daily as needed. , Disp: , Rfl:  .  Cyanocobalamin (VITAMIN B-12) 3000 MCG SUBL, Place 1 tablet under the tongue daily. , Disp: , Rfl:  .  docusate sodium (COLACE) 100 MG capsule, Take 400 mg by mouth daily. , Disp: , Rfl:  .  furosemide (LASIX) 20 MG tablet, Take 1 tablet (20 mg total) by mouth daily. (Patient taking differently: Take 20 mg by mouth daily as needed for fluid or edema. ), Disp: 30 tablet, Rfl:  .  ipratropium-albuterol (DUONEB) 0.5-2.5 (3) MG/3ML SOLN,  Take 3 mLs by nebulization every 4 (four) hours. (Patient taking differently: Take 3 mLs by nebulization every 4 (four) hours as needed (Shortness of breath). ), Disp: 360 mL, Rfl: 0 .  levothyroxine (SYNTHROID) 150 MCG tablet, Take 138 mcg by mouth daily before breakfast. , Disp: , Rfl:  .  metoprolol succinate (TOPROL-XL) 100 MG 24 hr tablet, Take 100 mg by mouth daily. , Disp: , Rfl:  .  montelukast (SINGULAIR) 10 MG tablet, Take 10 mg by mouth at bedtime., Disp: , Rfl:  .  Omega-3 Fatty Acids (FISH OIL) 1000 MG CAPS, Take 2,000 mg by mouth daily. , Disp: , Rfl:  .  oxyCODONE (ROXICODONE) 5 MG immediate release tablet, Take 1 tablet (5 mg total) by mouth every 4 (four) hours as needed for severe pain., Disp: 45 tablet, Rfl: 0 .  predniSONE (DELTASONE) 10 MG tablet, , Disp: , Rfl:  .  VENTOLIN HFA 108 (90 Base) MCG/ACT inhaler, Inhale 2 puffs into the lungs every 4 (four) hours as needed., Disp: , Rfl:   Allergies  Allergen Reactions  . Keflex [Cephalexin] Anaphylaxis         Objective:  Physical Exam  General: AAO x3, NAD  Dermatological: Nails are significantly hypertrophic, dystrophic, brittle, discolored, elongated 10. No surrounding redness or drainage. Tenderness nails 1-5 bilaterally. No open lesions or pre-ulcerative lesions are identified today.  Vascular: Dorsalis Pedis artery and Posterior Tibial artery pedal pulses are 2/4 bilateral with immedate capillary fill time. There is no pain with calf compression, swelling, warmth, erythema.   Neruologic: Grossly intact via light touch bilateral.   Musculoskeletal: No gross boney pedal deformities bilateral. No pain, crepitus, or limitation noted with foot and ankle range of motion bilateral. Muscular strength 5/5 in all groups tested bilateral.  Gait: Unassisted, Nonantalgic.       Assessment:   69 year old male with symptomatic onychomycosis    Plan:  -Treatment options discussed including all alternatives, risks, and  complications -Etiology of symptoms were discussed -Sharply debrided the nails x10 without any complications or bleeding.  Given significant dystrophy of the nails healthy medications to be helpful and he will come in for routine debridement to help prevent pain/infection.  -Daily foot inspection  Return in about 3 months (around 10/15/2020).  Trula Slade DPM

## 2020-07-23 DIAGNOSIS — I5032 Chronic diastolic (congestive) heart failure: Secondary | ICD-10-CM | POA: Diagnosis not present

## 2020-07-23 DIAGNOSIS — I5022 Chronic systolic (congestive) heart failure: Secondary | ICD-10-CM | POA: Diagnosis not present

## 2020-07-23 DIAGNOSIS — I77810 Thoracic aortic ectasia: Secondary | ICD-10-CM | POA: Diagnosis not present

## 2020-07-23 DIAGNOSIS — R972 Elevated prostate specific antigen [PSA]: Secondary | ICD-10-CM | POA: Diagnosis not present

## 2020-07-23 DIAGNOSIS — E039 Hypothyroidism, unspecified: Secondary | ICD-10-CM | POA: Diagnosis not present

## 2020-07-23 DIAGNOSIS — N182 Chronic kidney disease, stage 2 (mild): Secondary | ICD-10-CM | POA: Diagnosis not present

## 2020-07-23 DIAGNOSIS — E785 Hyperlipidemia, unspecified: Secondary | ICD-10-CM | POA: Diagnosis not present

## 2020-07-23 DIAGNOSIS — I130 Hypertensive heart and chronic kidney disease with heart failure and stage 1 through stage 4 chronic kidney disease, or unspecified chronic kidney disease: Secondary | ICD-10-CM | POA: Diagnosis not present

## 2020-07-23 DIAGNOSIS — R739 Hyperglycemia, unspecified: Secondary | ICD-10-CM | POA: Diagnosis not present

## 2020-07-23 DIAGNOSIS — G894 Chronic pain syndrome: Secondary | ICD-10-CM | POA: Diagnosis not present

## 2020-08-06 DIAGNOSIS — J4520 Mild intermittent asthma, uncomplicated: Secondary | ICD-10-CM | POA: Diagnosis not present

## 2020-09-05 DIAGNOSIS — J4520 Mild intermittent asthma, uncomplicated: Secondary | ICD-10-CM | POA: Diagnosis not present

## 2020-10-06 DIAGNOSIS — J4520 Mild intermittent asthma, uncomplicated: Secondary | ICD-10-CM | POA: Diagnosis not present

## 2020-10-25 ENCOUNTER — Ambulatory Visit: Payer: Medicare HMO | Admitting: Podiatry

## 2020-11-01 DIAGNOSIS — I5032 Chronic diastolic (congestive) heart failure: Secondary | ICD-10-CM | POA: Diagnosis not present

## 2020-11-01 DIAGNOSIS — I130 Hypertensive heart and chronic kidney disease with heart failure and stage 1 through stage 4 chronic kidney disease, or unspecified chronic kidney disease: Secondary | ICD-10-CM | POA: Diagnosis not present

## 2020-11-01 DIAGNOSIS — R739 Hyperglycemia, unspecified: Secondary | ICD-10-CM | POA: Diagnosis not present

## 2020-11-01 DIAGNOSIS — I5022 Chronic systolic (congestive) heart failure: Secondary | ICD-10-CM | POA: Diagnosis not present

## 2020-11-01 DIAGNOSIS — R627 Adult failure to thrive: Secondary | ICD-10-CM | POA: Diagnosis not present

## 2020-11-01 DIAGNOSIS — N182 Chronic kidney disease, stage 2 (mild): Secondary | ICD-10-CM | POA: Diagnosis not present

## 2020-11-01 DIAGNOSIS — G894 Chronic pain syndrome: Secondary | ICD-10-CM | POA: Diagnosis not present

## 2020-11-01 DIAGNOSIS — E785 Hyperlipidemia, unspecified: Secondary | ICD-10-CM | POA: Diagnosis not present

## 2020-11-01 DIAGNOSIS — F1120 Opioid dependence, uncomplicated: Secondary | ICD-10-CM | POA: Diagnosis not present

## 2020-11-06 DIAGNOSIS — J4520 Mild intermittent asthma, uncomplicated: Secondary | ICD-10-CM | POA: Diagnosis not present

## 2020-12-04 DIAGNOSIS — J4520 Mild intermittent asthma, uncomplicated: Secondary | ICD-10-CM | POA: Diagnosis not present

## 2020-12-06 ENCOUNTER — Ambulatory Visit (INDEPENDENT_AMBULATORY_CARE_PROVIDER_SITE_OTHER): Payer: Medicare HMO | Admitting: Podiatry

## 2020-12-06 ENCOUNTER — Encounter: Payer: Self-pay | Admitting: Podiatry

## 2020-12-06 ENCOUNTER — Other Ambulatory Visit: Payer: Self-pay

## 2020-12-06 DIAGNOSIS — M79675 Pain in left toe(s): Secondary | ICD-10-CM

## 2020-12-06 DIAGNOSIS — M79674 Pain in right toe(s): Secondary | ICD-10-CM

## 2020-12-06 DIAGNOSIS — B351 Tinea unguium: Secondary | ICD-10-CM

## 2020-12-06 NOTE — Progress Notes (Signed)
°  Subjective:  Patient ID: Peter [name], male    DOB: 1951-05-20,  MRN: [id number]  Peter [name] presents to clinic today for painful thick toenails that are difficult to trim. Pain interferes with ambulation. Aggravating factors include wearing enclosed shoe gear. Pain is relieved with periodic professional debridement.  Allergies  Allergen Reactions   Keflex [Cephalexin] Anaphylaxis    Review of Systems: Negative except as noted in the HPI. Objective:   Constitutional Peter [name] is a pleasant 70 y.o. African American male, in NAD. AAO x 3.   Vascular Capillary refill time to digits immediate b/l. Palpable pedal pulses b/l LE. Pedal hair sparse. Lower extremity skin temperature gradient within normal limits. No cyanosis or clubbing noted.  Neurologic Normal speech. Oriented to person, place, and time. Protective sensation intact 5/5 intact bilaterally with 10g monofilament b/l. Vibratory sensation intact b/l.  Dermatologic Pedal skin with normal turgor, texture and tone bilaterally. No open wounds bilaterally. No interdigital macerations bilaterally. Toenails 1-5 b/l elongated, discolored, dystrophic, thickened, crumbly with subungual debris and tenderness to dorsal palpation.  Orthopedic: Normal muscle strength 5/5 to all lower extremity muscle groups bilaterally. No pain crepitus or joint limitation noted with ROM b/l. No gross bony deformities bilaterally.   Radiographs: None Assessment:   1. Pain due to onychomycosis of toenails of both feet    Plan:  Patient was evaluated and treated and all questions answered.  Onychomycosis with pain -Nails palliatively debridement as below -Educated on self-care  Procedure: Nail Debridement Rationale: Pain Type of Debridement: manual, sharp debridement. Instrumentation: Nail nipper, rotary burr. Number of Nails: 10 -Examined patient. -Patient to continue soft, supportive shoe gear daily. -Toenails 1-5 b/l were debrided  in length and girth with sterile nail nippers and dremel without iatrogenic bleeding.  -Patient to report any pedal injuries to medical professional immediately. -Patient/POA to call should there be question/concern in the interim.  Return in about 3 months (around 03/08/2021).  Marzetta Board, DPM

## 2021-01-04 DIAGNOSIS — J4520 Mild intermittent asthma, uncomplicated: Secondary | ICD-10-CM | POA: Diagnosis not present

## 2021-02-03 DIAGNOSIS — J4520 Mild intermittent asthma, uncomplicated: Secondary | ICD-10-CM | POA: Diagnosis not present

## 2021-03-08 DIAGNOSIS — E538 Deficiency of other specified B group vitamins: Secondary | ICD-10-CM | POA: Diagnosis not present

## 2021-03-08 DIAGNOSIS — E785 Hyperlipidemia, unspecified: Secondary | ICD-10-CM | POA: Diagnosis not present

## 2021-03-08 DIAGNOSIS — Z0000 Encounter for general adult medical examination without abnormal findings: Secondary | ICD-10-CM | POA: Diagnosis not present

## 2021-03-08 DIAGNOSIS — R739 Hyperglycemia, unspecified: Secondary | ICD-10-CM | POA: Diagnosis not present

## 2021-03-08 DIAGNOSIS — E039 Hypothyroidism, unspecified: Secondary | ICD-10-CM | POA: Diagnosis not present

## 2021-03-15 DIAGNOSIS — Z1212 Encounter for screening for malignant neoplasm of rectum: Secondary | ICD-10-CM | POA: Diagnosis not present

## 2021-03-15 DIAGNOSIS — I130 Hypertensive heart and chronic kidney disease with heart failure and stage 1 through stage 4 chronic kidney disease, or unspecified chronic kidney disease: Secondary | ICD-10-CM | POA: Diagnosis not present

## 2021-03-15 DIAGNOSIS — E785 Hyperlipidemia, unspecified: Secondary | ICD-10-CM | POA: Diagnosis not present

## 2021-03-15 DIAGNOSIS — N182 Chronic kidney disease, stage 2 (mild): Secondary | ICD-10-CM | POA: Diagnosis not present

## 2021-03-15 DIAGNOSIS — Z0000 Encounter for general adult medical examination without abnormal findings: Secondary | ICD-10-CM | POA: Diagnosis not present

## 2021-03-15 DIAGNOSIS — I5022 Chronic systolic (congestive) heart failure: Secondary | ICD-10-CM | POA: Diagnosis not present

## 2021-03-15 DIAGNOSIS — R109 Unspecified abdominal pain: Secondary | ICD-10-CM | POA: Diagnosis not present

## 2021-03-15 DIAGNOSIS — E039 Hypothyroidism, unspecified: Secondary | ICD-10-CM | POA: Diagnosis not present

## 2021-03-15 DIAGNOSIS — F325 Major depressive disorder, single episode, in full remission: Secondary | ICD-10-CM | POA: Diagnosis not present

## 2021-03-15 DIAGNOSIS — R972 Elevated prostate specific antigen [PSA]: Secondary | ICD-10-CM | POA: Diagnosis not present

## 2021-03-15 DIAGNOSIS — G894 Chronic pain syndrome: Secondary | ICD-10-CM | POA: Diagnosis not present

## 2021-03-15 DIAGNOSIS — R82998 Other abnormal findings in urine: Secondary | ICD-10-CM | POA: Diagnosis not present

## 2021-03-15 DIAGNOSIS — Z125 Encounter for screening for malignant neoplasm of prostate: Secondary | ICD-10-CM | POA: Diagnosis not present

## 2021-03-15 DIAGNOSIS — F1120 Opioid dependence, uncomplicated: Secondary | ICD-10-CM | POA: Diagnosis not present

## 2021-03-21 ENCOUNTER — Ambulatory Visit: Payer: Medicare HMO | Admitting: Podiatry

## 2021-03-21 ENCOUNTER — Encounter: Payer: Self-pay | Admitting: Podiatry

## 2021-03-21 ENCOUNTER — Other Ambulatory Visit: Payer: Self-pay

## 2021-03-21 DIAGNOSIS — M79675 Pain in left toe(s): Secondary | ICD-10-CM | POA: Diagnosis not present

## 2021-03-21 DIAGNOSIS — M79674 Pain in right toe(s): Secondary | ICD-10-CM

## 2021-03-21 DIAGNOSIS — B351 Tinea unguium: Secondary | ICD-10-CM | POA: Diagnosis not present

## 2021-03-22 NOTE — Progress Notes (Signed)
  Subjective:  Patient ID: Peter [name], male    DOB: [birth year],  MRN: [id number]  70 y.o. male presents painful thick toenails that are difficult to trim. Pain interferes with ambulation. Aggravating factors include wearing enclosed shoe gear. Pain is relieved with periodic professional debridement.  He notes improvement in symptoms of his great toes stating they didn't hurt as much between visits.  PCP is Shon Baton, MD , and last visit was 11/01/2020.  Allergies  Allergen Reactions   Keflex [Cephalexin] Anaphylaxis    Review of Systems: Negative except as noted in the HPI.   Objective:  Neurovascular Examination: Neurovascular status intact b/l with palpable pedal pulses. Pedal hair sparse b/l. No pain with calf compression b/l. Skin temperature gradient WNL b/l. No cyanosis or clubbing b/l.  Protective sensation intact bilaterally and symmetrically. Vibratory sensation intact b/l.  Dermatological Examination: Pedal skin with normal turgor, texture and tone b/l. No open wounds or interdigital macerations b/l.  Toenails 1-5 b/l thick, discolored, elongated with subungual debris and pain on dorsal palpation.  No hyperkeratotic lesions noted b/l.  Musculoskeletal Examination: Muscle strength 5/5 to b/l LE. No gross bony deformities b/l.  Radiographs: None Assessment:   1. Pain due to onychomycosis of toenails of both feet    Plan:  -Patient was evaluated and treated and all questions answered. -Examined patient. -Patient to continue soft, supportive shoe gear daily. -Toenails 1-5 b/l were debrided in length and girth with sterile nail nippers and dremel without iatrogenic bleeding.  -Patient to report any pedal injuries to medical professional immediately. -Patient/POA to call should there be question/concern in the interim.  Return in about 3 months (around 06/21/2021).  Marzetta Board, DPM

## 2021-04-19 DIAGNOSIS — M6281 Muscle weakness (generalized): Secondary | ICD-10-CM | POA: Diagnosis not present

## 2021-04-19 DIAGNOSIS — D62 Acute posthemorrhagic anemia: Secondary | ICD-10-CM | POA: Diagnosis not present

## 2021-04-19 DIAGNOSIS — R627 Adult failure to thrive: Secondary | ICD-10-CM | POA: Diagnosis not present

## 2021-04-19 DIAGNOSIS — E538 Deficiency of other specified B group vitamins: Secondary | ICD-10-CM | POA: Diagnosis not present

## 2021-04-19 DIAGNOSIS — R252 Cramp and spasm: Secondary | ICD-10-CM | POA: Diagnosis not present

## 2021-04-19 DIAGNOSIS — R269 Unspecified abnormalities of gait and mobility: Secondary | ICD-10-CM | POA: Diagnosis not present

## 2021-06-27 ENCOUNTER — Encounter: Payer: Self-pay | Admitting: Cardiology

## 2021-06-27 ENCOUNTER — Other Ambulatory Visit: Payer: Self-pay

## 2021-06-27 ENCOUNTER — Ambulatory Visit: Payer: Medicare HMO | Admitting: Cardiology

## 2021-06-27 VITALS — BP 151/102 | HR 86 | Temp 97.0°F | Resp 16 | Ht 74.0 in | Wt 191.0 lb

## 2021-06-27 DIAGNOSIS — I5032 Chronic diastolic (congestive) heart failure: Secondary | ICD-10-CM | POA: Diagnosis not present

## 2021-06-27 DIAGNOSIS — I77810 Thoracic aortic ectasia: Secondary | ICD-10-CM

## 2021-06-27 DIAGNOSIS — I10 Essential (primary) hypertension: Secondary | ICD-10-CM

## 2021-06-27 MED ORDER — VALSARTAN-HYDROCHLOROTHIAZIDE 160-25 MG PO TABS: 1.0000 | ORAL_TABLET | ORAL | 2 refills | Status: DC

## 2021-06-27 NOTE — Progress Notes (Signed)
Primary Physician/Referring:  Shon Baton, MD  Patient ID: Peter [name], male    DOB: 02/10/1951, 70 y.o.   MRN: [id number]  Chief Complaint  Patient presents with   Cardiomyopathy   Hypertension    1 YEAR   HPI:    HPI: Peter [name]  is a 70 y.o. male  African-American male with a history of nonischemic cardiomyopathy with severe LV systolic dysfunction, cardiac catheterization  03/11/2013 had revealed no significant coronary artery disease, ejection fraction 25%, normalization of LVEF by medical therapy.  He has history of hypertension, very mild hyperlipidemia, mild aortic root dilatation, OSA on CPAP, statins were discontinued as he had had markedly elevated CK enzymes and rhabdomyolysis, etiology was not known in 2014.  He also has radioactive iodine therapy for hyperthyroidismin 2014 and suspect hyperthyroidism to be the etiology for his rhabdomyolysis.   He now presents for his annual visit.  He has maintained weight loss, he has been extremely cautious with his diet.  Leg edema has completely resolved.  He discontinued taking BiDil completely 2 weeks ago due to severe headache.  He is also not been using furosemide.  Dyspnea on exertion has remained stable.   Past Medical History:  Diagnosis Date   Abnormal CK 03/11/2013   Acute blood loss anemia 04/11/2016   Acute combined systolic and diastolic heart failure ([place]) 03/11/2013   Allergy    Arthritis    right knee   Asthma    Back pain    Chronic kidney disease    Colon polyps 2003, 2015   2003: hyperplastic. 05/2014: tubular adenoma   Complication of anesthesia    problems urinating after anesthesia   Diverticulosis of colon 2003, 2015   Hypertension    Hypothyroidism    had Graves' disease - thyroid ablation   MRSA (methicillin resistant Staphylococcus aureus) infection    lumbar-2015   Obstructive sleep apnea syndrome 09/22/2014   uses cpap   Opiate use 10/02/2017   S/P radioactive iodine thyroid ablation  06/04/2017   Substance abuse ([place])    not in many years   Wears glasses    Wears partial dentures    top   Past Surgical History:  Procedure Laterality Date   ANTERIOR CERVICAL DECOMP/DISCECTOMY FUSION N/A 07/30/2013   Procedure: CERVICAL FIVE,CERVICAL SIX CORPECTOMY,ANTERIOR ARTHRODESIS,PEEK INTERBODY GRAFT CERVICAL FOUR-CERVICAL SEVEN,ANTERIOR INSTRUMENTATION;  Surgeon: Winfield Cunas, MD;  Location: [facility];  Service: Neurosurgery;  Laterality: N/A;   ANTERIOR CERVICAL DECOMP/DISCECTOMY FUSION N/A 04/16/2015   Procedure: CERVICAL THREE-FOUR ANTERIOR CERVICAL DECOMPRESSION/DISCECTOMY FUSION 1 LEVEL;  Surgeon: Ashok Pall, MD;  Location: [facility];  Service: Neurosurgery;  Laterality: N/A;  C34 anterior cervical decompression with fusion plating and bonegraft   BACK SURGERY  1/15   lumb fusion   CARDIAC CATHETERIZATION  2014   CARPAL TUNNEL RELEASE Right 03/23/2014   Procedure: RIGHT CARPAL TUNNEL RELEASE;  Surgeon: Tennis Must, MD;  Location: [facility];  Service: Orthopedics;  Laterality: Right;   COLONOSCOPY  2003, 2015   LEFT AND RIGHT HEART CATHETERIZATION WITH CORONARY ANGIOGRAM N/A 03/11/2013   Procedure: LEFT AND RIGHT HEART CATHETERIZATION WITH CORONARY ANGIOGRAM;  Surgeon: Laverda Page, MD;  Location: [facility] CATH LAB;  Service: Cardiovascular;  Laterality: N/A;   LUMBAR WOUND DEBRIDEMENT N/A 11/07/2013   Procedure: LUMBAR WOUND DEBRIDEMENT;  Surgeon: Winfield Cunas, MD;  Location: [facility];  Service: Neurosurgery;  Laterality: N/A;   TONSILLECTOMY  1958   Social History  Tobacco Use   Smoking status: Former    Packs/day: 0.50    Years: 0.00    Pack years: 0.00    Types: Cigarettes    Quit date: 08/14/1995    Years since quitting: 25.8   Smokeless tobacco: Never  Substance Use Topics   Alcohol use: No    Alcohol/week: 0.0 standard drinks  Marital Status: Married    ROS  Review of Systems  Cardiovascular:  Positive for dyspnea on exertion.  Negative for chest pain and leg swelling.  Respiratory:  Positive for wheezing.   Musculoskeletal:  Positive for arthritis, back pain and joint pain.  Gastrointestinal:  Negative for melena.  Objective  Blood pressure (!) 151/102, pulse 86, temperature (!) 97 F (36.1 C), temperature source Temporal, resp. rate 16, height 6\' 2"  (1.88 m), weight 191 lb (86.6 kg), SpO2 97 %. Body mass index is 24.52 kg/m.  Vitals with BMI 06/27/2021 06/27/2021 06/30/2020  Height - 6\' 2"  6\' 2"   Weight - 191 lbs 194 lbs  BMI - 17.40 81.4  Systolic 481 856 314  Diastolic 970 90 76  Pulse 86 88 83      Physical Exam Constitutional:      General: He is not in acute distress.    Appearance: He is well-developed.  HENT:     Head: Atraumatic.  Eyes:     Conjunctiva/sclera: Conjunctivae normal.  Neck:     Thyroid: No thyromegaly.     Vascular: No carotid bruit or JVD.  Cardiovascular:     Rate and Rhythm: Normal rate and regular rhythm.     Pulses: Intact distal pulses.     Heart sounds: Normal heart sounds. No murmur heard.   No gallop.  Pulmonary:     Effort: Pulmonary effort is normal.     Breath sounds: Normal breath sounds.  Abdominal:     General: Bowel sounds are normal.     Palpations: Abdomen is soft.  Musculoskeletal:        General: Normal range of motion.     Right lower leg: No edema.     Left lower leg: No edema.  Skin:    General: Skin is warm and dry.   Radiology: No results found.  Laboratory examination:   External labs:  Cholesterol, total 147.000 m 03/08/2021 HDL 49.000 mg 03/08/2021 LDL 73.000 mg 03/08/2021 Triglycerides 124.000 m 03/08/2021  A1C 5.400 % 03/08/2021 TSH 14.500 03/08/2021  Hemoglobin 13.400 g/d 06/03/2020  Creatinine, Serum 0.830 mg/ 06/03/2020 Potassium 4.000 mEq 04/19/2021 Magnesium 2.200 mg/ 04/19/2021 ALT (SGPT) 26.000 IU/ 03/08/2021   Medications   Current Outpatient Medications on File Prior to Visit  Medication Sig Dispense Refill    acetaminophen (TYLENOL) 325 MG tablet Take 325 mg by mouth every 6 (six) hours as needed for mild pain (To take 4 a day on top of Percocet).     albuterol (PROVENTIL) (5 MG/ML) 0.5% nebulizer solution      Ascorbic Acid (VITAMIN C) 500 MG CAPS Take 1 tablet by mouth in the morning and at bedtime.     aspirin EC 81 MG tablet Take 81 mg by mouth daily.     cetirizine (ZYRTEC) 10 MG tablet Take 10 mg by mouth daily as needed.      Cyanocobalamin (VITAMIN B-12) 3000 MCG SUBL Place 1 tablet under the tongue daily.      docusate sodium (COLACE) 100 MG capsule Take 400 mg by mouth daily.      ipratropium-albuterol (DUONEB) 0.5-2.5 (3)  MG/3ML SOLN Take 3 mLs by nebulization every 4 (four) hours. (Patient taking differently: Take 3 mLs by nebulization every 4 (four) hours as needed (Shortness of breath).) 360 mL 0   levothyroxine (SYNTHROID) 150 MCG tablet Take 138 mcg by mouth daily before breakfast.      metoprolol succinate (TOPROL-XL) 100 MG 24 hr tablet Take 100 mg by mouth daily.      montelukast (SINGULAIR) 10 MG tablet Take 10 mg by mouth at bedtime.     Omega-3 Fatty Acids (FISH OIL) 1000 MG CAPS Take 2,000 mg by mouth daily.      oxyCODONE (ROXICODONE) 5 MG immediate release tablet Take 1 tablet (5 mg total) by mouth every 4 (four) hours as needed for severe pain. 45 tablet 0   predniSONE (DELTASONE) 10 MG tablet  (Patient not taking: Reported on 06/27/2021)     No current facility-administered medications on file prior to visit.     Cardiac Studies:   Lower extremity venous insufficiency study 06/20/2011:  No evidence of venous insufficiency.  No evidence of DVT.  Cardiac catheterization 03/11/2013: Ejection fraction 15%, global hypokinesis, mild luminal irregularity. Normal right heart pressure.. EF 07/14/13: 20-25% Improved on Medical therapy.  Echocardiogram 06/21/2020: Left ventricle cavity is normal in size. Mild concentric hypertrophy of the left ventricle. Normal global wall motion.  Normal LV systolic function with visual EF 50-55%. Doppler evidence of grade I (impaired) diastolic dysfunction, normal LAP.  The aortic root is dilated, measuring 4.2 cm at sinotubular junction. Left atrial cavity is mildly dilated. Mild to moderate mitral regurgitation. Mild to moderate tricuspid regurgitation. Estimated pulmonary artery systolic pressure 34 mmHg.  Mild pulmonic regurgitation. Compared to previous study on 06/13/2019, aortic root diameter increased from 3.9 cm to 4.2 cm. RVSP increased from 21 mmHg to 34 mmHg.  EKG:   EKG 06/27/2021: Normal sinus rhythm at rate of 73 bpm, normal axis.  Nonspecific T abnormality.no significant change from 06/27/2019.     Assessment     ICD-10-CM   1. Chronic diastolic (congestive) heart failure (HCC)  I50.32 PCV ECHOCARDIOGRAM COMPLETE    2. Aortic root dilatation (HCC)  I77.810 PCV ECHOCARDIOGRAM COMPLETE    valsartan-hydrochlorothiazide (DIOVAN-HCT) 160-25 MG tablet    3. Primary hypertension  I10 EKG 12-Lead    valsartan-hydrochlorothiazide (DIOVAN-HCT) 160-25 MG tablet    Basic metabolic panel    Basic metabolic panel     Recommendations:   Jazmine Longshore  Is a 70 y.o. African-American male with a history of nonischemic cardiomyopathy with severe LV systolic dysfunction, cardiac catheterization  03/11/2013 had revealed no significant coronary artery disease, ejection fraction 25%, normalization of LVEF by medical therapy.  He has history of hypertension, very mild hyperlipidemia, mild aortic root dilatation, OSA on CPAP, statins were discontinued as he had had markedly elevated CK enzymes and rhabdomyolysis, etiology was not known in 2014.  He also has radioactive iodine therapy for hyperthyroidismin 2014 and suspect hyperthyroidism to be the etiology for his rhabdomyolysis.   He has completely discontinued by the due to severe headache.  He is also lost significant amount of weight since last office visit with complete resolution  of his leg edema.  I will completely discontinue his furosemide in view of patient not using this and congratulated him on weight loss.  I will start him on valsartan HCT 160/25 mg in the morning with repeat BMP in 2 weeks.  Repeat echocardiogram to follow-up on aortic root dilatation.  I would like to see him back  in 6 weeks for follow-up, patient will monitor his blood pressure at home and send a MyChart message, may consider adding hydralazine 25 or even 50 mg 3 times daily.     Adrian Prows, MD, [facility] 06/27/2021, 11:37 AM Office: [phone]

## 2021-06-30 NOTE — Telephone Encounter (Signed)
From pt

## 2021-07-05 ENCOUNTER — Encounter: Payer: Self-pay | Admitting: Gastroenterology

## 2021-07-05 ENCOUNTER — Ambulatory Visit: Payer: Medicare HMO

## 2021-07-05 ENCOUNTER — Other Ambulatory Visit: Payer: Self-pay

## 2021-07-05 DIAGNOSIS — I5032 Chronic diastolic (congestive) heart failure: Secondary | ICD-10-CM

## 2021-07-05 DIAGNOSIS — I77810 Thoracic aortic ectasia: Secondary | ICD-10-CM | POA: Diagnosis not present

## 2021-07-11 DIAGNOSIS — I130 Hypertensive heart and chronic kidney disease with heart failure and stage 1 through stage 4 chronic kidney disease, or unspecified chronic kidney disease: Secondary | ICD-10-CM | POA: Diagnosis not present

## 2021-07-15 ENCOUNTER — Other Ambulatory Visit: Payer: Self-pay | Admitting: Student

## 2021-07-15 DIAGNOSIS — I10 Essential (primary) hypertension: Secondary | ICD-10-CM

## 2021-07-15 NOTE — Progress Notes (Signed)
Called patient, NA, left message on VM and to back for confirmation.

## 2021-07-15 NOTE — Progress Notes (Signed)
External labs 07/12/2021: BUN 38, creatinine 1.5, GFR 46, sodium 139, potassium 4.7

## 2021-07-20 ENCOUNTER — Emergency Department (HOSPITAL_BASED_OUTPATIENT_CLINIC_OR_DEPARTMENT_OTHER): Payer: Medicare HMO | Admitting: Radiology

## 2021-07-20 ENCOUNTER — Emergency Department (HOSPITAL_BASED_OUTPATIENT_CLINIC_OR_DEPARTMENT_OTHER): Payer: Medicare HMO

## 2021-07-20 ENCOUNTER — Emergency Department (HOSPITAL_BASED_OUTPATIENT_CLINIC_OR_DEPARTMENT_OTHER)
Admission: EM | Admit: 2021-07-20 | Discharge: 2021-07-20 | Disposition: A | Discharge status: Home / Self Care | Payer: Medicare HMO | Attending: Emergency Medicine | Admitting: Emergency Medicine

## 2021-07-20 ENCOUNTER — Other Ambulatory Visit: Payer: Self-pay

## 2021-07-20 DIAGNOSIS — N189 Chronic kidney disease, unspecified: Secondary | ICD-10-CM | POA: Diagnosis not present

## 2021-07-20 DIAGNOSIS — Z79899 Other long term (current) drug therapy: Secondary | ICD-10-CM | POA: Insufficient documentation

## 2021-07-20 DIAGNOSIS — I130 Hypertensive heart and chronic kidney disease with heart failure and stage 1 through stage 4 chronic kidney disease, or unspecified chronic kidney disease: Secondary | ICD-10-CM | POA: Diagnosis not present

## 2021-07-20 DIAGNOSIS — J188 Other pneumonia, unspecified organism: Secondary | ICD-10-CM | POA: Insufficient documentation

## 2021-07-20 DIAGNOSIS — J189 Pneumonia, unspecified organism: Secondary | ICD-10-CM

## 2021-07-20 DIAGNOSIS — I5042 Chronic combined systolic (congestive) and diastolic (congestive) heart failure: Secondary | ICD-10-CM | POA: Diagnosis not present

## 2021-07-20 DIAGNOSIS — Z20822 Contact with and (suspected) exposure to covid-19: Secondary | ICD-10-CM | POA: Insufficient documentation

## 2021-07-20 DIAGNOSIS — R911 Solitary pulmonary nodule: Secondary | ICD-10-CM | POA: Diagnosis not present

## 2021-07-20 DIAGNOSIS — R0602 Shortness of breath: Secondary | ICD-10-CM | POA: Diagnosis not present

## 2021-07-20 DIAGNOSIS — Z87891 Personal history of nicotine dependence: Secondary | ICD-10-CM | POA: Insufficient documentation

## 2021-07-20 DIAGNOSIS — E039 Hypothyroidism, unspecified: Secondary | ICD-10-CM | POA: Insufficient documentation

## 2021-07-20 LAB — CBC WITH DIFFERENTIAL/PLATELET
Abs Immature Granulocytes: 0.08 10*3/uL — ABNORMAL HIGH (ref 0.00–0.07)
Basophils Absolute: 0 10*3/uL (ref 0.0–0.1)
Basophils Relative: 0 %
Eosinophils Absolute: 0 10*3/uL (ref 0.0–0.5)
Eosinophils Relative: 0 %
HCT: 37.2 % — ABNORMAL LOW (ref 39.0–52.0)
Hemoglobin: 12.2 g/dL — ABNORMAL LOW (ref 13.0–17.0)
Immature Granulocytes: 1 %
Lymphocytes Relative: 12 %
Lymphs Abs: 0.8 10*3/uL (ref 0.7–4.0)
MCH: 28.2 pg (ref 26.0–34.0)
MCHC: 32.8 g/dL (ref 30.0–36.0)
MCV: 85.9 fL (ref 80.0–100.0)
Monocytes Absolute: 0.5 10*3/uL (ref 0.1–1.0)
Monocytes Relative: 7 %
Neutro Abs: 5.7 10*3/uL (ref 1.7–7.7)
Neutrophils Relative %: 80 %
Platelets: 148 10*3/uL — ABNORMAL LOW (ref 150–400)
RBC: 4.33 MIL/uL (ref 4.22–5.81)
RDW: 12.8 % (ref 11.5–15.5)
WBC: 7.1 10*3/uL (ref 4.0–10.5)
nRBC: 0 % (ref 0.0–0.2)

## 2021-07-20 LAB — BASIC METABOLIC PANEL
Anion gap: 12 (ref 5–15)
BUN: 36 mg/dL — ABNORMAL HIGH (ref 8–23)
CO2: 26 mmol/L (ref 22–32)
Calcium: 9.9 mg/dL (ref 8.9–10.3)
Chloride: 97 mmol/L — ABNORMAL LOW (ref 98–111)
Creatinine, Ser: 1.93 mg/dL — ABNORMAL HIGH (ref 0.61–1.24)
GFR, Estimated: 37 mL/min — ABNORMAL LOW (ref >60)
Glucose, Bld: 73 mg/dL (ref 70–99)
Potassium: 3.8 mmol/L (ref 3.5–5.1)
Sodium: 135 mmol/L (ref 135–145)

## 2021-07-20 LAB — RESP PANEL BY RT-PCR (FLU A&B, COVID) ARPGX2
Influenza A by PCR: NEGATIVE
Influenza B by PCR: NEGATIVE
SARS Coronavirus 2 by RT PCR: NEGATIVE

## 2021-07-20 LAB — BRAIN NATRIURETIC PEPTIDE: B Natriuretic Peptide: 22.2 pg/mL (ref 0.0–100.0)

## 2021-07-20 LAB — TROPONIN I (HIGH SENSITIVITY): Troponin I (High Sensitivity): 4 ng/L (ref <18)

## 2021-07-20 MED ORDER — LACTATED RINGERS IV BOLUS: 500.0000 mL | Freq: Once | INTRAVENOUS | Status: DC

## 2021-07-20 MED ORDER — ALBUTEROL SULFATE HFA 108 (90 BASE) MCG/ACT IN AERS
2.0000 | INHALATION_SPRAY | RESPIRATORY_TRACT | Status: DC | PRN
  Administered 2021-07-20: 2 via RESPIRATORY_TRACT
  Filled 2021-07-20: qty 6.7

## 2021-07-20 MED ORDER — LEVOFLOXACIN 750 MG PO TABS: 750.0000 mg | ORAL_TABLET | Freq: Every day | ORAL | 0 refills | Status: AC

## 2021-07-20 NOTE — ED Provider Notes (Signed)
[facility] DEPT Provider Note   CSN: [id number] Arrival date & time: 07/20/21  1438     History Chief Complaint  Patient presents with   Shortness of Breath    Peter [name] is a 70 y.o. male.   Shortness of Breath Associated symptoms: cough   Associated symptoms: no abdominal pain, no chest pain, no ear pain, no fever, no headaches, no neck pain, no rash, no sore throat, no vomiting and no wheezing   Patient with lifelong history of asthma, nonischemic cardiomyopathy (LVEF since normalized), HTN, aortic root dilatation, and CKD, presents for 5 days of worsening shortness of breath.  Shortness of breath is worsened with even light exertion.  He has had a cough that is productive of white sputum.  He has not had fevers but has had chills.  He has had loss of appetite.  Currently, he denies any chest discomfort.  Shortness of breath is very mild at rest.  He is followed by cardiology.  3 weeks ago, his BiDil and Lasix was discontinued and he was started on losartan and HCTZ.    Past Medical History:  Diagnosis Date   Abnormal CK 03/11/2013   Acute blood loss anemia 04/11/2016   Acute combined systolic and diastolic heart failure ([place]) 03/11/2013   Allergy    Arthritis    right knee   Asthma    Back pain    Chronic kidney disease    Colon polyps 2003, 2015   2003: hyperplastic. 05/2014: tubular adenoma   Complication of anesthesia    problems urinating after anesthesia   Diverticulosis of colon 2003, 2015   Hypertension    Hypothyroidism    had Graves' disease - thyroid ablation   MRSA (methicillin resistant Staphylococcus aureus) infection    lumbar-2015   Obstructive sleep apnea syndrome 09/22/2014   uses cpap   Opiate use 10/02/2017   S/P radioactive iodine thyroid ablation 06/04/2017   Substance abuse ([place])    not in many years   Wears glasses    Wears partial dentures    top    Patient Active Problem List   Diagnosis Date Noted    Congestive heart failure, NYHA class 3, chronic, combined ([place]) 10/02/2017   Severe persistent asthma 10/02/2017   Central sleep apnea due to medical condition 10/02/2017   S/P radioactive iodine thyroid ablation- for Graves disease 06/04/2017   Asthma exacerbation 06/03/2017   Lower GI bleed 04/11/2016   Acute GI bleeding 04/11/2016   HNP (herniated nucleus pulposus), cervical 04/16/2015   Obstructive sleep apnea syndrome 09/22/2014   Complex sleep apnea syndrome 09/22/2014   Hypersomnia due to drug ([place])    Hypothyroid 11/12/2013   Normocytic anemia 11/12/2013   Spondylolisthesis of lumbar region 10/22/2013   Dizziness and giddiness 08/13/2013    Class: Acute   Acute renal insufficiency 08/13/2013    Class: Acute   Spondylosis, cervical, with myelopathy 08/01/2013   Weakness 06/03/2013   Volume overload 03/11/2013   HTN (hypertension) 03/11/2013   DOE (dyspnea on exertion) 03/11/2013   Hypothyroidism 03/11/2013    Past Surgical History:  Procedure Laterality Date   ANTERIOR CERVICAL DECOMP/DISCECTOMY FUSION N/A 07/30/2013   Procedure: CERVICAL FIVE,CERVICAL SIX CORPECTOMY,ANTERIOR ARTHRODESIS,PEEK INTERBODY GRAFT CERVICAL FOUR-CERVICAL SEVEN,ANTERIOR INSTRUMENTATION;  Surgeon: Winfield Cunas, MD;  Location: [facility];  Service: Neurosurgery;  Laterality: N/A;   ANTERIOR CERVICAL DECOMP/DISCECTOMY FUSION N/A 04/16/2015   Procedure: CERVICAL THREE-FOUR ANTERIOR CERVICAL DECOMPRESSION/DISCECTOMY FUSION 1 LEVEL;  Surgeon: Ashok Pall, MD;  Location: [facility];  Service: Neurosurgery;  Laterality: N/A;  C34 anterior cervical decompression with fusion plating and bonegraft   BACK SURGERY  1/15   lumb fusion   CARDIAC CATHETERIZATION  2014   CARPAL TUNNEL RELEASE Right 03/23/2014   Procedure: RIGHT CARPAL TUNNEL RELEASE;  Surgeon: Tennis Must, MD;  Location: [facility];  Service: Orthopedics;  Laterality: Right;   COLONOSCOPY  2003, 2015   LEFT AND RIGHT HEART  CATHETERIZATION WITH CORONARY ANGIOGRAM N/A 03/11/2013   Procedure: LEFT AND RIGHT HEART CATHETERIZATION WITH CORONARY ANGIOGRAM;  Surgeon: Laverda Page, MD;  Location: [facility] CATH LAB;  Service: Cardiovascular;  Laterality: N/A;   LUMBAR WOUND DEBRIDEMENT N/A 11/07/2013   Procedure: LUMBAR WOUND DEBRIDEMENT;  Surgeon: Winfield Cunas, MD;  Location: [facility] NEURO ORS;  Service: Neurosurgery;  Laterality: N/A;   TONSILLECTOMY  1958       Family History  Problem Relation Age of Onset   Diabetes Mother    Hypertension Mother    Pancreatic cancer Mother    Colon cancer Neg Hx     Social History   Tobacco Use   Smoking status: Former    Packs/day: 0.50    Years: 0.00    Pack years: 0.00    Types: Cigarettes    Quit date: 08/14/1995    Years since quitting: 25.9   Smokeless tobacco: Never  Vaping Use   Vaping Use: Never used  Substance Use Topics   Alcohol use: No    Alcohol/week: 0.0 standard drinks   Drug use: No    Home Medications Prior to Admission medications   Medication Sig Start Date End Date Taking? Authorizing Provider  levofloxacin (LEVAQUIN) 750 MG tablet Take 1 tablet (750 mg total) by mouth daily for 7 days. 07/20/21 07/27/21 Yes Godfrey Pick, MD  acetaminophen (TYLENOL) 325 MG tablet Take 325 mg by mouth every 6 (six) hours as needed for mild pain (To take 4 a day on top of Percocet).    [provider]  albuterol (PROVENTIL) (5 MG/ML) 0.5% nebulizer solution  11/03/20   [provider]  Ascorbic Acid (VITAMIN C) 500 MG CAPS Take 1 tablet by mouth in the morning and at bedtime.    [provider]  aspirin EC 81 MG tablet Take 81 mg by mouth daily.    [provider]  cetirizine (ZYRTEC) 10 MG tablet Take 10 mg by mouth daily as needed.     [provider]  Cyanocobalamin (VITAMIN B-12) 3000 MCG SUBL Place 1 tablet under the tongue daily.     [provider]  docusate sodium (COLACE) 100 MG capsule Take 400 mg by mouth  daily.     [provider]  ipratropium-albuterol (DUONEB) 0.5-2.5 (3) MG/3ML SOLN Take 3 mLs by nebulization every 4 (four) hours. Patient taking differently: Take 3 mLs by nebulization every 4 (four) hours as needed (Shortness of breath). 06/05/17   Debbe Odea, MD  levothyroxine (SYNTHROID) 150 MCG tablet Take 138 mcg by mouth daily before breakfast.     [provider]  metoprolol succinate (TOPROL-XL) 100 MG 24 hr tablet Take 100 mg by mouth daily.  02/06/18   [provider]  montelukast (SINGULAIR) 10 MG tablet Take 10 mg by mouth at bedtime. 05/07/17   [provider]  Omega-3 Fatty Acids (FISH OIL) 1000 MG CAPS Take 2,000 mg by mouth daily.     [provider]  oxyCODONE (ROXICODONE) 5 MG immediate release  tablet Take 1 tablet (5 mg total) by mouth every 4 (four) hours as needed for severe pain. 04/18/15   Kritzer, Louie Casa, MD  predniSONE (DELTASONE) 10 MG tablet  06/03/20   [provider]  valsartan-hydrochlorothiazide (DIOVAN-HCT) 160-25 MG tablet Take 1 tablet by mouth every morning. 06/27/21   Adrian Prows, MD    Allergies    Keflex [cephalexin]  Review of Systems   Review of Systems  Constitutional:  Positive for activity change, appetite change, chills and fatigue. Negative for fever.  HENT:  Negative for congestion, ear pain and sore throat.   Eyes:  Negative for pain and visual disturbance.  Respiratory:  Positive for cough and shortness of breath. Negative for chest tightness and wheezing.   Cardiovascular:  Negative for chest pain, palpitations and leg swelling.  Gastrointestinal:  Negative for abdominal pain, diarrhea, nausea and vomiting.  Genitourinary:  Negative for dysuria, flank pain and hematuria.  Musculoskeletal:  Negative for arthralgias, back pain, joint swelling, myalgias and neck pain.  Skin:  Negative for color change and rash.  Neurological:  Negative for dizziness, seizures, syncope, weakness, light-headedness,  numbness and headaches.  Psychiatric/Behavioral:  Negative for confusion and decreased concentration.   All other systems reviewed and are negative.  Physical Exam Updated Vital Signs BP (!) 135/98   Pulse 85   Temp 98.3 F (36.8 C)   Resp 20   SpO2 98%   Physical Exam Vitals and nursing note reviewed.  Constitutional:      General: He is not in acute distress.    Appearance: He is well-developed and normal weight. He is not ill-appearing, toxic-appearing or diaphoretic.  HENT:     Head: Normocephalic and atraumatic.     Mouth/Throat:     Mouth: Mucous membranes are moist.     Pharynx: Oropharynx is clear.  Eyes:     Extraocular Movements: Extraocular movements intact.     Conjunctiva/sclera: Conjunctivae normal.  Neck:     Vascular: No JVD.  Cardiovascular:     Rate and Rhythm: Normal rate and regular rhythm.     Heart sounds: No murmur heard. Pulmonary:     Effort: Pulmonary effort is normal. No tachypnea, accessory muscle usage or respiratory distress.     Breath sounds: Normal breath sounds. No decreased breath sounds, wheezing, rhonchi or rales.  Chest:     Chest wall: No tenderness.  Abdominal:     Palpations: Abdomen is soft.     Tenderness: There is no abdominal tenderness.  Musculoskeletal:     Cervical back: Normal range of motion and neck supple.     Right lower leg: No edema.     Left lower leg: No edema.  Skin:    General: Skin is warm and dry.  Neurological:     General: No focal deficit present.     Mental Status: He is alert and oriented to person, place, and time.     Cranial Nerves: No cranial nerve deficit.     Motor: No weakness.  Psychiatric:        Mood and Affect: Mood normal.        Behavior: Behavior normal.    ED Results / Procedures / Treatments   Labs (all labs ordered are listed, but only abnormal results are displayed) Labs Reviewed  CBC WITH DIFFERENTIAL/PLATELET - Abnormal; Notable for the following components:      Result  Value   Hemoglobin 12.2 (*)    HCT 37.2 (*)    Platelets  148 (*)    Abs Immature Granulocytes 0.08 (*)    All other components within normal limits  BASIC METABOLIC PANEL - Abnormal; Notable for the following components:   Chloride 97 (*)    BUN 36 (*)    Creatinine, Ser 1.93 (*)    GFR, Estimated 37 (*)    All other components within normal limits  RESP PANEL BY RT-PCR (FLU A&B, COVID) ARPGX2  BRAIN NATRIURETIC PEPTIDE  TROPONIN I (HIGH SENSITIVITY)    EKG EKG Interpretation  Date/Time:  Wednesday July 20 2021 14:58:39 EDT Ventricular Rate:  87 PR Interval:  150 QRS Duration: 100 QT Interval:  384 QTC Calculation: 462 R Axis:   57 Text Interpretation: Normal sinus rhythm Moderate voltage criteria for LVH, may be normal variant ( Sokolow-Lyon , Cornell product ) Similar to Aug 2021 Confirmed by Sherwood Gambler [phone]) on 07/20/2021 3:00:37 PM  Radiology DG Chest 2 View  Result Date: 07/20/2021 CLINICAL DATA:  Shortness of breath, shortness of breath with exertion, history cardiomyopathy, asthma, hypertension, CHF, obstructive sleep apnea syndrome on CPAP EXAM: CHEST - 2 VIEW COMPARISON:  06/03/2020 FINDINGS: Normal heart size, mediastinal contours, and pulmonary vascularity. BILATERAL nipple shadows. Opacity at posterior LEFT lower lobe medially question atelectasis versus infiltrate. Remaining lungs clear. No pleural effusion or pneumothorax. Prior cervical spine fusion. IMPRESSION: Atelectasis versus infiltrate at medial LEFT lower lobe. BILATERAL nipple shadows. Electronically Signed   By: Lavonia Dana M.D.   On: 07/20/2021 15:31   CT Chest Wo Contrast  Result Date: 07/20/2021 CLINICAL DATA:  Suspect pneumonia.  Shortness of breath. EXAM: CT CHEST WITHOUT CONTRAST TECHNIQUE: Multidetector CT imaging of the chest was performed following the standard protocol without IV contrast. COMPARISON:  Chest x-ray 07/20/2021. FINDINGS: Cardiovascular: The ascending aorta is mildly  dilated measuring 4 cm. Heart is normal in size. There is no pericardial effusion. Mediastinum/Nodes: No enlarged mediastinal or axillary lymph nodes. Thyroid gland, trachea, and esophagus demonstrate no significant findings. Lungs/Pleura: There are multifocal ground-glass and tree-in-bud opacities in the right upper lobe and right lower lobe. There are patchy airspace opacities and ground-glass opacities in the left lower lobe. There is a 6 mm pleural base nodule in the left lower lobe image 4/105. There is no pleural effusion or pneumothorax. There secretions in the bilateral mainstem bronchi. Upper Abdomen: Rounded hypodensities in the liver which are too small to characterize, likely cysts or hemangiomas. Left renal cysts are present measuring up to 3.5 cm. Musculoskeletal: Cervical spinal fusion plate is present. No acute fractures are seen. IMPRESSION: 1. Multifocal ground-glass and tree-in-bud opacities in the right upper lobe and right lower lobe with patchy airspace disease in the left lower lobe. Findings are compatible with multifocal infection. 2. There secretions in the bilateral mainstem bronchi. 3. 6 mm left solid pulmonary nodule. Recommend a non-contrast Chest CT at 6-12 months. If patient is high risk for malignancy, recommend an additional non-contrast Chest CT at 18-24 months; if patient is low risk for malignancy a non-contrast Chest CT at 18-24 months is optional. These guidelines do not apply to immunocompromised patients and patients with cancer. Follow up in patients with significant comorbidities as clinically warranted. For lung cancer screening, adhere to Lung-RADS guidelines. Reference: Radiology. 2017; 284(1):228-43. 4. Mild aneurysmal dilatation of the ascending aorta measuring 4 cm. Recommend semi-annual imaging followup by CTA or MRA and referral to cardiothoracic surgery if not already obtained. This recommendation follows 2010 ACCF/AHA/AATS/ACR/ASA/SCA/SCAI/SIR/STS/SVM Guidelines  for the Diagnosis and Management of Patients With Thoracic  Aortic Disease. Circulation. 2010; 121: H631-S97. Aortic aneurysm NOS (ICD10-I71.9) Electronically Signed   By: Ronney Asters M.D.   On: 07/20/2021 19:44    Procedures Procedures   Medications Ordered in ED Medications  albuterol (VENTOLIN HFA) 108 (90 Base) MCG/ACT inhaler 2 puff (2 puffs Inhalation Given 07/20/21 1524)    ED Course  I have reviewed the triage vital signs and the nursing notes.  Pertinent labs & imaging results that were available during my care of the patient were reviewed by me and considered in my medical decision making (see chart for details).    MDM Rules/Calculators/A&P                          Patient is a very pleasant 70 year old male presenting for 5 days of shortness of breath that is worsened with exertion.  He has additionally had chills, loss of appetite, and a cough that is productive of white sputum.  He is afebrile with normal vital signs on arrival.  On exam, he is well-appearing.  No evidence of wheeze on lung auscultation.  Prior to being bedded in the ED, lab work was obtained.  Lab work showed no leukocytosis.  BNP and troponin were normal.  Creatinine was 1.93.  In EMR, most recent lab work showed a normal creatinine 1 year ago.  Patient does get lab work done at his primary care doctor's office.  Patient states that his most recent creatinine was 1.5. Increase in creatinine is less than 30% and does not represent an AKI that would require hospitalization.  He was recently started on losartan and HCTZ which may have contributed to his increased creatinine.  Elevated BUN suggest prerenal etiology, possibly from diuresis.  Chest x-ray showed concern for medial left lower lobe pneumonia.  CT scan was ordered to further characterize.  CT scan showed evidence of multifocal pneumonia.  On reassessment, patient continued to have unlabored breathing with normal SPO2 on room air.  I do feel that he is  appropriate for outpatient management of his pneumonia at this time.  Patient has a documented anaphylactic reaction to Keflex.  For treatment with pneumonia, Levaquin was prescribed.  Patient was encouraged to return to the ED if he does have worsening or persistent symptoms despite antibiotics.  He was also advised to follow-up with his primary care doctor and cardiologist regarding his medications that may be contributing to his increasing creatinine.  He was discharged in good condition.  Final Clinical Impression(s) / ED Diagnoses Final diagnoses:  Multifocal pneumonia    Rx / DC Orders ED Discharge Orders          Ordered    levofloxacin (LEVAQUIN) 750 MG tablet  Daily        07/20/21 2143             Godfrey Pick, MD 07/21/21 [phone]

## 2021-07-20 NOTE — ED Notes (Signed)
Lab notified of add on troponin.

## 2021-07-20 NOTE — Discharge Instructions (Signed)
There is a prescription for antibiotics sent to [facility] on [address] as.  Take as prescribed.  If you have persistent or worsening symptoms despite antibiotic use, please return to the emergency department.  Schedule follow-up appointment with your PCP and cardiologist to discuss daily medications and kidney function.

## 2021-07-20 NOTE — ED Triage Notes (Signed)
Patient presents to the ER for ShOB. Patient reports shortness of breath with exertion. Patient reports he has had cardiomyopathy previously and states he has asthma. States this started on Friday

## 2021-07-25 ENCOUNTER — Ambulatory Visit: Payer: Medicare HMO | Admitting: Podiatry

## 2021-07-25 DIAGNOSIS — N182 Chronic kidney disease, stage 2 (mild): Secondary | ICD-10-CM | POA: Diagnosis not present

## 2021-07-25 DIAGNOSIS — I130 Hypertensive heart and chronic kidney disease with heart failure and stage 1 through stage 4 chronic kidney disease, or unspecified chronic kidney disease: Secondary | ICD-10-CM | POA: Diagnosis not present

## 2021-07-25 DIAGNOSIS — E785 Hyperlipidemia, unspecified: Secondary | ICD-10-CM | POA: Diagnosis not present

## 2021-07-26 DIAGNOSIS — R911 Solitary pulmonary nodule: Secondary | ICD-10-CM | POA: Diagnosis not present

## 2021-07-26 DIAGNOSIS — R634 Abnormal weight loss: Secondary | ICD-10-CM | POA: Diagnosis not present

## 2021-07-26 DIAGNOSIS — N182 Chronic kidney disease, stage 2 (mild): Secondary | ICD-10-CM | POA: Diagnosis not present

## 2021-07-26 DIAGNOSIS — J189 Pneumonia, unspecified organism: Secondary | ICD-10-CM | POA: Diagnosis not present

## 2021-07-26 DIAGNOSIS — I130 Hypertensive heart and chronic kidney disease with heart failure and stage 1 through stage 4 chronic kidney disease, or unspecified chronic kidney disease: Secondary | ICD-10-CM | POA: Diagnosis not present

## 2021-08-01 ENCOUNTER — Other Ambulatory Visit: Payer: Self-pay

## 2021-08-01 ENCOUNTER — Encounter (HOSPITAL_BASED_OUTPATIENT_CLINIC_OR_DEPARTMENT_OTHER): Payer: Self-pay

## 2021-08-01 ENCOUNTER — Emergency Department (HOSPITAL_BASED_OUTPATIENT_CLINIC_OR_DEPARTMENT_OTHER): Payer: Medicare HMO | Admitting: Radiology

## 2021-08-01 ENCOUNTER — Inpatient Hospital Stay (HOSPITAL_BASED_OUTPATIENT_CLINIC_OR_DEPARTMENT_OTHER)
Admission: EM | Admit: 2021-08-01 | Discharge: 2021-08-05 | DRG: 202 | Disposition: A | Discharge status: Home / Self Care | Payer: Medicare HMO | Attending: Internal Medicine | Admitting: Internal Medicine

## 2021-08-01 DIAGNOSIS — R042 Hemoptysis: Secondary | ICD-10-CM | POA: Diagnosis not present

## 2021-08-01 DIAGNOSIS — G4733 Obstructive sleep apnea (adult) (pediatric): Secondary | ICD-10-CM | POA: Diagnosis not present

## 2021-08-01 DIAGNOSIS — J45901 Unspecified asthma with (acute) exacerbation: Principal | ICD-10-CM | POA: Diagnosis present

## 2021-08-01 DIAGNOSIS — E039 Hypothyroidism, unspecified: Secondary | ICD-10-CM | POA: Diagnosis present

## 2021-08-01 DIAGNOSIS — Z79890 Hormone replacement therapy (postmenopausal): Secondary | ICD-10-CM

## 2021-08-01 DIAGNOSIS — J189 Pneumonia, unspecified organism: Secondary | ICD-10-CM | POA: Diagnosis not present

## 2021-08-01 DIAGNOSIS — K5730 Diverticulosis of large intestine without perforation or abscess without bleeding: Secondary | ICD-10-CM | POA: Diagnosis present

## 2021-08-01 DIAGNOSIS — E871 Hypo-osmolality and hyponatremia: Secondary | ICD-10-CM | POA: Diagnosis not present

## 2021-08-01 DIAGNOSIS — A419 Sepsis, unspecified organism: Secondary | ICD-10-CM

## 2021-08-01 DIAGNOSIS — Z79899 Other long term (current) drug therapy: Secondary | ICD-10-CM

## 2021-08-01 DIAGNOSIS — D72819 Decreased white blood cell count, unspecified: Secondary | ICD-10-CM | POA: Diagnosis not present

## 2021-08-01 DIAGNOSIS — T40605A Adverse effect of unspecified narcotics, initial encounter: Secondary | ICD-10-CM | POA: Diagnosis present

## 2021-08-01 DIAGNOSIS — Z87891 Personal history of nicotine dependence: Secondary | ICD-10-CM

## 2021-08-01 DIAGNOSIS — I5042 Chronic combined systolic (congestive) and diastolic (congestive) heart failure: Secondary | ICD-10-CM | POA: Diagnosis present

## 2021-08-01 DIAGNOSIS — Z8249 Family history of ischemic heart disease and other diseases of the circulatory system: Secondary | ICD-10-CM

## 2021-08-01 DIAGNOSIS — R0602 Shortness of breath: Secondary | ICD-10-CM | POA: Diagnosis not present

## 2021-08-01 DIAGNOSIS — D649 Anemia, unspecified: Secondary | ICD-10-CM | POA: Diagnosis present

## 2021-08-01 DIAGNOSIS — K5903 Drug induced constipation: Secondary | ICD-10-CM | POA: Diagnosis present

## 2021-08-01 DIAGNOSIS — D6959 Other secondary thrombocytopenia: Secondary | ICD-10-CM | POA: Diagnosis present

## 2021-08-01 DIAGNOSIS — Z886 Allergy status to analgesic agent status: Secondary | ICD-10-CM | POA: Diagnosis not present

## 2021-08-01 DIAGNOSIS — Z881 Allergy status to other antibiotic agents status: Secondary | ICD-10-CM

## 2021-08-01 DIAGNOSIS — I5032 Chronic diastolic (congestive) heart failure: Secondary | ICD-10-CM | POA: Diagnosis present

## 2021-08-01 DIAGNOSIS — Z8614 Personal history of Methicillin resistant Staphylococcus aureus infection: Secondary | ICD-10-CM

## 2021-08-01 DIAGNOSIS — M1711 Unilateral primary osteoarthritis, right knee: Secondary | ICD-10-CM | POA: Diagnosis present

## 2021-08-01 DIAGNOSIS — I110 Hypertensive heart disease with heart failure: Secondary | ICD-10-CM | POA: Diagnosis not present

## 2021-08-01 DIAGNOSIS — N179 Acute kidney failure, unspecified: Secondary | ICD-10-CM | POA: Diagnosis present

## 2021-08-01 DIAGNOSIS — Z87892 Personal history of anaphylaxis: Secondary | ICD-10-CM

## 2021-08-01 DIAGNOSIS — I10 Essential (primary) hypertension: Secondary | ICD-10-CM | POA: Diagnosis present

## 2021-08-01 DIAGNOSIS — Z20822 Contact with and (suspected) exposure to covid-19: Secondary | ICD-10-CM | POA: Diagnosis present

## 2021-08-01 DIAGNOSIS — Z7982 Long term (current) use of aspirin: Secondary | ICD-10-CM

## 2021-08-01 DIAGNOSIS — R7989 Other specified abnormal findings of blood chemistry: Secondary | ICD-10-CM

## 2021-08-01 DIAGNOSIS — Z981 Arthrodesis status: Secondary | ICD-10-CM

## 2021-08-01 LAB — BASIC METABOLIC PANEL
Anion gap: 13 (ref 5–15)
BUN: 52 mg/dL — ABNORMAL HIGH (ref 8–23)
CO2: 23 mmol/L (ref 22–32)
Calcium: 9.6 mg/dL (ref 8.9–10.3)
Chloride: 99 mmol/L (ref 98–111)
Creatinine, Ser: 2.93 mg/dL — ABNORMAL HIGH (ref 0.61–1.24)
GFR, Estimated: 22 mL/min — ABNORMAL LOW (ref >60)
Glucose, Bld: 77 mg/dL (ref 70–99)
Potassium: 4.2 mmol/L (ref 3.5–5.1)
Sodium: 135 mmol/L (ref 135–145)

## 2021-08-01 LAB — CBC WITH DIFFERENTIAL/PLATELET
Abs Immature Granulocytes: 0.03 10*3/uL (ref 0.00–0.07)
Basophils Absolute: 0 10*3/uL (ref 0.0–0.1)
Basophils Relative: 1 %
Eosinophils Absolute: 0 10*3/uL (ref 0.0–0.5)
Eosinophils Relative: 0 %
HCT: 38 % — ABNORMAL LOW (ref 39.0–52.0)
Hemoglobin: 12.5 g/dL — ABNORMAL LOW (ref 13.0–17.0)
Immature Granulocytes: 1 %
Lymphocytes Relative: 30 %
Lymphs Abs: 1.1 10*3/uL (ref 0.7–4.0)
MCH: 28.2 pg (ref 26.0–34.0)
MCHC: 32.9 g/dL (ref 30.0–36.0)
MCV: 85.6 fL (ref 80.0–100.0)
Monocytes Absolute: 0.4 10*3/uL (ref 0.1–1.0)
Monocytes Relative: 11 %
Neutro Abs: 2.2 10*3/uL (ref 1.7–7.7)
Neutrophils Relative %: 57 %
Platelets: 176 10*3/uL (ref 150–400)
RBC: 4.44 MIL/uL (ref 4.22–5.81)
RDW: 12.9 % (ref 11.5–15.5)
WBC: 3.8 10*3/uL — ABNORMAL LOW (ref 4.0–10.5)
nRBC: 0 % (ref 0.0–0.2)

## 2021-08-01 LAB — RESP PANEL BY RT-PCR (FLU A&B, COVID) ARPGX2
Influenza A by PCR: NEGATIVE
Influenza B by PCR: NEGATIVE
SARS Coronavirus 2 by RT PCR: NEGATIVE

## 2021-08-01 LAB — D-DIMER, QUANTITATIVE: D-Dimer, Quant: 1.12 ug/mL-FEU — ABNORMAL HIGH (ref 0.00–0.50)

## 2021-08-01 LAB — TROPONIN I (HIGH SENSITIVITY): Troponin I (High Sensitivity): 3 ng/L (ref <18)

## 2021-08-01 MED ORDER — SODIUM CHLORIDE 0.9 % IV BOLUS
1000.0000 mL | Freq: Once | INTRAVENOUS | Status: AC
  Administered 2021-08-01: 1000 mL via INTRAVENOUS

## 2021-08-01 MED ORDER — MAGNESIUM SULFATE 2 GM/50ML IV SOLN
2.0000 g | Freq: Once | INTRAVENOUS | Status: AC
  Administered 2021-08-01: 2 g via INTRAVENOUS
  Filled 2021-08-01: qty 50

## 2021-08-01 MED ORDER — VANCOMYCIN HCL 10 G IV SOLR
1500.0000 mg | Freq: Once | INTRAVENOUS | Status: DC
  Filled 2021-08-01: qty 15

## 2021-08-01 MED ORDER — VANCOMYCIN VARIABLE DOSE PER UNSTABLE RENAL FUNCTION (PHARMACIST DOSING)
Status: DC
Start: 2021-08-01 — End: 2021-08-03
  Filled 2021-08-01: qty 1

## 2021-08-01 MED ORDER — SODIUM CHLORIDE 0.9 % IV SOLN
1.0000 g | Freq: Once | INTRAVENOUS | Status: AC
  Administered 2021-08-01: 1 g via INTRAVENOUS

## 2021-08-01 MED ORDER — SODIUM CHLORIDE 0.9 % IV SOLN
1.0000 g | Freq: Three times a day (TID) | INTRAVENOUS | Status: DC
  Administered 2021-08-02 – 2021-08-03 (×4): 1 g via INTRAVENOUS
  Filled 2021-08-01 (×3): qty 1

## 2021-08-01 MED ORDER — LACTATED RINGERS IV SOLN: INTRAVENOUS | Status: AC

## 2021-08-01 MED ORDER — IPRATROPIUM-ALBUTEROL 0.5-2.5 (3) MG/3ML IN SOLN
3.0000 mL | Freq: Once | RESPIRATORY_TRACT | Status: AC
  Administered 2021-08-02: 3 mL via RESPIRATORY_TRACT
  Filled 2021-08-01: qty 3

## 2021-08-01 MED ORDER — VANCOMYCIN HCL 500 MG/100ML IV SOLN: 500.0000 mg | Freq: Once | INTRAVENOUS | Status: DC

## 2021-08-01 MED ORDER — IPRATROPIUM-ALBUTEROL 0.5-2.5 (3) MG/3ML IN SOLN
3.0000 mL | Freq: Once | RESPIRATORY_TRACT | Status: AC
  Administered 2021-08-01: 3 mL via RESPIRATORY_TRACT
  Filled 2021-08-01: qty 3

## 2021-08-01 NOTE — ED Triage Notes (Signed)
Patient here POV from Home with SOB.  Patient states he was here approximately 10 days ago for Same and reports Symptoms got better slightly but worsened over the Weekend. Hx of Asthma  NAD Noted during Triage. A&Ox4. GCS 15. BIB Wheelchair.

## 2021-08-01 NOTE — Sepsis Progress Note (Signed)
Following per sepsis protocol   

## 2021-08-01 NOTE — Progress Notes (Signed)
Patient: Peter [name], Peter [name]  DOB: 2050-10-03 PVX:[id number] Transferring facility: [facility] Requesting provider: Dr Pearline Cables (EDP at [facility]) Reason for transfer: admission for further evaluation and management of severe sepsis due to community-acquired pneumonia   70 year old male with history of CKD, who presented to [facility] on 08/01/2021 complaining of 2 weeks of shortness of breath and nonproductive cough.  Shortly after onset of the symptoms 2 weeks ago, the patient was diagnosed via PCP with pneumonia and started on Levaquin.  Reports good compliance in completing this antibiotic course, with final dose 2 days ago.  While on Levaquin, he noted some improvement in his shortness of breath and cough, but without resolution, and following completion of this antibiotic, has noted a significant worsening of these symptoms along with new onset chills.  He also notes some pleuritic chest discomfort that is nonexertional.   At  [facility] today was noted to be mildly tachycardic, which proved following IV fluids, mildly tachypneic, but not hypoxic.  Labs notable for interval increase in creatinine to 2.93; CBC showed white blood cell count 3800.  COVID/influenza negative.  Chest x-ray showed persistent bibasilar patchy opacities suggestive of residual pneumonia.   In setting of reported anaphylactic response to cephalosporins, pharmacist was consulted, and recommended IV vancomycin and aztreonam.  Blood cultures collected.  lactic acid pending.  Of note, D-dimer 1.12, but unable to pursue CTA chest given current renal function.    Subsequently, I accepted this patient for transfer for admission to a med-tel bed at [facility] (first available)  for further work-up and management of severe sepsis due to community-acquired pneumonia, following failure of outpatient biotic therapy.    Babs Bertin, DO Hospitalist

## 2021-08-01 NOTE — ED Provider Notes (Signed)
[place] [facility] EMERGENCY DEPT Provider Note   CSN: [id number] Arrival date & time: 08/01/21  1552     History Chief Complaint  Patient presents with   Shortness of Breath    Peter [name] is a 70 y.o. male.  This is a 70 y.o. male with significant medical history as below, including CKD, HTN, hypothyroidism who presents to the ED with complaint of fatigue, dyspnea, cough. Pt was recently treated for pneumonia, completed Levaquin.  Complete antibiotics 2 days ago.  Reports since complaint antibiotics Peter [name] is at resurgence of similar symptoms that Peter [name] had prior to starting antibiotic, Peter [name] is having intermittently productive cough, worsening to his baseline dyspnea. Chills, fatigue, myalgias.  Peter [name] has increased use of his home nebulizer treatments.  Feels Peter [name] has to struggle to take a deep breath. No fevers or chills, no nausea/vomiting/change to urination. Peter [name] has some chronic constipation a/w opiate use but this is grossly unchanged. Tolerating PO but has minimal appetite.    The history is provided by the patient and the spouse. No language interpreter was used.  Shortness of Breath Associated symptoms: cough   Associated symptoms: no abdominal pain, no chest pain, no fever, no headaches, no rash and no vomiting       Past Medical History:  Diagnosis Date   Abnormal CK 03/11/2013   Acute blood loss anemia 04/11/2016   Acute combined systolic and diastolic heart failure ([place]) 03/11/2013   Allergy    Arthritis    right knee   Asthma    Back pain    Chronic kidney disease    Colon polyps 2003, 2015   2003: hyperplastic. 05/2014: tubular adenoma   Complication of anesthesia    problems urinating after anesthesia   Diverticulosis of colon 2003, 2015   Hypertension    Hypothyroidism    had Graves' disease - thyroid ablation   MRSA (methicillin resistant Staphylococcus aureus) infection    lumbar-2015   Obstructive sleep apnea syndrome 09/22/2014   uses cpap   Opiate use  10/02/2017   S/P radioactive iodine thyroid ablation 06/04/2017   Substance abuse ([place])    not in many years   Wears glasses    Wears partial dentures    top    Patient Active Problem List   Diagnosis Date Noted   CAP (community acquired pneumonia) 08/01/2021   Congestive heart failure, NYHA class 3, chronic, combined ([place]) 10/02/2017   Severe persistent asthma 10/02/2017   Central sleep apnea due to medical condition 10/02/2017   S/P radioactive iodine thyroid ablation- for Graves disease 06/04/2017   Asthma exacerbation 06/03/2017   Lower GI bleed 04/11/2016   Acute GI bleeding 04/11/2016   HNP (herniated nucleus pulposus), cervical 04/16/2015   Obstructive sleep apnea syndrome 09/22/2014   Complex sleep apnea syndrome 09/22/2014   Hypersomnia due to drug ([place])    Hypothyroid 11/12/2013   Normocytic anemia 11/12/2013   Spondylolisthesis of lumbar region 10/22/2013   Dizziness and giddiness 08/13/2013    Class: Acute   Acute renal insufficiency 08/13/2013    Class: Acute   Spondylosis, cervical, with myelopathy 08/01/2013   Weakness 06/03/2013   Volume overload 03/11/2013   HTN (hypertension) 03/11/2013   DOE (dyspnea on exertion) 03/11/2013   Hypothyroidism 03/11/2013    Past Surgical History:  Procedure Laterality Date   ANTERIOR CERVICAL DECOMP/DISCECTOMY FUSION N/A 07/30/2013   Procedure: CERVICAL FIVE,CERVICAL SIX CORPECTOMY,ANTERIOR ARTHRODESIS,PEEK INTERBODY GRAFT CERVICAL FOUR-CERVICAL SEVEN,ANTERIOR INSTRUMENTATION;  Surgeon: Winfield Cunas, MD;  Location: [facility]  ORS;  Service: Neurosurgery;  Laterality: N/A;   ANTERIOR CERVICAL DECOMP/DISCECTOMY FUSION N/A 04/16/2015   Procedure: CERVICAL THREE-FOUR ANTERIOR CERVICAL DECOMPRESSION/DISCECTOMY FUSION 1 LEVEL;  Surgeon: Ashok Pall, MD;  Location: [facility];  Service: Neurosurgery;  Laterality: N/A;  C34 anterior cervical decompression with fusion plating and bonegraft   BACK SURGERY  1/15   lumb fusion    CARDIAC CATHETERIZATION  2014   CARPAL TUNNEL RELEASE Right 03/23/2014   Procedure: RIGHT CARPAL TUNNEL RELEASE;  Surgeon: Tennis Must, MD;  Location: [facility];  Service: Orthopedics;  Laterality: Right;   COLONOSCOPY  2003, 2015   LEFT AND RIGHT HEART CATHETERIZATION WITH CORONARY ANGIOGRAM N/A 03/11/2013   Procedure: LEFT AND RIGHT HEART CATHETERIZATION WITH CORONARY ANGIOGRAM;  Surgeon: Laverda Page, MD;  Location: [facility] CATH LAB;  Service: Cardiovascular;  Laterality: N/A;   LUMBAR WOUND DEBRIDEMENT N/A 11/07/2013   Procedure: LUMBAR WOUND DEBRIDEMENT;  Surgeon: Winfield Cunas, MD;  Location: [facility] NEURO ORS;  Service: Neurosurgery;  Laterality: N/A;   TONSILLECTOMY  1958       Family History  Problem Relation Age of Onset   Diabetes Mother    Hypertension Mother    Pancreatic cancer Mother    Colon cancer Neg Hx     Social History   Tobacco Use   Smoking status: Former    Packs/day: 0.50    Years: 0.00    Pack years: 0.00    Types: Cigarettes    Quit date: 08/14/1995    Years since quitting: 25.9   Smokeless tobacco: Never  Vaping Use   Vaping Use: Never used  Substance Use Topics   Alcohol use: No    Alcohol/week: 0.0 standard drinks   Drug use: No    Home Medications Prior to Admission medications   Medication Sig Start Date End Date Taking? Authorizing Provider  acetaminophen (TYLENOL) 325 MG tablet Take 325 mg by mouth every 6 (six) hours as needed for mild pain (To take 4 a day on top of Percocet).    [provider]  albuterol (PROVENTIL) (5 MG/ML) 0.5% nebulizer solution  11/03/20   [provider]  Ascorbic Acid (VITAMIN C) 500 MG CAPS Take 1 tablet by mouth in the morning and at bedtime.    [provider]  aspirin EC 81 MG tablet Take 81 mg by mouth daily.    [provider]  cetirizine (ZYRTEC) 10 MG tablet Take 10 mg by mouth daily as needed.     [provider]  Cyanocobalamin (VITAMIN B-12)  3000 MCG SUBL Place 1 tablet under the tongue daily.     [provider]  docusate sodium (COLACE) 100 MG capsule Take 400 mg by mouth daily.     [provider]  ipratropium-albuterol (DUONEB) 0.5-2.5 (3) MG/3ML SOLN Take 3 mLs by nebulization every 4 (four) hours. Patient taking differently: Take 3 mLs by nebulization every 4 (four) hours as needed (Shortness of breath). 06/05/17   Debbe Odea, MD  levothyroxine (SYNTHROID) 150 MCG tablet Take 138 mcg by mouth daily before breakfast.     [provider]  metoprolol succinate (TOPROL-XL) 100 MG 24 hr tablet Take 100 mg by mouth daily.  02/06/18   [provider]  montelukast (SINGULAIR) 10 MG tablet Take 10 mg by mouth at bedtime. 05/07/17   [provider]  Omega-3 Fatty Acids (FISH OIL) 1000 MG CAPS Take 2,000 mg by mouth daily.     [provider]  oxyCODONE (ROXICODONE) 5 MG immediate release tablet Take 1 tablet (5 mg total) by mouth every 4 (four) hours as needed for severe pain. 04/18/15   Kritzer, Louie Casa, MD  predniSONE (DELTASONE) 10 MG tablet  06/03/20   [provider]  valsartan-hydrochlorothiazide (DIOVAN-HCT) 160-25 MG tablet Take 1 tablet by mouth every morning. 06/27/21   Adrian Prows, MD    Allergies    Keflex [cephalexin]  Review of Systems   Review of Systems  Constitutional:  Positive for chills and fatigue. Negative for fever.  HENT:  Negative for facial swelling and trouble swallowing.   Eyes:  Negative for photophobia and visual disturbance.  Respiratory:  Positive for cough and shortness of breath.   Cardiovascular:  Negative for chest pain and palpitations.  Gastrointestinal:  Positive for constipation. Negative for abdominal pain, nausea and vomiting.  Endocrine: Negative for polydipsia and polyuria.  Genitourinary:  Negative for difficulty urinating and hematuria.  Musculoskeletal:  Positive for myalgias. Negative for gait problem and joint swelling.  Skin:   Negative for pallor and rash.  Neurological:  Negative for syncope and headaches.  Psychiatric/Behavioral:  Negative for agitation and confusion.    Physical Exam Updated Vital Signs BP (!) 144/75   Pulse 91   Temp 97.6 F (36.4 C)   Resp 16   Ht 6\' 2"  (1.88 m)   Wt 86.6 kg   SpO2 100%   BMI 24.51 kg/m   Physical Exam Vitals and nursing note reviewed.  Constitutional:      General: Peter [name] is not in acute distress.    Appearance: Peter [name] is well-developed.     Comments: Frail appearing  HENT:     Head: Normocephalic and atraumatic.     Right Ear: External ear normal.     Left Ear: External ear normal.     Mouth/Throat:     Mouth: Mucous membranes are moist.  Eyes:     General: No scleral icterus. Cardiovascular:     Rate and Rhythm: Regular rhythm. Tachycardia present.     Pulses: Normal pulses.     Heart sounds: Normal heart sounds.  Pulmonary:     Effort: Pulmonary effort is normal. No tachypnea, accessory muscle usage or respiratory distress.     Breath sounds: Decreased breath sounds present. No wheezing, rhonchi or rales.  Abdominal:     General: Abdomen is flat.     Palpations: Abdomen is soft.     Tenderness: There is no abdominal tenderness.  Musculoskeletal:        General: Normal range of motion.     Cervical back: Normal range of motion.     Right lower leg: No edema.     Left lower leg: No edema.  Skin:    General: Skin is warm and dry.     Capillary Refill: Capillary refill takes less than 2 seconds.  Neurological:     Mental Status: Peter [name] is alert and oriented to person, place, and time.  Psychiatric:        Mood and Affect: Mood normal.        Behavior: Behavior normal.    ED Results / Procedures / Treatments   Labs (all labs ordered are listed, but only abnormal results are displayed) Labs Reviewed  BASIC METABOLIC PANEL - Abnormal; Notable for the following components:      Result Value   BUN 52 (*)    Creatinine, Ser 2.93 (*)    GFR, Estimated 22  (*)  All other components within normal limits  CBC WITH DIFFERENTIAL/PLATELET - Abnormal; Notable for the following components:   WBC 3.8 (*)    Hemoglobin 12.5 (*)    HCT 38.0 (*)    All other components within normal limits  D-DIMER, QUANTITATIVE - Abnormal; Notable for the following components:   D-Dimer, Quant 1.12 (*)    All other components within normal limits  RESP PANEL BY RT-PCR (FLU A&B, COVID) ARPGX2  EXPECTORATED SPUTUM ASSESSMENT W GRAM STAIN, RFLX TO RESP C  CULTURE, BLOOD (ROUTINE X 2)  CULTURE, BLOOD (ROUTINE X 2)  PROCALCITONIN  TROPONIN I (HIGH SENSITIVITY)    EKG EKG Interpretation  Date/Time:  Monday August 01 2021 16:13:32 EST Ventricular Rate:  122 PR Interval:  148 QRS Duration: 102 QT Interval:  340 QTC Calculation: 484 R Axis:   50 Text Interpretation: Sinus tachycardia Moderate voltage criteria for LVH, may be normal variant ( Sokolow-Lyon , Cornell product ) Similar to prior tracing Confirmed by Wynona Dove ([id number]) on 08/01/2021 11:44:45 PM  Radiology DG Chest 2 View  Result Date: 08/01/2021 CLINICAL DATA:  SOB EXAM: CHEST - 2 VIEW COMPARISON:  07/20/2021 FINDINGS: Minimal patchy density at the lung bases probably reflects residual pneumonia seen on chest CT. No pleural effusion. Cardiomediastinal contours are within normal limits. No acute osseous abnormality. IMPRESSION: Minimal patchy density at lung bases probably reflecting residual pneumonia seen on recent chest CT. Electronically Signed   By: Macy Mis M.D.   On: 08/01/2021 16:44    Procedures .Critical Care Performed by: Jeanell Sparrow, DO Authorized by: Jeanell Sparrow, DO   Critical care provider statement:    Critical care time (minutes):  35   Critical care time was exclusive of:  Separately billable procedures and treating other patients   Critical care was necessary to treat or prevent imminent or life-threatening deterioration of the following conditions:  Sepsis   Critical  care was time spent personally by me on the following activities:  Ordering and performing treatments and interventions, ordering and review of laboratory studies, ordering and review of radiographic studies, pulse oximetry, re-evaluation of patient's condition, review of old charts, obtaining history from patient or surrogate, examination of patient, evaluation of patient's response to treatment and development of treatment plan with patient or surrogate   Care discussed with: admitting provider     Medications Ordered in ED Medications  lactated ringers infusion ( Intravenous New Bag/Given 08/01/21 2330)  aztreonam (AZACTAM) 1 g in sodium chloride 0.9 % 100 mL IVPB (1 g Intravenous New Bag/Given 08/01/21 2329)  vancomycin variable dose per unstable renal function (pharmacist dosing) (has no administration in time range)  aztreonam (AZACTAM) 1 g in sodium chloride 0.9 % 100 mL IVPB (has no administration in time range)  vancomycin (VANCOCIN) 1,500 mg in sodium chloride 0.9 % 500 mL IVPB (has no administration in time range)  ipratropium-albuterol (DUONEB) 0.5-2.5 (3) MG/3ML nebulizer solution 3 mL (3 mLs Nebulization Given 08/01/21 2100)  magnesium sulfate IVPB 2 g 50 mL (0 g Intravenous Stopped 08/01/21 2212)  sodium chloride 0.9 % bolus 1,000 mL (1,000 mLs Intravenous New Bag/Given 08/01/21 2150)    ED Course  I have reviewed the triage vital signs and the nursing notes.  Pertinent labs & imaging results that were available during my care of the patient were reviewed by me and considered in my medical decision making (see chart for details).    MDM Rules/Calculators/A&P  CC: cough, malaise  This patient complains of above; this involves an extensive number of treatment options and is a complaint that carries with it a high risk of complications and morbidity. Vital signs were reviewed. Serious etiologies considered.  Record review:  Previous records obtained and  reviewed   Additional history obtained from spouse  Work up as above, notable for:  Labs & imaging results that were available during my care of the patient were reviewed by me and considered in my medical decision making.   I ordered imaging studies which included CXR and I independently visualized and interpreted imaging which showed pneumonia.   Management: Patient given nebulized breathing treatment, IV fluids, magnesium sulfate.  Status mildly improved.  Reassessment:   Cr worsened from baseline, prior creatinine was 1.93, today 2.93.  Prior creatinine to 2 weeks ago was within normal limits.  Concern for AKI.  Patient also with reduced oral intake over the past few weeks secondary to pneumonia.  Continue IV fluids.  Patient is leukopenic, tachycardic, pneumonia on chest x-ray, worsening renal function.  Concern for severe sepsis.  Feel patient has failed outpatient Levaquin.  Will start vancomycin and aztreonam after discussion with pharmacy given patient's allergies and prior medications.  Recommend admission at this time for ongoing pneumonia, failure of outpatient therapy.  Worsening renal function.  Patient with low risk Wells score, D-dimer was obtained which is elevated.  Patient renal function would not tolerate CT PE.  Recommend IV hydration to see if renal status improves and see patient able to tolerate CT.  Otherwise likely require VQ scan.  Defer to hospitalist regarding further care of this.  D/w Dr Eugenia Pancoast who accepts patient  for admission.         This chart was dictated using voice recognition software.  Despite best efforts to proofread,  errors can occur which can change the documentation meaning.  Final Clinical Impression(s) / ED Diagnoses Final diagnoses:  AKI (acute kidney injury) ([place])  Community acquired pneumonia, unspecified laterality  Mild asthma with exacerbation, unspecified whether persistent  Severe sepsis [facility])    Rx / DC Orders ED  Discharge Orders     None        Jeanell Sparrow, DO 08/01/21 2345

## 2021-08-01 NOTE — Progress Notes (Signed)
Pharmacy Antibiotic Note  Peter [name] is a 70 y.o. male admitted on 08/01/2021 with pneumonia.  Pharmacy has been consulted for Aztreonam and vancomycin dosing in the setting of allergies to cephalosporins.  WBC low, SCr 2.93 (BL ~ 1.9).   Plan: -Start Aztreonam 1 gm IV Q 8 hours -Vancomycin 1500 mg IV load followed by dosing per levels. Monitor for improvement in renal fx -Monitor CBC, renal fx, cultures and clinical progress  Height: 6\' 2"  (188 cm) Weight: 86.6 kg (190 lb 14.7 oz) IBW/kg (Calculated) : 82.2  Temp (24hrs), Avg:97.8 F (36.6 C), Min:97.6 F (36.4 C), Max:98 F (36.7 C)  Recent Labs  Lab 08/01/21 2050  WBC 3.8*  CREATININE 2.93*    Estimated Creatinine Clearance: 27.7 mL/min (A) (by C-G formula based on SCr of 2.93 mg/dL (H)).    Allergies  Allergen Reactions   Keflex [Cephalexin] Anaphylaxis    Antimicrobials this admission: Aztreonam 11/7 >>  Vancomycin 11/7 >>   Dose adjustments this admission:  Microbiology results: 11/7 BCx:   Thank you for allowing pharmacy to be a part of this patient's care.  Albertina Parr, PharmD., BCPS, BCCCP Clinical Pharmacist Please refer to [facility] for unit-specific pharmacist

## 2021-08-02 DIAGNOSIS — M1711 Unilateral primary osteoarthritis, right knee: Secondary | ICD-10-CM | POA: Diagnosis present

## 2021-08-02 DIAGNOSIS — Z87892 Personal history of anaphylaxis: Secondary | ICD-10-CM | POA: Diagnosis not present

## 2021-08-02 DIAGNOSIS — E871 Hypo-osmolality and hyponatremia: Secondary | ICD-10-CM | POA: Diagnosis not present

## 2021-08-02 DIAGNOSIS — Z7982 Long term (current) use of aspirin: Secondary | ICD-10-CM | POA: Diagnosis not present

## 2021-08-02 DIAGNOSIS — K5903 Drug induced constipation: Secondary | ICD-10-CM | POA: Diagnosis present

## 2021-08-02 DIAGNOSIS — E039 Hypothyroidism, unspecified: Secondary | ICD-10-CM | POA: Diagnosis present

## 2021-08-02 DIAGNOSIS — N179 Acute kidney failure, unspecified: Secondary | ICD-10-CM | POA: Diagnosis present

## 2021-08-02 DIAGNOSIS — J189 Pneumonia, unspecified organism: Secondary | ICD-10-CM

## 2021-08-02 DIAGNOSIS — G4733 Obstructive sleep apnea (adult) (pediatric): Secondary | ICD-10-CM

## 2021-08-02 DIAGNOSIS — J45901 Unspecified asthma with (acute) exacerbation: Secondary | ICD-10-CM | POA: Diagnosis present

## 2021-08-02 DIAGNOSIS — D72819 Decreased white blood cell count, unspecified: Secondary | ICD-10-CM | POA: Diagnosis not present

## 2021-08-02 DIAGNOSIS — R042 Hemoptysis: Secondary | ICD-10-CM | POA: Diagnosis present

## 2021-08-02 DIAGNOSIS — D6959 Other secondary thrombocytopenia: Secondary | ICD-10-CM | POA: Diagnosis present

## 2021-08-02 DIAGNOSIS — D649 Anemia, unspecified: Secondary | ICD-10-CM | POA: Diagnosis present

## 2021-08-02 DIAGNOSIS — I5042 Chronic combined systolic (congestive) and diastolic (congestive) heart failure: Secondary | ICD-10-CM | POA: Diagnosis present

## 2021-08-02 DIAGNOSIS — Z981 Arthrodesis status: Secondary | ICD-10-CM | POA: Diagnosis not present

## 2021-08-02 DIAGNOSIS — Z886 Allergy status to analgesic agent status: Secondary | ICD-10-CM | POA: Diagnosis not present

## 2021-08-02 DIAGNOSIS — Z79890 Hormone replacement therapy (postmenopausal): Secondary | ICD-10-CM | POA: Diagnosis not present

## 2021-08-02 DIAGNOSIS — Z87891 Personal history of nicotine dependence: Secondary | ICD-10-CM | POA: Diagnosis not present

## 2021-08-02 DIAGNOSIS — I110 Hypertensive heart disease with heart failure: Secondary | ICD-10-CM | POA: Diagnosis present

## 2021-08-02 DIAGNOSIS — K5730 Diverticulosis of large intestine without perforation or abscess without bleeding: Secondary | ICD-10-CM | POA: Diagnosis present

## 2021-08-02 DIAGNOSIS — T40605A Adverse effect of unspecified narcotics, initial encounter: Secondary | ICD-10-CM | POA: Diagnosis present

## 2021-08-02 DIAGNOSIS — Z79899 Other long term (current) drug therapy: Secondary | ICD-10-CM | POA: Diagnosis not present

## 2021-08-02 DIAGNOSIS — Z20822 Contact with and (suspected) exposure to covid-19: Secondary | ICD-10-CM | POA: Diagnosis present

## 2021-08-02 DIAGNOSIS — Z8614 Personal history of Methicillin resistant Staphylococcus aureus infection: Secondary | ICD-10-CM | POA: Diagnosis not present

## 2021-08-02 LAB — LACTIC ACID, PLASMA
Lactic Acid, Venous: 1.2 mmol/L (ref 0.5–1.9)
Lactic Acid, Venous: 1.1 mmol/L (ref 0.5–1.9)

## 2021-08-02 LAB — MRSA NEXT GEN BY PCR, NASAL: MRSA by PCR Next Gen: NOT DETECTED

## 2021-08-02 LAB — PROCALCITONIN
Procalcitonin: 0.18 ng/mL
Procalcitonin: <0.1 ng/mL

## 2021-08-02 LAB — STREP PNEUMONIAE URINARY ANTIGEN: Strep Pneumo Urinary Antigen: NEGATIVE

## 2021-08-02 MED ORDER — ENOXAPARIN SODIUM 30 MG/0.3ML IJ SOSY
30.0000 mg | PREFILLED_SYRINGE | INTRAMUSCULAR | Status: DC
  Administered 2021-08-02: 30 mg via SUBCUTANEOUS
  Filled 2021-08-02: qty 0.3

## 2021-08-02 MED ORDER — VANCOMYCIN HCL IN DEXTROSE 1-5 GM/200ML-% IV SOLN
1000.0000 mg | Freq: Once | INTRAVENOUS | Status: AC
  Administered 2021-08-02: 1000 mg via INTRAVENOUS
  Filled 2021-08-02: qty 200

## 2021-08-02 MED ORDER — DOCUSATE SODIUM 100 MG PO CAPS: 400.0000 mg | ORAL_CAPSULE | Freq: Every day | ORAL | Status: DC | PRN

## 2021-08-02 MED ORDER — METHYLPREDNISOLONE SODIUM SUCC 40 MG IJ SOLR
40.0000 mg | Freq: Once | INTRAMUSCULAR | Status: AC
  Administered 2021-08-02: 40 mg via INTRAVENOUS
  Filled 2021-08-02: qty 1

## 2021-08-02 MED ORDER — HYDROCHLOROTHIAZIDE 25 MG PO TABS: 25.0000 mg | ORAL_TABLET | Freq: Every day | ORAL | Status: DC

## 2021-08-02 MED ORDER — LEVOTHYROXINE SODIUM 150 MCG PO TABS
150.0000 ug | ORAL_TABLET | Freq: Every day | ORAL | Status: DC
  Administered 2021-08-03 – 2021-08-05 (×3): 150 ug via ORAL
  Filled 2021-08-02 (×3): qty 1

## 2021-08-02 MED ORDER — IPRATROPIUM-ALBUTEROL 0.5-2.5 (3) MG/3ML IN SOLN
3.0000 mL | Freq: Four times a day (QID) | RESPIRATORY_TRACT | Status: DC
Start: 2021-08-02 — End: 2021-08-03
  Administered 2021-08-02 – 2021-08-03 (×2): 3 mL via RESPIRATORY_TRACT
  Filled 2021-08-02 (×2): qty 3

## 2021-08-02 MED ORDER — OXYCODONE HCL 5 MG PO TABS
5.0000 mg | ORAL_TABLET | ORAL | Status: DC | PRN
  Administered 2021-08-02 – 2021-08-04 (×5): 5 mg via ORAL
  Filled 2021-08-02 (×6): qty 1

## 2021-08-02 MED ORDER — ACETAMINOPHEN 325 MG PO TABS
650.0000 mg | ORAL_TABLET | Freq: Four times a day (QID) | ORAL | Status: DC | PRN
  Administered 2021-08-02 – 2021-08-04 (×4): 650 mg via ORAL
  Filled 2021-08-02 (×4): qty 2

## 2021-08-02 MED ORDER — METOPROLOL SUCCINATE ER 50 MG PO TB24
100.0000 mg | ORAL_TABLET | Freq: Every day | ORAL | Status: DC
  Administered 2021-08-02 – 2021-08-05 (×4): 100 mg via ORAL
  Filled 2021-08-02 (×4): qty 2

## 2021-08-02 MED ORDER — PREDNISONE 20 MG PO TABS
40.0000 mg | ORAL_TABLET | Freq: Every day | ORAL | Status: AC
  Administered 2021-08-03 – 2021-08-04 (×2): 40 mg via ORAL
  Filled 2021-08-02 (×2): qty 2

## 2021-08-02 MED ORDER — ALBUTEROL SULFATE (2.5 MG/3ML) 0.083% IN NEBU: 2.5000 mg | INHALATION_SOLUTION | RESPIRATORY_TRACT | Status: DC | PRN

## 2021-08-02 MED ORDER — ASPIRIN EC 81 MG PO TBEC
81.0000 mg | DELAYED_RELEASE_TABLET | Freq: Every day | ORAL | Status: DC
  Administered 2021-08-02 – 2021-08-05 (×4): 81 mg via ORAL
  Filled 2021-08-02 (×4): qty 1

## 2021-08-02 MED ORDER — SENNOSIDES-DOCUSATE SODIUM 8.6-50 MG PO TABS
2.0000 | ORAL_TABLET | Freq: Two times a day (BID) | ORAL | Status: DC
  Administered 2021-08-02 – 2021-08-05 (×6): 2 via ORAL
  Filled 2021-08-02 (×6): qty 2

## 2021-08-02 MED ORDER — ONDANSETRON HCL 4 MG/2ML IJ SOLN: 4.0000 mg | Freq: Four times a day (QID) | INTRAMUSCULAR | Status: DC | PRN

## 2021-08-02 MED ORDER — ASCORBIC ACID 500 MG PO TABS
1000.0000 mg | ORAL_TABLET | Freq: Every day | ORAL | Status: DC
  Administered 2021-08-03 – 2021-08-05 (×3): 1000 mg via ORAL
  Filled 2021-08-02 (×3): qty 2

## 2021-08-02 MED ORDER — ONDANSETRON HCL 4 MG PO TABS: 4.0000 mg | ORAL_TABLET | Freq: Four times a day (QID) | ORAL | Status: DC | PRN

## 2021-08-02 MED ORDER — LACTATED RINGERS IV SOLN: INTRAVENOUS | Status: AC

## 2021-08-02 MED ORDER — IRBESARTAN 150 MG PO TABS: 150.0000 mg | ORAL_TABLET | Freq: Every day | ORAL | Status: DC

## 2021-08-02 MED ORDER — LORATADINE 10 MG PO TABS
10.0000 mg | ORAL_TABLET | Freq: Every day | ORAL | Status: DC
  Administered 2021-08-02 – 2021-08-05 (×3): 10 mg via ORAL
  Filled 2021-08-02 (×4): qty 1

## 2021-08-02 MED ORDER — VALSARTAN-HYDROCHLOROTHIAZIDE 160-25 MG PO TABS: 1.0000 | ORAL_TABLET | ORAL | Status: DC

## 2021-08-02 MED ORDER — MONTELUKAST SODIUM 10 MG PO TABS
10.0000 mg | ORAL_TABLET | Freq: Every day | ORAL | Status: DC
  Administered 2021-08-02 – 2021-08-04 (×3): 10 mg via ORAL
  Filled 2021-08-02 (×3): qty 1

## 2021-08-02 MED ORDER — ACETAMINOPHEN 650 MG RE SUPP: 650.0000 mg | Freq: Four times a day (QID) | RECTAL | Status: DC | PRN

## 2021-08-02 MED ORDER — FUROSEMIDE 20 MG PO TABS: 20.0000 mg | ORAL_TABLET | Freq: Every day | ORAL | Status: DC | PRN

## 2021-08-02 MED ORDER — IPRATROPIUM-ALBUTEROL 0.5-2.5 (3) MG/3ML IN SOLN: 3.0000 mL | RESPIRATORY_TRACT | Status: DC | PRN

## 2021-08-02 NOTE — Sepsis Progress Note (Signed)
Notified provider of need to order lactic acid. ° °

## 2021-08-02 NOTE — ED Notes (Signed)
Spoke with [facility] regarding 2nd bag of Vancomycin with his BUN/Creat elevated. He has verified the pt will receive the 2nd gram of Vancomycin.

## 2021-08-02 NOTE — ED Notes (Signed)
Patient signed Transfer Consent Form Willingly.

## 2021-08-02 NOTE — ED Notes (Signed)
[facility] at Bedside. 

## 2021-08-02 NOTE — ED Notes (Signed)
Care Handoff/Report given to [facility] RN at this Time. All Questions Answered. 

## 2021-08-02 NOTE — H&P (Signed)
History and Physical    Peter [name] PPI:[id number] DOB: [birth year] DOA: 08/01/2021  PCP: Shon Baton, MD   Patient coming from: Home.   I have personally briefly reviewed patient's old medical records in [place]  Chief Complaint: Shortness of breath.  HPI: Peter [name] is a 70 y.o. male with medical history significant of normal CK, history of acute blood loss anemia, chronic combined systolic and diastolic heart failure, osteoarthritis of the right knee, asthma, stage II chronic kidney disease, diverticulosis of the colon, hypertension, hypothyroidism, history of lumbar MRSA infection, OSA on CPAP, substance abuse who came to the emergency department due to progressively worse dyspnea associated with wheezing and mostly of dry cough with occasional sputum production for several days.  He has also had decreased appetite, fatigue, worsening constipation and oliguria.  He denied fever, chills, night sweats.  Rhinorrhea, sore throat, hemoptysis, palpitations, diaphoresis, PND, dyspnea or pitting edema lower extremities.  No nausea, emesis, diarrhea, melena or hematochezia.  No dysuria or hematuria.  No polyuria, polydipsia, polyphagia or blurred vision.  ED Course: Initial vital signs were temperature 98 F, pulse 115, respiration 22, BP 96/82 mmHg O2 sat 96% on room air.  The patient received a 1000 mL LR bolus followed by 1500 mL of LR over 10 hours.  He also received magnesium sulfate 2 g IVPB and a DuoNeb.  He was started on aztreonam and vancomycin per pharmacy.  Lab work: CBC is her white count 3.8, hemoglobin 12.5 g/dL and platelets 176.  D-dimer was 1.12 mcg/mL.  Troponin and lactic acid were normal.  Procalcitonin was 0.118 ng/mL.  BMP showed BUN of 52 and creatinine of 2.93 mg/dL.  The rest of the BMP was normal.  Lab work: 2 view chest radiograph showed minimal patchy density at the lung bases probably reflecting residual pneumonia seen on recent chest radiograph.   Please see images and full radiology report for further details.  Review of Systems: As per HPI otherwise all other systems reviewed and are negative.  Past Medical History:  Diagnosis Date   Abnormal CK 03/11/2013   Acute blood loss anemia 04/11/2016   Acute combined systolic and diastolic heart failure ([place]) 03/11/2013   Allergy    Arthritis    right knee   Asthma    Back pain    Chronic kidney disease    Colon polyps 2003, 2015   2003: hyperplastic. 05/2014: tubular adenoma   Complication of anesthesia    problems urinating after anesthesia   Diverticulosis of colon 2003, 2015   Hypertension    Hypothyroidism    had Graves' disease - thyroid ablation   MRSA (methicillin resistant Staphylococcus aureus) infection    lumbar-2015   Obstructive sleep apnea syndrome 09/22/2014   uses cpap   Opiate use 10/02/2017   S/P radioactive iodine thyroid ablation 06/04/2017   Substance abuse ([place])    not in many years   Wears glasses    Wears partial dentures    top   Past Surgical History:  Procedure Laterality Date   ANTERIOR CERVICAL DECOMP/DISCECTOMY FUSION N/A 07/30/2013   Procedure: CERVICAL FIVE,CERVICAL SIX CORPECTOMY,ANTERIOR ARTHRODESIS,PEEK INTERBODY GRAFT CERVICAL FOUR-CERVICAL SEVEN,ANTERIOR INSTRUMENTATION;  Surgeon: Winfield Cunas, MD;  Location: [facility];  Service: Neurosurgery;  Laterality: N/A;   ANTERIOR CERVICAL DECOMP/DISCECTOMY FUSION N/A 04/16/2015   Procedure: CERVICAL THREE-FOUR ANTERIOR CERVICAL DECOMPRESSION/DISCECTOMY FUSION 1 LEVEL;  Surgeon: Ashok Pall, MD;  Location: [facility];  Service: Neurosurgery;  Laterality: N/A;  C34 anterior cervical  decompression with fusion plating and bonegraft   BACK SURGERY  1/15   lumb fusion   CARDIAC CATHETERIZATION  2014   CARPAL TUNNEL RELEASE Right 03/23/2014   Procedure: RIGHT CARPAL TUNNEL RELEASE;  Surgeon: Tennis Must, MD;  Location: [facility];  Service: Orthopedics;  Laterality: Right;    COLONOSCOPY  2003, 2015   LEFT AND RIGHT HEART CATHETERIZATION WITH CORONARY ANGIOGRAM N/A 03/11/2013   Procedure: LEFT AND RIGHT HEART CATHETERIZATION WITH CORONARY ANGIOGRAM;  Surgeon: Laverda Page, MD;  Location: [facility] CATH LAB;  Service: Cardiovascular;  Laterality: N/A;   LUMBAR WOUND DEBRIDEMENT N/A 11/07/2013   Procedure: LUMBAR WOUND DEBRIDEMENT;  Surgeon: Winfield Cunas, MD;  Location: [facility];  Service: Neurosurgery;  Laterality: N/A;   TONSILLECTOMY  1958   Social History  reports that he quit smoking about 25 years ago. His smoking use included cigarettes. He smoked an average of 0.50 packs per day. He has never used smokeless tobacco. He reports that he does not drink alcohol and does not use drugs.  Allergies  Allergen Reactions   Keflex [Cephalexin] Anaphylaxis   Family History  Problem Relation Age of Onset   Diabetes Mother    Hypertension Mother    Pancreatic cancer Mother    Colon cancer Neg Hx    Prior to Admission medications   Medication Sig Start Date End Date Taking? Authorizing Provider  acetaminophen (TYLENOL) 325 MG tablet Take 325 mg by mouth every 6 (six) hours as needed for mild pain (To take 4 a day on top of Percocet).    [provider]  albuterol (PROVENTIL) (5 MG/ML) 0.5% nebulizer solution  11/03/20   [provider]  Ascorbic Acid (VITAMIN C) 500 MG CAPS Take 1 tablet by mouth in the morning and at bedtime.    [provider]  aspirin EC 81 MG tablet Take 81 mg by mouth daily.    [provider]  cetirizine (ZYRTEC) 10 MG tablet Take 10 mg by mouth daily as needed.     [provider]  Cyanocobalamin (VITAMIN B-12) 3000 MCG SUBL Place 1 tablet under the tongue daily.     [provider]  docusate sodium (COLACE) 100 MG capsule Take 400 mg by mouth daily.     [provider]  ipratropium-albuterol (DUONEB) 0.5-2.5 (3) MG/3ML SOLN Take 3 mLs by nebulization every 4 (four) hours. Patient  taking differently: Take 3 mLs by nebulization every 4 (four) hours as needed (Shortness of breath). 06/05/17   Debbe Odea, MD  levothyroxine (SYNTHROID) 150 MCG tablet Take 138 mcg by mouth daily before breakfast.     [provider]  metoprolol succinate (TOPROL-XL) 100 MG 24 hr tablet Take 100 mg by mouth daily.  02/06/18   [provider]  montelukast (SINGULAIR) 10 MG tablet Take 10 mg by mouth at bedtime. 05/07/17   [provider]  Omega-3 Fatty Acids (FISH OIL) 1000 MG CAPS Take 2,000 mg by mouth daily.     [provider]  oxyCODONE (ROXICODONE) 5 MG immediate release tablet Take 1 tablet (5 mg total) by mouth every 4 (four) hours as needed for severe pain. 04/18/15   Kritzer, Louie Casa, MD  valsartan-hydrochlorothiazide (DIOVAN-HCT) 160-25 MG tablet Take 1 tablet by mouth every morning. 06/27/21   Adrian Prows, MD   Physical Exam: Vitals:   08/02/21 0946 08/02/21 1000 08/02/21 1224 08/02/21 1245  BP: (!) 159/104 (!) 135/106  (!) 145/106  Pulse: 96 86  80  Resp: 18 18  20   Temp:   97.7 F (36.5 C)   TempSrc:   Oral   SpO2: 98% 98%  99%  Weight:      Height:       Constitutional: NAD, calm, comfortable Eyes: PERRL, lids and conjunctivae normal ENMT: Mucous membranes are dry.. Posterior pharynx clear of any exudate or lesions Neck: normal, supple, no masses, no thyromegaly Respiratory: Decreased breath sounds with bilateral wheezing and scattered crackles. Normal respiratory effort. No accessory muscle use.  Cardiovascular: Regular rate and rhythm, no murmurs / rubs / gallops. No extremity edema. 2+ pedal pulses. No carotid bruits.  Abdomen: No distention.  Bowel sounds positive.  Soft, no tenderness, no masses palpated. No hepatosplenomegaly. Musculoskeletal: no clubbing / cyanosis. Good ROM, no contractures. Normal muscle tone.  Skin: no rashes, lesions, ulcers on very limited dermatological examination. Neurologic: CN 2-12 grossly intact. Sensation  intact, DTR normal. Strength 5/5 in all 4.  Psychiatric: Normal judgment and insight. Alert and oriented x 3. Normal mood.    Labs on Admission: I have personally reviewed following labs and imaging studies  CBC: Recent Labs  Lab 08/01/21 2050  WBC 3.8*  NEUTROABS 2.2  HGB 12.5*  HCT 38.0*  MCV 85.6  PLT 734    Basic Metabolic Panel: Recent Labs  Lab 08/01/21 2050  NA 135  K 4.2  CL 99  CO2 23  GLUCOSE 77  BUN 52*  CREATININE 2.93*  CALCIUM 9.6    GFR: Estimated Creatinine Clearance: 27.7 mL/min (A) (by C-G formula based on SCr of 2.93 mg/dL (H)).  Liver Function Tests: No results for input(s): AST, ALT, ALKPHOS, BILITOT, PROT, ALBUMIN in the last 168 hours.  Radiological Exams on Admission: DG Chest 2 View  Result Date: 08/01/2021 CLINICAL DATA:  SOB EXAM: CHEST - 2 VIEW COMPARISON:  07/20/2021 FINDINGS: Minimal patchy density at the lung bases probably reflects residual pneumonia seen on chest CT. No pleural effusion. Cardiomediastinal contours are within normal limits. No acute osseous abnormality. IMPRESSION: Minimal patchy density at lung bases probably reflecting residual pneumonia seen on recent chest CT. Electronically Signed   By: Macy Mis M.D.   On: 08/01/2021 16:44    Echocardiogram 07/05/2021:  Left ventricle cavity is normal in size. Moderate concentric hypertrophy  of the left ventricle. Normal global wall motion. Normal LV systolic  function with EF 55%. Doppler evidence of grade I (impaired) diastolic  dysfunction, normal LAP.  Moderate (Grade II) mitral regurgitation.  Mild to moderate tricuspid regurgitation. Peak RA-RV gradient 19 mmHg. IVC  is not seen.  Mild pulmonic regurgitation.  The aortic root is upper limit normal at 3.8 cm.  Previous study on 06/21/2020 measured aortic root at 4.2 cm, and estimated  PASP at 34 mmHg.  Consider CT imaging of aorta, if clinically indicated.  EKG: Independently reviewed.  Vent. rate 122 BPM PR  interval 148 ms QRS duration 102 ms QT/QTcB 340/484 ms P-R-T axes 70 50 89 Sinus tachycardia Moderate voltage criteria for LVH, may be normal variant Nonspecific ST and T wave abnormality Abnormal E  Assessment/Plan Principal Problem:   CAP (community acquired pneumonia) Associated with asthma exacerbation Admit to telemetry/inpatient. Supplemental oxygen as needed. DuoNeb 4 times daily. Albuterol neb every 4 hours as needed. Solu-Medrol 40 mg IVP x1. Begin prednisone taper in AM. Continue IV antibiotics. Recheck procalcitonin.  Active Problems:   AKI (acute kidney injury) ([place]) Urine output has 3 parent. Continue IV fluids. Encourage p.o. fluids. Monitor  intake and output.. Follow-up renal function electrolytes. Hold diuretics and ARB.    HTN (hypertension) Hold diuretics and ARB. As needed oral antihypertensive. Monitor BP, renal function electrolytes.    Hypothyroidism Continue levothyroxine 150 mcg p.o. daily.    Obstructive sleep apnea syndrome Continue CPAP at bedtime.    Congestive heart failure, NYHA class 3, chronic, combined ([place]) No signs of decompensation. His EF has improved on last echo.    Normocytic anemia Monitor hematocrit and hemoglobin.    Leukocytopenia Monitor leukocytes.   DVT prophylaxis: Lovenox SQ. Code Status:   Full code. Family Communication:   Disposition Plan:   Patient is from:  Home.  Anticipated DC to:  Home.  Anticipated DC date:  08/03/2021 or 08/04/2021.  Anticipated DC barriers: Clinical status.  Consults called:   Admission status:  Observation/telemetry.   Severity of Illness: High severity after presenting with shortness of breath and dyspnea in the setting of community-acquired pneumonia and acute kidney injury.  The patient will remain in the hospital to continue IV antibiotics and IV hydration.  Reubin Milan MD [facility]  How to contact the [facility] Attending or Consulting provider [place] or  covering provider during after hours [place], for this patient?   Check the care team in [facility] and look for a) attending/consulting TR[facility] provider listed and b) the [facility] team listed Log into [email or web address] and use [place]'s universal password to access. If you do not have the password, please contact the hospital operator. Locate the [facility] provider you are looking for under [facility] and page to a number that you can be directly reached. If you still have difficulty reaching the provider, please page the [facility] (Director on Call) for the Hospitalists listed on amion for assistance.  08/02/2021, 2:42 PM   This document was prepared using Dragon voice recognition software and may contain some unintended transcription errors.

## 2021-08-02 NOTE — ED Notes (Signed)
Care Handoff/Patient Report given to Tiffany at [facility] at this Time.

## 2021-08-02 NOTE — ED Notes (Signed)
Spoke with pharmacy regarding the dose of Vancomycin with his renal function elevated.

## 2021-08-02 NOTE — ED Notes (Signed)
Notified pharmacy to obtain missing dose of Vancomycin due last night at 2300.

## 2021-08-03 LAB — CBC
HCT: 32.9 % — ABNORMAL LOW (ref 39.0–52.0)
Hemoglobin: 11 g/dL — ABNORMAL LOW (ref 13.0–17.0)
MCH: 28.9 pg (ref 26.0–34.0)
MCHC: 33.4 g/dL (ref 30.0–36.0)
MCV: 86.4 fL (ref 80.0–100.0)
Platelets: 144 10*3/uL — ABNORMAL LOW (ref 150–400)
RBC: 3.81 MIL/uL — ABNORMAL LOW (ref 4.22–5.81)
RDW: 12.8 % (ref 11.5–15.5)
WBC: 2.8 10*3/uL — ABNORMAL LOW (ref 4.0–10.5)
nRBC: 0 % (ref 0.0–0.2)

## 2021-08-03 LAB — COMPREHENSIVE METABOLIC PANEL
ALT: 15 U/L (ref 0–44)
AST: 23 U/L (ref 15–41)
Albumin: 3.7 g/dL (ref 3.5–5.0)
Alkaline Phosphatase: 70 U/L (ref 38–126)
Anion gap: 8 (ref 5–15)
BUN: 39 mg/dL — ABNORMAL HIGH (ref 8–23)
CO2: 20 mmol/L — ABNORMAL LOW (ref 22–32)
Calcium: 8.7 mg/dL — ABNORMAL LOW (ref 8.9–10.3)
Chloride: 104 mmol/L (ref 98–111)
Creatinine, Ser: 1.88 mg/dL — ABNORMAL HIGH (ref 0.61–1.24)
GFR, Estimated: 38 mL/min — ABNORMAL LOW (ref >60)
Glucose, Bld: 117 mg/dL — ABNORMAL HIGH (ref 70–99)
Potassium: 4.5 mmol/L (ref 3.5–5.1)
Sodium: 132 mmol/L — ABNORMAL LOW (ref 135–145)
Total Bilirubin: 0.7 mg/dL (ref 0.3–1.2)
Total Protein: 6.7 g/dL (ref 6.5–8.1)

## 2021-08-03 LAB — HIV ANTIBODY (ROUTINE TESTING W REFLEX): HIV Screen 4th Generation wRfx: NONREACTIVE

## 2021-08-03 MED ORDER — SODIUM CHLORIDE 0.9 % IV SOLN
100.0000 mg | Freq: Two times a day (BID) | INTRAVENOUS | Status: DC
  Administered 2021-08-03 – 2021-08-05 (×4): 100 mg via INTRAVENOUS
  Filled 2021-08-03 (×5): qty 100

## 2021-08-03 MED ORDER — ENOXAPARIN SODIUM 40 MG/0.4ML IJ SOSY
40.0000 mg | PREFILLED_SYRINGE | INTRAMUSCULAR | Status: DC
  Administered 2021-08-03 – 2021-08-04 (×2): 40 mg via SUBCUTANEOUS
  Filled 2021-08-03 (×2): qty 0.4

## 2021-08-03 MED ORDER — IPRATROPIUM-ALBUTEROL 0.5-2.5 (3) MG/3ML IN SOLN
3.0000 mL | Freq: Two times a day (BID) | RESPIRATORY_TRACT | Status: DC
Start: 2021-08-03 — End: 2021-08-05
  Administered 2021-08-03 – 2021-08-05 (×4): 3 mL via RESPIRATORY_TRACT
  Filled 2021-08-03 (×4): qty 3

## 2021-08-03 NOTE — Progress Notes (Signed)
PROGRESS NOTE  Peter [name] HEN:[id number] DOB: 1951/05/11 DOA: 08/01/2021 PCP: Shon Baton, MD   LOS: 1 day   Brief Narrative / Interim history: 70 year old male with history of chronic combined CHF, asthma, hypothyroidism, HTN, history of lumbar MRSA infection, OSA on CPAP who comes to the hospital with shortness of breath and wheezing as well as productive cough.  Reports poor p.o. intake, decreased appetite, fatigue.  No fever or chills.  He was recently evaluated about 10 days ago as an outpatient, was diagnosed with multifocal pneumonia and was given a course of Levaquin.  He says that he felt a little bit better while on Antibiotics but as soon as he finished them started feeling worsening and came back to the hospital.  Chest x-ray this admission showed patchy densities at the lung bases reflecting residual pneumonia  Subjective / 24h Interval events: Feeling little bit better today than he was yesterday.  Assessment & Plan: Principal Problem Community-acquired pneumonia, asthma exacerbation-patient has been placed on steroids, continue.  Received few days of Levaquin as an outpatient was rapidly relapsed when antibiotics were discontinued.  He may have had adequate treatment and asthma exacerbation may be post pneumonia but would favor to treat for few more days of antibiotics, placed on doxycycline  Active Problems Acute kidney injury -he has no history of chronic kidney disease.  Patient baseline creatinine is normal, last checked was September 2021 and it was 0.8.  When he came to the ED on 10/26 it was up to 1.9, and during this admission was 2.9.  Improving with fluids, 1.8 this morning, continue IVF  HTN (hypertension) -Hold diuretics and ARB.   Hypothyroidism -Continue levothyroxine 150 mcg p.o. daily.   Obstructive sleep apnea syndrome -Continue CPAP at bedtime.   Congestive heart failure, NYHA class 3, chronic, combined ([place]) -No signs of decompensation. His EF has  improved on last echo, most recently in October 2022 showed EF 55% with grade 1 diastolic dysfunction   Normocytic anemia -Monitor hematocrit and hemoglobin.   Leukocytopenia -Monitor leukocytes.  Hyponatremia-monitor  Leukopenia, anemia, thrombocytopenia - possibly due to infectious process. Monitor  Scheduled Meds:  ascorbic acid  1,000 mg Oral Daily   aspirin EC  81 mg Oral Daily   enoxaparin (LOVENOX) injection  40 mg Subcutaneous Q24H   ipratropium-albuterol  3 mL Nebulization QID   levothyroxine  150 mcg Oral Q0600   loratadine  10 mg Oral Daily   metoprolol succinate  100 mg Oral Daily   montelukast  10 mg Oral QHS   predniSONE  40 mg Oral Q breakfast   senna-docusate  2 tablet Oral BID   Continuous Infusions:  doxycycline (VIBRAMYCIN) IV     lactated ringers 88 mL/hr at 08/02/21 2343   PRN Meds:.acetaminophen **OR** acetaminophen, albuterol, ondansetron **OR** ondansetron (ZOFRAN) IV, oxyCODONE  Diet Orders (From admission, onward)     Start     Ordered   08/02/21 1648  Diet Heart Room service appropriate? Yes; Fluid consistency: Thin  Diet effective now       Question Answer Comment  Room service appropriate? Yes   Fluid consistency: Thin      08/02/21 1647           DVT prophylaxis: enoxaparin (LOVENOX) injection 40 mg Start: 08/03/21 1800    Code Status: Full Code  Family Communication: No family at bedside  Status is: Inpatient  Remains inpatient appropriate because: Persistent AKI, shortness of breath.  Level of care: Telemetry  Consultants:  none  Procedures:  none  Microbiology  none  Antimicrobials: Doxycycline 11/9 >>    Objective: Vitals:   08/02/21 1949 08/02/21 2227 08/03/21 0243 08/03/21 1110  BP:  (!) 132/92 (!) 141/99   Pulse:  94 72   Resp:  18 18   Temp:  98.7 F (37.1 C) 98.1 F (36.7 C)   TempSrc:  Oral Oral   SpO2: 96% 100% 99% 99%  Weight:      Height:        Intake/Output Summary (Last 24 hours) at  08/03/2021 1328 Last data filed at 08/03/2021 1218 Gross per 24 hour  Intake 1108.02 ml  Output 525 ml  Net 583.02 ml   Filed Weights   08/01/21 1617  Weight: 86.6 kg    Examination:  Constitutional: NAD Eyes: no scleral icterus ENMT: Mucous membranes are dry.  Neck: normal, supple Respiratory: faint end expiratory wheezing, no crackles, moves air well Cardiovascular: Regular rate and rhythm, no murmurs / rubs / gallops.  No peripheral edema Abdomen: non distended, no tenderness. Bowel sounds positive.  Musculoskeletal: no clubbing / cyanosis.  Skin: no rashes Neurologic: No focal deficits  Data Reviewed: I have independently reviewed following labs and imaging studies   CBC: Recent Labs  Lab 08/01/21 2050 08/03/21 0511  WBC 3.8* 2.8*  NEUTROABS 2.2  --   HGB 12.5* 11.0*  HCT 38.0* 32.9*  MCV 85.6 86.4  PLT 176 283*   Basic Metabolic Panel: Recent Labs  Lab 08/01/21 2050 08/03/21 0511  NA 135 132*  K 4.2 4.5  CL 99 104  CO2 23 20*  GLUCOSE 77 117*  BUN 52* 39*  CREATININE 2.93* 1.88*  CALCIUM 9.6 8.7*   Liver Function Tests: Recent Labs  Lab 08/03/21 0511  AST 23  ALT 15  ALKPHOS 70  BILITOT 0.7  PROT 6.7  ALBUMIN 3.7   Coagulation Profile: No results for input(s): INR, PROTIME in the last 168 hours. HbA1C: No results for input(s): HGBA1C in the last 72 hours. CBG: No results for input(s): GLUCAP in the last 168 hours.  Recent Results (from the past 240 hour(s))  Resp Panel by RT-PCR (Flu A&B, Covid) Nasopharyngeal Swab     Status: None   Collection Time: 08/01/21  8:50 PM   Specimen: Nasopharyngeal Swab; Nasopharyngeal(NP) swabs in vial transport medium  Result Value Ref Range Status   SARS Coronavirus 2 by RT PCR NEGATIVE NEGATIVE Final    Comment: (NOTE) SARS-CoV-2 target nucleic acids are NOT DETECTED.  The SARS-CoV-2 RNA is generally detectable in upper respiratory specimens during the acute phase of infection. The  lowest concentration of SARS-CoV-2 viral copies this assay can detect is 138 copies/mL. A negative result does not preclude SARS-Cov-2 infection and should not be used as the sole basis for treatment or other patient management decisions. A negative result may occur with  improper specimen collection/handling, submission of specimen other than nasopharyngeal swab, presence of viral mutation(s) within the areas targeted by this assay, and inadequate number of viral copies(<138 copies/mL). A negative result must be combined with clinical observations, patient history, and epidemiological information. The expected result is Negative.  Fact Sheet for Patients:  EntrepreneurPulse.com.au  Fact Sheet for Healthcare Providers:  IncredibleEmployment.be  This test is no t yet approved or cleared by the [place] FDA and  has been authorized for detection and/or diagnosis of SARS-CoV-2 by FDA under an Emergency Use Authorization (EUA). This EUA will remain  in effect (meaning this test can be used)  for the duration of the COVID-19 declaration under Section 564(b)(1) of the Act, 21 U.S.C.section 360bbb-3(b)(1), unless the authorization is terminated  or revoked sooner.       Influenza A by PCR NEGATIVE NEGATIVE Final   Influenza B by PCR NEGATIVE NEGATIVE Final    Comment: (NOTE) The Xpert Xpress SARS-CoV-2/FLU/RSV plus assay is intended as an aid in the diagnosis of influenza from Nasopharyngeal swab specimens and should not be used as a sole basis for treatment. Nasal washings and aspirates are unacceptable for Xpert Xpress SARS-CoV-2/FLU/RSV testing.  Fact Sheet for Patients: EntrepreneurPulse.com.au  Fact Sheet for Healthcare Providers: IncredibleEmployment.be  This test is not yet approved or cleared by the [place] FDA and has been authorized for detection and/or diagnosis of SARS-CoV-2 by FDA under  an Emergency Use Authorization (EUA). This EUA will remain in effect (meaning this test can be used) for the duration of the COVID-19 declaration under Section 564(b)(1) of the Act, 21 U.S.C. section 360bbb-3(b)(1), unless the authorization is terminated or revoked.  Performed at [facility], [address] [place], [place] [address]   Blood culture (routine x 2)     Status: None (Preliminary result)   Collection Time: 08/01/21 11:15 PM   Specimen: BLOOD  Result Value Ref Range Status   Specimen Description   Final    BLOOD Blood Culture results may not be optimal due to an inadequate volume of blood received in culture bottles Performed at [facility], [address], [place], [place] [address]    Special Requests   Final    RIGHT ANTECUBITAL Performed at [facility], [address] [place], [place] [address]    Culture   Final    NO GROWTH < 24 HOURS Performed at [facility], [place] [address], [place], [place] [address]    Report Status PENDING  Incomplete  Blood culture (routine x 2)     Status: None (Preliminary result)   Collection Time: 08/02/21  2:27 PM   Specimen: Right Antecubital; Blood  Result Value Ref Range Status   Specimen Description   Final    RIGHT ANTECUBITAL Performed at [facility] [address], [place], [place] [address]    Special Requests   Final    BOTTLES DRAWN AEROBIC ONLY Blood Culture adequate volume Performed at [facility] [address] [place], [place] [address]    Culture   Final    NO GROWTH < 12 HOURS Performed at [facility] [address] [place], [place] [address]    Report Status PENDING  Incomplete  MRSA Next Gen by PCR, Nasal     Status: None   Collection Time: 08/02/21  3:58 PM   Specimen: Nasal Mucosa; Nasal Swab  Result Value Ref Range Status   MRSA by PCR Next Gen NOT DETECTED NOT DETECTED Final     Comment: (NOTE) The GeneXpert MRSA Assay (FDA approved for NASAL specimens only), is one component of a comprehensive MRSA colonization surveillance program. It is not intended to diagnose MRSA infection nor to guide or monitor treatment for MRSA infections. Test performance is not FDA approved in patients less than 8 years old. Performed at [facility], [place] [address] [place], [place] [address]      Radiology Studies: No results found.  Marzetta Board, MD, PhD [facility]  Between 7 am - 7 pm I am available, please contact me via Amion (for emergencies) or  Securechat (non urgent messages)  Between 7 pm - 7 am I am not available, please contact night coverage MD/APP via Amion

## 2021-08-03 NOTE — Progress Notes (Signed)
Patient continues to decline nocturnal CPAP.  

## 2021-08-04 LAB — CBC
HCT: 32.4 % — ABNORMAL LOW (ref 39.0–52.0)
Hemoglobin: 10.9 g/dL — ABNORMAL LOW (ref 13.0–17.0)
MCH: 28.8 pg (ref 26.0–34.0)
MCHC: 33.6 g/dL (ref 30.0–36.0)
MCV: 85.5 fL (ref 80.0–100.0)
Platelets: 143 10*3/uL — ABNORMAL LOW (ref 150–400)
RBC: 3.79 MIL/uL — ABNORMAL LOW (ref 4.22–5.81)
RDW: 12.8 % (ref 11.5–15.5)
WBC: 3 10*3/uL — ABNORMAL LOW (ref 4.0–10.5)
nRBC: 0 % (ref 0.0–0.2)

## 2021-08-04 LAB — COMPREHENSIVE METABOLIC PANEL
ALT: 16 U/L (ref 0–44)
AST: 23 U/L (ref 15–41)
Albumin: 3.6 g/dL (ref 3.5–5.0)
Alkaline Phosphatase: 65 U/L (ref 38–126)
Anion gap: 7 (ref 5–15)
BUN: 42 mg/dL — ABNORMAL HIGH (ref 8–23)
CO2: 22 mmol/L (ref 22–32)
Calcium: 9 mg/dL (ref 8.9–10.3)
Chloride: 104 mmol/L (ref 98–111)
Creatinine, Ser: 1.61 mg/dL — ABNORMAL HIGH (ref 0.61–1.24)
GFR, Estimated: 46 mL/min — ABNORMAL LOW (ref >60)
Glucose, Bld: 85 mg/dL (ref 70–99)
Potassium: 4 mmol/L (ref 3.5–5.1)
Sodium: 133 mmol/L — ABNORMAL LOW (ref 135–145)
Total Bilirubin: 0.9 mg/dL (ref 0.3–1.2)
Total Protein: 6.6 g/dL (ref 6.5–8.1)

## 2021-08-04 MED ORDER — SODIUM CHLORIDE 0.9 % IV SOLN: INTRAVENOUS | Status: AC

## 2021-08-04 NOTE — Progress Notes (Signed)
[facility] PROGRESS NOTE  Khylan Sawyer ZOX:[id number] DOB: [birth year] DOA: 08/01/2021 PCP: Shon Baton, MD   LOS: 2 days   Brief Narrative / Interim history: 70 year old male with history of chronic combined CHF, asthma, hypothyroidism, HTN, history of lumbar MRSA infection, OSA on CPAP who comes to the hospital with shortness of breath and wheezing as well as productive cough.  Reports poor p.o. intake, decreased appetite, fatigue.  No fever or chills.  He was recently evaluated about 10 days ago as an outpatient, was diagnosed with multifocal pneumonia and was given a course of Levaquin.  He says that he felt a little bit better while on Antibiotics but as soon as he finished them started feeling worsening and came back to the hospital.  Chest x-ray this admission showed patchy densities at the lung bases reflecting residual pneumonia  Subjective / 24h Interval events: Was feeling good yesterday morning but as the day progressed he was feeling worse and worse, also frustrated that his IV functional was non functional and had to be changed  Assessment & Plan: Principal Problem Community-acquired pneumonia, asthma exacerbation-patient has been placed on steroids, continue.  Received few days of Levaquin as an outpatient was rapidly relapsed when antibiotics were discontinued.  He may have had adequate treatment and asthma exacerbation may be post pneumonia but would favor to treat for few more days of antibiotics, placed on doxycycline, continue  Active Problems Acute kidney injury -he has no history of chronic kidney disease.  Patient baseline creatinine is normal, last checked was September 2021 and it was 0.8.  When he came to the ED on 10/26 it was up to 1.9, and during this admission was 2.9.  Improving, 1.6 this morning.  Keep on fluids till tomorrow  HTN (hypertension) -Hold diuretics and ARB.  Potentially resume diuretics on discharge and hold ARB but would not resume both    Hypothyroidism -Continue levothyroxine 150 mcg p.o. daily.   Obstructive sleep apnea syndrome -Continue CPAP at bedtime.   Congestive heart failure, NYHA class 3, chronic, combined ([place]) -No signs of decompensation. His EF has improved on last echo, most recently in October 2022 showed EF 55% with grade 1 diastolic dysfunction.  Appears euvolemic   Normocytic anemia -Monitor hematocrit and hemoglobin.   Leukocytopenia -Monitor leukocytes.  Stable  Hyponatremia-monitor  Leukopenia, anemia, thrombocytopenia - possibly due to infectious process. Monitor  Scheduled Meds:  ascorbic acid  1,000 mg Oral Daily   aspirin EC  81 mg Oral Daily   enoxaparin (LOVENOX) injection  40 mg Subcutaneous Q24H   ipratropium-albuterol  3 mL Nebulization BID   levothyroxine  150 mcg Oral Q0600   loratadine  10 mg Oral Daily   metoprolol succinate  100 mg Oral Daily   montelukast  10 mg Oral QHS   senna-docusate  2 tablet Oral BID   Continuous Infusions:  doxycycline (VIBRAMYCIN) IV Stopped (08/04/21 0657)   PRN Meds:.acetaminophen **OR** acetaminophen, albuterol, ondansetron **OR** ondansetron (ZOFRAN) IV, oxyCODONE  Diet Orders (From admission, onward)     Start     Ordered   08/02/21 1648  Diet Heart Room service appropriate? Yes; Fluid consistency: Thin  Diet effective now       Question Answer Comment  Room service appropriate? Yes   Fluid consistency: Thin      08/02/21 1647           DVT prophylaxis: enoxaparin (LOVENOX) injection 40 mg Start: 08/03/21 1800    Code Status: Full Code  Family Communication:  No family at bedside  Status is: Inpatient  Remains inpatient appropriate because: Persistent AKI, shortness of breath.  Level of care: Telemetry  Consultants:  none  Procedures:  none  Microbiology  none  Antimicrobials: Doxycycline 11/9 >>    Objective: Vitals:   08/03/21 1427 08/03/21 2114 08/04/21 0456 08/04/21 0842  BP: (!) 149/107 (!) 139/98 (!) 151/99  136/89  Pulse: (!) 108 75 69 72  Resp: 20 20 18 16   Temp: 98.1 F (36.7 C) 98.5 F (36.9 C) 97.6 F (36.4 C) 97.8 F (36.6 C)  TempSrc:  Oral Oral Oral  SpO2:  100% 99%   Weight:      Height:        Intake/Output Summary (Last 24 hours) at 08/04/2021 1124 Last data filed at 08/04/2021 1012 Gross per 24 hour  Intake 1897.72 ml  Output 500 ml  Net 1397.72 ml    Filed Weights   08/01/21 1617  Weight: 86.6 kg    Examination:  Constitutional: No distress Eyes: Anicteric ENMT: mmm Neck: normal, supple Respiratory: No wheezing, no crackles, moves air well Cardiovascular: Regular rate and rhythm, no murmurs, no edema Abdomen: Soft, NT, ND, bowel sounds positive Musculoskeletal: no clubbing / cyanosis.  Skin: No rashes seen Neurologic: Nonfocal, equal strength  Data Reviewed: I have independently reviewed following labs and imaging studies   CBC: Recent Labs  Lab 08/01/21 2050 08/03/21 0511 08/04/21 0513  WBC 3.8* 2.8* 3.0*  NEUTROABS 2.2  --   --   HGB 12.5* 11.0* 10.9*  HCT 38.0* 32.9* 32.4*  MCV 85.6 86.4 85.5  PLT 176 144* 143*    Basic Metabolic Panel: Recent Labs  Lab 08/01/21 2050 08/03/21 0511 08/04/21 0513  NA 135 132* 133*  K 4.2 4.5 4.0  CL 99 104 104  CO2 23 20* 22  GLUCOSE 77 117* 85  BUN 52* 39* 42*  CREATININE 2.93* 1.88* 1.61*  CALCIUM 9.6 8.7* 9.0    Liver Function Tests: Recent Labs  Lab 08/03/21 0511 08/04/21 0513  AST 23 23  ALT 15 16  ALKPHOS 70 65  BILITOT 0.7 0.9  PROT 6.7 6.6  ALBUMIN 3.7 3.6    Coagulation Profile: No results for input(s): INR, PROTIME in the last 168 hours. HbA1C: No results for input(s): HGBA1C in the last 72 hours. CBG: No results for input(s): GLUCAP in the last 168 hours.  Recent Results (from the past 240 hour(s))  Resp Panel by RT-PCR (Flu A&B, Covid) Nasopharyngeal Swab     Status: None   Collection Time: 08/01/21  8:50 PM   Specimen: Nasopharyngeal Swab; Nasopharyngeal(NP) swabs in  vial transport medium  Result Value Ref Range Status   SARS Coronavirus 2 by RT PCR NEGATIVE NEGATIVE Final    Comment: (NOTE) SARS-CoV-2 target nucleic acids are NOT DETECTED.  The SARS-CoV-2 RNA is generally detectable in upper respiratory specimens during the acute phase of infection. The lowest concentration of SARS-CoV-2 viral copies this assay can detect is 138 copies/mL. A negative result does not preclude SARS-Cov-2 infection and should not be used as the sole basis for treatment or other patient management decisions. A negative result may occur with  improper specimen collection/handling, submission of specimen other than nasopharyngeal swab, presence of viral mutation(s) within the areas targeted by this assay, and inadequate number of viral copies(<138 copies/mL). A negative result must be combined with clinical observations, patient history, and epidemiological information. The expected result is Negative.  Fact Sheet for Patients:  EntrepreneurPulse.com.au  Fact Sheet for Healthcare Providers:  IncredibleEmployment.be  This test is no t yet approved or cleared by the [place] FDA and  has been authorized for detection and/or diagnosis of SARS-CoV-2 by FDA under an Emergency Use Authorization (EUA). This EUA will remain  in effect (meaning this test can be used) for the duration of the COVID-19 declaration under Section 564(b)(1) of the Act, 21 U.S.C.section 360bbb-3(b)(1), unless the authorization is terminated  or revoked sooner.       Influenza A by PCR NEGATIVE NEGATIVE Final   Influenza B by PCR NEGATIVE NEGATIVE Final    Comment: (NOTE) The Xpert Xpress SARS-CoV-2/FLU/RSV plus assay is intended as an aid in the diagnosis of influenza from Nasopharyngeal swab specimens and should not be used as a sole basis for treatment. Nasal washings and aspirates are unacceptable for Xpert Xpress SARS-CoV-2/FLU/RSV testing.  Fact  Sheet for Patients: EntrepreneurPulse.com.au  Fact Sheet for Healthcare Providers: IncredibleEmployment.be  This test is not yet approved or cleared by the [place] FDA and has been authorized for detection and/or diagnosis of SARS-CoV-2 by FDA under an Emergency Use Authorization (EUA). This EUA will remain in effect (meaning this test can be used) for the duration of the COVID-19 declaration under Section 564(b)(1) of the Act, 21 U.S.C. section 360bbb-3(b)(1), unless the authorization is terminated or revoked.  Performed at [facility], [address] [place], [place] [address]   Blood culture (routine x 2)     Status: None (Preliminary result)   Collection Time: 08/01/21 11:15 PM   Specimen: BLOOD  Result Value Ref Range Status   Specimen Description   Final    BLOOD Blood Culture results may not be optimal due to an inadequate volume of blood received in culture bottles Performed at [facility], [address] [place], [place] [address]    Special Requests   Final    RIGHT ANTECUBITAL Performed at [facility], [address], [place], [place] [address]    Culture   Final    NO GROWTH 2 DAYS Performed at [facility], [place] [address] [place], [place] [address]    Report Status PENDING  Incomplete  Blood culture (routine x 2)     Status: None (Preliminary result)   Collection Time: 08/02/21  2:27 PM   Specimen: Right Antecubital; Blood  Result Value Ref Range Status   Specimen Description   Final    RIGHT ANTECUBITAL Performed at [facility] [address] [place], [place] [address]    Special Requests   Final    BOTTLES DRAWN AEROBIC ONLY Blood Culture adequate volume Performed at [facility] [address], [place], [place] [address]    Culture   Final    NO GROWTH 2 DAYS Performed at [facility] [address], [place], [place] [address]    Report Status PENDING  Incomplete  MRSA Next Gen by PCR, Nasal     Status: None   Collection Time: 08/02/21  3:58 PM   Specimen: Nasal Mucosa; Nasal Swab  Result Value Ref Range Status   MRSA by PCR Next Gen NOT DETECTED NOT DETECTED Final    Comment: (NOTE) The GeneXpert MRSA Assay (FDA approved for NASAL specimens only), is one component of a comprehensive MRSA colonization surveillance program. It is not intended to diagnose MRSA infection nor to guide or monitor treatment for MRSA infections. Test performance is not FDA approved in patients less  than 85 years old. Performed at [facility], [place] [address] [place], [place] [address]       Radiology Studies: No results found.  Marzetta Board, MD, PhD [facility]  Between 7 am - 7 pm I am available, please contact me via Amion (for emergencies) or Securechat (non urgent messages)  Between 7 pm - 7 am I am not available, please contact night coverage MD/APP via Amion

## 2021-08-04 NOTE — Progress Notes (Signed)
Pt refused CPAP qhs.  Pt encouraged to contact RT should he change his mind.   

## 2021-08-04 NOTE — Plan of Care (Signed)
  Problem: Education: Goal: Knowledge of General Education information will improve Description: Including pain rating scale, medication(s)/side effects and non-pharmacologic comfort measures Outcome: Progressing   Problem: Health Behavior/Discharge Planning: Goal: Ability to manage health-related needs will improve Outcome: Progressing   Problem: Clinical Measurements: Goal: Ability to maintain clinical measurements within normal limits will improve Outcome: Progressing Goal: Will remain free from infection Outcome: Progressing Goal: Diagnostic test results will improve Outcome: Progressing Goal: Respiratory complications will improve Outcome: Progressing Goal: Cardiovascular complication will be avoided Outcome: Progressing   Problem: Nutrition: Goal: Adequate nutrition will be maintained Outcome: Progressing   Problem: Pain Managment: Goal: General experience of comfort will improve Outcome: Progressing   Problem: Safety: Goal: Ability to remain free from injury will improve Outcome: Progressing   Problem: Skin Integrity: Goal: Risk for impaired skin integrity will decrease Outcome: Progressing   

## 2021-08-05 DIAGNOSIS — I5042 Chronic combined systolic (congestive) and diastolic (congestive) heart failure: Secondary | ICD-10-CM

## 2021-08-05 LAB — BASIC METABOLIC PANEL
Anion gap: 9 (ref 5–15)
BUN: 36 mg/dL — ABNORMAL HIGH (ref 8–23)
CO2: 19 mmol/L — ABNORMAL LOW (ref 22–32)
Calcium: 8.7 mg/dL — ABNORMAL LOW (ref 8.9–10.3)
Chloride: 104 mmol/L (ref 98–111)
Creatinine, Ser: 1.33 mg/dL — ABNORMAL HIGH (ref 0.61–1.24)
GFR, Estimated: 58 mL/min — ABNORMAL LOW (ref >60)
Glucose, Bld: 84 mg/dL (ref 70–99)
Potassium: 3.8 mmol/L (ref 3.5–5.1)
Sodium: 132 mmol/L — ABNORMAL LOW (ref 135–145)

## 2021-08-05 MED ORDER — DOXYCYCLINE HYCLATE 100 MG PO CAPS: 100.0000 mg | ORAL_CAPSULE | Freq: Two times a day (BID) | ORAL | 0 refills | Status: AC

## 2021-08-05 MED ORDER — PREDNISONE 10 MG PO TABS: ORAL_TABLET | ORAL | 0 refills | Status: AC

## 2021-08-05 NOTE — Discharge Summary (Signed)
Physician Discharge Summary  Peter [name] WTU:[id number] DOB: 1951-02-13 DOA: 08/01/2021  PCP: Shon Baton, MD  Admit date: 08/01/2021 Discharge date: 08/05/2021  Admitted From: home Disposition:  home  Recommendations for Outpatient Follow-up:  Follow up with PCP in 1-2 weeks Please obtain BMP/CBC in one week  Home Health: none Equipment/Devices: none  Discharge Condition: stable CODE STATUS: Full code Diet recommendation: regular  HPI: Per admitting MD, Peter [name] is a 70 y.o. male with medical history significant of normal CK, history of acute blood loss anemia, chronic combined systolic and diastolic heart failure, osteoarthritis of the right knee, asthma, stage II chronic kidney disease, diverticulosis of the colon, hypertension, hypothyroidism, history of lumbar MRSA infection, OSA on CPAP, substance abuse who came to the emergency department due to progressively worse dyspnea associated with wheezing and mostly of dry cough with occasional sputum production for several days.  He has also had decreased appetite, fatigue, worsening constipation and oliguria.  He denied fever, chills, night sweats.  Rhinorrhea, sore throat, hemoptysis, palpitations, diaphoresis, PND, dyspnea or pitting edema lower extremities.  No nausea, emesis, diarrhea, melena or hematochezia.  No dysuria or hematuria.  No polyuria, polydipsia, polyphagia or blurred vision.  Hospital Course / Discharge diagnoses: Principal Problem Community-acquired pneumonia, asthma exacerbation-patient was admitted to the hospital with persistent URI symptoms, wheezing, consistent with asthma exacerbation.  He was placed on nebulizers, steroids as well as antibiotics with improvement.  His wheezing has resolved, his breathing has improved and he is back to baseline.  He will be discharged home in stable condition with few more days of antibiotics, steroid taper and to continue home nebulizers   Active Problems Acute  kidney injury -he has no history of chronic kidney disease.  Patient baseline creatinine is normal, last checked was September 2021 and it was 0.8.  When he came to the ED on 10/26 it was up to 1.9, and during this admission was 2.9 on admission.  He received IV fluid, improved to 1.3 on discharge.  He is able to tolerate regular p.o., stay hydrated.  His valsartan/HCTZ has been placed temporarily on hold, he was advised to follow-up with PCP within a week, recheck renal function and if stable consider resuming.  HTN (hypertension) -continue metoprolol  Hypothyroidism -Continue levothyroxine 150 mcg p.o. daily. Obstructive sleep apnea syndrome -Continue CPAP at bedtime. Congestive heart failure, NYHA class 3, chronic, combined ([place]) -No signs of decompensation. His EF has improved on last echo, most recently in October 2022 showed EF 55% with grade 1 diastolic dysfunction.  Appears euvolemic Normocytic anemia -Monitor hematocrit and hemoglobin. Leukocytopenia -Monitor leukocytes.  Stable Hyponatremia-monitor Leukopenia, anemia, thrombocytopenia - possibly due to infectious process. Monitor  Sepsis ruled out   Discharge Instructions   Allergies as of 08/05/2021       Reactions   Keflex [cephalexin] Anaphylaxis   Aspirin    Exacerbates asthma attacks.         Medication List     STOP taking these medications    valsartan-hydrochlorothiazide 160-25 MG tablet Commonly known as: DIOVAN-HCT       TAKE these medications    acetaminophen 500 MG tablet Commonly known as: TYLENOL Take 1,000 mg by mouth every 6 (six) hours as needed for moderate pain.   albuterol (5 MG/ML) 0.5% nebulizer solution Commonly known as: PROVENTIL Take 2.5 mg by nebulization every 4 (four) hours as needed for wheezing or shortness of breath.   aspirin EC 81 MG tablet Take 81 mg by mouth  daily.   cetirizine 10 MG tablet Commonly known as: ZYRTEC Take 10 mg by mouth daily as needed for allergies or  rhinitis.   docusate sodium 100 MG capsule Commonly known as: COLACE Take 400 mg by mouth daily as needed for mild constipation.   doxycycline 100 MG capsule Commonly known as: VIBRAMYCIN Take 1 capsule (100 mg total) by mouth 2 (two) times daily for 3 days.   Fish Oil 1000 MG Caps Take 2,000 mg by mouth daily.   furosemide 20 MG tablet Commonly known as: LASIX Take 20 mg by mouth daily as needed for fluid.   ipratropium-albuterol 0.5-2.5 (3) MG/3ML Soln Commonly known as: DUONEB Take 3 mLs by nebulization every 4 (four) hours. What changed:  when to take this reasons to take this   levothyroxine 150 MCG tablet Commonly known as: SYNTHROID Take 150 mcg by mouth daily before breakfast.   metoprolol succinate 100 MG 24 hr tablet Commonly known as: TOPROL-XL Take 100 mg by mouth daily.   montelukast 10 MG tablet Commonly known as: SINGULAIR Take 10 mg by mouth at bedtime.   oxyCODONE 5 MG immediate release tablet Commonly known as: Roxicodone Take 1 tablet (5 mg total) by mouth every 4 (four) hours as needed for severe pain. What changed:  how much to take when to take this   predniSONE 10 MG tablet Commonly known as: DELTASONE Take 4 tablets (40 mg total) by mouth daily for 2 days, THEN 3 tablets (30 mg total) daily for 2 days, THEN 2 tablets (20 mg total) daily for 2 days, THEN 1 tablet (10 mg total) daily for 2 days. Start taking on: August 05, 2021   Vitamin C 500 MG Caps Take 1,000 mg by mouth daily.        Follow-up Information     Shon Baton, MD Follow up in 1 week(s).   Specialty: Internal Medicine Why: for repeating renal function Contact information: [place] [address] [phone]                 Consultations: None   Procedures/Studies:  DG Chest 2 View  Result Date: 08/01/2021 CLINICAL DATA:  SOB EXAM: CHEST - 2 VIEW COMPARISON:  07/20/2021 FINDINGS: Minimal patchy density at the lung bases probably  reflects residual pneumonia seen on chest CT. No pleural effusion. Cardiomediastinal contours are within normal limits. No acute osseous abnormality. IMPRESSION: Minimal patchy density at lung bases probably reflecting residual pneumonia seen on recent chest CT. Electronically Signed   By: Macy Mis M.D.   On: 08/01/2021 16:44   DG Chest 2 View  Result Date: 07/20/2021 CLINICAL DATA:  Shortness of breath, shortness of breath with exertion, history cardiomyopathy, asthma, hypertension, CHF, obstructive sleep apnea syndrome on CPAP EXAM: CHEST - 2 VIEW COMPARISON:  06/03/2020 FINDINGS: Normal heart size, mediastinal contours, and pulmonary vascularity. BILATERAL nipple shadows. Opacity at posterior LEFT lower lobe medially question atelectasis versus infiltrate. Remaining lungs clear. No pleural effusion or pneumothorax. Prior cervical spine fusion. IMPRESSION: Atelectasis versus infiltrate at medial LEFT lower lobe. BILATERAL nipple shadows. Electronically Signed   By: Lavonia Dana M.D.   On: 07/20/2021 15:31   CT Chest Wo Contrast  Result Date: 07/20/2021 CLINICAL DATA:  Suspect pneumonia.  Shortness of breath. EXAM: CT CHEST WITHOUT CONTRAST TECHNIQUE: Multidetector CT imaging of the chest was performed following the standard protocol without IV contrast. COMPARISON:  Chest x-ray 07/20/2021. FINDINGS: Cardiovascular: The ascending aorta is mildly dilated measuring 4 cm. Heart  is normal in size. There is no pericardial effusion. Mediastinum/Nodes: No enlarged mediastinal or axillary lymph nodes. Thyroid gland, trachea, and esophagus demonstrate no significant findings. Lungs/Pleura: There are multifocal ground-glass and tree-in-bud opacities in the right upper lobe and right lower lobe. There are patchy airspace opacities and ground-glass opacities in the left lower lobe. There is a 6 mm pleural base nodule in the left lower lobe image 4/105. There is no pleural effusion or pneumothorax. There  secretions in the bilateral mainstem bronchi. Upper Abdomen: Rounded hypodensities in the liver which are too small to characterize, likely cysts or hemangiomas. Left renal cysts are present measuring up to 3.5 cm. Musculoskeletal: Cervical spinal fusion plate is present. No acute fractures are seen. IMPRESSION: 1. Multifocal ground-glass and tree-in-bud opacities in the right upper lobe and right lower lobe with patchy airspace disease in the left lower lobe. Findings are compatible with multifocal infection. 2. There secretions in the bilateral mainstem bronchi. 3. 6 mm left solid pulmonary nodule. Recommend a non-contrast Chest CT at 6-12 months. If patient is high risk for malignancy, recommend an additional non-contrast Chest CT at 18-24 months; if patient is low risk for malignancy a non-contrast Chest CT at 18-24 months is optional. These guidelines do not apply to immunocompromised patients and patients with cancer. Follow up in patients with significant comorbidities as clinically warranted. For lung cancer screening, adhere to Lung-RADS guidelines. Reference: Radiology. 2017; 284(1):228-43. 4. Mild aneurysmal dilatation of the ascending aorta measuring 4 cm. Recommend semi-annual imaging followup by CTA or MRA and referral to cardiothoracic surgery if not already obtained. This recommendation follows 2010 ACCF/AHA/AATS/ACR/ASA/SCA/SCAI/SIR/STS/SVM Guidelines for the Diagnosis and Management of Patients With Thoracic Aortic Disease. Circulation. 2010; 121: L244-W10. Aortic aneurysm NOS (ICD10-I71.9) Electronically Signed   By: Ronney Asters M.D.   On: 07/20/2021 19:44     Subjective: - no chest pain, shortness of breath, no abdominal pain, nausea or vomiting.   Discharge Exam: BP (!) 130/93   Pulse 92   Temp (!) 97.2 F (36.2 C) (Oral)   Resp 18   Ht 6\' 2"  (1.88 m)   Wt 86.6 kg   SpO2 98%   BMI 24.51 kg/m   General: Pt is alert, awake, not in acute distress Cardiovascular: RRR, S1/S2 +,  no rubs, no gallops Respiratory: CTA bilaterally, no wheezing, no rhonchi Abdominal: Soft, NT, ND, bowel sounds + Extremities: no edema, no cyanosis    The results of significant diagnostics from this hospitalization (including imaging, microbiology, ancillary and laboratory) are listed below for reference.     Microbiology: Recent Results (from the past 240 hour(s))  Resp Panel by RT-PCR (Flu A&B, Covid) Nasopharyngeal Swab     Status: None   Collection Time: 08/01/21  8:50 PM   Specimen: Nasopharyngeal Swab; Nasopharyngeal(NP) swabs in vial transport medium  Result Value Ref Range Status   SARS Coronavirus 2 by RT PCR NEGATIVE NEGATIVE Final    Comment: (NOTE) SARS-CoV-2 target nucleic acids are NOT DETECTED.  The SARS-CoV-2 RNA is generally detectable in upper respiratory specimens during the acute phase of infection. The lowest concentration of SARS-CoV-2 viral copies this assay can detect is 138 copies/mL. A negative result does not preclude SARS-Cov-2 infection and should not be used as the sole basis for treatment or other patient management decisions. A negative result may occur with  improper specimen collection/handling, submission of specimen other than nasopharyngeal swab, presence of viral mutation(s) within the areas targeted by this assay, and inadequate number of viral  copies(<138 copies/mL). A negative result must be combined with clinical observations, patient history, and epidemiological information. The expected result is Negative.  Fact Sheet for Patients:  EntrepreneurPulse.com.au  Fact Sheet for Healthcare Providers:  IncredibleEmployment.be  This test is no t yet approved or cleared by the [place] FDA and  has been authorized for detection and/or diagnosis of SARS-CoV-2 by FDA under an Emergency Use Authorization (EUA). This EUA will remain  in effect (meaning this test can be used) for the duration of  the COVID-19 declaration under Section 564(b)(1) of the Act, 21 U.S.C.section 360bbb-3(b)(1), unless the authorization is terminated  or revoked sooner.       Influenza A by PCR NEGATIVE NEGATIVE Final   Influenza B by PCR NEGATIVE NEGATIVE Final    Comment: (NOTE) The Xpert Xpress SARS-CoV-2/FLU/RSV plus assay is intended as an aid in the diagnosis of influenza from Nasopharyngeal swab specimens and should not be used as a sole basis for treatment. Nasal washings and aspirates are unacceptable for Xpert Xpress SARS-CoV-2/FLU/RSV testing.  Fact Sheet for Patients: EntrepreneurPulse.com.au  Fact Sheet for Healthcare Providers: IncredibleEmployment.be  This test is not yet approved or cleared by the [place] FDA and has been authorized for detection and/or diagnosis of SARS-CoV-2 by FDA under an Emergency Use Authorization (EUA). This EUA will remain in effect (meaning this test can be used) for the duration of the COVID-19 declaration under Section 564(b)(1) of the Act, 21 U.S.C. section 360bbb-3(b)(1), unless the authorization is terminated or revoked.  Performed at [facility], [address] [place], [place] [address]   Blood culture (routine x 2)     Status: None (Preliminary result)   Collection Time: 08/01/21 11:15 PM   Specimen: BLOOD  Result Value Ref Range Status   Specimen Description   Final    BLOOD Blood Culture results may not be optimal due to an inadequate volume of blood received in culture bottles Performed at [facility], [address], [place], [place] [address]    Special Requests   Final    RIGHT ANTECUBITAL Performed at [facility], [address] [place], [place] [address]    Culture   Final    NO GROWTH 3 DAYS Performed at [facility], [place] [address], [place], [place] [address]    Report Status PENDING  Incomplete  Blood culture  (routine x 2)     Status: None (Preliminary result)   Collection Time: 08/02/21  2:27 PM   Specimen: Right Antecubital; Blood  Result Value Ref Range Status   Specimen Description   Final    RIGHT ANTECUBITAL Performed at [facility] [address], [place], [place] [address]    Special Requests   Final    BOTTLES DRAWN AEROBIC ONLY Blood Culture adequate volume Performed at [facility] [address], [place], [place] [address]    Culture   Final    NO GROWTH 3 DAYS Performed at [facility], [place] [address] [place],  [address]    Report Status PENDING  Incomplete  MRSA Next Gen by PCR, Nasal     Status: None   Collection Time: 08/02/21  3:58 PM   Specimen: Nasal Mucosa; Nasal Swab  Result Value Ref Range Status   MRSA by PCR Next Gen NOT DETECTED NOT DETECTED Final    Comment: (NOTE) The GeneXpert MRSA Assay (FDA approved for NASAL specimens only), is one component of a comprehensive MRSA  colonization surveillance program. It is not intended to diagnose MRSA infection nor to guide or monitor treatment for MRSA infections. Test performance is not FDA approved in patients less than 61 years old. Performed at [facility], [place] [address] [place], [place] [address]      Labs: Basic Metabolic Panel: Recent Labs  Lab 08/01/21 2050 08/03/21 0511 08/04/21 0513 08/05/21 0450  NA 135 132* 133* 132*  K 4.2 4.5 4.0 3.8  CL 99 104 104 104  CO2 23 20* 22 19*  GLUCOSE 77 117* 85 84  BUN 52* 39* 42* 36*  CREATININE 2.93* 1.88* 1.61* 1.33*  CALCIUM 9.6 8.7* 9.0 8.7*   Liver Function Tests: Recent Labs  Lab 08/03/21 0511 08/04/21 0513  AST 23 23  ALT 15 16  ALKPHOS 70 65  BILITOT 0.7 0.9  PROT 6.7 6.6  ALBUMIN 3.7 3.6   CBC: Recent Labs  Lab 08/01/21 2050 08/03/21 0511 08/04/21 0513  WBC 3.8* 2.8* 3.0*  NEUTROABS 2.2  --   --   HGB 12.5* 11.0* 10.9*  HCT 38.0* 32.9* 32.4*  MCV 85.6 86.4  85.5  PLT 176 144* 143*   CBG: No results for input(s): GLUCAP in the last 168 hours. Hgb A1c No results for input(s): HGBA1C in the last 72 hours. Lipid Profile No results for input(s): CHOL, HDL, LDLCALC, TRIG, CHOLHDL, LDLDIRECT in the last 72 hours. Thyroid function studies No results for input(s): TSH, T4TOTAL, T3FREE, THYROIDAB in the last 72 hours.  Invalid input(s): FREET3 Urinalysis    Component Value Date/Time   COLORURINE YELLOW 12/29/2013 2100   APPEARANCEUR CLEAR 12/29/2013 2100   LABSPEC 1.022 12/29/2013 2100   PHURINE 8.0 12/29/2013 2100   GLUCOSEU NEGATIVE 12/29/2013 2100   HGBUR NEGATIVE 12/29/2013 2100   [place] NEGATIVE 12/29/2013 2100   [place] NEGATIVE 12/29/2013 2100   PROTEINUR NEGATIVE 12/29/2013 2100   UROBILINOGEN 0.2 12/29/2013 2100   NITRITE NEGATIVE 12/29/2013 2100   LEUKOCYTESUR NEGATIVE 12/29/2013 2100    FURTHER DISCHARGE INSTRUCTIONS:   Get Medicines reviewed and adjusted: Please take all your medications with you for your next visit with your Primary MD   Laboratory/radiological data: Please request your Primary MD to go over all hospital tests and procedure/radiological results at the follow up, please ask your Primary MD to get all Hospital records sent to his/her office.   In some cases, they will be blood work, cultures and biopsy results pending at the time of your discharge. Please request that your primary care M.D. goes through all the records of your hospital data and follows up on these results.   Also Note the following: If you experience worsening of your admission symptoms, develop shortness of breath, life threatening emergency, suicidal or homicidal thoughts you must seek medical attention immediately by calling 911 or calling your MD immediately  if symptoms less severe.   You must read complete instructions/literature along with all the possible adverse reactions/side effects for all the Medicines you take and that  have been prescribed to you. Take any new Medicines after you have completely understood and accpet all the possible adverse reactions/side effects.    Do not drive when taking Pain medications or sleeping medications (Benzodaizepines)   Do not take more than prescribed Pain, Sleep and Anxiety Medications. It is not advisable to combine anxiety,sleep and pain medications without talking with your primary care practitioner   Special Instructions: If you have smoked or chewed Tobacco  in the last 2 yrs please  stop smoking, stop any regular Alcohol  and or any Recreational drug use.   Wear Seat belts while driving.   Please note: You were cared for by a hospitalist during your hospital stay. Once you are discharged, your primary care physician will handle any further medical issues. Please note that NO REFILLS for any discharge medications will be authorized once you are discharged, as it is imperative that you return to your primary care physician (or establish a relationship with a primary care physician if you do not have one) for your post hospital discharge needs so that they can reassess your need for medications and monitor your lab values.  Time coordinating discharge: 40 minutes  SIGNED:  Marzetta Board, MD, PhD 08/05/2021, 11:36 AM

## 2021-08-05 NOTE — Progress Notes (Signed)
Pt discharged to home. Discharge instructions and medication education provided to pt.

## 2021-08-05 NOTE — Consult Note (Signed)
[facility] CM Inpatient Consult   08/05/2021  Peter [name] 1951/01/21 [id number]  [facility] CM)   Patient evaluated for community based chronic complex disease management services with [facility] with noted high unplanned readmission risk score.   Spoke with patient bedside. Explained [facility] CM as outpatient services for assistance with chronic disease management and care coordination. Patient declines services at this time.   Netta Cedars, MSN, RN [facility] [employer] [phone]  Toll free office [phone]

## 2021-08-07 LAB — CULTURE, BLOOD (ROUTINE X 2)
Culture: NO GROWTH
Culture: NO GROWTH
Special Requests: ADEQUATE

## 2021-08-16 DIAGNOSIS — I5022 Chronic systolic (congestive) heart failure: Secondary | ICD-10-CM | POA: Diagnosis not present

## 2021-08-16 DIAGNOSIS — N182 Chronic kidney disease, stage 2 (mild): Secondary | ICD-10-CM | POA: Diagnosis not present

## 2021-08-16 DIAGNOSIS — R634 Abnormal weight loss: Secondary | ICD-10-CM | POA: Diagnosis not present

## 2021-08-16 DIAGNOSIS — R911 Solitary pulmonary nodule: Secondary | ICD-10-CM | POA: Diagnosis not present

## 2021-08-16 DIAGNOSIS — I130 Hypertensive heart and chronic kidney disease with heart failure and stage 1 through stage 4 chronic kidney disease, or unspecified chronic kidney disease: Secondary | ICD-10-CM | POA: Diagnosis not present

## 2021-08-16 DIAGNOSIS — J189 Pneumonia, unspecified organism: Secondary | ICD-10-CM | POA: Diagnosis not present

## 2021-08-17 ENCOUNTER — Other Ambulatory Visit: Payer: Self-pay

## 2021-08-17 ENCOUNTER — Encounter: Payer: Self-pay | Admitting: Cardiology

## 2021-08-17 ENCOUNTER — Ambulatory Visit: Payer: Medicare HMO | Admitting: Cardiology

## 2021-08-17 VITALS — BP 118/75 | HR 92 | Temp 97.9°F | Resp 16 | Ht 74.0 in | Wt 196.4 lb

## 2021-08-17 DIAGNOSIS — I77810 Thoracic aortic ectasia: Secondary | ICD-10-CM | POA: Diagnosis not present

## 2021-08-17 DIAGNOSIS — I10 Essential (primary) hypertension: Secondary | ICD-10-CM | POA: Diagnosis not present

## 2021-08-17 DIAGNOSIS — I5032 Chronic diastolic (congestive) heart failure: Secondary | ICD-10-CM | POA: Diagnosis not present

## 2021-08-17 MED ORDER — VALSARTAN-HYDROCHLOROTHIAZIDE 160-25 MG PO TABS: 1.0000 | ORAL_TABLET | ORAL | Status: DC

## 2021-08-17 NOTE — Progress Notes (Signed)
Primary Physician/Referring:  Peter Baton, MD  Patient ID: Peter [name], male    DOB: 26-Jul-1951, 70 y.o.   MRN: [id number]  Chief Complaint  Patient presents with   Hypertension   Follow-up    6 weeks   HPI:    HPI: Peter [name]  is a 70 y.o. male  African-American male with a history of nonischemic cardiomyopathy with severe LV systolic dysfunction, cardiac catheterization  03/11/2013 had revealed no significant coronary artery disease, ejection fraction 25%, normalization of LVEF by medical therapy.  He has history of hypertension, very mild hyperlipidemia, mild aortic root dilatation, OSA on CPAP, statins were discontinued as he had had markedly elevated CK enzymes and rhabdomyolysis, etiology was not known in 2014.  He also has radioactive iodine therapy for hyperthyroidismin 2014 and suspect hyperthyroidism to be the etiology for his rhabdomyolysis. I had seen him a month ago, at that time I had started him on valsartan HCT and discontinued Lasix.  However patient has had 2 emergency room visit and he was admitted to the hospital on 11th 722 with community-acquired pneumonia and acute renal failure where valsartan HCT was held and recommended outpatient follow-up and recheck of labs and to reinitiate therapy once renal function stable.  He has started back furosemide as he has developed significant leg edema again after the hospitalization.  Valsartan has been on hold.  Patient also started BiDil due to elevated blood pressure but in the past he has not been able to tolerate more than 1 tablet a day.   Past Medical History:  Diagnosis Date   Abnormal CK 03/11/2013   Acute blood loss anemia 04/11/2016   Acute combined systolic and diastolic heart failure ([place]) 03/11/2013   Allergy    Arthritis    right knee   Asthma    Back pain    Chronic kidney disease    Colon polyps 2003, 2015   2003: hyperplastic. 05/2014: tubular adenoma   Complication of anesthesia    problems  urinating after anesthesia   Diverticulosis of colon 2003, 2015   Hypertension    Hypothyroidism    had Graves' disease - thyroid ablation   MRSA (methicillin resistant Staphylococcus aureus) infection    lumbar-2015   Obstructive sleep apnea syndrome 09/22/2014   uses cpap   Opiate use 10/02/2017   S/P radioactive iodine thyroid ablation 06/04/2017   Substance abuse ([place])    not in many years   Wears glasses    Wears partial dentures    top   Past Surgical History:  Procedure Laterality Date   ANTERIOR CERVICAL DECOMP/DISCECTOMY FUSION N/A 07/30/2013   Procedure: CERVICAL FIVE,CERVICAL SIX CORPECTOMY,ANTERIOR ARTHRODESIS,PEEK INTERBODY GRAFT CERVICAL FOUR-CERVICAL SEVEN,ANTERIOR INSTRUMENTATION;  Surgeon: Winfield Cunas, MD;  Location: [facility];  Service: Neurosurgery;  Laterality: N/A;   ANTERIOR CERVICAL DECOMP/DISCECTOMY FUSION N/A 04/16/2015   Procedure: CERVICAL THREE-FOUR ANTERIOR CERVICAL DECOMPRESSION/DISCECTOMY FUSION 1 LEVEL;  Surgeon: Ashok Pall, MD;  Location: [facility];  Service: Neurosurgery;  Laterality: N/A;  C34 anterior cervical decompression with fusion plating and bonegraft   BACK SURGERY  1/15   lumb fusion   CARDIAC CATHETERIZATION  2014   CARPAL TUNNEL RELEASE Right 03/23/2014   Procedure: RIGHT CARPAL TUNNEL RELEASE;  Surgeon: Tennis Must, MD;  Location: [facility];  Service: Orthopedics;  Laterality: Right;   COLONOSCOPY  2003, 2015   LEFT AND RIGHT HEART CATHETERIZATION WITH CORONARY ANGIOGRAM N/A 03/11/2013   Procedure: LEFT AND RIGHT HEART CATHETERIZATION WITH  CORONARY ANGIOGRAM;  Surgeon: Laverda Page, MD;  Location: [facility] CATH LAB;  Service: Cardiovascular;  Laterality: N/A;   LUMBAR WOUND DEBRIDEMENT N/A 11/07/2013   Procedure: LUMBAR WOUND DEBRIDEMENT;  Surgeon: Winfield Cunas, MD;  Location: [facility];  Service: Neurosurgery;  Laterality: N/A;   TONSILLECTOMY  1958   Social History   Tobacco Use   Smoking status: Former     Packs/day: 0.50    Years: 0.00    Pack years: 0.00    Types: Cigarettes    Quit date: 08/14/1995    Years since quitting: 26.0   Smokeless tobacco: Never  Substance Use Topics   Alcohol use: No    Alcohol/week: 0.0 standard drinks  Marital Status: Married    ROS   Review of Systems  Cardiovascular:  Positive for leg swelling. Negative for chest pain and dyspnea on exertion.  Respiratory:  Positive for wheezing.   Musculoskeletal:  Positive for arthritis, back pain and joint pain.  Gastrointestinal:  Negative for melena.   Objective  Blood pressure 118/75, pulse 92, temperature 97.9 F (36.6 C), temperature source Temporal, resp. rate 16, height 6\' 2"  (1.88 m), weight 196 lb 6.4 oz (89.1 kg), SpO2 97 %. Body mass index is 25.22 kg/m.  Vitals with BMI 08/17/2021 08/05/2021 08/05/2021  Height 6\' 2"  - -  Weight 196 lbs 6 oz - -  BMI 58.09 - -  Systolic 983 382 505  Diastolic 75 93 99  Pulse 92 92 66      Physical Exam Constitutional:      Appearance: He is well-developed.  Neck:     Thyroid: No thyromegaly.     Vascular: No carotid bruit or JVD.  Cardiovascular:     Rate and Rhythm: Normal rate and regular rhythm.     Pulses: Intact distal pulses.     Heart sounds: Normal heart sounds. No murmur heard.   No gallop.  Pulmonary:     Effort: Pulmonary effort is normal.     Breath sounds: Normal breath sounds.  Abdominal:     General: Bowel sounds are normal.     Palpations: Abdomen is soft.  Musculoskeletal:     Right lower leg: Edema (2 + pitting) present.     Left lower leg: Edema (2 + pitting) present.   Radiology: No results found.  Laboratory examination:   BMP Latest Ref Rng & Units 08/05/2021 08/04/2021 08/03/2021  Glucose 70 - 99 mg/dL 84 85 117(H)  BUN 8 - 23 mg/dL 36(H) 42(H) 39(H)  Creatinine 0.61 - 1.24 mg/dL 1.33(H) 1.61(H) 1.88(H)  Sodium 135 - 145 mmol/L 132(L) 133(L) 132(L)  Potassium 3.5 - 5.1 mmol/L 3.8 4.0 4.5  Chloride 98 - 111 mmol/L 104  104 104  CO2 22 - 32 mmol/L 19(L) 22 20(L)  Calcium 8.9 - 10.3 mg/dL 8.7(L) 9.0 8.7(L)  Estimated Creatinine Clearance: 60.9 mL/min (A) (by C-G formula based on SCr of 1.33 mg/dL (H)).   External labs:  Cholesterol, total 147.000 m 03/08/2021 HDL 49.000 mg 03/08/2021 LDL 73.000 mg 03/08/2021 Triglycerides 124.000 m 03/08/2021  A1C 5.400 % 03/08/2021 TSH 14.500 03/08/2021  Hemoglobin 13.400 g/d 06/03/2020  Creatinine, Serum 0.830 mg/ 06/03/2020 Potassium 4.000 mEq 04/19/2021 Magnesium 2.200 mg/ 04/19/2021 ALT (SGPT) 26.000 IU/ 03/08/2021   Medications   Current Outpatient Medications on File Prior to Visit  Medication Sig Dispense Refill   acetaminophen (TYLENOL) 500 MG tablet Take 1,000 mg by mouth every 6 (six) hours as needed for moderate pain.  albuterol (PROVENTIL) (5 MG/ML) 0.5% nebulizer solution Take 2.5 mg by nebulization every 4 (four) hours as needed for wheezing or shortness of breath.     Ascorbic Acid (VITAMIN C) 500 MG CAPS Take 1,000 mg by mouth daily.     cetirizine (ZYRTEC) 10 MG tablet Take 10 mg by mouth daily as needed for allergies or rhinitis.     docusate sodium (COLACE) 100 MG capsule Take 400 mg by mouth daily as needed for mild constipation.     furosemide (LASIX) 20 MG tablet Take 20 mg by mouth daily as needed for fluid.     ipratropium-albuterol (DUONEB) 0.5-2.5 (3) MG/3ML SOLN Take 3 mLs by nebulization every 4 (four) hours. (Patient taking differently: Take 3 mLs by nebulization every 4 (four) hours as needed (Shortness of breath).) 360 mL 0   levothyroxine (SYNTHROID) 150 MCG tablet Take 150 mcg by mouth daily before breakfast.     metoprolol succinate (TOPROL-XL) 100 MG 24 hr tablet Take 100 mg by mouth daily.      montelukast (SINGULAIR) 10 MG tablet Take 10 mg by mouth at bedtime.     Omega-3 Fatty Acids (FISH OIL) 1000 MG CAPS Take 2,000 mg by mouth daily.      oxyCODONE (ROXICODONE) 5 MG immediate release tablet Take 1 tablet (5 mg total) by mouth  every 4 (four) hours as needed for severe pain. (Patient taking differently: Take 10 mg by mouth 2 (two) times daily.) 45 tablet 0   No current facility-administered medications on file prior to visit.     Cardiac Studies:   Lower extremity venous insufficiency study 06/20/2011:  No evidence of venous insufficiency.  No evidence of DVT.  Cardiac catheterization 03/11/2013: Ejection fraction 15%, global hypokinesis, mild luminal irregularity. Normal right heart pressure.. EF 07/14/13: 20-25% Improved on Medical therapy.  PCV ECHOCARDIOGRAM COMPLETE 07/05/2021  Narrative Echocardiogram 07/05/2021: Left ventricle cavity is normal in size. Moderate concentric hypertrophy of the left ventricle. Normal global wall motion. Normal LV systolic function with EF 55%. Doppler evidence of grade I (impaired) diastolic dysfunction, normal LAP. Moderate (Grade II) mitral regurgitation. Mild to moderate tricuspid regurgitation. Peak RA-RV gradient 19 mmHg. IVC is not seen. Mild pulmonic regurgitation. The aortic root is upper limit normal at 3.8 cm. Previous study on 06/21/2020 measured aortic root at 4.2 cm, and estimated PASP at 34 mmHg. Consider CT imaging of aorta, if clinically indicated.     EKG:   EKG 06/27/2021: Normal sinus rhythm at rate of 73 bpm, normal axis.  Nonspecific T abnormality.no significant change from 06/27/2019.     Assessment     ICD-10-CM   1. Primary hypertension  I10 valsartan-hydrochlorothiazide (DIOVAN HCT) 160-25 MG tablet    2. Chronic diastolic (congestive) heart failure (HCC)  I50.32 PCV ECHOCARDIOGRAM COMPLETE    Basic metabolic panel    Brain natriuretic peptide    valsartan-hydrochlorothiazide (DIOVAN HCT) 160-25 MG tablet    3. Aortic root dilatation (HCC)  I77.810 PCV ECHOCARDIOGRAM COMPLETE     Recommendations:   Ellard Nan  Is a 70 y.o. African-American male with a history of nonischemic cardiomyopathy with severe LV systolic dysfunction, cardiac  catheterization  03/11/2013 had revealed no significant coronary artery disease, ejection fraction 25%, normalization of LVEF by medical therapy.  He has history of hypertension, very mild hyperlipidemia, mild aortic root dilatation, OSA on CPAP, statins were discontinued as he had had markedly elevated CK enzymes and rhabdomyolysis, etiology was not known in 2014.  He also has radioactive  iodine therapy for hyperthyroidismin 2014 and suspect hyperthyroidism to be the etiology for his rhabdomyolysis.   I had seen him a month ago, at that time I had started him on valsartan HCT and discontinued Lasix.  However patient has had 2 emergency room visit and he was admitted to the hospital on 11th 722 with community-acquired pneumonia and acute renal failure where valsartan HCT was held and recommended outpatient follow-up and recheck of labs and to reinitiate therapy once renal function stable.  Patient is presently doing well, blood pressure is well controlled, he is back on taking furosemide on a as needed basis, he has developed significant leg edema probably due to significant amount of fluid that has been resuscitated after his recent hospitalization.  Advised him to continue Lasix on a daily basis until his leg edema is completely resolved.  He is taking BiDil 1 tablet once a day for elevated blood pressure, advised him to discontinue this, he is not able to tolerate BiDil due to severe headaches.  I will restart him back on valsartan HCT 160/25 mg in the morning and will obtain a BMP in 3 weeks from now.  Discussed with Dr. Shon [name], serum creatinine is back to baseline at 1.1 as of 08/16/2021.  Otherwise stable from cardiac standpoint, I think that he will benefit from being on valsartan in view of chronic diastolic heart failure and diastolic dysfunction.  I will see him back in 6 weeks for follow-up.  Reviewed his echocardiogram, stable LV systolic function without recurrence of LV dysfunction.  He will  also benefit from being on ARB due to aortic root dilatation.  I will repeat his echocardiogram in a year for follow-up.  I spent 40 min on this encounter with review of hospital records and coordination of care   Adrian Prows, MD, [facility] 08/17/2021, 2:47 PM Office: [phone]

## 2021-08-22 NOTE — Progress Notes (Signed)
Labs 08/16/2021:  Serum glucose 86 mg, BUN 19, creatinine 1.1, EGFR 80 mL, potassium 4.0.

## 2021-08-24 DIAGNOSIS — N182 Chronic kidney disease, stage 2 (mild): Secondary | ICD-10-CM | POA: Diagnosis not present

## 2021-08-24 DIAGNOSIS — E785 Hyperlipidemia, unspecified: Secondary | ICD-10-CM | POA: Diagnosis not present

## 2021-08-24 DIAGNOSIS — M1990 Unspecified osteoarthritis, unspecified site: Secondary | ICD-10-CM | POA: Diagnosis not present

## 2021-08-24 DIAGNOSIS — I130 Hypertensive heart and chronic kidney disease with heart failure and stage 1 through stage 4 chronic kidney disease, or unspecified chronic kidney disease: Secondary | ICD-10-CM | POA: Diagnosis not present

## 2021-08-25 DIAGNOSIS — I130 Hypertensive heart and chronic kidney disease with heart failure and stage 1 through stage 4 chronic kidney disease, or unspecified chronic kidney disease: Secondary | ICD-10-CM | POA: Diagnosis not present

## 2021-08-25 DIAGNOSIS — D649 Anemia, unspecified: Secondary | ICD-10-CM | POA: Diagnosis not present

## 2021-08-25 DIAGNOSIS — N182 Chronic kidney disease, stage 2 (mild): Secondary | ICD-10-CM | POA: Diagnosis not present

## 2021-08-25 DIAGNOSIS — E785 Hyperlipidemia, unspecified: Secondary | ICD-10-CM | POA: Diagnosis not present

## 2021-08-26 DIAGNOSIS — I150 Renovascular hypertension: Secondary | ICD-10-CM | POA: Insufficient documentation

## 2021-08-26 DIAGNOSIS — I701 Atherosclerosis of renal artery: Secondary | ICD-10-CM | POA: Insufficient documentation

## 2021-08-26 MED ORDER — HYDRALAZINE HCL 50 MG PO TABS: 50.0000 mg | ORAL_TABLET | Freq: Three times a day (TID) | ORAL | 2 refills | Status: DC

## 2021-08-26 NOTE — Progress Notes (Addendum)
  Please discontinue valsartan HCT as your creatinine has gone up again.  You are unable to tolerate BiDil due to headaches.  I will place you on hydralazine 50 mg 3 times a day, this will not give any headaches.  He will need renal arteriogram as you have renal artery stent and this may be the reason why your blood pressure is not controlled and you continue to have recurrent episodes of kidney failure with ACE inhibitor's or ARB.  Labs 08/26/2021:  Serum glucose 83 mg, BUN 35, creatinine 1.7, EGFR 48 mL, potassium 4.3.    ICD-10-CM   1. Renovascular hypertension  I15.0 hydrALAZINE (APRESOLINE) 50 MG tablet    2. Renal artery stenosis (HCC) CT angiogram 2019 right renal stent patent, moderate disease left renal artery.  I70.1       Meds ordered this encounter  Medications   hydrALAZINE (APRESOLINE) 50 MG tablet    Sig: Take 1 tablet (50 mg total) by mouth 3 (three) times daily.    Dispense:  90 tablet    Refill:  2     Medications Discontinued During This Encounter  Medication Reason   valsartan-hydrochlorothiazide (DIOVAN HCT) 160-25 MG tablet Side effect (s)      Adrian Prows, MD, [facility] 08/26/2021, 4:39 PM Office: [phone] Fax: [phone] Pager: [phone]

## 2021-08-26 NOTE — Progress Notes (Signed)
Could you please call and confirm that he is stopped taking valsartan, I will see him in the office in 4 weeks or he can see him if you want sooner he will need renal angiogram.

## 2021-08-30 NOTE — Progress Notes (Signed)
LVM for patient to call the office back and confirm he is not taking valsartan.

## 2021-08-31 NOTE — Progress Notes (Signed)
Spoke to patient directly. He discontinued valsartan yesterday and has repeat labs with PCP upcoming. Have requested these labs be sent to our office for review.

## 2021-09-02 ENCOUNTER — Other Ambulatory Visit: Payer: Self-pay

## 2021-09-02 DIAGNOSIS — I130 Hypertensive heart and chronic kidney disease with heart failure and stage 1 through stage 4 chronic kidney disease, or unspecified chronic kidney disease: Secondary | ICD-10-CM | POA: Diagnosis not present

## 2021-09-02 DIAGNOSIS — E785 Hyperlipidemia, unspecified: Secondary | ICD-10-CM | POA: Diagnosis not present

## 2021-09-02 DIAGNOSIS — N182 Chronic kidney disease, stage 2 (mild): Secondary | ICD-10-CM | POA: Diagnosis not present

## 2021-09-06 DIAGNOSIS — I5032 Chronic diastolic (congestive) heart failure: Secondary | ICD-10-CM | POA: Diagnosis not present

## 2021-09-07 ENCOUNTER — Other Ambulatory Visit: Payer: Self-pay | Admitting: Cardiology

## 2021-09-07 DIAGNOSIS — I150 Renovascular hypertension: Secondary | ICD-10-CM

## 2021-09-07 DIAGNOSIS — I701 Atherosclerosis of renal artery: Secondary | ICD-10-CM

## 2021-09-07 LAB — BASIC METABOLIC PANEL
BUN/Creatinine Ratio: 15 (ref 10–24)
BUN: 28 mg/dL — ABNORMAL HIGH (ref 8–27)
CO2: 24 mmol/L (ref 20–29)
Calcium: 10 mg/dL (ref 8.6–10.2)
Chloride: 104 mmol/L (ref 96–106)
Creatinine, Ser: 1.83 mg/dL — ABNORMAL HIGH (ref 0.76–1.27)
Glucose: 95 mg/dL (ref 70–99)
Potassium: 4.7 mmol/L (ref 3.5–5.2)
Sodium: 142 mmol/L (ref 134–144)
eGFR: 39 mL/min/{1.73_m2} — ABNORMAL LOW (ref >59)

## 2021-09-07 LAB — BRAIN NATRIURETIC PEPTIDE: BNP: 6.5 pg/mL (ref 0.0–100.0)

## 2021-09-07 NOTE — Progress Notes (Signed)
Patient developing recurrent episodes of acute renal failure with ACE inhibitor/ARB.  Suspect he may have significant renal artery stenosis, patient remotely has had abnormal CT scan of the abdomen revealing moderate stenosis in the renals.  We will schedule him for renal artery duplex prior to his next office visit soon in the next 2 to 3 weeks.  Renal function is also being followed by PCP.

## 2021-09-07 NOTE — Progress Notes (Signed)
ICD-10-CM   1. Renal artery stenosis (HCC) CT angiogram 2019 right renal stent patent, moderate disease left renal artery.  I70.1 PCV RENAL/RENAL ARTERY DUPLEX COMPLETE    2. Renovascular hypertension  I15.0 PCV RENAL/RENAL ARTERY DUPLEX COMPLETE      Peter Prows, MD, [facility] 09/07/2021, 3:46 PM Office: [phone] Fax: [phone] Pager: [phone]

## 2021-09-08 DIAGNOSIS — I130 Hypertensive heart and chronic kidney disease with heart failure and stage 1 through stage 4 chronic kidney disease, or unspecified chronic kidney disease: Secondary | ICD-10-CM | POA: Diagnosis not present

## 2021-09-08 DIAGNOSIS — N182 Chronic kidney disease, stage 2 (mild): Secondary | ICD-10-CM | POA: Diagnosis not present

## 2021-09-08 DIAGNOSIS — E785 Hyperlipidemia, unspecified: Secondary | ICD-10-CM | POA: Diagnosis not present

## 2021-09-28 ENCOUNTER — Encounter: Payer: Self-pay | Admitting: Cardiology

## 2021-09-28 ENCOUNTER — Ambulatory Visit: Payer: Medicare HMO | Admitting: Cardiology

## 2021-09-28 ENCOUNTER — Other Ambulatory Visit: Payer: Self-pay

## 2021-09-28 VITALS — BP 140/81 | HR 79 | Temp 98.3°F | Resp 17 | Ht 74.0 in | Wt 193.8 lb

## 2021-09-28 DIAGNOSIS — I5032 Chronic diastolic (congestive) heart failure: Secondary | ICD-10-CM

## 2021-09-28 DIAGNOSIS — I150 Renovascular hypertension: Secondary | ICD-10-CM | POA: Diagnosis not present

## 2021-09-28 DIAGNOSIS — I701 Atherosclerosis of renal artery: Secondary | ICD-10-CM

## 2021-09-28 DIAGNOSIS — I10 Essential (primary) hypertension: Secondary | ICD-10-CM | POA: Diagnosis not present

## 2021-09-28 NOTE — Progress Notes (Signed)
Primary Physician/Referring:  Shon Baton, MD  Patient ID: Peter [name], male    DOB: May 13, 1951, 71 y.o.   MRN: [id number]  Chief Complaint  Patient presents with   CHRONIC DIASTOLIC HEART FAILURE    1 YEAR   PRIMARY HYPERTENSION   HPI:    HPI: Peter [name]  is a 71 y.o. male  AAfrican-American male with a history of nonischemic cardiomyopathy with severe LV systolic dysfunction, cardiac catheterization  03/11/2013 had revealed no significant coronary artery disease, ejection fraction 25%, normalization of LVEF by medical therapy.  He has history of hypertension, very mild hyperlipidemia, mild aortic root dilatation, OSA on CPAP, statins were discontinued as he had had markedly elevated CK enzymes and rhabdomyolysis, radioactive iodine therapy for hyperthyroidismin 2014 and suspect hyperthyroidism to be the etiology for his rhabdomyolysis.   Patient has been having frequent episodes of increased leg edema and also uncontrolled hypertension, ARB therapy led to acute renal failure.  I have been in close contact with his PCP Dr. [name] in managing his renal failure and hypertension, fortunately since discontinuing ARB, renal failure is completely resolved.  Patient has also made lifestyle changes and is being watchful of his salt intake.  Leg edema is improved.   Past Medical History:  Diagnosis Date   Abnormal CK 03/11/2013   Acute blood loss anemia 04/11/2016   Acute combined systolic and diastolic heart failure ([place]) 03/11/2013   Allergy    Arthritis    right knee   Asthma    Back pain    Chronic kidney disease    Colon polyps 2003, 2015   2003: hyperplastic. 05/2014: tubular adenoma   Complication of anesthesia    problems urinating after anesthesia   Diverticulosis of colon 2003, 2015   Hypertension    Hypothyroidism    had Graves' disease - thyroid ablation   MRSA (methicillin resistant Staphylococcus aureus) infection    lumbar-2015   Obstructive sleep apnea  syndrome 09/22/2014   uses cpap   Opiate use 10/02/2017   S/P radioactive iodine thyroid ablation 06/04/2017   Substance abuse ([place])    not in many years   Wears glasses    Wears partial dentures    top   Past Surgical History:  Procedure Laterality Date   ANTERIOR CERVICAL DECOMP/DISCECTOMY FUSION N/A 07/30/2013   Procedure: CERVICAL FIVE,CERVICAL SIX CORPECTOMY,ANTERIOR ARTHRODESIS,PEEK INTERBODY GRAFT CERVICAL FOUR-CERVICAL SEVEN,ANTERIOR INSTRUMENTATION;  Surgeon: Winfield Cunas, MD;  Location: [facility];  Service: Neurosurgery;  Laterality: N/A;   ANTERIOR CERVICAL DECOMP/DISCECTOMY FUSION N/A 04/16/2015   Procedure: CERVICAL THREE-FOUR ANTERIOR CERVICAL DECOMPRESSION/DISCECTOMY FUSION 1 LEVEL;  Surgeon: Ashok Pall, MD;  Location: [facility];  Service: Neurosurgery;  Laterality: N/A;  C34 anterior cervical decompression with fusion plating and bonegraft   BACK SURGERY  1/15   lumb fusion   CARDIAC CATHETERIZATION  2014   CARPAL TUNNEL RELEASE Right 03/23/2014   Procedure: RIGHT CARPAL TUNNEL RELEASE;  Surgeon: Tennis Must, MD;  Location: [place];  Service: Orthopedics;  Laterality: Right;   COLONOSCOPY  2003, 2015   LEFT AND RIGHT HEART CATHETERIZATION WITH CORONARY ANGIOGRAM N/A 03/11/2013   Procedure: LEFT AND RIGHT HEART CATHETERIZATION WITH CORONARY ANGIOGRAM;  Surgeon: Laverda Page, MD;  Location: [facility] CATH LAB;  Service: Cardiovascular;  Laterality: N/A;   LUMBAR WOUND DEBRIDEMENT N/A 11/07/2013   Procedure: LUMBAR WOUND DEBRIDEMENT;  Surgeon: Winfield Cunas, MD;  Location: [facility];  Service: Neurosurgery;  Laterality: N/A;   TONSILLECTOMY  1958   Social History   Tobacco Use   Smoking status: Former    Packs/day: 0.50    Years: 0.00    Pack years: 0.00    Types: Cigarettes    Quit date: 08/14/1995    Years since quitting: 26.1   Smokeless tobacco: Never  Substance Use Topics   Alcohol use: No    Alcohol/week: 0.0 standard drinks   Marital Status: Married    ROS   Review of Systems  Cardiovascular:  Positive for leg swelling. Negative for chest pain and dyspnea on exertion.  Respiratory:  Positive for wheezing.   Musculoskeletal:  Positive for arthritis, back pain and joint pain.  Gastrointestinal:  Negative for melena.   Objective  Blood pressure 140/81, pulse 79, temperature 98.3 F (36.8 C), temperature source Temporal, resp. rate 17, height 6' 2"  (1.88 m), weight 193 lb 12.8 oz (87.9 kg), SpO2 100 %. Body mass index is 24.88 kg/m.  Vitals with BMI 09/28/2021 08/17/2021 08/05/2021  Height 6' 2"  6' 2"  -  Weight 193 lbs 13 oz 196 lbs 6 oz -  BMI 34.91 79.15 -  Systolic 056 979 480  Diastolic 81 75 93  Pulse 79 92 92      Physical Exam Constitutional:      Appearance: He is well-developed.  Neck:     Thyroid: No thyromegaly.     Vascular: No carotid bruit or JVD.  Cardiovascular:     Rate and Rhythm: Normal rate and regular rhythm.     Pulses: Intact distal pulses.     Heart sounds: Normal heart sounds. No murmur heard.   No gallop.  Pulmonary:     Effort: Pulmonary effort is normal.     Breath sounds: Normal breath sounds.  Abdominal:     General: Bowel sounds are normal.     Palpations: Abdomen is soft.  Musculoskeletal:     Right lower leg: Edema (2 + pitting) present.     Left lower leg: Edema (2 + pitting) present.   Radiology: No results found.  Laboratory examination:   BMP Latest Ref Rng & Units 09/06/2021 08/05/2021 08/04/2021  Glucose 70 - 99 mg/dL 95 84 85  BUN 8 - 27 mg/dL 28(H) 36(H) 42(H)  Creatinine 0.76 - 1.27 mg/dL 1.83(H) 1.33(H) 1.61(H)  BUN/Creat Ratio 10 - 24 15 - -  Sodium 134 - 144 mmol/L 142 132(L) 133(L)  Potassium 3.5 - 5.2 mmol/L 4.7 3.8 4.0  Chloride 96 - 106 mmol/L 104 104 104  CO2 20 - 29 mmol/L 24 19(L) 22  Calcium 8.6 - 10.2 mg/dL 10.0 8.7(L) 9.0  CrCl cannot be calculated (Patient's most recent lab result is older than the maximum 21 days allowed.).    CBC Latest Ref Rng & Units 08/04/2021 08/03/2021 08/01/2021  WBC 4.0 - 10.5 K/uL 3.0(L) 2.8(L) 3.8(L)  Hemoglobin 13.0 - 17.0 g/dL 10.9(L) 11.0(L) 12.5(L)  Hematocrit 39.0 - 52.0 % 32.4(L) 32.9(L) 38.0(L)  Platelets 150 - 400 K/uL 143(L) 144(L) 176    External labs:  09/28/2021:  Potassium 4.5, serum creatinine 1.2, EGFR 72.6.  Hb 9.9  Labs 09/09/2021:  Serum glucose 95 mg, BUN 30, creatinine 1.5, EGFR 56 mL, potassium 5.1.  Labs 09/05/2021:  BUN 37, creatinine 2.1, EGFR 38 mL.  Cholesterol, total 147.000 m 03/08/2021 HDL 49.000 mg 03/08/2021 LDL 73.000 mg 03/08/2021 Triglycerides 124.000 m 03/08/2021  A1C 5.400 % 03/08/2021 TSH 14.500 03/08/2021  Medications   Current Outpatient Medications on File Prior to Visit  Medication  Sig Dispense Refill   acetaminophen (TYLENOL) 500 MG tablet Take 1,000 mg by mouth every 6 (six) hours as needed for moderate pain.     albuterol (PROVENTIL) (5 MG/ML) 0.5% nebulizer solution Take 2.5 mg by nebulization every 4 (four) hours as needed for wheezing or shortness of breath.     Ascorbic Acid (VITAMIN C) 500 MG CAPS Take 1,000 mg by mouth daily.     cetirizine (ZYRTEC) 10 MG tablet Take 10 mg by mouth daily as needed for allergies or rhinitis.     docusate sodium (COLACE) 100 MG capsule Take 400 mg by mouth daily as needed for mild constipation.     furosemide (LASIX) 20 MG tablet Take 20 mg by mouth daily as needed for fluid.     hydrALAZINE (APRESOLINE) 50 MG tablet Take 1 tablet (50 mg total) by mouth 3 (three) times daily. 90 tablet 2   ipratropium-albuterol (DUONEB) 0.5-2.5 (3) MG/3ML SOLN Take 3 mLs by nebulization every 4 (four) hours. (Patient taking differently: Take 3 mLs by nebulization every 4 (four) hours as needed (Shortness of breath).) 360 mL 0   levothyroxine (SYNTHROID) 150 MCG tablet Take 150 mcg by mouth daily before breakfast.     metoprolol succinate (TOPROL-XL) 100 MG 24 hr tablet Take 100 mg by mouth daily.       montelukast (SINGULAIR) 10 MG tablet Take 10 mg by mouth at bedtime.     oxyCODONE (ROXICODONE) 5 MG immediate release tablet Take 1 tablet (5 mg total) by mouth every 4 (four) hours as needed for severe pain. (Patient taking differently: Take 10 mg by mouth 2 (two) times daily.) 45 tablet 0   No current facility-administered medications on file prior to visit.     Cardiac Studies:   Lower extremity venous insufficiency study 06/20/2011:  No evidence of venous insufficiency.  No evidence of DVT.  Cardiac catheterization 03/11/2013: Ejection fraction 15%, global hypokinesis, mild luminal irregularity. Normal right heart pressure.. EF 07/14/13: 20-25% Improved on Medical therapy.  CT angiogram abdomen 2019 right renal artery patent, moderate disease left renal artery.   PCV ECHOCARDIOGRAM COMPLETE 07/05/2021  Narrative Echocardiogram 07/05/2021: Left ventricle cavity is normal in size. Moderate concentric hypertrophy of the left ventricle. Normal global wall motion. Normal LV systolic function with EF 55%. Doppler evidence of grade I (impaired) diastolic dysfunction, normal LAP. Moderate (Grade II) mitral regurgitation. Mild to moderate tricuspid regurgitation. Peak RA-RV gradient 19 mmHg. IVC is not seen. Mild pulmonic regurgitation. The aortic root is upper limit normal at 3.8 cm. Previous study on 06/21/2020 measured aortic root at 4.2 cm, and estimated PASP at 34 mmHg. Consider CT imaging of aorta, if clinically indicated.     EKG:   EKG 06/27/2021: Normal sinus rhythm at rate of 73 bpm, normal axis.  Nonspecific T abnormality.no significant change from 06/27/2019.     Assessment     ICD-10-CM   1. Renal artery stenosis (HCC) CT angiogram 2019 right renal artery patent, moderate disease left renal artery.  I70.1     2. Renovascular hypertension  I15.0     3. Chronic diastolic (congestive) heart failure (HCC)  I50.32     4. Primary hypertension  I10      Recommendations:    Peter [name]  Is a 71 y.o. African-American male with a history of nonischemic cardiomyopathy with severe LV systolic dysfunction, cardiac catheterization  03/11/2013 had revealed no significant coronary artery disease, ejection fraction 25%, normalization of LVEF by medical therapy.  He has  history of hypertension, very mild hyperlipidemia, mild aortic root dilatation, OSA on CPAP, statins were discontinued as he had had markedly elevated CK enzymes and rhabdomyolysis, radioactive iodine therapy for hyperthyroidismin 2014 and suspect hyperthyroidism to be the etiology for his rhabdomyolysis.   Patient has been having frequent episodes of increased leg edema and also uncontrolled hypertension, ARB therapy led to acute renal failure.  Fortunately all his renal function has returned back to normal, I was able to speak to Dr. Keane Police office today and his labs done this morning also revealed renal function to be completely back to baseline normal.  He has renal artery stenosis on the left by prior CT of the abdomen in 2019.  In view of uncontrolled hypertension, renal artery stenosis is probably high on the list.  However today's blood pressure is much better improved and he is also watching his diet very carefully.  As his blood pressure is controlled on the present medications, he is on maximum tolerated beta-blocker therapy, hydralazine 50 mg 3 times daily, Lasix.  Since last office visit he has lost 6 pounds in weight and has been more careful with his diet.  Continue the same for now, not a candidate for amlodipine or ARB or ACE inhibitor's due to leg edema and renal failure respectively.  I would like to see him back in 3 months, patient is aware that if his blood pressure goes beyond 140/80 mmHg on a constant basis, he will inform me so I can order for abdominal MRI with renal protocol without contrast.     Adrian Prows, MD, [facility] 09/28/2021, 5:28 PM Office: [phone]

## 2021-09-30 DIAGNOSIS — E785 Hyperlipidemia, unspecified: Secondary | ICD-10-CM | POA: Diagnosis not present

## 2021-09-30 DIAGNOSIS — R739 Hyperglycemia, unspecified: Secondary | ICD-10-CM | POA: Diagnosis not present

## 2021-09-30 DIAGNOSIS — I130 Hypertensive heart and chronic kidney disease with heart failure and stage 1 through stage 4 chronic kidney disease, or unspecified chronic kidney disease: Secondary | ICD-10-CM | POA: Diagnosis not present

## 2021-09-30 DIAGNOSIS — N401 Enlarged prostate with lower urinary tract symptoms: Secondary | ICD-10-CM | POA: Diagnosis not present

## 2021-09-30 DIAGNOSIS — E538 Deficiency of other specified B group vitamins: Secondary | ICD-10-CM | POA: Diagnosis not present

## 2021-09-30 DIAGNOSIS — I5032 Chronic diastolic (congestive) heart failure: Secondary | ICD-10-CM | POA: Diagnosis not present

## 2021-09-30 DIAGNOSIS — G894 Chronic pain syndrome: Secondary | ICD-10-CM | POA: Diagnosis not present

## 2021-09-30 DIAGNOSIS — N182 Chronic kidney disease, stage 2 (mild): Secondary | ICD-10-CM | POA: Diagnosis not present

## 2021-09-30 DIAGNOSIS — E039 Hypothyroidism, unspecified: Secondary | ICD-10-CM | POA: Diagnosis not present

## 2021-09-30 DIAGNOSIS — M1990 Unspecified osteoarthritis, unspecified site: Secondary | ICD-10-CM | POA: Diagnosis not present

## 2021-09-30 DIAGNOSIS — R972 Elevated prostate specific antigen [PSA]: Secondary | ICD-10-CM | POA: Diagnosis not present

## 2021-09-30 DIAGNOSIS — J45909 Unspecified asthma, uncomplicated: Secondary | ICD-10-CM | POA: Diagnosis not present

## 2021-10-02 ENCOUNTER — Other Ambulatory Visit: Payer: Self-pay | Admitting: Cardiology

## 2021-10-02 DIAGNOSIS — I10 Essential (primary) hypertension: Secondary | ICD-10-CM

## 2021-10-02 DIAGNOSIS — I77810 Thoracic aortic ectasia: Secondary | ICD-10-CM

## 2021-10-21 ENCOUNTER — Telehealth: Payer: Self-pay

## 2021-10-21 DIAGNOSIS — I701 Atherosclerosis of renal artery: Secondary | ICD-10-CM

## 2021-10-21 DIAGNOSIS — I5032 Chronic diastolic (congestive) heart failure: Secondary | ICD-10-CM

## 2021-10-21 NOTE — Telephone Encounter (Signed)
Pt called and stated that he has been taking his medications as directed and wearing his compression stockings. Pt stated the swelling in his feet and legs has gotten worse. He is unsure of what else he can do and if maybe the heart failure and kidney function has gotten worse. Please advise.

## 2021-10-21 NOTE — Telephone Encounter (Signed)
ICD-10-CM   1. Renal artery stenosis (HCC)  I70.1 MR ABDOMEN MRCP WO CONTRAST    2. Chronic diastolic (congestive) heart failure (HCC)  P94.32 Basic metabolic panel    Brain natriuretic peptide     Orders Placed This Encounter  Procedures   MR ABDOMEN MRCP WO CONTRAST    Renal protocol for renal artery stenosis    Standing Status:   Future    Standing Expiration Date:   10/21/2022    Order Specific Question:   What is the patient's sedation requirement?    Answer:   No Sedation    Order Specific Question:   Does the patient have a pacemaker or implanted devices?    Answer:   No    Order Specific Question:   Preferred imaging location?    Answer:   [facility] (table limit-550lbs)    Order Specific Question:   Call Results- Best Contact Number?    Answer:   [phone]   Basic metabolic panel    Standing Status:   Future    Standing Expiration Date:   10/21/2022   Brain natriuretic peptide    Standing Status:   Future    Standing Expiration Date:   10/21/2022    Adrian Prows, MD, [facility] 10/21/2021, 3:03 PM Office: [phone] Fax: [phone] Pager: [phone]

## 2021-10-21 NOTE — Telephone Encounter (Signed)
Called and spoke to pt, pt voiced understanding and will get labs done Monday. Pt agreed to pay more attention to his diet and will be waiting for a call from radiology.

## 2021-10-23 DIAGNOSIS — M1990 Unspecified osteoarthritis, unspecified site: Secondary | ICD-10-CM | POA: Diagnosis not present

## 2021-10-23 DIAGNOSIS — I130 Hypertensive heart and chronic kidney disease with heart failure and stage 1 through stage 4 chronic kidney disease, or unspecified chronic kidney disease: Secondary | ICD-10-CM | POA: Diagnosis not present

## 2021-10-23 DIAGNOSIS — N182 Chronic kidney disease, stage 2 (mild): Secondary | ICD-10-CM | POA: Diagnosis not present

## 2021-10-23 DIAGNOSIS — E785 Hyperlipidemia, unspecified: Secondary | ICD-10-CM | POA: Diagnosis not present

## 2021-10-24 DIAGNOSIS — I10 Essential (primary) hypertension: Secondary | ICD-10-CM | POA: Diagnosis not present

## 2021-10-25 LAB — BASIC METABOLIC PANEL
BUN/Creatinine Ratio: 13 (ref 10–24)
BUN: 15 mg/dL (ref 8–27)
CO2: 24 mmol/L (ref 20–29)
Calcium: 9.1 mg/dL (ref 8.6–10.2)
Chloride: 105 mmol/L (ref 96–106)
Creatinine, Ser: 1.15 mg/dL (ref 0.76–1.27)
Glucose: 96 mg/dL (ref 70–99)
Potassium: 4.3 mmol/L (ref 3.5–5.2)
Sodium: 141 mmol/L (ref 134–144)
eGFR: 68 mL/min/{1.73_m2} (ref >59)

## 2021-10-27 ENCOUNTER — Other Ambulatory Visit: Payer: Self-pay | Admitting: Student

## 2021-10-27 DIAGNOSIS — I701 Atherosclerosis of renal artery: Secondary | ICD-10-CM

## 2021-11-15 ENCOUNTER — Ambulatory Visit
Admission: RE | Admit: 2021-11-15 | Discharge: 2021-11-15 | Disposition: A | Discharge status: Home / Self Care | Payer: Medicare HMO | Source: Ambulatory Visit | Attending: Student | Admitting: Student

## 2021-11-15 ENCOUNTER — Telehealth: Payer: Self-pay

## 2021-11-15 ENCOUNTER — Encounter: Payer: Self-pay | Admitting: Cardiology

## 2021-11-15 ENCOUNTER — Other Ambulatory Visit: Payer: Self-pay

## 2021-11-15 DIAGNOSIS — I701 Atherosclerosis of renal artery: Secondary | ICD-10-CM

## 2021-11-15 DIAGNOSIS — N3289 Other specified disorders of bladder: Secondary | ICD-10-CM | POA: Diagnosis not present

## 2021-11-15 DIAGNOSIS — N1330 Unspecified hydronephrosis: Secondary | ICD-10-CM | POA: Diagnosis not present

## 2021-11-15 NOTE — Telephone Encounter (Signed)
[facility] radiology called and stated that the pts MRI Abdomen at [facility] imaging has been finalized. They wanted to make you aware that it was pretty lengthy and wanted to make sure you could see it.

## 2021-11-15 NOTE — Progress Notes (Signed)
Called and left a voicemail with results and advised patient to seek evaluation in the ED.

## 2021-11-15 NOTE — Progress Notes (Signed)
I also sent a MyChart message for the patient.

## 2021-11-16 ENCOUNTER — Other Ambulatory Visit: Payer: Self-pay

## 2021-11-16 ENCOUNTER — Telehealth: Payer: Self-pay

## 2021-11-16 ENCOUNTER — Emergency Department (HOSPITAL_BASED_OUTPATIENT_CLINIC_OR_DEPARTMENT_OTHER)
Admission: EM | Admit: 2021-11-16 | Discharge: 2021-11-16 | Disposition: A | Discharge status: Home / Self Care | Payer: Medicare HMO | Attending: Emergency Medicine | Admitting: Emergency Medicine

## 2021-11-16 ENCOUNTER — Encounter (HOSPITAL_BASED_OUTPATIENT_CLINIC_OR_DEPARTMENT_OTHER): Payer: Self-pay

## 2021-11-16 DIAGNOSIS — N3289 Other specified disorders of bladder: Secondary | ICD-10-CM | POA: Diagnosis not present

## 2021-11-16 DIAGNOSIS — N320 Bladder-neck obstruction: Secondary | ICD-10-CM | POA: Insufficient documentation

## 2021-11-16 DIAGNOSIS — N189 Chronic kidney disease, unspecified: Secondary | ICD-10-CM | POA: Diagnosis not present

## 2021-11-16 DIAGNOSIS — Z79899 Other long term (current) drug therapy: Secondary | ICD-10-CM | POA: Diagnosis not present

## 2021-11-16 LAB — URINALYSIS, ROUTINE W REFLEX MICROSCOPIC
Bilirubin Urine: NEGATIVE
Glucose, UA: NEGATIVE mg/dL
Hgb urine dipstick: NEGATIVE
Ketones, ur: NEGATIVE mg/dL
Leukocytes,Ua: NEGATIVE
Nitrite: NEGATIVE
Protein, ur: NEGATIVE mg/dL
Specific Gravity, Urine: 1.013 (ref 1.005–1.030)
pH: 6.5 (ref 5.0–8.0)

## 2021-11-16 NOTE — ED Notes (Signed)
Pt was discharged with foley cath in place. Pt is going to follow up with his Urologist per discharge orders.

## 2021-11-16 NOTE — Discharge Instructions (Addendum)
You should be seen by one of the urologist office in the next 2 to 3 weeks.  Your Foley catheter should not stay in for more than 4 weeks without being exchanged.  If you begin having fevers or chills, or noticed that your urine turns very cloudy or foul-smelling, you need to come back to the ER.  These may be signs of an infection.

## 2021-11-16 NOTE — ED Notes (Signed)
Multiple Bladder Scans: >999 mL.

## 2021-11-16 NOTE — ED Provider Notes (Signed)
[facility] EMERGENCY DEPT Provider Note   CSN: [id number] Arrival date & time: 11/16/21  1744     History  Chief Complaint  Patient presents with   Chronic Kidney Disease    Peter [name] is a 71 y.o. male present emergency department with concern for bladder obstruction.  Patient reports that he has been urinating small amounts frequently for the past several months.  He is unaware of any issues of large prostate.  He says that he had an MRI done in the past day or 2 and was called today and told he needs to come to the ER because his bladder was markedly distended.  He denies any abdominal pain.  He denies any fevers.  He is here with his wife.  HPI     Home Medications Prior to Admission medications   Medication Sig Start Date End Date Taking? Authorizing Provider  acetaminophen (TYLENOL) 500 MG tablet Take 1,000 mg by mouth every 6 (six) hours as needed for moderate pain.    [provider]  albuterol (PROVENTIL) (5 MG/ML) 0.5% nebulizer solution Take 2.5 mg by nebulization every 4 (four) hours as needed for wheezing or shortness of breath. 11/03/20   [provider]  Ascorbic Acid (VITAMIN C) 500 MG CAPS Take 1,000 mg by mouth daily.    [provider]  cetirizine (ZYRTEC) 10 MG tablet Take 10 mg by mouth daily as needed for allergies or rhinitis.    [provider]  docusate sodium (COLACE) 100 MG capsule Take 400 mg by mouth daily as needed for mild constipation.    [provider]  furosemide (LASIX) 20 MG tablet Take 20 mg by mouth daily as needed for fluid.    [provider]  hydrALAZINE (APRESOLINE) 50 MG tablet Take 1 tablet (50 mg total) by mouth 3 (three) times daily. 08/26/21 11/24/21  Adrian Prows, MD  ipratropium-albuterol (DUONEB) 0.5-2.5 (3) MG/3ML SOLN Take 3 mLs by nebulization every 4 (four) hours. Patient taking differently: Take 3 mLs by nebulization every 4 (four) hours as needed (Shortness of  breath). 06/05/17   Debbe Odea, MD  levothyroxine (SYNTHROID) 150 MCG tablet Take 150 mcg by mouth daily before breakfast.    [provider]  metoprolol succinate (TOPROL-XL) 100 MG 24 hr tablet Take 100 mg by mouth daily.  02/06/18   [provider]  montelukast (SINGULAIR) 10 MG tablet Take 10 mg by mouth at bedtime. 05/07/17   [provider]  oxyCODONE (ROXICODONE) 5 MG immediate release tablet Take 1 tablet (5 mg total) by mouth every 4 (four) hours as needed for severe pain. Patient taking differently: Take 10 mg by mouth 2 (two) times daily. 04/18/15   Kritzer, Louie Casa, MD      Allergies    Benicar [olmesartan], Keflex [cephalexin], Amlodipine, and Aspirin    Review of Systems   Review of Systems  Physical Exam Updated Vital Signs BP (!) 154/91    Pulse 75    Temp 98.7 F (37.1 C)    Resp 18    Ht 6\' 2"  (1.88 m)    Wt 87.9 kg    SpO2 100%    BMI 24.88 kg/m  Physical Exam Constitutional:      General: He is not in acute distress. HENT:     Head: Normocephalic and atraumatic.  Eyes:     Conjunctiva/sclera: Conjunctivae normal.     Pupils: Pupils are equal, round, and reactive to light.  Cardiovascular:  Rate and Rhythm: Normal rate and regular rhythm.  Pulmonary:     Effort: Pulmonary effort is normal. No respiratory distress.  Abdominal:     General: There is no distension.     Tenderness: There is no abdominal tenderness.  Genitourinary:    Comments: Foley catheter inserted, draining yellow urine Skin:    General: Skin is warm and dry.  Neurological:     General: No focal deficit present.     Mental Status: He is alert. Mental status is at baseline.  Psychiatric:        Mood and Affect: Mood normal.        Behavior: Behavior normal.    ED Results / Procedures / Treatments   Labs (all labs ordered are listed, but only abnormal results are displayed) Labs Reviewed  URINALYSIS, ROUTINE W REFLEX MICROSCOPIC     EKG None  Radiology MR ANGIO ABDOMEN WO CONTRAST  Result Date: 11/15/2021 CLINICAL DATA:  Evaluate for renal artery stenosis. Noncontrast examination performed secondary to acute on chronic renal insufficiency. EXAM: MRI ABDOMEN WITHOUT AND WITH CONTRAST TECHNIQUE: Multiplanar multisequence MR imaging of the abdomen was performed both before and after the administration of intravenous contrast. CONTRAST:  None, given patient's renal insufficiency COMPARISON:  Chest CT-07/20/2021 CT abdomen pelvis-04/11/2016 FINDINGS: The lack of intravenous contrast limits the ability to fully evaluate abdominal organs as well degrades evaluation of the abdominal vasculature. Lower chest: No discrete focal airspace opacities. No pleural effusion. Hepatobiliary: Normal hepatic contour. Note is made of an approximately 1.3 cm T2 intense lesion within the dome of the right lobe of the liver (image 11, series 6), incompletely characterized without intravenous contrast though favored to represent a hepatic cyst. No ascites. Pancreas:  Normal noncontrast appearance of the pancreas. Spleen: Normal noncontrast appearance of the spleen. No evidence of splenomegaly. Adrenals/Urinary Tract: When compared to chest CT performed 06/2021, there has been development of severe left-sided pelvicaliectasis and ureterectasis to the level of the urinary bladder. This finding is associated with marked distension of the urinary bladder which appears trabeculated about its dome and extends above the level of the umbilicus. No evidence of right-sided urinary obstruction. No evidence of asymmetric renal atrophy. Note is made of bilateral T2 intense renal lesions, the largest of which within the superior pole of the left kidney measures 3.2 cm (image 7, series 4) and the largest of which within the right kidney measures 1.3 cm (image 9, series 4), all of which are incompletely characterized on present examination though favored to represent renal  cysts. Additional subcentimeter T2 intense renal lesions are too small to accurately characterize though favored to represent additional renal cysts. Stomach/Bowel: Large colonic stool burden without evidence of enteric obstruction. No significant hiatal hernia. Vascular/Lymphatic: Normal caliber of the abdominal aorta. The IVC and pelvic venous systems appear widely patent. The left renal artery is again noted to be duplicated with tiny accessory left renal artery which supplies the superior pole the left kidney. The origin of the right renal artery is noted to be located somewhat anteriorly along the abdominal aorta. Evaluation for hemodynamically significant narrowing is degraded secondary to lack of intravenous contrast. Musculoskeletal: No definitive acute or aggressive osseous abnormalities on this noncontrast examination. Moderate DDD of L4-L5 with disc space height loss, endplate irregularity and sclerosis. IMPRESSION: 1. Severe distention of the urinary bladder (extending superior to the umbilicus), with associated asymmetric moderate to severe left-sided ureterectasis and pelvicaliectasis, constellation of findings worrisome for bladder outlet obstruction with asymmetric obstruction  of the left ureter, likely the etiology of reported history of acute on chronic renal insufficiency. Urologic consultation is advised. 2. No evidence of right-sided urinary obstruction. 3. Degraded examination secondary to lack of intravenous contrast, however there are no secondary findings of renal artery stenosis, specifically, no asymmetric renal atrophy. If clinical concern persists for renal artery stenosis, further evaluation with renal Doppler ultrasound could be performed as indicated. Alternatively, if patient's renal function improves following urinary diversion, repeat CTA of the abdomen pelvis could be performed as indicated. These results will be called to the ordering clinician or representative by the  Radiologist Assistant, and communication documented in the PACS or [employer]. Electronically Signed   By: Sandi Mariscal M.D.   On: 11/15/2021 15:04    Procedures Procedures    Medications Ordered in ED Medications - No data to display  ED Course/ Medical Decision Making/ A&P                           Medical Decision Making Amount and/or Complexity of Data Reviewed Labs: ordered.   Patient is here with suspected bladder outlet obstruction.  Initial bladder scan showed over a liter of fluid in the bladder.  An indwelling Foley catheter was placed and the bladder was drained.  The patient is comfortable my exam, no evidence of bladder perforation or sepsis.  No sign of infection.  UA does not show evidence of infection.  I advised that we maintain the Foley catheter for that now, the patient has had 1 before, we can reeducate him to use at home.  Advised to follow-up with urologist information was provided.  He verbalized understanding.        Final Clinical Impression(s) / ED Diagnoses Final diagnoses:  Bladder distention  Bladder obstruction    Rx / DC Orders ED Discharge Orders     None         Wyvonnia Dusky, MD 11/17/21 0930

## 2021-11-16 NOTE — Telephone Encounter (Signed)
Patient called about his MRI results and read your message in MyChart and is worried now and wants you to give him a call ASAP.

## 2021-11-16 NOTE — ED Notes (Signed)
16 Fr Foley Catheter inserted with very Minimal Resistance by Roselie Awkward, NT with assistance from this RN. Patient tolerated procedure well.

## 2021-11-16 NOTE — ED Triage Notes (Signed)
Patient here POV from Home with CKD and Possible Bladder Obstruction.  Patient was admitted in Late October for PNA and AKI. Patient was discharged and instructed to Follow up. MRI ABD was completed yesterday and due to abnormal results the patient seeks ED Evaluation.   No Outright Urinary Symptoms. No Fevers. Endorses General Malaise. No Pain.  NAD noted during Triage. A&Ox4. GCS 15. Ambulatory.

## 2021-11-16 NOTE — Telephone Encounter (Signed)
I called the patient, again left a message stating that he needs to emergently have his bladder catheterized due to acute renal failure, obstructive uropathy and needs urgent evaluation by urology as well.  I was unable to get hold of him, left a message on his phone.

## 2021-12-07 DIAGNOSIS — R338 Other retention of urine: Secondary | ICD-10-CM | POA: Diagnosis not present

## 2021-12-07 DIAGNOSIS — R339 Retention of urine, unspecified: Secondary | ICD-10-CM | POA: Diagnosis not present

## 2021-12-19 DIAGNOSIS — N182 Chronic kidney disease, stage 2 (mild): Secondary | ICD-10-CM | POA: Diagnosis not present

## 2021-12-19 DIAGNOSIS — N319 Neuromuscular dysfunction of bladder, unspecified: Secondary | ICD-10-CM | POA: Diagnosis not present

## 2021-12-19 DIAGNOSIS — I5032 Chronic diastolic (congestive) heart failure: Secondary | ICD-10-CM | POA: Diagnosis not present

## 2021-12-19 DIAGNOSIS — Z978 Presence of other specified devices: Secondary | ICD-10-CM | POA: Diagnosis not present

## 2021-12-19 DIAGNOSIS — I5022 Chronic systolic (congestive) heart failure: Secondary | ICD-10-CM | POA: Diagnosis not present

## 2021-12-19 DIAGNOSIS — R627 Adult failure to thrive: Secondary | ICD-10-CM | POA: Diagnosis not present

## 2021-12-19 DIAGNOSIS — G894 Chronic pain syndrome: Secondary | ICD-10-CM | POA: Diagnosis not present

## 2021-12-19 DIAGNOSIS — I130 Hypertensive heart and chronic kidney disease with heart failure and stage 1 through stage 4 chronic kidney disease, or unspecified chronic kidney disease: Secondary | ICD-10-CM | POA: Diagnosis not present

## 2021-12-19 DIAGNOSIS — N401 Enlarged prostate with lower urinary tract symptoms: Secondary | ICD-10-CM | POA: Diagnosis not present

## 2021-12-22 DIAGNOSIS — R339 Retention of urine, unspecified: Secondary | ICD-10-CM | POA: Diagnosis not present

## 2021-12-26 NOTE — Progress Notes (Signed)
? ?Primary Physician/Referring:  Shon Baton, MD ? ?Patient ID: Peter [name], male    DOB: [birth year], 71 y.o.   MRN: [id number] ? ?Chief Complaint  ?Patient presents with  ? Hypertension  ? Chronic Renal Insufficiency  ? Follow-up  ?  3 months  ? ?HPI:   ? ?HPI: Peter [name]  is a 71 y.o. male  AAfrican-American male with a history of nonischemic cardiomyopathy with severe LV systolic dysfunction, cardiac catheterization  03/11/2013 had revealed no significant coronary artery disease, ejection fraction 25%, normalization of LVEF by medical therapy. ? ?He has history of hypertension, very mild hyperlipidemia, mild aortic root dilatation, OSA on CPAP, statins were discontinued as he had had markedly elevated CK enzymes and rhabdomyolysis, radioactive iodine therapy for hyperthyroidismin 2014 and suspect hyperthyroidism to be the etiology for his rhabdomyolysis.  ? ?Patient has been having frequent episodes of increased leg edema and also uncontrolled hypertension, ARB therapy led to acute renal failure.  ? ?I performed MR angiogram of the abdomen without contrast on 11/15/2021 which revealed severe dilatation and distention of the urinary bladder extending superior to the umbilicus associated with obstructive uropathy and hydronephrosis.  He was urgently referred for urology evaluation, since then has been on Foley catheter with complete resolution of edema, resolution of renal failure, blood pressure is now well controlled.  He now presents for a 2-monthoffice visit and follow-up, essentially asymptomatic except for dizziness and fatigue when he takes tamsulosin. ? ?Social History  ? ?Tobacco Use  ? Smoking status: Former  ?  Packs/day: 0.50  ?  Years: 0.00  ?  Pack years: 0.00  ?  Types: Cigarettes  ?  Quit date: 08/14/1995  ?  Years since quitting: 26.3  ? Smokeless tobacco: Never  ?Substance Use Topics  ? Alcohol use: No  ?  Alcohol/week: 0.0 standard drinks  ?Marital Status: Married  ?  ?ROS  ? ?Review  of Systems  ?Cardiovascular:  Negative for chest pain, dyspnea on exertion and leg swelling.  ? ?Objective  ?Blood pressure 120/67, pulse 82, temperature (!) 97.3 ?F (36.3 ?C), temperature source Temporal, resp. rate 14, height 6' 2"  (1.88 m), weight 190 lb 9.6 oz (86.5 kg), SpO2 96 %. Body mass index is 24.47 kg/m?.  ? ?  12/28/2021  ?  3:33 PM 11/16/2021  ?  8:00 PM 11/16/2021  ?  7:40 PM  ?Vitals with BMI  ?Height 6' 2"     ?Weight 190 lbs 10 oz    ?BMI 24.46    ?Systolic [id number] ?Diastolic 67 91 90  ?Pulse 82 75 62  ?  ? ? Physical Exam ?Constitutional:   ?   Appearance: He is well-developed.  ?Neck:  ?   Thyroid: No thyromegaly.  ?   Vascular: No JVD.  ?Cardiovascular:  ?   Rate and Rhythm: Normal rate and regular rhythm.  ?   Pulses: Intact distal pulses.  ?   Heart sounds: Normal heart sounds. No murmur heard. ?  No gallop.  ?Pulmonary:  ?   Effort: Pulmonary effort is normal.  ?   Breath sounds: Normal breath sounds.  ?Abdominal:  ?   General: Bowel sounds are normal.  ?   Palpations: Abdomen is soft.  ?Musculoskeletal:  ?   Right lower leg: No edema.  ?   Left lower leg: No edema.  ? ?Radiology: ?No results found. ? ?Laboratory examination:  ? ? ?  Latest Ref Rng & Units 10/24/2021  ?  3:39  PM 09/06/2021  ?  3:30 PM 08/05/2021  ?  4:50 AM  ?BMP  ?Glucose 70 - 99 mg/dL 96   95   84    ?BUN 8 - 27 mg/dL 15   28   36    ?Creatinine 0.76 - 1.27 mg/dL 1.15   1.83   1.33    ?BUN/Creat Ratio 10 - 24 13   15      ?Sodium 134 - 144 mmol/L 141   142   132    ?Potassium 3.5 - 5.2 mmol/L 4.3   4.7   3.8    ?Chloride 96 - 106 mmol/L 105   104   104    ?CO2 20 - 29 mmol/L 24   24   19     ?Calcium 8.6 - 10.2 mg/dL 9.1   10.0   8.7    ?CrCl cannot be calculated (Patient's most recent lab result is older than the maximum 21 days allowed.).  ? ? ?  Latest Ref Rng & Units 08/04/2021  ?  5:13 AM 08/03/2021  ?  5:11 AM 08/01/2021  ?  8:50 PM  ?CBC  ?WBC 4.0 - 10.5 K/uL 3.0   2.8   3.8    ?Hemoglobin 13.0 - 17.0 g/dL 10.9   11.0    12.5    ?Hematocrit 39.0 - 52.0 % 32.4   32.9   38.0    ?Platelets 150 - 400 K/uL 143   144   176    ?  ?External labs: ? ?Labs 12/19/2021: ? ?BUN 13, creatinine 0.8, EGFR 115 mL, potassium 4.3. ? ?Hb 11.5/HCT 34.0, platelets 181. ? ?Cholesterol, total 147.000 m 03/08/2021 ?HDL 49.000 mg 03/08/2021 ?LDL 73.000 mg 03/08/2021 ?Triglycerides 124.000 m 03/08/2021 ? ?A1C 5.400 % 03/08/2021 ?TSH 14.500 03/08/2021 ? ?Medications  ? ?Current Outpatient Medications:  ?  acetaminophen (TYLENOL) 500 MG tablet, Take 1,000 mg by mouth every 6 (six) hours as needed for moderate pain., Disp: , Rfl:  ?  albuterol (PROVENTIL) (5 MG/ML) 0.5% nebulizer solution, Take 2.5 mg by nebulization every 4 (four) hours as needed for wheezing or shortness of breath., Disp: , Rfl:  ?  cetirizine (ZYRTEC) 10 MG tablet, Take 10 mg by mouth daily as needed for allergies or rhinitis., Disp: , Rfl:  ?  docusate sodium (COLACE) 100 MG capsule, Take 400 mg by mouth daily as needed for mild constipation., Disp: , Rfl:  ?  furosemide (LASIX) 20 MG tablet, Take 20 mg by mouth daily as needed for fluid., Disp: , Rfl:  ?  ipratropium-albuterol (DUONEB) 0.5-2.5 (3) MG/3ML SOLN, Take 3 mLs by nebulization every 4 (four) hours. (Patient taking differently: Take 3 mLs by nebulization every 4 (four) hours as needed (Shortness of breath).), Disp: 360 mL, Rfl: 0 ?  levothyroxine (SYNTHROID) 150 MCG tablet, Take 150 mcg by mouth daily before breakfast., Disp: , Rfl:  ?  metoprolol succinate (TOPROL-XL) 100 MG 24 hr tablet, Take 100 mg by mouth daily. , Disp: , Rfl:  ?  montelukast (SINGULAIR) 10 MG tablet, Take 10 mg by mouth at bedtime., Disp: , Rfl:  ?  oxyCODONE (ROXICODONE) 5 MG immediate release tablet, Take 1 tablet (5 mg total) by mouth every 4 (four) hours as needed for severe pain. (Patient taking differently: Take 10 mg by mouth 2 (two) times daily.), Disp: 45 tablet, Rfl: 0 ?  tamsulosin (FLOMAX) 0.4 MG CAPS capsule, Take 0.4 mg by mouth at bedtime., Disp: ,  Rfl:  ?  MOVANTIK  25 MG TABS tablet, Take 25 mg by mouth daily., Disp: , Rfl:  ?  valsartan-hydrochlorothiazide (DIOVAN-HCT) 160-25 MG tablet, Take 0.5 tablets by mouth every morning., Disp: 30 tablet, Rfl: 2  ? ?Cardiac Studies:  ? ?Lower extremity venous insufficiency study 06/20/2011:  ?No evidence of venous insufficiency.  No evidence of DVT. ? ?Cardiac catheterization 03/11/2013: Ejection fraction 15%, global hypokinesis, mild luminal irregularity. Normal right heart pressure.. EF 07/14/13: 20-25% Improved on Medical therapy. ? ?CT angiogram abdomen 2019 right renal artery patent, moderate disease left renal artery.  ? ?PCV ECHOCARDIOGRAM COMPLETE 07/05/2021  ?Left ventricle cavity is normal in size. Moderate concentric hypertrophy of the left ventricle. Normal global wall motion. Normal LV systolic function with EF 55%. Doppler evidence of grade I (impaired) diastolic dysfunction, normal LAP. ?Moderate (Grade II) mitral regurgitation. ?Mild to moderate tricuspid regurgitation. Peak RA-RV gradient 19 mmHg. IVC is not seen. ?Mild pulmonic regurgitation. ?The aortic root is upper limit normal at 3.8 cm. ?Previous study on 06/21/2020 measured aortic root at 4.2 cm, and estimated PASP at 34 mmHg. ?Consider CT imaging of aorta, if clinically indicated. ? ?EKG:  ? ?EKG 06/27/2021: Normal sinus rhythm at rate of 73 bpm, normal axis.  Nonspecific T abnormality.no significant change from 06/27/2019.    ? ?Assessment  ? ?  ICD-10-CM   ?1. Chronic diastolic (congestive) heart failure (HCC)  I50.32   ?  ?2. Primary hypertension  I10 valsartan-hydrochlorothiazide (DIOVAN-HCT) 160-25 MG tablet  ?  ?3. Bilateral leg edema  R60.0   ?  ?4. Aortic root dilatation (HCC)  I77.810 valsartan-hydrochlorothiazide (DIOVAN-HCT) 160-25 MG tablet  ?  ?5. Renovascular hypertension  I15.0   ?  ? ?Recommendations:  ? ?Travante Knee  Is a 71 y.o. African-American male with a history of nonischemic cardiomyopathy with severe LV systolic  dysfunction, cardiac catheterization  03/11/2013 had revealed no significant coronary artery disease, ejection fraction 25%, normalization of LVEF by medical therapy. ? ?He has history of hypertension, very mild

## 2021-12-28 ENCOUNTER — Encounter: Payer: Self-pay | Admitting: Cardiology

## 2021-12-28 ENCOUNTER — Ambulatory Visit: Payer: Medicare HMO | Admitting: Cardiology

## 2021-12-28 VITALS — BP 120/67 | HR 82 | Temp 97.3°F | Resp 14 | Ht 74.0 in | Wt 190.6 lb

## 2021-12-28 DIAGNOSIS — I5032 Chronic diastolic (congestive) heart failure: Secondary | ICD-10-CM | POA: Diagnosis not present

## 2021-12-28 DIAGNOSIS — I150 Renovascular hypertension: Secondary | ICD-10-CM | POA: Diagnosis not present

## 2021-12-28 DIAGNOSIS — R600 Localized edema: Secondary | ICD-10-CM | POA: Diagnosis not present

## 2021-12-28 DIAGNOSIS — I10 Essential (primary) hypertension: Secondary | ICD-10-CM

## 2021-12-28 DIAGNOSIS — I77810 Thoracic aortic ectasia: Secondary | ICD-10-CM

## 2021-12-28 MED ORDER — VALSARTAN-HYDROCHLOROTHIAZIDE 160-25 MG PO TABS: 0.5000 | ORAL_TABLET | ORAL | 2 refills | Status: DC

## 2022-01-22 DIAGNOSIS — N182 Chronic kidney disease, stage 2 (mild): Secondary | ICD-10-CM | POA: Diagnosis not present

## 2022-01-22 DIAGNOSIS — I5032 Chronic diastolic (congestive) heart failure: Secondary | ICD-10-CM | POA: Diagnosis not present

## 2022-01-22 DIAGNOSIS — I130 Hypertensive heart and chronic kidney disease with heart failure and stage 1 through stage 4 chronic kidney disease, or unspecified chronic kidney disease: Secondary | ICD-10-CM | POA: Diagnosis not present

## 2022-02-09 DIAGNOSIS — N401 Enlarged prostate with lower urinary tract symptoms: Secondary | ICD-10-CM | POA: Diagnosis not present

## 2022-02-09 DIAGNOSIS — R339 Retention of urine, unspecified: Secondary | ICD-10-CM | POA: Diagnosis not present

## 2022-02-22 DIAGNOSIS — I5032 Chronic diastolic (congestive) heart failure: Secondary | ICD-10-CM | POA: Diagnosis not present

## 2022-02-22 DIAGNOSIS — I130 Hypertensive heart and chronic kidney disease with heart failure and stage 1 through stage 4 chronic kidney disease, or unspecified chronic kidney disease: Secondary | ICD-10-CM | POA: Diagnosis not present

## 2022-03-09 DIAGNOSIS — R339 Retention of urine, unspecified: Secondary | ICD-10-CM | POA: Diagnosis not present

## 2022-03-23 DIAGNOSIS — R7989 Other specified abnormal findings of blood chemistry: Secondary | ICD-10-CM | POA: Diagnosis not present

## 2022-03-23 DIAGNOSIS — E039 Hypothyroidism, unspecified: Secondary | ICD-10-CM | POA: Diagnosis not present

## 2022-03-23 DIAGNOSIS — R739 Hyperglycemia, unspecified: Secondary | ICD-10-CM | POA: Diagnosis not present

## 2022-03-23 DIAGNOSIS — E785 Hyperlipidemia, unspecified: Secondary | ICD-10-CM | POA: Diagnosis not present

## 2022-03-23 DIAGNOSIS — Z125 Encounter for screening for malignant neoplasm of prostate: Secondary | ICD-10-CM | POA: Diagnosis not present

## 2022-03-30 DIAGNOSIS — I420 Dilated cardiomyopathy: Secondary | ICD-10-CM | POA: Diagnosis not present

## 2022-03-30 DIAGNOSIS — F1120 Opioid dependence, uncomplicated: Secondary | ICD-10-CM | POA: Diagnosis not present

## 2022-03-30 DIAGNOSIS — I130 Hypertensive heart and chronic kidney disease with heart failure and stage 1 through stage 4 chronic kidney disease, or unspecified chronic kidney disease: Secondary | ICD-10-CM | POA: Diagnosis not present

## 2022-03-30 DIAGNOSIS — N182 Chronic kidney disease, stage 2 (mild): Secondary | ICD-10-CM | POA: Diagnosis not present

## 2022-03-30 DIAGNOSIS — Z0000 Encounter for general adult medical examination without abnormal findings: Secondary | ICD-10-CM | POA: Diagnosis not present

## 2022-03-30 DIAGNOSIS — F325 Major depressive disorder, single episode, in full remission: Secondary | ICD-10-CM | POA: Diagnosis not present

## 2022-03-30 DIAGNOSIS — N401 Enlarged prostate with lower urinary tract symptoms: Secondary | ICD-10-CM | POA: Diagnosis not present

## 2022-03-30 DIAGNOSIS — I5032 Chronic diastolic (congestive) heart failure: Secondary | ICD-10-CM | POA: Diagnosis not present

## 2022-03-30 DIAGNOSIS — R269 Unspecified abnormalities of gait and mobility: Secondary | ICD-10-CM | POA: Diagnosis not present

## 2022-03-30 DIAGNOSIS — N319 Neuromuscular dysfunction of bladder, unspecified: Secondary | ICD-10-CM | POA: Diagnosis not present

## 2022-03-30 DIAGNOSIS — G894 Chronic pain syndrome: Secondary | ICD-10-CM | POA: Diagnosis not present

## 2022-03-31 DIAGNOSIS — R82998 Other abnormal findings in urine: Secondary | ICD-10-CM | POA: Diagnosis not present

## 2022-04-05 DIAGNOSIS — N401 Enlarged prostate with lower urinary tract symptoms: Secondary | ICD-10-CM | POA: Diagnosis not present

## 2022-04-05 DIAGNOSIS — N318 Other neuromuscular dysfunction of bladder: Secondary | ICD-10-CM | POA: Diagnosis not present

## 2022-04-05 DIAGNOSIS — R339 Retention of urine, unspecified: Secondary | ICD-10-CM | POA: Diagnosis not present

## 2022-04-05 DIAGNOSIS — R972 Elevated prostate specific antigen [PSA]: Secondary | ICD-10-CM | POA: Diagnosis not present

## 2022-04-18 DIAGNOSIS — R339 Retention of urine, unspecified: Secondary | ICD-10-CM | POA: Diagnosis not present

## 2022-04-24 DIAGNOSIS — K5903 Drug induced constipation: Secondary | ICD-10-CM | POA: Diagnosis not present

## 2022-04-24 DIAGNOSIS — I130 Hypertensive heart and chronic kidney disease with heart failure and stage 1 through stage 4 chronic kidney disease, or unspecified chronic kidney disease: Secondary | ICD-10-CM | POA: Diagnosis not present

## 2022-05-23 DIAGNOSIS — R339 Retention of urine, unspecified: Secondary | ICD-10-CM | POA: Diagnosis not present

## 2022-06-02 ENCOUNTER — Other Ambulatory Visit: Payer: Self-pay | Admitting: Internal Medicine

## 2022-06-02 DIAGNOSIS — R911 Solitary pulmonary nodule: Secondary | ICD-10-CM

## 2022-06-22 DIAGNOSIS — R339 Retention of urine, unspecified: Secondary | ICD-10-CM | POA: Diagnosis not present

## 2022-06-28 ENCOUNTER — Ambulatory Visit: Payer: Medicare HMO | Admitting: Cardiology

## 2022-07-03 ENCOUNTER — Ambulatory Visit
Admission: RE | Admit: 2022-07-03 | Discharge: 2022-07-03 | Disposition: A | Discharge status: Home / Self Care | Payer: Medicare HMO | Source: Ambulatory Visit | Attending: Internal Medicine | Admitting: Internal Medicine

## 2022-07-03 DIAGNOSIS — I700 Atherosclerosis of aorta: Secondary | ICD-10-CM | POA: Diagnosis not present

## 2022-07-03 DIAGNOSIS — R911 Solitary pulmonary nodule: Secondary | ICD-10-CM | POA: Diagnosis not present

## 2022-07-03 DIAGNOSIS — R918 Other nonspecific abnormal finding of lung field: Secondary | ICD-10-CM | POA: Diagnosis not present

## 2022-07-06 ENCOUNTER — Ambulatory Visit: Payer: Medicare HMO | Admitting: Cardiology

## 2022-07-06 ENCOUNTER — Encounter: Payer: Self-pay | Admitting: Cardiology

## 2022-07-06 VITALS — BP 106/67 | HR 67 | Temp 98.4°F | Resp 16 | Ht 74.0 in | Wt 185.2 lb

## 2022-07-06 DIAGNOSIS — R339 Retention of urine, unspecified: Secondary | ICD-10-CM | POA: Diagnosis not present

## 2022-07-06 DIAGNOSIS — N130 Hydronephrosis with ureteropelvic junction obstruction: Secondary | ICD-10-CM | POA: Diagnosis not present

## 2022-07-06 DIAGNOSIS — I77810 Thoracic aortic ectasia: Secondary | ICD-10-CM

## 2022-07-06 DIAGNOSIS — I10 Essential (primary) hypertension: Secondary | ICD-10-CM | POA: Diagnosis not present

## 2022-07-06 DIAGNOSIS — R8271 Bacteriuria: Secondary | ICD-10-CM | POA: Diagnosis not present

## 2022-07-06 DIAGNOSIS — I5032 Chronic diastolic (congestive) heart failure: Secondary | ICD-10-CM | POA: Diagnosis not present

## 2022-07-06 DIAGNOSIS — N401 Enlarged prostate with lower urinary tract symptoms: Secondary | ICD-10-CM | POA: Diagnosis not present

## 2022-07-06 NOTE — Progress Notes (Addendum)
Primary Physician/Referring:  Shon Baton, MD  Patient ID: Peter [name], male    DOB: 1951-03-11, 71 y.o.   MRN: [id number]  Chief Complaint  Patient presents with   Congestive Heart Failure   Hypertension   Follow-up   HPI:    HPI: Peter [name]  is a 71 y.o. male  AAfrican-American male with a history of nonischemic cardiomyopathy with severe LV systolic dysfunction, cardiac catheterization  03/11/2013 had revealed no significant coronary artery disease, ejection fraction 25%, normalization of LVEF by medical therapy.  He has history of hypertension, very mild hyperlipidemia, mild aortic root dilatation, OSA on CPAP, statins were discontinued as he had had markedly elevated CK enzymes and rhabdomyolysis, radioactive iodine therapy for hyperthyroidismin 2014 and suspect hyperthyroidism to be the etiology for his rhabdomyolysis.   Patient has been having frequent episodes of increased leg edema and also uncontrolled hypertension, ARB therapy led to acute renal failure.   I performed MR angiogram of the abdomen without contrast on 11/15/2021 which revealed severe dilatation and distention of the urinary bladder extending superior to the umbilicus associated with obstructive uropathy and hydronephrosis.  He was urgently referred for urology evaluation, since then has been on Foley catheter with complete resolution of edema, resolution of renal failure, blood pressure is now well controlled.  He now presents for a 37-monthoffice visit and follow-up, essentially asymptomatic except for dizziness and fatigue when he takes tamsulosin.  Social History   Tobacco Use   Smoking status: Former    Packs/day: 0.50    Years: 0.00    Total pack years: 0.00    Types: Cigarettes    Quit date: 08/14/1995    Years since quitting: 26.9   Smokeless tobacco: Never  Substance Use Topics   Alcohol use: No    Alcohol/week: 0.0 standard drinks of alcohol  Marital Status: Married    ROS   Review  of Systems  Cardiovascular:  Negative for chest pain, dyspnea on exertion and leg swelling.    Objective  Blood pressure 106/67, pulse 67, temperature 98.4 F (36.9 C), temperature source Temporal, resp. rate 16, height _0  (1.88 m), weight 185 lb 3.2 oz (84 kg), SpO2 93 %. Body mass index is 23.78 kg/m.     07/06/2022    3:42 PM 12/28/2021    3:33 PM 11/16/2021    8:00 PM  Vitals with BMI  Height _1  _2    Weight 185 lbs 3 oz 190 lbs 10 oz   BMI 271.21297.58  Systolic [id number] Diastolic 67 67 91  Pulse 67 82 75      Physical Exam Constitutional:      Appearance: He is well-developed.  Neck:     Thyroid: No thyromegaly.     Vascular: No JVD.  Cardiovascular:     Rate and Rhythm: Normal rate and regular rhythm.     Pulses: Intact distal pulses.     Heart sounds: Normal heart sounds. No murmur heard.    No gallop.  Pulmonary:     Effort: Pulmonary effort is normal.     Breath sounds: Normal breath sounds.  Abdominal:     General: Bowel sounds are normal.     Palpations: Abdomen is soft.  Musculoskeletal:     Right lower leg: No edema.     Left lower leg: No edema.     Radiology   CT scan of the chest 07/03/2022: 1. Bronchial wall thickening in the lower lobes  bilaterally with debris in the right middle lobe bronchus and a few ground-glass opacities in the lingular segment of the left upper lobe, may be infectious or inflammatory. 2. Scattered pulmonary nodules bilaterally measuring up to 7 mm in the left lower lobe. 3. Cardiovascular: The heart is normal in size and there is no pericardial effusion. There is minimal atherosclerotic calcification of the aorta with mild aneurysmal dilatation of the ascending aorta measuring 4.0 cm. Pulmonary trunk is normal in caliber.   Laboratory examination:   External labs:  Labs 03/23/2022:  Serum glucose 1 7 mg, BUN 15, creatinine 1.2, EGFR 72.  mL.  Hb 10.6/HCT 31.8, platelets 184.  Normal indicis.   TSH  normal at 2.76.  A1c 5.3%.  Total cholesterol 139, triglycerides 110, HDL 36, LDL 81.  Non-HDL cholesterol 103.  Medications   Current Outpatient Medications:    acetaminophen (TYLENOL) 500 MG tablet, Take 1,000 mg by mouth every 6 (six) hours as needed for moderate pain., Disp: , Rfl:    albuterol (PROVENTIL) (5 MG/ML) 0.5% nebulizer solution, Take 2.5 mg by nebulization every 4 (four) hours as needed for wheezing or shortness of breath., Disp: , Rfl:    cetirizine (ZYRTEC) 10 MG tablet, Take 10 mg by mouth daily as needed for allergies or rhinitis., Disp: , Rfl:    docusate sodium (COLACE) 100 MG capsule, Take 400 mg by mouth daily as needed for mild constipation., Disp: , Rfl:    furosemide (LASIX) 20 MG tablet, Take 20 mg by mouth daily as needed for fluid., Disp: , Rfl:    hydrALAZINE (APRESOLINE) 50 MG tablet, Take 50 mg by mouth 3 (three) times daily., Disp: , Rfl:    ipratropium-albuterol (DUONEB) 0.5-2.5 (3) MG/3ML SOLN, Take 3 mLs by nebulization every 4 (four) hours. (Patient taking differently: Take 3 mLs by nebulization every 4 (four) hours as needed (Shortness of breath).), Disp: 360 mL, Rfl: 0   levothyroxine (SYNTHROID) 150 MCG tablet, Take 150 mcg by mouth daily before breakfast., Disp: , Rfl:    metoprolol succinate (TOPROL-XL) 100 MG 24 hr tablet, Take 100 mg by mouth daily. , Disp: , Rfl:    montelukast (SINGULAIR) 10 MG tablet, Take 10 mg by mouth at bedtime., Disp: , Rfl:    oxyCODONE (ROXICODONE) 5 MG immediate release tablet, Take 1 tablet (5 mg total) by mouth every 4 (four) hours as needed for severe pain. (Patient taking differently: Take 10 mg by mouth 2 (two) times daily.), Disp: 45 tablet, Rfl: 0   finasteride (PROSCAR) 5 MG tablet, Take 5 mg by mouth daily., Disp: , Rfl:      Radiology   CT chest without contrast 07/03/2022: 1. Bronchial wall thickening in the lower lobes bilaterally with debris in the right middle lobe bronchus and a few ground-glass opacities  in the lingular segment of the left upper lobe, may be infectious or inflammatory. 2. Scattered pulmonary nodules bilaterally measuring up to 7 mm in the left lower lobe. 3. Cardiovascular: The heart is normal in size and there is no pericardial effusion. There is minimal atherosclerotic calcification of the aorta with mild aneurysmal dilatation of the ascending aorta measuring 4.0 cm. Pulmonary trunk is normal in caliber.  Cardiac Studies:   Lower extremity venous insufficiency study 06/20/2011:  No evidence of venous insufficiency.  No evidence of DVT.  Cardiac catheterization 03/11/2013: Ejection fraction 15%, global hypokinesis, mild luminal irregularity. Normal right heart pressure.. EF 07/14/13: 20-25% Improved on Medical therapy.  CT angiogram abdomen 2019  right renal artery patent, moderate disease left renal artery.   PCV ECHOCARDIOGRAM COMPLETE 07/05/2021  Narrative Echocardiogram 07/05/2021: Left ventricle cavity is normal in size. Moderate concentric hypertrophy of the left ventricle. Normal global wall motion. Normal LV systolic function with EF 55%. Doppler evidence of grade I (impaired) diastolic dysfunction, normal LAP. Moderate (Grade II) mitral regurgitation. Mild to moderate tricuspid regurgitation. Peak RA-RV gradient 19 mmHg. IVC is not seen. Mild pulmonic regurgitation. The aortic root is upper limit normal at 3.8 cm. Previous study on 06/21/2020 measured aortic root at 4.2 cm, and estimated PASP at 34 mmHg. Consider CT imaging of aorta, if clinically indicated.   EKG:   EKG 07/06/2022: Normal sinus rhythm with rate of 79 bpm, normal axis, no evidence of ischemia, normal EKG.   EKG 06/27/2021: Normal sinus rhythm at rate of 73 bpm, normal axis.  Nonspecific T abnormality.no significant change from 06/27/2019.     Assessment     ICD-10-CM   1. Chronic diastolic (congestive) heart failure (HCC)  I50.32 EKG 12-Lead    2. Primary hypertension  I10     3. Aortic  root dilatation [facility])  I77.810      Recommendations:   Baby Stairs  Is a 22 y.o. African-American male with a history of nonischemic cardiomyopathy with severe LV systolic dysfunction, cardiac catheterization  03/11/2013 had revealed no significant coronary artery disease, ejection fraction 25%, normalization of LVEF by medical therapy.  He has history of hypertension, very mild hyperlipidemia, mild aortic root dilatation, OSA on CPAP, statins were discontinued as he had had markedly elevated CK enzymes and rhabdomyolysis, radioactive iodine therapy for hyperthyroidismin 2014 and suspect hyperthyroidism to be the etiology for his rhabdomyolysis.   He had developed acute renal failure due to obstructive uropathy in February 2023, all of this has resolved.  He is not on an ACE inhibitor or an ARB due to renal insufficiency.  Presently tolerating beta-blocker well.  Blood pressure is well controlled on present medical regimen.  Reviewed his CT scan of the chest, aortic root dilatation has remained stable at around 4 cm.  As CT was done, no need for repeating the echocardiogram this year.  However I would like to repeat echocardiogram.  The following year prior to his next office visit to follow-up on aortic root dilatation.  No clinical evidence of heart failure, he has been strict with his diet and salt restriction.  He is accompanied by his wife.  No changes in the medications were done today.  I will see him back in a year or sooner if problems.      Peter Prows, MD, [facility] 07/06/2022, 3:59 PM Office: [phone]

## 2022-08-03 DIAGNOSIS — R339 Retention of urine, unspecified: Secondary | ICD-10-CM | POA: Diagnosis not present

## 2022-08-08 DIAGNOSIS — D72819 Decreased white blood cell count, unspecified: Secondary | ICD-10-CM | POA: Diagnosis not present

## 2022-08-08 DIAGNOSIS — E039 Hypothyroidism, unspecified: Secondary | ICD-10-CM | POA: Diagnosis not present

## 2022-08-08 DIAGNOSIS — R739 Hyperglycemia, unspecified: Secondary | ICD-10-CM | POA: Diagnosis not present

## 2022-08-08 DIAGNOSIS — N401 Enlarged prostate with lower urinary tract symptoms: Secondary | ICD-10-CM | POA: Diagnosis not present

## 2022-08-08 DIAGNOSIS — R972 Elevated prostate specific antigen [PSA]: Secondary | ICD-10-CM | POA: Diagnosis not present

## 2022-08-08 DIAGNOSIS — E785 Hyperlipidemia, unspecified: Secondary | ICD-10-CM | POA: Diagnosis not present

## 2022-08-08 DIAGNOSIS — F1120 Opioid dependence, uncomplicated: Secondary | ICD-10-CM | POA: Diagnosis not present

## 2022-08-08 DIAGNOSIS — N182 Chronic kidney disease, stage 2 (mild): Secondary | ICD-10-CM | POA: Diagnosis not present

## 2022-08-08 DIAGNOSIS — I5032 Chronic diastolic (congestive) heart failure: Secondary | ICD-10-CM | POA: Diagnosis not present

## 2022-08-08 DIAGNOSIS — G894 Chronic pain syndrome: Secondary | ICD-10-CM | POA: Diagnosis not present

## 2022-08-08 DIAGNOSIS — I5022 Chronic systolic (congestive) heart failure: Secondary | ICD-10-CM | POA: Diagnosis not present

## 2022-08-08 DIAGNOSIS — N319 Neuromuscular dysfunction of bladder, unspecified: Secondary | ICD-10-CM | POA: Diagnosis not present

## 2022-08-08 DIAGNOSIS — I130 Hypertensive heart and chronic kidney disease with heart failure and stage 1 through stage 4 chronic kidney disease, or unspecified chronic kidney disease: Secondary | ICD-10-CM | POA: Diagnosis not present

## 2022-08-14 ENCOUNTER — Ambulatory Visit: Payer: Medicare HMO | Admitting: Podiatry

## 2022-08-14 ENCOUNTER — Encounter: Payer: Self-pay | Admitting: Podiatry

## 2022-08-14 DIAGNOSIS — M79675 Pain in left toe(s): Secondary | ICD-10-CM | POA: Diagnosis not present

## 2022-08-14 DIAGNOSIS — B351 Tinea unguium: Secondary | ICD-10-CM | POA: Diagnosis not present

## 2022-08-14 DIAGNOSIS — M79674 Pain in right toe(s): Secondary | ICD-10-CM

## 2022-08-15 DIAGNOSIS — N401 Enlarged prostate with lower urinary tract symptoms: Secondary | ICD-10-CM | POA: Diagnosis not present

## 2022-08-15 NOTE — Progress Notes (Signed)
Subjective:   Patient ID: Peter [name], male   DOB: 71 y.o.   MRN: [id number]   HPI Patient presents with thick yellow brittle nailbeds 1-5 both feet painful when pressed   ROS      Objective:  Physical Exam  Yellow thickened deformed nailbeds 1-5 both feet painful     Assessment:  Chronic mycotic nail infection with pain 1-5 both feet     Plan:  Debridement painful nailbeds 1-5 both feet neurogenic bleeding reappoint routine care

## 2022-08-18 ENCOUNTER — Ambulatory Visit: Payer: Medicare HMO | Admitting: Cardiology

## 2022-08-23 DIAGNOSIS — R339 Retention of urine, unspecified: Secondary | ICD-10-CM | POA: Diagnosis not present

## 2022-08-23 DIAGNOSIS — N401 Enlarged prostate with lower urinary tract symptoms: Secondary | ICD-10-CM | POA: Diagnosis not present

## 2022-08-23 DIAGNOSIS — N130 Hydronephrosis with ureteropelvic junction obstruction: Secondary | ICD-10-CM | POA: Diagnosis not present

## 2022-08-31 ENCOUNTER — Other Ambulatory Visit: Payer: Self-pay | Admitting: Urology

## 2022-09-05 NOTE — Progress Notes (Addendum)
COVID Vaccine received:  _0  No _1  Yes Date of any COVID positive Test in last 53 days:none  PCP - Shon Baton, MD Cardiologist - Adrian Prows, MD  Chest x-ray 08-01-2021 CT Chest w/o contrast 07-04-2022 EKG - 07-06-2022  Stress Test -  ECHO - 06-25-2021 Cardiac Cath - 03-11-2013  Dr Einar Gip (NICM, severe LV systolic dysfunction)   PCR screen: _2  Ordered & Completed                Hx of MRSA                      _3   No Order but Needs PROFEND                      _4   N/A for this surgery  Surgery Plan:  _5  Ambulatory                            _6  Outpatient in bed                            _7  Admit  Anesthesia:    _8  General  _9  Spinal                           _10   Choice _11   MAC   Pacemaker / ICD device _12  No _13  Yes        Device order form faxed _14  No    _15   Yes      Faxed to:  Spinal Cord Stimulator:_16  No _17  Yes      (Remind patient to bring remote DOS) Other Implants:   History of Sleep Apnea? _18  No _19  Yes   CPAP used?- _20  No _21  Yes  doesn't use CPAP anymore  Does the patient monitor blood sugar? _22  No _23  Yes  _24  N/A  Blood Thinner / Instructions: none Aspirin Instructions: none  ERAS Protocol Ordered: _25  No  _26  Yes PRE-SURGERY _27  ENSURE  _28  G2   Patient is to be NPO after: Midnight prior  Comments: PCR screen ordered d/t hx of MRSA  Activity level: Patient can not climb a flight of stairs without difficulty. Patient can  perform ADLs without assistance.   Anesthesia review: Renovascular HTN (RAS- right renal stent 2019), CHF (cath - NICM, systolic dysfunction), CKD3, asthma, problems urinating after anesthesia.  ACDF x 2 (2014- C4-7, 2016 C3-4)  Patient denies shortness of breath, fever, cough and chest pain at PAT appointment.  Patient verbalized understanding and agreement to the Pre-Surgical Instructions that were given to them at this PAT appointment. Patient was also educated of the need to review these PAT instructions again prior to his  surgery.I reviewed the appropriate phone numbers to call if they have any and questions or concerns.

## 2022-09-05 NOTE — Patient Instructions (Signed)
SURGICAL WAITING ROOM VISITATION Patients having surgery or a procedure may have no more than 2 support people in the waiting area - these visitors may rotate in the visitor waiting room.   Children under the age of 55 must have an adult with them who is not the patient. If the patient needs to stay at the hospital during part of their recovery, the visitor guidelines for inpatient rooms apply.  PRE-OP VISITATION  Pre-op nurse will coordinate an appropriate time for 1 support person to accompany the patient in pre-op.  This support person may not rotate.  This visitor will be contacted when the time is appropriate for the visitor to come back in the pre-op area.  Please refer to the [facility] website for the visitor guidelines for Inpatients (after your surgery is over and you are in a regular room).  You are not required to quarantine at this time prior to your surgery. However, you must do this: Hand Hygiene often Do NOT share personal items Notify your provider if you are in close contact with someone who has COVID or you develop fever 100.4 or greater, new onset of sneezing, cough, sore throat, shortness of breath or body aches.  If you test positive for Covid or have been in contact with anyone that has tested positive in the last 10 days please notify you surgeon.    Your procedure is scheduled on:  Friday  September 22, 2022  Report to [facility] Main Entrance: [place] entrance where the [employer] is available.   Report to admitting at:07:00 AM  +++++Call this number if you have any questions or problems the morning of surgery [phone]  DO NOT EAT OR DRINK ANYTHING AFTER MIDNIGHT THE NIGHT PRIOR TO YOUR SURGERY / PROCEDURE.   FOLLOW ANY ADDITIONAL PRE OP INSTRUCTIONS YOU RECEIVED FROM YOUR SURGEON'S OFFICE!!!   Oral Hygiene is also important to reduce your risk of infection.        Remember - BRUSH YOUR TEETH THE MORNING OF SURGERY WITH YOUR REGULAR  TOOTHPASTE  Take ONLY these medicines the morning of surgery with A SIP OF WATER: Levothyroxine (Synthroid), finasteride (Proscar), metoprolol. If needed, you may take Tylenol or Oxycodone for pain. You may use albuterol inhaler and Duoneb if needed.   If You have been diagnosed with Sleep Apnea - Bring CPAP mask and tubing day of surgery. We will provide you with a CPAP machine on the day of your surgery.                   You may not have any metal on your body including  jewelry, and body piercing  Do not wear  lotions, powders, cologne, or deodorant  Men may shave face and neck.  Patients discharged on the day of surgery will not be allowed to drive home.  Someone NEEDS to stay with you for the first 24 hours after anesthesia.  Do not bring your home medications to the hospital. The Pharmacy will dispense medications listed on your medication list to you during your admission in the Hospital.  Special Instructions: Bring a copy of your healthcare power of attorney and living will documents the day of surgery, if you wish to have them scanned into your [facility] Records- EPIC  Please read over the following fact sheets you were given: IF YOU HAVE QUESTIONS ABOUT YOUR PRE-OP INSTRUCTIONS, [place] [phone]  Zigmund Daniel)   [place] - Preparing for Surgery Before surgery, you can  play an important role.  Because skin is not sterile, your skin needs to be as free of germs as possible.  You can reduce the number of germs on your skin by washing with CHG (chlorahexidine gluconate) soap before surgery.  CHG is an antiseptic cleaner which kills germs and bonds with the skin to continue killing germs even after washing. Please DO NOT use if you have an allergy to CHG or antibacterial soaps.  If your skin becomes reddened/irritated stop using the CHG and inform your nurse when you arrive at Short Stay. Do not shave (including legs and underarms) for at least 48 hours prior to the first  CHG shower.  You may shave your face/neck.  Please follow these instructions carefully:  1.  Shower with CHG Soap the night before surgery and the  morning of surgery.  2.  If you choose to wash your hair, wash your hair first as usual with your normal  shampoo.  3.  After you shampoo, rinse your hair and body thoroughly to remove the shampoo.                             4.  Use CHG as you would any other liquid soap.  You can apply chg directly to the skin and wash.  Gently with a scrungie or clean washcloth.  5.  Apply the CHG Soap to your body ONLY FROM THE NECK DOWN.   Do not use on face/ open                           Wound or open sores. Avoid contact with eyes, ears mouth and genitals (private parts).                       Wash face,  Genitals (private parts) with your normal soap.             6.  Wash thoroughly, paying special attention to the area where your  surgery  will be performed.  7.  Thoroughly rinse your body with warm water from the neck down.  8.  DO NOT shower/wash with your normal soap after using and rinsing off the CHG Soap.            9.  Pat yourself dry with a clean towel.            10.  Wear clean pajamas.            11.  Place clean sheets on your bed the night of your first shower and do not  sleep with pets.  ON THE DAY OF SURGERY : Do not apply any lotions/deodorants the morning of surgery.  Please wear clean clothes to the hospital/surgery center.    FAILURE TO FOLLOW THESE INSTRUCTIONS MAY RESULT IN THE CANCELLATION OF YOUR SURGERY  PATIENT SIGNATURE_________________________________  NURSE SIGNATURE__________________________________  ________________________________________________________________________

## 2022-09-06 ENCOUNTER — Other Ambulatory Visit: Payer: Self-pay

## 2022-09-06 ENCOUNTER — Encounter (HOSPITAL_COMMUNITY)
Admission: RE | Admit: 2022-09-06 | Discharge: 2022-09-06 | Disposition: A | Discharge status: Home / Self Care | Payer: Medicare HMO | Source: Ambulatory Visit | Attending: Urology | Admitting: Urology

## 2022-09-06 ENCOUNTER — Encounter (HOSPITAL_COMMUNITY): Payer: Self-pay

## 2022-09-06 VITALS — BP 134/85 | HR 72 | Temp 99.0°F | Resp 16 | Ht 74.0 in | Wt 185.0 lb

## 2022-09-06 DIAGNOSIS — Z8614 Personal history of Methicillin resistant Staphylococcus aureus infection: Secondary | ICD-10-CM | POA: Diagnosis not present

## 2022-09-06 DIAGNOSIS — R339 Retention of urine, unspecified: Secondary | ICD-10-CM | POA: Diagnosis not present

## 2022-09-06 DIAGNOSIS — Z01812 Encounter for preprocedural laboratory examination: Secondary | ICD-10-CM | POA: Diagnosis not present

## 2022-09-06 DIAGNOSIS — I2510 Atherosclerotic heart disease of native coronary artery without angina pectoris: Secondary | ICD-10-CM

## 2022-09-06 HISTORY — DX: Viral cardiomyopathy: B33.24

## 2022-09-06 LAB — BASIC METABOLIC PANEL
Anion gap: 5 (ref 5–15)
BUN: 16 mg/dL (ref 8–23)
CO2: 26 mmol/L (ref 22–32)
Calcium: 9 mg/dL (ref 8.9–10.3)
Chloride: 108 mmol/L (ref 98–111)
Creatinine, Ser: 0.85 mg/dL (ref 0.61–1.24)
GFR, Estimated: >60 mL/min (ref >60)
Glucose, Bld: 70 mg/dL (ref 70–99)
Potassium: 4.7 mmol/L (ref 3.5–5.1)
Sodium: 139 mmol/L (ref 135–145)

## 2022-09-06 LAB — CBC
HCT: 36.7 % — ABNORMAL LOW (ref 39.0–52.0)
Hemoglobin: 11.7 g/dL — ABNORMAL LOW (ref 13.0–17.0)
MCH: 28.5 pg (ref 26.0–34.0)
MCHC: 31.9 g/dL (ref 30.0–36.0)
MCV: 89.5 fL (ref 80.0–100.0)
Platelets: 199 10*3/uL (ref 150–400)
RBC: 4.1 MIL/uL — ABNORMAL LOW (ref 4.22–5.81)
RDW: 14 % (ref 11.5–15.5)
WBC: 2.4 10*3/uL — ABNORMAL LOW (ref 4.0–10.5)
nRBC: 0 % (ref 0.0–0.2)

## 2022-09-07 LAB — SURGICAL PCR SCREEN
MRSA, PCR: NEGATIVE
Staphylococcus aureus: POSITIVE — AB

## 2022-09-07 NOTE — Progress Notes (Signed)
Patient's PCR screen is positive for STAPH. Appropriate notes have been placed on the patient's chart. This note has been routed to Dr. Junious Silk for review. The Patient's surgery is currently scheduled for:  09-22-22 at [facility].  Leota Jacobsen, BSN, CVRN-BC   Pre-Surgical Testing Nurse [place]  [phone]

## 2022-09-11 ENCOUNTER — Encounter (HOSPITAL_COMMUNITY): Payer: Self-pay | Admitting: Physician Assistant

## 2022-09-11 NOTE — Progress Notes (Signed)
Anesthesia Chart Review   Case: [id number] Date/Time: 09/22/22 0915   Procedure: TRANSURETHRAL WATERJET ABLATION OF PROSTATE - 77 MINS   Anesthesia type: General   Pre-op diagnosis: [place] PROSTAE HYPERPLASIA   URINARY RETENTION   Location: [facility] ROOM 05 / [facility]   Surgeons: Festus Aloe, MD       DISCUSSION:71 y.o. former smoker with h/o HTN, nonischemic cardiomyopathy, cardiac catheterization  03/11/2013 had revealed no significant coronary artery disease, CKD, BPH with urinary retention scheduled for above procedure 09/22/2022 with Dr. Festus Aloe.   Pt last seen by cardiology 07/06/2022. Stable at this visit with one year follow up recommended.   Anticipate pt can proceed with planned procedure barring acute status change.   VS: There were no vitals taken for this visit.  PROVIDERS: Shon Baton, MD is PCP   Adrian Prows, MD is Cardiologist  LABS: Labs reviewed: Acceptable for surgery. (all labs ordered are listed, but only abnormal results are displayed)  Labs Reviewed - No data to display   IMAGES:   EKG:   CV: Echocardiogram 07/05/2021:  Left ventricle cavity is normal in size. Moderate concentric hypertrophy  of the left ventricle. Normal global wall motion. Normal LV systolic  function with EF 55%. Doppler evidence of grade I (impaired) diastolic  dysfunction, normal LAP.  Moderate (Grade II) mitral regurgitation.  Mild to moderate tricuspid regurgitation. Peak RA-RV gradient 19 mmHg. IVC  is not seen.  Mild pulmonic regurgitation.  The aortic root is upper limit normal at 3.8 cm.  Previous study on 06/21/2020 measured aortic root at 4.2 cm, and estimated  PASP at 34 mmHg.  Consider CT imaging of aorta, if clinically indicated.   Echo 10/15/2013 Left ventricular internal dimension is borderline dilated. Moderate concentric hypertrophy. Diastolic filling with impaired relaxation pattern and normal to low pressure. Moderate global hypokinesis. Mild to  moderately decreased systolic global function. Calculated EF 42%. Doppler evidence of Grade I (impaired) diastolic dysfunction.  Left atrial cavity is mildly dilated. Patent foramen ovale or a fenestrated atrial septal defect is probably present. Moderate aneurysmal motion of the interatrial septum.  Mitral valve structurally normal. Mild mitral regurgitation. Mitral valve inflow A > E ratio.  Tricuspid valve structurally normal. Trace tricuspid regurgitation. IVC is dilated with respiratory variation. Suggests elevated right heart pressure. Compared to 07/14/2013, EF improve from 20-25% to 40-45%.  Past Medical History:  Diagnosis Date   Abnormal CK 03/11/2013   Acute blood loss anemia 04/11/2016   Acute combined systolic and diastolic heart failure ([place]) 03/11/2013   Acute renal insufficiency 08/13/2013   Allergy    Arthritis    right knee   Asthma    Back pain    Chronic kidney disease    Colon polyps 2003, 2015   2003: hyperplastic. 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 Einar Gip in 2014   Wears glasses    Wears partial dentures    top    Past Surgical History:  Procedure  Laterality Date   ANTERIOR CERVICAL DECOMP/DISCECTOMY FUSION N/A 07/30/2013   Procedure: CERVICAL FIVE,CERVICAL SIX CORPECTOMY,ANTERIOR ARTHRODESIS,PEEK INTERBODY GRAFT CERVICAL FOUR-CERVICAL SEVEN,ANTERIOR INSTRUMENTATION;  Surgeon: Winfield Cunas, MD;  Location: [place] [facility] ORS;  Service: Neurosurgery;  Laterality: N/A;   ANTERIOR CERVICAL  DECOMP/DISCECTOMY FUSION N/A 04/16/2015   Procedure: CERVICAL THREE-FOUR ANTERIOR CERVICAL DECOMPRESSION/DISCECTOMY FUSION 1 LEVEL;  Surgeon: Ashok Pall, MD;  Location: [place] [facility] ORS;  Service: Neurosurgery;  Laterality: N/A;  C34 anterior cervical decompression with fusion plating and bonegraft   BACK SURGERY  1/15   lumb fusion   CARDIAC CATHETERIZATION  2014   CARPAL TUNNEL RELEASE Right 03/23/2014   Procedure: RIGHT CARPAL TUNNEL RELEASE;  Surgeon: Tennis Must, MD;  Location: [place];  Service: Orthopedics;  Laterality: Right;   COLONOSCOPY  2003, 2015   LEFT AND RIGHT HEART CATHETERIZATION WITH CORONARY ANGIOGRAM N/A 03/11/2013   Procedure: LEFT AND RIGHT HEART CATHETERIZATION WITH CORONARY ANGIOGRAM;  Surgeon: Laverda Page, MD;  Location: [facility] CATH LAB;  Service: Cardiovascular;  Laterality: N/A;   LUMBAR WOUND DEBRIDEMENT N/A 11/07/2013   Procedure: LUMBAR WOUND DEBRIDEMENT;  Surgeon: Winfield Cunas, MD;  Location: [place] NEURO ORS;  Service: Neurosurgery;  Laterality: N/A;   TONSILLECTOMY  1958    MEDICATIONS: No current facility-administered medications for this encounter.    acetaminophen (TYLENOL) 500 MG tablet   aspirin EC 81 MG tablet   cetirizine (ZYRTEC) 10 MG tablet   docusate sodium (COLACE) 100 MG capsule   finasteride (PROSCAR) 5 MG tablet   furosemide (LASIX) 20 MG tablet   hydrALAZINE (APRESOLINE) 50 MG tablet   ipratropium-albuterol (DUONEB) 0.5-2.5 (3) MG/3ML SOLN   levothyroxine (SYNTHROID) 150 MCG tablet   metoprolol succinate (TOPROL-XL) 100 MG 24 hr tablet   montelukast (SINGULAIR) 10 MG tablet   Omega-3 Fatty Acids (OMEGA-3 PO)   oxyCODONE (ROXICODONE) 5 MG immediate release tablet   Pseudoephedrine HCl (SUDAFED 12 HOUR PO)   senna (SENOKOT) 8.6 MG tablet   albuterol (PROVENTIL) (5 MG/ML) 0.5% nebulizer solution    [facility] Ward, PA-C [facility] Pre-Surgical Testing [phone]

## 2022-09-21 NOTE — H&P (Signed)
Office Visit Report     08/23/2022   --------------------------------------------------------------------------------   Peter [name]. Satz  MRN: [id number]  DOB: 04-06-1951, 71 year old Male  SSN: [id number]   PRIMARY CARE:  [name], MD  PRIMARY CARE FAX:  [phone]  REFERRING:  Janith Lima, MD  PROVIDER:  Festus Aloe, M.D.  LOCATION:  [facility] [phone]     --------------------------------------------------------------------------------   CC/HPI: F/u -   1) BPH - retention - seen in our office in 2015 due to urinary retention thought to be caused from previous C-spine surgery. He had a suboptimal voiding trial with PVR and did not f/u. He underwent CT scan in 2017 with a 70 g prostate and distended bladder but no hydro. He was admitted 07/2021 due to CAP and Cr was 2.9. Dr. Einar Gip got an MRA abd to check for RAS and noted a distended bladder and left > right hydronephrosis. A foley was placed for 2L.   He was seen here Mar 2023 and tamsulosin restarted. He hasn't had a void trial. He declined CIC and considered an SPT. He has carpel tunnel and trouble gripping and maneuvering small objects.   PSA 2018-2.5, 2020 3.3, 2021 2.3, 2022 2.07, 2023 1.85. He started finasteride May 2023 --> June 2023 PSa was 3.0 with [name].   In May 2023, he had stopped tamsulosin. It made him feel funny and he did not like the side effects. He should consider starting 5ari and an outlet procedure. Cystoscopy May 2023 reveals lateral lobe hypertrophy and an obstructing median lobe. I feel to him up to about 300 and he could not void. Foley replaced. He started finasteride.   He underwent urodynamics July 2023. This revealed bladder capacity of 527, slight decrease in sensation. No leak and stable. Bladder was hypotonic with only 19 cm H2O contraction which was not well sustained. He could not void.   He failed a void trial p cystoscopy Oct 2023. He started 5ari Mar  2023. Recall his May 2023 cysto showed trilobar hypertrophy. Prostate [place] today with a 73 g prostate and 5.98 cm in length. His renal US looks good today without hydronephrosis. He is well with the foley.     ALLERGIES: Aspirin TABS Keflex CAPS    MEDICATIONS: Finasteride 5 mg tablet 1 tablet PO Daily  Levothyroxine Sodium  Metoprolol Succinate  Albuterol 90 mcg aerosol Inhalation  Aspirin Ec 81 mg tablet, delayed release Oral  [name] Urinary Drainage Bag each catheter bag as needed  B-Complex 400 mcg tablet Oral  Digoxin 250 mcg (0.25 mg) tablet Oral  Fish Oil CAPS Oral  Furosemide 20 mg tablet Oral  Hydralazine Hcl  Hydrocodone-Acetaminophen 5 mg-325 mg tablet Oral  Montelukast Sodium  Multi Vitamin/Minerals TABS Oral  Synthroid 200 mcg tablet Oral  Tylenol Extra Strength 500 mg tablet Oral     GU PSH: Complex cystometrogram, w/ void pressure and urethral pressure profile studies, any technique - 03/09/2022 Complex Uroflow - 03/09/2022 Cystoscopy - 02/09/2022 Emg surf Electrd - 03/09/2022 Inject For cystogram - 03/09/2022 Intrabd voidng Press - 03/09/2022       PSH Notes: Back Surgery   NON-GU PSH: None   GU PMH: Urinary Retention, Unspec - 08/03/2022, - 07/06/2022, - 06/22/2022, - 05/23/2022, - 04/18/2022, - 04/05/2022, - 03/09/2022, - 02/09/2022, - 12/22/2021, - 12/07/2021, Incomplete bladder emptying, - 2015, Urinary retention, - 2014 BPH w/LUTS, he did not void today. check prostate [place] next time. - 07/06/2022, Disc UDS with  pt and wife. Disc nature r/b of cont foley, CIC, another VT (on 5ari), or procedure. He wants to cont foley and 5ari. Plan another VT in 90 days. Also disc SP tube. He wants to consider laser vaporiztion if he cant void. , - 04/05/2022, We discussed the nature r/b of surveillance, alpha blocker, 5ari, daily pdei, OAB meds and procedures such as TURP, LVP, LEP, RWJ, RSLP in the OR or PUL or WVT in the office. We discussed alternative alpha blockers. He will start  finasteride-discussed FDA warnings. I will set him up for urodynamics until follow-up to review to make a plan. Given his anatomy, even if his bladder function is poor I think he has a reasonable chance of voiding without a catheter after an outlet procedure, although we discussed higher risk of failure and needing to keep the catheter., - 02/09/2022, Benign prostatic hyperplasia with urinary obstruction, - 2015 Hydronephrosis, check renal US to ensure improvement in hydro. - 07/06/2022 Bladder, Oth neuromuscular dysfunction - 04/05/2022 Elevated PSA, His PSA was 1.85 and now 3 after 3 mo of 5ari. Will need to monitor and recheck PSA in 83mo- 04/05/2022 Splitting of urinary stream, Intermittent urinary stream - 2014    NON-GU PMH: Bacteriuria (Stable), discussed expectation for bacteria in urine for a catheter - 07/06/2022, Bacteriuria, asymptomatic, - 2015 Encounter for general adult medical examination without abnormal findings, Encounter for preventive health examination - 2015 Hypotension, unspecified, Hypotension - 2014 Personal history of other diseases of the circulatory system, History of hypertension - 2014 Personal history of other diseases of the musculoskeletal system and connective tissue, History of arthritis - 2014 Personal history of other diseases of the nervous system and sense organs, History of glaucoma - 2014 Personal history of other diseases of the respiratory system, History of asthma - 2014 Personal history of other endocrine, nutritional and metabolic disease, History of hypothyroidism - 2014 Personal history of other mental and behavioral disorders, History of depression - 2014 Spinal stenosis, cervical region, Cervical stenosis of spine - 2014 Spinal stenosis, lumbar region, Lumbar stenosis - 2014 Spondylosis without myelopathy or radiculopathy, cervical region, Spondylosis, cervical - 2014 Asthma Glaucoma Heart disease,  unspecified Hypercholesterolemia Hypertension Sleep Apnea    FAMILY HISTORY: Hypertension - Runs In Family   SOCIAL HISTORY: Marital Status: Married Preferred Language: English; Ethnicity: Not Hispanic Or Latino; Race: Black or African American Current Smoking Status: Patient does not smoke anymore. Has not smoked since 11/24/1998. Smoked for 20 years. Smoked 1/2 pack per day.   Tobacco Use Assessment Completed: Used Tobacco in last 30 days? Does not use smokeless tobacco. Has never drank.  Drinks 2 caffeinated drinks per day.     Notes: Former smoker, Former smoker, Occupation, Caffeine use, Death in the family, mother, Death in the family, father, Number of children, Alcohol use   REVIEW OF SYSTEMS:    GU Review Male:   Patient denies frequent urination, hard to postpone urination, burning/ pain with urination, get up at night to urinate, leakage of urine, stream starts and stops, trouble starting your stream, have to strain to urinate , erection problems, and penile pain.  Gastrointestinal (Upper):   Patient denies nausea, vomiting, and indigestion/ heartburn.  Gastrointestinal (Lower):   Patient reports constipation. Patient denies diarrhea.  Constitutional:   Patient reports fatigue. Patient denies fever, night sweats, and weight loss.  Skin:   Patient denies skin rash/ lesion and itching.  Eyes:   Patient denies blurred vision and double vision.  Ears/ Nose/ Throat:  Patient denies sore throat and sinus problems.  Hematologic/Lymphatic:   Patient denies swollen glands and easy bruising.  Cardiovascular:   Patient denies leg swelling and chest pains.  Respiratory:   Patient denies cough and shortness of breath.  Endocrine:   Patient denies excessive thirst.  Musculoskeletal:   Patient denies back pain and joint pain.  Neurological:   Patient denies headaches and dizziness.  Psychologic:   Patient denies depression and anxiety.   VITAL SIGNS: None   MULTI-SYSTEM PHYSICAL  EXAMINATION:    Constitutional: Well-nourished. No physical deformities. Normally developed. Good grooming.  Neck: Neck symmetrical, not swollen. Normal tracheal position.  Respiratory: No labored breathing, no use of accessory muscles.   Cardiovascular: Normal temperature, normal extremity pulses, no swelling, no varicosities.  Skin: No paleness, no jaundice, no cyanosis. No lesion, no ulcer, no rash.  Neurologic / Psychiatric: Oriented to time, oriented to place, oriented to person. No depression, no anxiety, no agitation.  Gastrointestinal: No mass, no tenderness, no rigidity, non obese abdomen.     Complexity of Data:   08/15/22 12/07/21  PSA  Total PSA 2.15 ng/mL 1.85 ng/mL    PROCEDURES:         Prostate Ultrasound - [id number]  Length: 5.98 cm Height: 4.93 cm Width: 4.74 cm Volume: 73.29 ml      The transrectal ultrasound probe is introduced into the rectum, and the prostate is visualized. Ultrasonography is utilized throughout the procedure. At the conclusion of the procedure, the ultrasound probe is removed. The patient tolerates the procedure without complication.  Patient confirmed No Neulasta OnPro Device.            Renal Ultrasound - [id number]  Right Kidney: Length: 9.7 cm Depth: 5.6 cm Cortical Width: 1.5 cm Width: 3.9 cm  Left Kidney: Length: 10.4 cm Depth: 5.8 cm Cortical Width: 1.7 cm Width: 5.3 cm  Left Kidney/Ureter:  Very limited view due to overlying bowel gas however no hydro visualized. Cystic area UP.  Right Kidney/Ureter:  Multiple subcentimeter cysts. No hydro.  Bladder:  Foley catheter.      Patient confirmed No Neulasta OnPro Device.     ASSESSMENT:      ICD-10 Details  1 GU:   Urinary Retention, Unspec - R33.9 Chronic, Stable - He is eligible for a variety of procedures - we discussed the nature r/b of WVTT, LVP or WJT. He will proceed with RCWJT (aQ) and would take a blood transfusion if needed.   2   BPH w/LUTS - N40.1 Chronic, Stable  3    Hydronephrosis - N13.0 Chronic, Resolved   PLAN:           Schedule Return Visit/Planned Activity: 1-2 Weeks - Nurse Visit  Return Visit/Planned Activity: Next Available Appointment - Schedule Surgery  Return Notes: foley change           Document Letter(s):  Created for Patient: Clinical Summary         Notes:   cc; Dr [name]         Next Appointment:      Next Appointment: 09/06/2022 03:00 PM    Appointment Type: Nurse Visit NO Urine    Location: [facility] [phone]    Provider: NVALL NVALL    Reason for Visit: 1-2 week foley change / UC needed per Dr. Junious Silk task      * Signed by Festus Aloe, M.D. on 08/30/22 at 9:20 AM (EST)*      The information contained  in this medical record document is considered private and confidential patient information. This information can only be used for the medical diagnosis and/or medical services that are being provided by the patient's selected caregivers. This information can only be distributed outside of the patient's care if the patient agrees and signs waivers of authorization for this information to be sent to an outside source or route.  Add: Cx + for kleb. He started doxycycline.

## 2022-09-22 ENCOUNTER — Ambulatory Visit (HOSPITAL_COMMUNITY)
Admission: RE | Admit: 2022-09-22 | Discharge: 2022-09-22 | Disposition: A | Discharge status: Home / Self Care | Payer: Medicare HMO | Source: Ambulatory Visit | Attending: Urology | Admitting: Urology

## 2022-09-22 ENCOUNTER — Encounter (HOSPITAL_COMMUNITY): Payer: Self-pay | Admitting: Urology

## 2022-09-22 ENCOUNTER — Encounter (HOSPITAL_COMMUNITY)
Admission: RE | Disposition: A | Discharge status: Home / Self Care | Payer: Self-pay | Source: Ambulatory Visit | Attending: Urology

## 2022-09-22 DIAGNOSIS — J069 Acute upper respiratory infection, unspecified: Secondary | ICD-10-CM | POA: Insufficient documentation

## 2022-09-22 DIAGNOSIS — R339 Retention of urine, unspecified: Secondary | ICD-10-CM | POA: Diagnosis not present

## 2022-09-22 DIAGNOSIS — N401 Enlarged prostate with lower urinary tract symptoms: Secondary | ICD-10-CM | POA: Diagnosis not present

## 2022-09-22 DIAGNOSIS — Z538 Procedure and treatment not carried out for other reasons: Secondary | ICD-10-CM | POA: Insufficient documentation

## 2022-09-22 DIAGNOSIS — N138 Other obstructive and reflux uropathy: Secondary | ICD-10-CM

## 2022-09-22 SURGERY — ABLATION, PROSTATE, TRANSURETHRAL, USING WATERJET
Anesthesia: General

## 2022-09-22 MED ORDER — ORAL CARE MOUTH RINSE: 15.0000 mL | Freq: Once | OROMUCOSAL | Status: AC

## 2022-09-22 MED ORDER — CHLORHEXIDINE GLUCONATE 0.12 % MT SOLN
15.0000 mL | Freq: Once | OROMUCOSAL | Status: AC
  Administered 2022-09-22: 15 mL via OROMUCOSAL

## 2022-09-22 MED ORDER — LACTATED RINGERS IV SOLN: INTRAVENOUS | Status: DC

## 2022-09-22 MED ORDER — ACETAMINOPHEN 500 MG PO TABS
1000.0000 mg | ORAL_TABLET | Freq: Once | ORAL | Status: AC
  Administered 2022-09-22: 1000 mg via ORAL
  Filled 2022-09-22: qty 2

## 2022-09-22 SURGICAL SUPPLY — 24 items
BAG URINE DRAIN 2000ML AR STRL (UROLOGICAL SUPPLIES) ×1 IMPLANT
CANISTER SUCT 3000ML PPV (MISCELLANEOUS) ×1 IMPLANT
CATH HEMA 3WAY 30CC 22FR COUDE (CATHETERS) IMPLANT
CATH HEMA 3WAY 30CC 24FR COUDE (CATHETERS) IMPLANT
COVER MAYO STAND STRL (DRAPES) ×1 IMPLANT
DRAPE FOOT SWITCH (DRAPES) ×1 IMPLANT
GEL ULTRASOUND 8.5O AQUASONIC (MISCELLANEOUS) ×1 IMPLANT
GLOVE SURG LX STRL 7.5 STRW (GLOVE) ×1 IMPLANT
GOWN STRL REUS W/ TWL XL LVL3 (GOWN DISPOSABLE) ×1 IMPLANT
GOWN STRL REUS W/TWL XL LVL3 (GOWN DISPOSABLE) ×1
HANDPIECE AQUABEAM (MISCELLANEOUS) ×1 IMPLANT
HOLDER FOLEY CATH W/STRAP (MISCELLANEOUS) IMPLANT
LOOP CUT BIPOLAR 24F LRG (ELECTROSURGICAL) IMPLANT
MANIFOLD NEPTUNE II (INSTRUMENTS) ×1 IMPLANT
MAT ABSORB  FLUID 56X50 GRAY (MISCELLANEOUS) ×1
MAT ABSORB FLUID 56X50 GRAY (MISCELLANEOUS) ×1 IMPLANT
PACK CYSTO (CUSTOM PROCEDURE TRAY) ×1 IMPLANT
PACK DRAPE AQUABEAM (MISCELLANEOUS) ×1 IMPLANT
SYR 30ML LL (SYRINGE) IMPLANT
SYR TOOMEY IRRIG 70ML (MISCELLANEOUS) ×2
SYRINGE TOOMEY IRRIG 70ML (MISCELLANEOUS) ×2 IMPLANT
TUBING CONNECTING 10 (TUBING) ×1 IMPLANT
TUBING UROLOGY SET (TUBING) ×1 IMPLANT
UNDERPAD 30X36 HEAVY ABSORB (UNDERPADS AND DIAPERS) ×1 IMPLANT

## 2022-09-22 NOTE — Interval H&P Note (Signed)
History and Physical Interval Note:  09/22/2022 8:08 AM  Peter [name]  has presented today for surgery, with the diagnosis of [place].  The various methods of treatment have been discussed with the patient and family. After consideration of risks, benefits and other options for treatment, the patient has consented to  Procedure(s) with comments: TRANSURETHRAL WATERJET ABLATION OF PROSTATE (N/A) - 90 MINS as a surgical intervention.  The patient's history has been reviewed, patient examined, no change in status, stable for surgery.  I have reviewed the patient's chart and labs. He developed cough, congestion and sinus pressure over the weekend. He started and is on a z-pak and prednisone. He is still a little wheezing and tight in the chest talking with him. I discussed with anesthesia. To keep him safe, we need to reschedule this elective case when he is well and over this respiratory issue. Questions were answered to the patient's and wife's satisfaction.     Festus Aloe

## 2022-10-02 DIAGNOSIS — R339 Retention of urine, unspecified: Secondary | ICD-10-CM | POA: Diagnosis not present

## 2022-10-04 ENCOUNTER — Other Ambulatory Visit: Payer: Self-pay | Admitting: Urology

## 2022-10-11 NOTE — Progress Notes (Addendum)
Anesthesia Review:  PCP: Shon Baton  LOV 08/08/22  Cardiologist : Einar Gip  LOV 07/06/22  Chest x-ray : 08/01/21   CT Chest- 07/04/22  EKG  07/06/22  Echo : 07/05/21  Stress test: Cardiac Cath :  Activity level:  Sleep Study/ CPAP : has cpap  Fasting Blood Sugar :      / Checks Blood Sugar -- times a day:   Blood Thinner/ Instructions /Last Dose: ASA / Instructions/ Last Dose :    81 mg aspirin    09/06/22- cbc and bmp in epic  Surgery rescheduled form 09/22/22 due to pt developed cough and wheezing and placed on zpack

## 2022-10-11 NOTE — Patient Instructions (Addendum)
SURGICAL WAITING ROOM VISITATION Patients having surgery or a procedure may have no more than 2 support people in the waiting area - these visitors may rotate.    If the patient needs to stay at the hospital during part of their recovery, the visitor guidelines for inpatient rooms apply. Pre-op nurse will coordinate an appropriate time for 1 support person to accompany patient in pre-op.  This support person may not rotate.    Please refer to the [facility] website for the visitor guidelines for Inpatients (after your surgery is over and you are in a regular room).   Due to an increase in RSV and influenza rates and associated hospitalizations, children ages 60 and under may not visit patients in [place].     Your procedure is scheduled on: 11-03-22   Report to [facility] Main Entrance    Report to admitting at 9:45 AM   Call this number if you have problems the morning of surgery [phone]   Do not eat food or drink liquids :After Midnight.          If you have questions, please contact your surgeon's office.   FOLLOW BOWEL PREP AND ANY ADDITIONAL PRE OP INSTRUCTIONS YOU RECEIVED FROM YOUR SURGEON'S OFFICE!!!  Fleet Enema - administer the morning of surgery      Oral Hygiene is also important to reduce your risk of infection.                                    Remember - BRUSH YOUR TEETH THE MORNING OF SURGERY WITH YOUR REGULAR TOOTHPASTE   Do NOT smoke after Midnight   Take these medicines the morning of surgery with A SIP OF WATER:   Zyrtec  Finasteride  Hydralazine  Levothyroxine  Metoprolol  Tylenol if needed  Okay to use inhalers  Bring CPAP mask and tubing day of surgery.                              You may not have any metal on your body including jewelry, and body piercing             Do not wear  lotions, powders, cologne, or deodorant              Men may shave face and neck.   Do not bring valuables to the hospital. [place].   Contacts, dentures or bridgework may not be worn into surgery.   Bring small overnight bag day of surgery.   DO NOT [place]. PHARMACY WILL DISPENSE MEDICATIONS LISTED ON YOUR MEDICATION LIST TO YOU DURING YOUR ADMISSION [place]!    Special Instructions: Bring a copy of your healthcare power of attorney and living will documents the day of surgery if you haven't scanned them before.              Please read over the following fact sheets you were given: IF [place] [phone]   If you received a COVID test during your pre-op visit  it is requested that you wear a mask when out in public, stay away from anyone that may not be feeling well and notify your surgeon if you develop symptoms. If you test positive for  Covid or have been in contact with anyone that has tested positive in the last 10 days please notify you surgeon.  [facility] - Preparing for Surgery Before surgery, you can play an important role.  Because skin is not sterile, your skin needs to be as free of germs as possible.  You can reduce the number of germs on your skin by washing with CHG (chlorahexidine gluconate) soap before surgery.  CHG is an antiseptic cleaner which kills germs and bonds with the skin to continue killing germs even after washing. Please DO NOT use if you have an allergy to CHG or antibacterial soaps.  If your skin becomes reddened/irritated stop using the CHG and inform your nurse when you arrive at Short Stay. Do not shave (including legs and underarms) for at least 48 hours prior to the first CHG shower.  You may shave your face/neck.  Please follow these instructions carefully:  1.  Shower with CHG Soap the night before surgery and the  morning of surgery.  2.  If you choose to wash your hair, wash your hair first as usual with your normal  shampoo.  3.  After you  shampoo, rinse your hair and body thoroughly to remove the shampoo.                             4.  Use CHG as you would any other liquid soap.  You can apply chg directly to the skin and wash.  Gently with a scrungie or clean washcloth.  5.  Apply the CHG Soap to your body ONLY FROM THE NECK DOWN.   Do   not use on face/ open                           Wound or open sores. Avoid contact with eyes, ears mouth and   genitals (private parts).                       Wash face,  Genitals (private parts) with your normal soap.             6.  Wash thoroughly, paying special attention to the area where your    surgery  will be performed.  7.  Thoroughly rinse your body with warm water from the neck down.  8.  DO NOT shower/wash with your normal soap after using and rinsing off the CHG Soap.                9.  Pat yourself dry with a clean towel.            10.  Wear clean pajamas.            11.  Place clean sheets on your bed the night of your first shower and do not  sleep with pets. Day of Surgery : Do not apply any lotions/deodorants the morning of surgery.  Please wear clean clothes to the hospital/surgery center.  FAILURE TO FOLLOW THESE INSTRUCTIONS MAY RESULT IN THE CANCELLATION OF YOUR SURGERY  PATIENT SIGNATURE_________________________________  NURSE SIGNATURE__________________________________  ________________________________________________________________________

## 2022-10-18 ENCOUNTER — Inpatient Hospital Stay (HOSPITAL_COMMUNITY)
Admission: RE | Admit: 2022-10-18 | Discharge: 2022-10-18 | Disposition: A | Discharge status: Home / Self Care | Payer: Medicare HMO | Source: Ambulatory Visit

## 2022-10-18 NOTE — Progress Notes (Signed)
Pt was a no show for his PAT visit today. Message was left on his phone

## 2022-10-19 ENCOUNTER — Encounter (HOSPITAL_COMMUNITY)
Admission: RE | Admit: 2022-10-19 | Discharge: 2022-10-19 | Disposition: A | Discharge status: Home / Self Care | Payer: Medicare HMO | Source: Ambulatory Visit | Attending: Urology | Admitting: Urology

## 2022-10-19 ENCOUNTER — Encounter (HOSPITAL_COMMUNITY): Payer: Self-pay | Admitting: Urology

## 2022-10-19 ENCOUNTER — Other Ambulatory Visit: Payer: Self-pay

## 2022-10-19 DIAGNOSIS — Z01812 Encounter for preprocedural laboratory examination: Secondary | ICD-10-CM | POA: Insufficient documentation

## 2022-10-19 DIAGNOSIS — I2510 Atherosclerotic heart disease of native coronary artery without angina pectoris: Secondary | ICD-10-CM | POA: Diagnosis not present

## 2022-10-19 DIAGNOSIS — Z01818 Encounter for other preprocedural examination: Secondary | ICD-10-CM

## 2022-10-19 LAB — CBC
HCT: 36.9 % — ABNORMAL LOW (ref 39.0–52.0)
Hemoglobin: 11.8 g/dL — ABNORMAL LOW (ref 13.0–17.0)
MCH: 28.5 pg (ref 26.0–34.0)
MCHC: 32 g/dL (ref 30.0–36.0)
MCV: 89.1 fL (ref 80.0–100.0)
Platelets: 171 10*3/uL (ref 150–400)
RBC: 4.14 MIL/uL — ABNORMAL LOW (ref 4.22–5.81)
RDW: 13.4 % (ref 11.5–15.5)
WBC: 2.7 10*3/uL — ABNORMAL LOW (ref 4.0–10.5)
nRBC: 0 % (ref 0.0–0.2)

## 2022-10-19 LAB — BASIC METABOLIC PANEL
Anion gap: 6 (ref 5–15)
BUN: 19 mg/dL (ref 8–23)
CO2: 25 mmol/L (ref 22–32)
Calcium: 9.1 mg/dL (ref 8.9–10.3)
Chloride: 106 mmol/L (ref 98–111)
Creatinine, Ser: 0.95 mg/dL (ref 0.61–1.24)
GFR, Estimated: >60 mL/min (ref >60)
Glucose, Bld: 84 mg/dL (ref 70–99)
Potassium: 4.4 mmol/L (ref 3.5–5.1)
Sodium: 137 mmol/L (ref 135–145)

## 2022-10-19 NOTE — Patient Instructions (Signed)
DUE TO COVID-19 ONLY TWO VISITORS  (aged 71 and older)  ARE ALLOWED TO COME WITH YOU AND STAY IN THE WAITING ROOM ONLY DURING PRE OP AND PROCEDURE.   **NO VISITORS ARE ALLOWED IN THE SHORT STAY AREA OR RECOVERY ROOM!!**  IF YOU WILL BE ADMITTED INTO THE HOSPITAL YOU ARE ALLOWED ONLY FOUR SUPPORT PEOPLE DURING VISITATION HOURS ONLY (7 AM -8PM)   The support person(s) must pass our screening, gel in and out, and wear a mask at all times, including in the patient's room. Patients must also wear a mask when staff or their support person are in the room. Visitors GUEST BADGE MUST BE WORN VISIBLY  One adult visitor may remain with you overnight and MUST be in the room by 8 P.M.     Your procedure is scheduled on: 11/03/22   Report to [facility] Main Entrance    Report to admitting at 9:45 AM   Call this number if you have problems the morning of surgery [phone]   Do not eat food or drink:After Midnight       If you have questions, please contact your surgeon's office.   FOLLOW BOWEL PREP AND ANY ADDITIONAL PRE OP INSTRUCTIONS YOU RECEIVED FROM YOUR SURGEON'S OFFICE!!!           Fleets enema morning of surgery   Oral Hygiene is also important to reduce your risk of infection.                                    Remember - BRUSH YOUR TEETH THE MORNING OF SURGERY WITH YOUR REGULAR TOOTHPASTE       Take these medicines the morning of surgery with A SIP OF WATER: Zyrtec                                                                                                                           Use your inhaler and bring it with you                                                                                                                           Levothyroxine  Metoprolol                                                                                                                            Hydralazine                                                                                                                           Finasteride                                                                                                                           Tylenol if needed   Bring CPAP mask and tubing day of surgery.                               You may not have any metal on your body including  jewelry, and body piercing             Do not wear  lotions, powders, perfumes/cologne, or deodorant               Men may shave face and neck.   Do not bring valuables to the hospital. [place].   Contacts, glasses, or bridgework may not be worn into surgery.   Bring small overnight bag day of surgery.   PHARMACY WILL DISPENSE MEDICATIONS LISTED ON YOUR MEDICATION LIST TO YOU DURING YOUR ADMISSION [place]!       Special Instructions: Bring a copy of your healthcare power of attorney and living will documents  the day of surgery if you haven't scanned them before.              Please read over the following fact sheets you were given: IF YOU HAVE QUESTIONS ABOUT YOUR  PRE-OP INSTRUCTIONS PLEASE CALL [phone]    [facility] - Preparing for Surgery Before surgery, you can play an important role.  Because skin is not sterile, your skin needs to be as free of germs as possible.  You can reduce the number of germs on your skin by washing with CHG (chlorahexidine gluconate) soap before surgery.  CHG is an antiseptic cleaner which kills germs and bonds with the skin to continue killing germs even after washing. Please DO NOT use if you have an allergy to CHG or antibacterial soaps.  If your skin becomes reddened/irritated stop using the CHG and inform your nurse when you arrive at Short Stay. Do not shave (including legs and underarms) for at least 48 hours prior to the first CHG shower.  You  may shave your face/neck. Please follow these instructions carefully:  1.  Shower with CHG Soap the night before surgery and the  morning of Surgery.  2.  If you choose to wash your hair, wash your hair first as usual with your  normal  shampoo.  3.  After you shampoo, rinse your hair and body thoroughly to remove the  shampoo.                            4.  Use CHG as you would any other liquid soap.  You can apply chg directly  to the skin and wash                       Gently with a scrungie or clean washcloth.  5.  Apply the CHG Soap to your body ONLY FROM THE NECK DOWN.   Do not use on face/ open                           Wound or open sores. Avoid contact with eyes, ears mouth and genitals (private parts).                       Wash face,  Genitals (private parts) with your normal soap.             6.  Wash thoroughly, paying special attention to the area where your surgery  will be performed.  7.  Thoroughly rinse your body with warm water from the neck down.  8.  DO NOT shower/wash with your normal soap after using and rinsing off  the CHG Soap.             9.  Pat yourself dry with a clean towel.            10.  Wear clean pajamas.            11.  Place clean sheets on your bed the night of your first shower and do not  sleep with pets. Day of Surgery : Do not apply any lotions/deodorants the morning of surgery.  Please wear clean clothes to the hospital/surgery center.  FAILURE TO FOLLOW THESE INSTRUCTIONS MAY RESULT IN THE CANCELLATION OF YOUR SURGERY  ________________________________________________________________________

## 2022-10-19 NOTE — Progress Notes (Addendum)
Anesthesia note:  Bowel prep reminder:  fleets  PCP - Dr. Aviva Signs Cardiologist -Dr. Christen Butter Other- Rheumatologist- Dr. Lora Havens  Chest x-ray - 07/03/22-epic EKG - 07/06/22-epic Stress Test - no ECHO - 07/05/21-epic Cardiac Cath - no CABG-no Pacemaker/ICD device last checked:NA  Sleep Study - yes CPAP - yes  Pt is pre diabetic-no CBG at PAT visit- Fasting Blood Sugar at home- Checks Blood Sugar _____  Blood Thinner:ASA 81 mg Blood Thinner Instructions: Aspirin Instructions: Last Dose:12/23  Anesthesia review: Yes   reason:cardio  Patient denies shortness of breath, fever, cough and chest pain at PAT appointmentPt reports no SIB with activities   Patient verbalized understanding of instructions that were given to them at the PAT appointment. Patient was also instructed that they will need to review over the PAT instructions again at home before surgery.yes

## 2022-10-24 DIAGNOSIS — R339 Retention of urine, unspecified: Secondary | ICD-10-CM | POA: Diagnosis not present

## 2022-10-24 DIAGNOSIS — N130 Hydronephrosis with ureteropelvic junction obstruction: Secondary | ICD-10-CM | POA: Diagnosis not present

## 2022-11-03 ENCOUNTER — Ambulatory Visit (HOSPITAL_BASED_OUTPATIENT_CLINIC_OR_DEPARTMENT_OTHER): Payer: Medicare HMO | Admitting: Physician Assistant

## 2022-11-03 ENCOUNTER — Encounter (HOSPITAL_COMMUNITY): Payer: Self-pay | Admitting: Urology

## 2022-11-03 ENCOUNTER — Encounter (HOSPITAL_COMMUNITY)
Admission: RE | Disposition: A | Discharge status: Home / Self Care | Payer: Self-pay | Source: Ambulatory Visit | Attending: Urology

## 2022-11-03 ENCOUNTER — Other Ambulatory Visit: Payer: Self-pay

## 2022-11-03 ENCOUNTER — Ambulatory Visit (HOSPITAL_COMMUNITY): Payer: Medicare HMO | Admitting: Physician Assistant

## 2022-11-03 ENCOUNTER — Inpatient Hospital Stay (HOSPITAL_COMMUNITY)
Admission: RE | Admit: 2022-11-03 | Discharge: 2022-11-06 | DRG: 713 | Disposition: A | Discharge status: Home / Self Care | Payer: Medicare HMO | Source: Ambulatory Visit | Attending: Urology | Admitting: Urology

## 2022-11-03 DIAGNOSIS — E039 Hypothyroidism, unspecified: Secondary | ICD-10-CM | POA: Diagnosis present

## 2022-11-03 DIAGNOSIS — R319 Hematuria, unspecified: Secondary | ICD-10-CM | POA: Diagnosis not present

## 2022-11-03 DIAGNOSIS — Z79899 Other long term (current) drug therapy: Secondary | ICD-10-CM | POA: Diagnosis not present

## 2022-11-03 DIAGNOSIS — I428 Other cardiomyopathies: Secondary | ICD-10-CM | POA: Diagnosis present

## 2022-11-03 DIAGNOSIS — N138 Other obstructive and reflux uropathy: Secondary | ICD-10-CM | POA: Diagnosis present

## 2022-11-03 DIAGNOSIS — Z7982 Long term (current) use of aspirin: Secondary | ICD-10-CM

## 2022-11-03 DIAGNOSIS — I509 Heart failure, unspecified: Secondary | ICD-10-CM

## 2022-11-03 DIAGNOSIS — N186 End stage renal disease: Secondary | ICD-10-CM

## 2022-11-03 DIAGNOSIS — G4730 Sleep apnea, unspecified: Secondary | ICD-10-CM | POA: Diagnosis present

## 2022-11-03 DIAGNOSIS — Z8249 Family history of ischemic heart disease and other diseases of the circulatory system: Secondary | ICD-10-CM

## 2022-11-03 DIAGNOSIS — N401 Enlarged prostate with lower urinary tract symptoms: Principal | ICD-10-CM | POA: Diagnosis present

## 2022-11-03 DIAGNOSIS — E669 Obesity, unspecified: Secondary | ICD-10-CM | POA: Diagnosis present

## 2022-11-03 DIAGNOSIS — I130 Hypertensive heart and chronic kidney disease with heart failure and stage 1 through stage 4 chronic kidney disease, or unspecified chronic kidney disease: Secondary | ICD-10-CM | POA: Diagnosis not present

## 2022-11-03 DIAGNOSIS — Z981 Arthrodesis status: Secondary | ICD-10-CM | POA: Diagnosis not present

## 2022-11-03 DIAGNOSIS — I5042 Chronic combined systolic (congestive) and diastolic (congestive) heart failure: Secondary | ICD-10-CM | POA: Diagnosis present

## 2022-11-03 DIAGNOSIS — R3912 Poor urinary stream: Secondary | ICD-10-CM | POA: Diagnosis present

## 2022-11-03 DIAGNOSIS — Z881 Allergy status to other antibiotic agents status: Secondary | ICD-10-CM | POA: Diagnosis not present

## 2022-11-03 DIAGNOSIS — Z6824 Body mass index (BMI) 24.0-24.9, adult: Secondary | ICD-10-CM

## 2022-11-03 DIAGNOSIS — Z886 Allergy status to analgesic agent status: Secondary | ICD-10-CM

## 2022-11-03 DIAGNOSIS — R338 Other retention of urine: Secondary | ICD-10-CM | POA: Diagnosis present

## 2022-11-03 DIAGNOSIS — I110 Hypertensive heart disease with heart failure: Secondary | ICD-10-CM | POA: Diagnosis present

## 2022-11-03 DIAGNOSIS — M1990 Unspecified osteoarthritis, unspecified site: Secondary | ICD-10-CM | POA: Diagnosis present

## 2022-11-03 DIAGNOSIS — N189 Chronic kidney disease, unspecified: Secondary | ICD-10-CM | POA: Diagnosis not present

## 2022-11-03 DIAGNOSIS — Z9079 Acquired absence of other genital organ(s): Secondary | ICD-10-CM | POA: Diagnosis present

## 2022-11-03 DIAGNOSIS — R350 Frequency of micturition: Secondary | ICD-10-CM | POA: Diagnosis not present

## 2022-11-03 DIAGNOSIS — Z01818 Encounter for other preprocedural examination: Secondary | ICD-10-CM

## 2022-11-03 DIAGNOSIS — E7800 Pure hypercholesterolemia, unspecified: Secondary | ICD-10-CM | POA: Diagnosis present

## 2022-11-03 DIAGNOSIS — Z79890 Hormone replacement therapy (postmenopausal): Secondary | ICD-10-CM | POA: Diagnosis not present

## 2022-11-03 DIAGNOSIS — N400 Enlarged prostate without lower urinary tract symptoms: Secondary | ICD-10-CM | POA: Diagnosis not present

## 2022-11-03 DIAGNOSIS — Z87891 Personal history of nicotine dependence: Secondary | ICD-10-CM

## 2022-11-03 DIAGNOSIS — D631 Anemia in chronic kidney disease: Secondary | ICD-10-CM | POA: Diagnosis not present

## 2022-11-03 SURGERY — ABLATION, PROSTATE, TRANSURETHRAL, USING WATERJET
Anesthesia: General

## 2022-11-03 MED ORDER — ZOLPIDEM TARTRATE 5 MG PO TABS: 5.0000 mg | ORAL_TABLET | Freq: Every evening | ORAL | Status: DC | PRN

## 2022-11-03 MED ORDER — PHENYLEPHRINE 80 MCG/ML (10ML) SYRINGE FOR IV PUSH (FOR BLOOD PRESSURE SUPPORT)
PREFILLED_SYRINGE | INTRAVENOUS | Status: DC | PRN
  Administered 2022-11-03: 80 ug via INTRAVENOUS
  Administered 2022-11-03 (×2): 160 ug via INTRAVENOUS

## 2022-11-03 MED ORDER — IPRATROPIUM-ALBUTEROL 0.5-2.5 (3) MG/3ML IN SOLN
3.0000 mL | RESPIRATORY_TRACT | Status: DC
  Administered 2022-11-03 – 2022-11-04 (×2): 3 mL via RESPIRATORY_TRACT
  Filled 2022-11-03 (×2): qty 3

## 2022-11-03 MED ORDER — DIPHENHYDRAMINE HCL 50 MG/ML IJ SOLN: 12.5000 mg | Freq: Four times a day (QID) | INTRAMUSCULAR | Status: DC | PRN

## 2022-11-03 MED ORDER — OXYCODONE HCL 5 MG/5ML PO SOLN: 5.0000 mg | Freq: Once | ORAL | Status: AC | PRN

## 2022-11-03 MED ORDER — MONTELUKAST SODIUM 10 MG PO TABS
10.0000 mg | ORAL_TABLET | Freq: Every day | ORAL | Status: DC
  Administered 2022-11-03 – 2022-11-05 (×3): 10 mg via ORAL
  Filled 2022-11-03 (×3): qty 1

## 2022-11-03 MED ORDER — IPRATROPIUM-ALBUTEROL 0.5-2.5 (3) MG/3ML IN SOLN
3.0000 mL | Freq: Once | RESPIRATORY_TRACT | Status: AC
  Administered 2022-11-03: 3 mL via RESPIRATORY_TRACT

## 2022-11-03 MED ORDER — OXYCODONE HCL 5 MG PO TABS
ORAL_TABLET | ORAL | Status: AC
  Filled 2022-11-03: qty 1

## 2022-11-03 MED ORDER — DOXYCYCLINE HYCLATE 100 MG PO TABS
100.0000 mg | ORAL_TABLET | Freq: Two times a day (BID) | ORAL | Status: DC
  Administered 2022-11-03 – 2022-11-06 (×6): 100 mg via ORAL
  Filled 2022-11-03 (×6): qty 1

## 2022-11-03 MED ORDER — HYDRALAZINE HCL 20 MG/ML IJ SOLN
10.0000 mg | Freq: Once | INTRAMUSCULAR | Status: AC
  Administered 2022-11-03: 10 mg via INTRAVENOUS

## 2022-11-03 MED ORDER — ALBUTEROL SULFATE (2.5 MG/3ML) 0.083% IN NEBU
2.5000 mg | INHALATION_SOLUTION | Freq: Once | RESPIRATORY_TRACT | Status: AC
  Administered 2022-11-03: 2.5 mg via RESPIRATORY_TRACT

## 2022-11-03 MED ORDER — AMISULPRIDE (ANTIEMETIC) 5 MG/2ML IV SOLN: 10.0000 mg | Freq: Once | INTRAVENOUS | Status: DC | PRN

## 2022-11-03 MED ORDER — FENTANYL CITRATE PF 50 MCG/ML IJ SOSY: 25.0000 ug | PREFILLED_SYRINGE | INTRAMUSCULAR | Status: DC | PRN

## 2022-11-03 MED ORDER — ORAL CARE MOUTH RINSE: 15.0000 mL | Freq: Once | OROMUCOSAL | Status: AC

## 2022-11-03 MED ORDER — METOPROLOL SUCCINATE ER 100 MG PO TB24
100.0000 mg | ORAL_TABLET | Freq: Every day | ORAL | Status: DC
  Administered 2022-11-04 – 2022-11-06 (×3): 100 mg via ORAL
  Filled 2022-11-03 (×3): qty 1

## 2022-11-03 MED ORDER — SODIUM CHLORIDE 0.9 % IR SOLN
Status: DC | PRN
  Administered 2022-11-03 (×2): 3000 mL
  Administered 2022-11-03: 18000 mL
  Administered 2022-11-03 (×2): 3000 mL

## 2022-11-03 MED ORDER — FINASTERIDE 5 MG PO TABS
5.0000 mg | ORAL_TABLET | Freq: Every day | ORAL | Status: DC
  Administered 2022-11-04 – 2022-11-06 (×3): 5 mg via ORAL
  Filled 2022-11-03 (×3): qty 1

## 2022-11-03 MED ORDER — MORPHINE SULFATE (PF) 2 MG/ML IV SOLN: 1.0000 mg | INTRAVENOUS | Status: DC | PRN

## 2022-11-03 MED ORDER — ASPIRIN 81 MG PO TBEC: 81.0000 mg | DELAYED_RELEASE_TABLET | Freq: Every day | ORAL | 12 refills | Status: DC

## 2022-11-03 MED ORDER — ALBUTEROL SULFATE (2.5 MG/3ML) 0.083% IN NEBU
INHALATION_SOLUTION | RESPIRATORY_TRACT | Status: AC
  Filled 2022-11-03: qty 3

## 2022-11-03 MED ORDER — CIPROFLOXACIN IN D5W 400 MG/200ML IV SOLN
400.0000 mg | Freq: Two times a day (BID) | INTRAVENOUS | Status: DC
  Administered 2022-11-03: 400 mg via INTRAVENOUS
  Filled 2022-11-03 (×2): qty 200

## 2022-11-03 MED ORDER — STERILE WATER FOR IRRIGATION IR SOLN
Status: DC | PRN
  Administered 2022-11-03: 500 mL

## 2022-11-03 MED ORDER — ORAL CARE MOUTH RINSE: 15.0000 mL | OROMUCOSAL | Status: DC | PRN

## 2022-11-03 MED ORDER — DOXYCYCLINE MONOHYDRATE 100 MG PO TABS: 100.0000 mg | ORAL_TABLET | Freq: Every day | ORAL | 0 refills | Status: DC

## 2022-11-03 MED ORDER — LEVOTHYROXINE SODIUM 50 MCG PO TABS
150.0000 ug | ORAL_TABLET | Freq: Every day | ORAL | Status: DC
  Administered 2022-11-04 – 2022-11-06 (×3): 150 ug via ORAL
  Filled 2022-11-03 (×3): qty 1

## 2022-11-03 MED ORDER — FENTANYL CITRATE (PF) 100 MCG/2ML IJ SOLN
INTRAMUSCULAR | Status: AC
  Filled 2022-11-03: qty 2

## 2022-11-03 MED ORDER — FENTANYL CITRATE (PF) 100 MCG/2ML IJ SOLN
INTRAMUSCULAR | Status: DC | PRN
  Administered 2022-11-03: 50 ug via INTRAVENOUS
  Administered 2022-11-03: 100 ug via INTRAVENOUS

## 2022-11-03 MED ORDER — DIPHENHYDRAMINE HCL 12.5 MG/5ML PO ELIX: 12.5000 mg | ORAL_SOLUTION | Freq: Four times a day (QID) | ORAL | Status: DC | PRN

## 2022-11-03 MED ORDER — PROPOFOL 10 MG/ML IV BOLUS
INTRAVENOUS | Status: AC
  Filled 2022-11-03: qty 20

## 2022-11-03 MED ORDER — SUGAMMADEX SODIUM 200 MG/2ML IV SOLN
INTRAVENOUS | Status: DC | PRN
  Administered 2022-11-03: 200 mg via INTRAVENOUS

## 2022-11-03 MED ORDER — ALBUTEROL SULFATE (2.5 MG/3ML) 0.083% IN NEBU
2.5000 mg | INHALATION_SOLUTION | Freq: Three times a day (TID) | RESPIRATORY_TRACT | Status: DC | PRN
  Administered 2022-11-04: 2.5 mg via RESPIRATORY_TRACT
  Filled 2022-11-03: qty 3

## 2022-11-03 MED ORDER — TRIPLE ANTIBIOTIC 3.5-400-5000 EX OINT: TOPICAL_OINTMENT | CUTANEOUS | Status: DC | PRN

## 2022-11-03 MED ORDER — LIDOCAINE 2% (20 MG/ML) 5 ML SYRINGE
INTRAMUSCULAR | Status: DC | PRN
  Administered 2022-11-03: 60 mg via INTRAVENOUS

## 2022-11-03 MED ORDER — ACETAMINOPHEN 325 MG PO TABS: 650.0000 mg | ORAL_TABLET | ORAL | Status: DC | PRN

## 2022-11-03 MED ORDER — DEXAMETHASONE SODIUM PHOSPHATE 10 MG/ML IJ SOLN
INTRAMUSCULAR | Status: DC | PRN
  Administered 2022-11-03: 10 mg via INTRAVENOUS

## 2022-11-03 MED ORDER — SODIUM CHLORIDE 0.9 % IR SOLN
3000.0000 mL | Status: DC
  Administered 2022-11-03 – 2022-11-05 (×17): 3000 mL

## 2022-11-03 MED ORDER — PROPOFOL 10 MG/ML IV BOLUS
INTRAVENOUS | Status: DC | PRN
  Administered 2022-11-03: 140 mg via INTRAVENOUS

## 2022-11-03 MED ORDER — HYDRALAZINE HCL 20 MG/ML IJ SOLN
INTRAMUSCULAR | Status: AC
  Administered 2022-11-03: 10 mg via INTRAVENOUS
  Filled 2022-11-03: qty 1

## 2022-11-03 MED ORDER — OXYCODONE HCL 5 MG PO TABS
5.0000 mg | ORAL_TABLET | ORAL | Status: DC | PRN
  Administered 2022-11-04 – 2022-11-05 (×5): 5 mg via ORAL
  Filled 2022-11-03 (×6): qty 1

## 2022-11-03 MED ORDER — FLEET ENEMA 7-19 GM/118ML RE ENEM
1.0000 | ENEMA | Freq: Once | RECTAL | Status: DC
  Filled 2022-11-03: qty 1

## 2022-11-03 MED ORDER — CHLORHEXIDINE GLUCONATE 0.12 % MT SOLN
15.0000 mL | Freq: Once | OROMUCOSAL | Status: AC
  Administered 2022-11-03: 15 mL via OROMUCOSAL

## 2022-11-03 MED ORDER — IPRATROPIUM-ALBUTEROL 0.5-2.5 (3) MG/3ML IN SOLN
RESPIRATORY_TRACT | Status: AC
  Administered 2022-11-04: 3 mL via RESPIRATORY_TRACT
  Filled 2022-11-03: qty 3

## 2022-11-03 MED ORDER — OXYCODONE HCL 5 MG PO TABS
5.0000 mg | ORAL_TABLET | Freq: Once | ORAL | Status: AC | PRN
  Administered 2022-11-03: 5 mg via ORAL

## 2022-11-03 MED ORDER — LACTATED RINGERS IV SOLN: INTRAVENOUS | Status: DC

## 2022-11-03 MED ORDER — OXYBUTYNIN CHLORIDE 5 MG PO TABS: 5.0000 mg | ORAL_TABLET | Freq: Three times a day (TID) | ORAL | Status: DC | PRN

## 2022-11-03 MED ORDER — FUROSEMIDE 20 MG PO TABS: 20.0000 mg | ORAL_TABLET | Freq: Every day | ORAL | Status: DC | PRN

## 2022-11-03 MED ORDER — ACETAMINOPHEN 500 MG PO TABS
1000.0000 mg | ORAL_TABLET | Freq: Once | ORAL | Status: AC
  Administered 2022-11-03: 1000 mg via ORAL
  Filled 2022-11-03: qty 2

## 2022-11-03 MED ORDER — DOCUSATE SODIUM 100 MG PO CAPS: 500.0000 mg | ORAL_CAPSULE | Freq: Every day | ORAL | Status: DC | PRN

## 2022-11-03 MED ORDER — HYDRALAZINE HCL 50 MG PO TABS
50.0000 mg | ORAL_TABLET | Freq: Every day | ORAL | Status: DC
  Administered 2022-11-04 – 2022-11-06 (×3): 50 mg via ORAL
  Filled 2022-11-03 (×3): qty 1

## 2022-11-03 MED ORDER — ONDANSETRON HCL 4 MG/2ML IJ SOLN
INTRAMUSCULAR | Status: DC | PRN
  Administered 2022-11-03: 4 mg via INTRAVENOUS

## 2022-11-03 MED ORDER — ROCURONIUM BROMIDE 10 MG/ML (PF) SYRINGE
PREFILLED_SYRINGE | INTRAVENOUS | Status: DC | PRN
  Administered 2022-11-03: 60 mg via INTRAVENOUS

## 2022-11-03 MED ORDER — 0.9 % SODIUM CHLORIDE (POUR BTL) OPTIME
TOPICAL | Status: DC | PRN
  Administered 2022-11-03: 1000 mL

## 2022-11-03 SURGICAL SUPPLY — 25 items
BAG URINE DRAIN 2000ML AR STRL (UROLOGICAL SUPPLIES) ×1 IMPLANT
CANISTER SUCT 3000ML PPV (MISCELLANEOUS) ×1 IMPLANT
CATH HEMA 3WAY 30CC 22FR COUDE (CATHETERS) IMPLANT
CATH HEMA 3WAY 30CC 24FR COUDE (CATHETERS) IMPLANT
COVER MAYO STAND STRL (DRAPES) ×1 IMPLANT
DRAPE FOOT SWITCH (DRAPES) ×1 IMPLANT
GEL ULTRASOUND 8.5O AQUASONIC (MISCELLANEOUS) ×1 IMPLANT
GLOVE SURG LX STRL 7.5 STRW (GLOVE) ×1 IMPLANT
GOWN STRL REUS W/ TWL XL LVL3 (GOWN DISPOSABLE) ×1 IMPLANT
GOWN STRL REUS W/TWL XL LVL3 (GOWN DISPOSABLE) ×1
HANDPIECE AQUABEAM (MISCELLANEOUS) ×1 IMPLANT
HOLDER FOLEY CATH W/STRAP (MISCELLANEOUS) IMPLANT
LOOP CUT BIPOLAR 24F LRG (ELECTROSURGICAL) IMPLANT
MANIFOLD NEPTUNE II (INSTRUMENTS) ×1 IMPLANT
MAT ABSORB  FLUID 56X50 GRAY (MISCELLANEOUS) ×1
MAT ABSORB FLUID 56X50 GRAY (MISCELLANEOUS) ×1 IMPLANT
PACK CYSTO (CUSTOM PROCEDURE TRAY) ×1 IMPLANT
PACK DRAPE AQUABEAM (MISCELLANEOUS) ×1 IMPLANT
SYR 30ML LL (SYRINGE) ×1 IMPLANT
SYR TOOMEY IRRIG 70ML (MISCELLANEOUS) ×2
SYRINGE TOOMEY IRRIG 70ML (MISCELLANEOUS) ×2 IMPLANT
TOWEL OR 17X24 6PK STRL BLUE (TOWEL DISPOSABLE) ×1 IMPLANT
TUBING CONNECTING 10 (TUBING) ×1 IMPLANT
TUBING UROLOGY SET (TUBING) ×1 IMPLANT
UNDERPAD 30X36 HEAVY ABSORB (UNDERPADS AND DIAPERS) ×1 IMPLANT

## 2022-11-03 NOTE — Anesthesia Preprocedure Evaluation (Addendum)
Anesthesia Evaluation  Patient identified by MRN, date of birth, ID band Patient awake    Reviewed: Allergy & Precautions, NPO status , Patient's Chart, lab work & pertinent test results, reviewed documented beta blocker date and time   History of Anesthesia Complications (+) history of anesthetic complications (problems urinating afterwards)  Airway Mallampati: II  TM Distance: >3 FB Neck ROM: Full   Comment: Previous grade I view with video laryngoscopy Dental  (+) Poor Dentition 4 teeth on the bottom plus two front bottom broken ones. 2 teeth on the top.:   Pulmonary neg shortness of breath, asthma (used inhaler this morning, no recent flares) , sleep apnea , neg COPD, neg recent URI, former smoker   Pulmonary exam normal breath sounds clear to auscultation       Cardiovascular hypertension, Pt. on medications and Pt. on home beta blockers (-) angina +CHF and + DOE  (-) Past MI, (-) Cardiac Stents and (-) CABG  Rhythm:Regular Rate:Normal  Aortic root dilation  CT Chest 07/03/2022: IMPRESSION: 1. Bronchial wall thickening in the lower lobes bilaterally with debris in the right middle lobe bronchus and a few ground-glass opacities in the lingular segment of the left upper lobe, may be infectious or inflammatory. 2. Scattered pulmonary nodules bilaterally measuring up to 7 mm in the left lower lobe. Non-contrast chest CT at 6-12 months is recommended. If the nodule is stable at time of repeat CT, then future CT at 18-24 months (from today's scan) is considered optional for low-risk patients, but is recommended for high-risk patients. This recommendation follows the consensus statement: Guidelines for Management of Incidental Pulmonary Nodules Detected on CT Images: From the Fleischner Society 2017; Radiology 2017; 284:228-243. 3. Minimal aortic atherosclerosis with aneurysmal dilatation of the ascending aorta measuring 4 cm.  Recommend annual imaging followup by CTA or MRA. This recommendation follows 2010 ACCF/AHA/AATS/ACR/ASA/SCA/SCAI/SIR/STS/SVM Guidelines for the Diagnosis and Management of Patients with Thoracic Aortic Disease. Circulation. 2010; [id number]:9224643. Aortic aneurysm NOS (ICD10-I71.9)  TTE 07/05/2021: Left ventricle cavity is normal in size. Moderate concentric hypertrophy  of the left ventricle. Normal global wall motion. Normal LV systolic  function with EF 55%. Doppler evidence of grade I (impaired) diastolic  dysfunction, normal LAP.  Moderate (Grade II) mitral regurgitation.  Mild to moderate tricuspid regurgitation. Peak RA-RV gradient 19 mmHg. IVC  is not seen.  Mild pulmonic regurgitation.  The aortic root is upper limit normal at 3.8 cm.  Previous study on 06/21/2020 measured aortic root at 4.2 cm, and estimated  PASP at 34 mmHg.     Neuro/Psych neg Seizures S/p cervical fusion    GI/Hepatic Neg liver ROS,neg GERD  ,,Diverticulosis    Endo/Other  neg diabetesHypothyroidism    Renal/GU CRFRenal disease     Musculoskeletal  (+) Arthritis ,    Abdominal  (+) - obese  Peds  Hematology  (+) Blood dyscrasia, anemia   Anesthesia Other Findings 72 y.o. former smoker with h/o HTN, nonischemic cardiomyopathy, cardiac catheterization  03/11/2013 had revealed no significant coronary artery disease, CKD, BPH with urinary retention    Pt last seen by cardiology 07/06/2022. Stable at this visit with one year follow up recommended.    Reproductive/Obstetrics                             Anesthesia Physical Anesthesia Plan  ASA: 3  Anesthesia Plan: General   Post-op Pain Management: Tylenol PO (pre-op)*   Induction: Intravenous  PONV Risk Score and Plan: 2 and Ondansetron, Dexamethasone and Treatment may vary due to age or medical condition  Airway Management Planned: Oral ETT and Video Laryngoscope Planned  Additional Equipment:   Intra-op  Plan:   Post-operative Plan: Extubation in OR  Informed Consent: I have reviewed the patients History and Physical, chart, labs and discussed the procedure including the risks, benefits and alternatives for the proposed anesthesia with the patient or authorized representative who has indicated his/her understanding and acceptance.     Dental advisory given  Plan Discussed with: CRNA and Anesthesiologist  Anesthesia Plan Comments: (Risks of general anesthesia discussed including, but not limited to, sore throat, hoarse voice, chipped/damaged teeth, injury to vocal cords, nausea and vomiting, allergic reactions, lung infection, heart attack, stroke, and death. All questions answered. )        Anesthesia Quick Evaluation

## 2022-11-03 NOTE — Transfer of Care (Signed)
Immediate Anesthesia Transfer of Care Note  Patient: Peter [name]  Procedure(s) Performed: TRANSURETHRAL WATERJET ABLATION OF PROSTATE  Patient Location: PACU  Anesthesia Type:General  Level of Consciousness: awake, alert , and oriented  Airway & Oxygen Therapy: Patient Spontanous Breathing and Patient connected to face mask oxygen  Post-op Assessment: Report given to RN and Post -op Vital signs reviewed and stable  Post vital signs: Reviewed and stable  Last Vitals:  Vitals Value Taken Time  BP 177/128 11/03/22 1530  Temp 36.4 C 11/03/22 1443  Pulse 75 11/03/22 1531  Resp 25 11/03/22 1531  SpO2 100 % 11/03/22 1531  Vitals shown include unvalidated device data.  Last Pain:  Vitals:   11/03/22 1515  TempSrc:   PainSc: 0-No pain         Complications: No notable events documented.

## 2022-11-03 NOTE — Anesthesia Procedure Notes (Signed)
Procedure Name: Intubation Date/Time: 11/03/2022 1:01 PM  Performed by: Gean Maidens, CRNAPre-anesthesia Checklist: Patient identified, Emergency Drugs available, Suction available, Patient being monitored and Timeout performed Patient Re-evaluated:Patient Re-evaluated prior to induction Oxygen Delivery Method: Circle system utilized Preoxygenation: Pre-oxygenation with 100% oxygen Induction Type: IV induction Ventilation: Mask ventilation without difficulty Laryngoscope Size: Mac and 4 Grade View: Grade II Tube type: Oral Tube size: 7.5 mm Number of attempts: 1 Airway Equipment and Method: Stylet Placement Confirmation: ETT inserted through vocal cords under direct vision, positive ETCO2 and breath sounds checked- equal and bilateral Secured at: 23 cm Tube secured with: Tape Dental Injury: Teeth and Oropharynx as per pre-operative assessment

## 2022-11-03 NOTE — Progress Notes (Signed)
Peter [name] is doing well, he continues on CBI to keep grade 2-3 hematuria.  Whenever we stop the CBI he gets into a grade 4 situation.  CBI is running normally but it is getting a bit late so we will keep him overnight on CBI.  Vitals:   11/03/22 1635 11/03/22 1645  BP:  (!) 146/97  Pulse: 78 80  Resp: 13 (!) 23  Temp:    SpO2: 100% 99%   He looks well and is alert and oriented Abdomen soft and nontender GU-Foley catheter in place with grade 2-3 hematuria on CBI  Assessment/plan:  Status post water jet ablation of the prostate-admit for overnight CBI.  Home in the morning with Foley catheter.

## 2022-11-03 NOTE — Anesthesia Postprocedure Evaluation (Signed)
Anesthesia Post Note  Patient: Peter [name]  Procedure(s) Performed: TRANSURETHRAL WATERJET ABLATION OF PROSTATE     Patient location during evaluation: PACU Anesthesia Type: General Level of consciousness: awake Pain management: pain level controlled Vital Signs Assessment: post-procedure vital signs reviewed and stable Respiratory status: spontaneous breathing, nonlabored ventilation and respiratory function stable Cardiovascular status: blood pressure returned to baseline and stable Postop Assessment: no apparent nausea or vomiting Anesthetic complications: no   No notable events documented.  Last Vitals:  Vitals:   11/03/22 1533 11/03/22 1545  BP: (!) 172/101 (!) 165/98  Pulse: 66 69  Resp: 16 14  Temp:    SpO2: 100% 100%    Last Pain:  Vitals:   11/03/22 1530  TempSrc:   PainSc: 0-No pain                 Nilda Simmer

## 2022-11-03 NOTE — Discharge Instructions (Addendum)
Prostate Water Jet Surgery, Care After The following information offers guidance on how to care for yourself after your procedure. Your health care provider may also give you more specific instructions. If you have problems or questions, contact your health care provider. What can I expect after the procedure? After the procedure, it is common to have these symptoms for a few days: Swelling and discomfort around your urethra. Blood in your urine. A burning feeling when you urinate after the urinary catheter is removed. You will feel this especially at the end of urination. This feeling usually passes within 3-5 days. For the first few weeks after the procedure, you may have: A sudden need to urinate (urgency). A need to urinate often. Follow these instructions at home: Medicines Take over-the-counter and prescription medicines only as told by your health care provider. These medicines may include stool softeners. If you were prescribed an antibiotic medicine, take it as told by your health care provider. Do not stop taking the antibiotic even if you start to feel better. Bathing Do not take baths, swim, or use a hot tub until your health care provider approves. Ask your health care provider if you may take showers. You may only be allowed to take sponge baths. Activity  Rest as told by your health care provider. Do not drive or operate machinery until your health care provider says that it is safe. Do not ride in a car for long periods of time, or as told by your health care provider. Do not do strenuous exercises for 1 week or as told by your health care provider. Strenuous exercises are exercises that require a lot of effort. Do not lift anything that is heavier than 10 lb (4.5 kg), or the limit that you are told, until your health care provider says that it is safe. Avoid sex for 4-6 weeks, or as told by your health care provider. Return to your normal activities as told by your health  care provider. Ask your health care provider what activities are safe for you. Preventing constipation You may need to take these actions to prevent or treat constipation: Drink enough fluid to keep your urine pale yellow. Take over-the-counter or prescription medicines. Eat foods that are high in fiber, such as beans, whole grains, and fresh fruits and vegetables. Limit foods that are high in fat and processed sugars, such as fried or sweet foods. General instructions  Do not strain when you have a bowel movement. Straining may lead to bleeding from the prostate. This may cause blood clots and trouble urinating. Do not use any products that contain nicotine or tobacco. These products include cigarettes, chewing tobacco, and vaping devices, such as e-cigarettes. If you need help quitting, ask your health care provider. If you have a urinary catheter, care for it as told by your health care provider. Keep all follow-up visits. This is important. Contact a health care provider if: You have signs of infection such as: Fever or chills. Swelling around your urethra that is getting worse. Urine that smells very bad. Struggling to urinate or pain or burning when you urinate. You have trouble having a bowel movement. You have blood in your urine for more than 2 days after the procedure. You have trouble having or keeping an erection. No semen comes out during orgasm (dry ejaculation). You have a urinary catheter still in place and you have: Spasms or pain. Problems with the catheter or your catheter is blocked. Get help right away if:  You cannot urinate after your catheter is removed. Your urine is dark red or has blood clots in it. You have blood in your stool. You have severe pain that does not get better with medicine. You develop swelling or pain in your leg. You develop chest pains or shortness of breath. These symptoms may be an emergency. Get help right away. Call 911. Do not wait to  see if the symptoms will go away. Do not drive yourself to the hospital. Summary After the procedure, you may notice urinary symptoms for a few weeks. Follow instructions from your health care provider about restrictions for activities such as lifting, exercise, and sex. Contact your health care provider if you have a fever or chills, spasms or pain while the catheter is in place, or blood in your urine for more than 2 days after the procedure. This information is not intended to replace advice given to you by your health care provider. Make sure you discuss any questions you have with your health care provider. Document Revised: 06/03/2021 Document Reviewed: 06/03/2021 Elsevier Patient Education  [employer].

## 2022-11-03 NOTE — Op Note (Signed)
Preoperative diagnosis: BPH with lower urinary tract symptoms, weak stream, frequency  Postoperative diagnosis: Same   Procedure: Robotic water jet ablation of the prostate   Surgeon: Junious Silk   Anesthesia: General   Indication for procedure:   Findings:  On exam under anesthesia the penis was circumcised without mass or lesion.  Glans and meatus appeared normal.  Scrotum appeared normal.  On DRE prostate was about 60 g and smooth without hard area or nodule.  Cystoscopy revealed lateral lobe hypertrophy with intravesical extension more so on the left than the right.  Trigone and ureteral orifice ease were normal without injury after ablation and resection.  Description of procedure:  He was brought to the operating room and placed supine on the operating table.  After adequate anesthesia he was placed lithotomy position. Timeout was performed to confirm the patient and procedure. The TRUS Stepper was mounted to the Articulating Arm and secured to OR bed. The ultrasound probe was attached to the stepper. Exam under anesthesia was performed and the TRUS was inserted per rectum.  There was no resistance. The ultrasound probe was aligned, and confirmation made that the prostate is centered and aligned using both transverse and sagittal views. The bladder neck, verumontanum and the central/transition zones were identified.  Genitalia were prepped and draped in the usual sterile fashion. The 21F AQUABEAM Handpiece is inserted into the prostatic urethra and a complete cystoscopic evaluation was performed by inspecting the prostate, bladder, and identifying the location of the verumontanum/external sphincter. The AQUABEAM Handpiece was secured to the Handpiece Articulating Arm. Confirmed alignment of AQUABEAM Handpiece and TRUS Probe to be parallel and colinear. Confirmation that AQUABEAM nozzle is centered and anterior of the bladder neck or the median lobe. The cystoscope was then retracted to visualize  the verumontanum and external sphincter and the cystoscope tip was positioned just proximal to the external sphincter. Reconfirmed alignment of the TRUS probe with the AQUABEAM Handpiece and compression applied with TRUS probe. Horizontal alignment of the Handpiece waterjet nozzle was performed. The Aquablation treatment zones were planned utilizing real-time TRUS to visualize the contour of the prostate and the depth and radial angles of resection were defined in the transverse view. In the sagittal view, the AQUABEAM nozzle is identified and position registered with software. The treatment contours were then adjusted to conform to the intended resection margins. The median lobe, bladder neck and verumontanum were marked and confirmed in the treatment contour. The Aquablation Treatment was then started following the resection contour confirmed under ultrasound guidance. TOTAL AQUABLATION RESECTION TIME: about 8 minutes  Once Aquablation resection was complete the 24 French aqua beam handpiece was carefully removed.  The continuous-flow sheath with the visual obturator was passed and then the loop and handle.  The trigone and the ureteral orifices were identified.  Resection of some of the residual median lobe and bladder neck tissue was done.  The bladder neck was identified at 6:00 and this was taken up to 12:00 with fulguration of the bladder neck and prostate for hemostasis.  Slight amount of anterior tissue was resected.  Similarly from 6:00 up to 12:00 on the left side of the bladder neck was identified by resecting some of the ablated tissue to identify the bladder neck and cauterize any bleeding.  Some anterior tissue on the left was resected.  This created excellent hemostasis.  All the chips were evacuated.  Ureteral orifices again identified and noted to be normal without injury.  The scope was backed out and  a 45 [place] hematuria catheter was placed with 30 cc in the balloon.  The balloon was seated at  the bladder neck and it was irrigated on light traction and noted to be clear to pink.  He was hooked up to CBI.  He was cleaned up and placed supine.  Catheter was placed on traction.  He was awakened and taken to the cover room in stable condition.  Complications: None  Blood loss: 50 mL  Specimens: TURP chips  Drains: 24 French three-way hematuria catheter with 30 cc in the balloon  Disposition: Patient stable to PACU

## 2022-11-03 NOTE — Interval H&P Note (Signed)
History and Physical Interval Note:  11/03/2022 12:44 PM  Peter [name]  has presented today for surgery, with the diagnosis of BENIGN PROSTATE HYPERPLASIA.  The various methods of treatment have [name] discussed with the patient and family. After consideration of risks, benefits and other options for treatment, the patient has consented to  Procedure(s): TRANSURETHRAL WATERJET ABLATION OF PROSTATE (N/A) as a surgical intervention.  The patient's history has [name] reviewed, patient examined, no change in status, stable for surgery.  I have reviewed the patient's chart and labs.  Questions were answered to the patient's satisfaction.  He is well/ No cough, colds or congestion. No fever. Urine clear. No bladder pain. Disc alternatives again - TURP, laser, steam, etc. Also PAE and HoLEP. He will proceed with AQ.    Festus Aloe

## 2022-11-03 NOTE — H&P (Signed)
Office Visit Report     10/24/2022   --------------------------------------------------------------------------------   Peter [name]  MRN: [id number]  DOB: [birth year], 72 year old Male  SSN: [id number]   PRIMARY CARE:  Peter Reel, MD  PRIMARY CARE FAX:  [phone]  REFERRING:  Peter Millin, MD  PROVIDER:  Festus [name], M.D.  TREATING:  Peter Crocker, NP  LOCATION:  [facility] [phone]     --------------------------------------------------------------------------------   CC/HPI: F/u -   1) BPH - retention - seen in our office in 2015 due to urinary retention thought to be caused from previous C-spine surgery. He had a suboptimal voiding trial with PVR and did not f/u. He underwent CT scan in 2017 with a 70 g prostate and distended bladder but no hydro. He was admitted 07/2021 due to CAP and Cr was 2.9. Peter [name] got an MRA abd to check for RAS and noted a distended bladder and left > right hydronephrosis. A foley was placed for 2L.   He was seen here Mar 2023 and tamsulosin restarted. He hasn't had a void trial. He declined CIC and considered an SPT. He has carpel tunnel and trouble gripping and maneuvering small objects.   PSA 2018-2.5, 2020 3.3, 2021 2.3, 2022 2.07, 2023 1.85. He started finasteride May 2023 --> June 2023 PSa was 3.0 with Peter [name].   In May 2023, he had stopped tamsulosin. It made him feel funny and he did not like the side effects. He should consider starting 5ari and an outlet procedure. Cystoscopy May 2023 reveals lateral lobe hypertrophy and an obstructing median lobe. I feel to him up to about 300 and he could not void. Foley replaced. He started finasteride.   He underwent urodynamics July 2023. This revealed bladder capacity of 527, slight decrease in sensation. No leak and stable. Bladder was hypotonic with only 19 cm H2O contraction which was not well sustained. He could not void.   He failed a void trial p cystoscopy Oct  2023. He started 5ari Mar 2023. Recall his May 2023 cysto showed trilobar hypertrophy. Prostate [place] today with a 73 g prostate and 5.98 cm in length. His renal US looks good today without hydronephrosis. He is well with the foley.   10/02/2022: 72 year old man who presents today for Foley catheter exchange and to follow-up on next steps regarding rescheduling his aqua ablation. He was due for aqua ablation last month but unfortunately it had to be canceled due to respiratory illness. He is feeling much better and denies any further shortness of breath or chest pain. He would like to reschedule.   0130/2024: This patient is scheduled to undergo aqua ablation with Dr. Junious [name] on 2/9. Here today for previously scheduled preoperative appointment. Catheter exchanged at time of last exam on January 8. Urine culture assessed from the catheter at that time was negative for bacterial growth. Doing well today. Continues to tolerate catheter without any noted complication including absence of any painful/leaking urgency, gross hematuria. He had no recurrence of any upper respiratory tract infections. No changes in past medical history, prescription medications taken on daily basis, no interval surgical or procedural intervention. He had no recent fevers or chills, nausea/vomiting, chest pain or shortness of breath.     ALLERGIES: Aspirin TABS Keflex CAPS    MEDICATIONS: Doxycycline Hyclate 100 mg capsule Take one capsule twice a day for 5 days and then one capsule nightly. Begin on 10/30/22  Finasteride 5 mg tablet 1 tablet PO Daily  Levothyroxine Sodium  Metoprolol Succinate  Albuterol 90 mcg aerosol Inhalation  Aspirin Ec 81 mg tablet, delayed release Oral  Peter [name] Urinary Drainage Bag each catheter bag as needed  B-Complex 400 mcg tablet Oral  Digoxin 250 mcg (0.25 mg) tablet Oral  Fish Oil CAPS Oral  Furosemide 20 mg tablet Oral  Hydralazine Hcl  Hydrocodone-Acetaminophen 5 mg-325 mg tablet Oral   Montelukast Sodium  Multi Vitamin/Minerals TABS Oral  Synthroid 200 mcg tablet Oral  Tylenol Extra Strength 500 mg tablet Oral     GU PSH: Complex cystometrogram, w/ void pressure and urethral pressure profile studies, any technique - 03/09/2022 Complex Uroflow - 03/09/2022 Cystoscopy - 02/09/2022 Emg surf Electrd - 03/09/2022 Inject For cystogram - 03/09/2022 Intrabd voidng Press - 03/09/2022       PSH Notes: Back Surgery   NON-GU PSH: No Non-GU PSH    GU PMH: BPH w/LUTS - 10/02/2022, - 08/23/2022, he did not void today. check prostate [place] next time. , - 07/06/2022, Disc UDS with pt and wife. Disc nature r/b of cont foley, CIC, another VT (on 5ari), or procedure. He wants to cont foley and 5ari. Plan another VT in 90 days. Also disc SP tube. He wants to consider laser vaporiztion if he cant void. , - 04/05/2022, We discussed the nature r/b of surveillance, alpha blocker, 5ari, daily pdei, OAB meds and procedures such as TURP, LVP, LEP, RWJ, RSLP in the OR or PUL or WVT in the office. We discussed alternative alpha blockers. He will start finasteride-discussed FDA warnings. I will set him up for urodynamics until follow-up to review to make a plan. Given his anatomy, even if his bladder function is poor I think he has a reasonable chance of voiding without a catheter after an outlet procedure, although we discussed higher risk of failure and needing to keep the catheter., - 02/09/2022, Benign prostatic hyperplasia with urinary obstruction, - 2015 Urinary Retention, Unspec - 10/02/2022, - 09/06/2022, He is eligible for a variety of procedures - we discussed the nature r/b of WVTT, LVP or WJT. He will proceed with RCWJT (aQ) and would take a blood transfusion if needed. , - 08/23/2022, - 08/03/2022, - 07/06/2022, - 06/22/2022, - 05/23/2022, - 04/18/2022, - 04/05/2022, - 03/09/2022, - 02/09/2022, - 12/22/2021, - 12/07/2021, Incomplete bladder emptying, - 2015, Urinary retention, - 2014 Hydronephrosis - 08/23/2022,  check renal US to ensure improvement in hydro., - 07/06/2022 Bladder, Oth neuromuscular dysfunction - 04/05/2022 Elevated PSA, His PSA was 1.85 and now 3 after 3 mo of 5ari. Will need to monitor and recheck PSA in 23mo- 04/05/2022 Splitting of urinary stream, Intermittent urinary stream - 2014    NON-GU PMH: Bacteriuria (Stable), discussed expectation for bacteria in urine for a catheter - 07/06/2022, Bacteriuria, asymptomatic, - 2015 Encounter for general adult medical examination without abnormal findings, Encounter for preventive health examination - 2015 Hypotension, unspecified, Hypotension - 2014 Personal history of other diseases of the circulatory system, History of hypertension - 2014 Personal history of other diseases of the musculoskeletal system and connective tissue, History of arthritis - 2014 Personal history of other diseases of the nervous system and sense organs, History of glaucoma - 2014 Personal history of other diseases of the respiratory system, History of asthma - 2014 Personal history of other endocrine, nutritional and metabolic disease, History of hypothyroidism - 2014 Personal history of other mental and behavioral disorders, History of depression - 2014 Spinal stenosis, cervical region, Cervical stenosis of spine - 2014 Spinal stenosis, lumbar region,  Lumbar stenosis - 2014 Spondylosis without myelopathy or radiculopathy, cervical region, Spondylosis, cervical - 2014 Asthma Glaucoma Heart disease, unspecified Hypercholesterolemia Hypertension Sleep Apnea    FAMILY HISTORY: Hypertension - Runs In Family   SOCIAL HISTORY: Marital Status: Married Preferred Language: English; Ethnicity: Not Hispanic Or Latino; Race: Black or African American Current Smoking Status: Patient does not smoke anymore. Has not smoked since 11/24/1998. Smoked for 20 years. Smoked 1/2 pack per day.   Tobacco Use Assessment Completed: Used Tobacco in last 30 days? Does not use smokeless  tobacco. Has never drank.  Drinks 2 caffeinated drinks per day.     Notes: Former smoker, Former smoker, Occupation, Caffeine use, Death in the family, mother, Death in the family, father, Number of children, Alcohol use   REVIEW OF SYSTEMS:    GU Review Male:   Patient denies frequent urination, hard to postpone urination, burning/ pain with urination, get up at night to urinate, leakage of urine, stream starts and stops, trouble starting your stream, have to strain to urinate , erection problems, and penile pain.  Gastrointestinal (Upper):   Patient denies nausea, vomiting, and indigestion/ heartburn.  Gastrointestinal (Lower):   Patient denies diarrhea and constipation.  Constitutional:   Patient denies fever, night sweats, weight loss, and fatigue.  Skin:   Patient denies skin rash/ lesion and itching.  Eyes:   Patient denies blurred vision and double vision.  Ears/ Nose/ Throat:   Patient denies sore throat and sinus problems.  Hematologic/Lymphatic:   Patient denies swollen glands and easy bruising.  Cardiovascular:   Patient denies leg swelling and chest pains.  Respiratory:   Patient denies cough and shortness of breath.  Endocrine:   Patient denies excessive thirst.  Musculoskeletal:   Patient denies back pain and joint pain.  Neurological:   Patient denies headaches and dizziness.  Psychologic:   Patient denies anxiety and depression.   VITAL SIGNS:      10/24/2022 01:38 PM  Weight 190 lb / 86.18 kg  Height 74 in / 187.96 cm  BP 144/84 mmHg  Heart Rate 78 /min  Temperature 97.8 F / 36.5 C  BMI 24.4 kg/m   GU PHYSICAL EXAMINATION:    Penis: Penile foley catheter present draining grossly clear-yellow urine   MULTI-SYSTEM PHYSICAL EXAMINATION:    Constitutional: Well-nourished. No physical deformities. Normally developed. Good grooming.  Neck: Neck symmetrical, not swollen. Normal tracheal position.  Respiratory: No labored breathing, no use of accessory muscles.    Cardiovascular: Normal temperature, normal extremity pulses, no swelling, no varicosities.  Skin: No paleness, no jaundice, no cyanosis. No lesion, no ulcer, no rash.  Neurologic / Psychiatric: Oriented to time, oriented to place, oriented to person. No depression, no anxiety, no agitation.  Gastrointestinal: No mass, no tenderness, no rigidity, non obese abdomen.  Musculoskeletal: Normal gait and station of head and neck.     Complexity of Data:  Source Of History:  Patient, Medical Record Summary  Records Review:   Previous Doctor Records, Previous Hospital Records, Previous Patient Records  Urine Test Review:   Urinalysis, Urine Culture  Urodynamics Review:   Review Bladder Scan, Review Urodynamics Tests  X-Ray Review: Prostate Ultrasound: Reviewed Films.  Renal Ultrasound: Reviewed Films.     08/15/22 12/07/21  PSA  Total PSA 2.15 ng/mL 1.85 ng/mL    PROCEDURES: None   ASSESSMENT:      ICD-10 Details  1 GU:   Urinary Retention, Unspec - R33.9 Chronic, Stable  2   BPH w/LUTS -  N40.1 Chronic, Stable   PLAN:            Medications Refill Meds: Doxycycline Hyclate 100 mg capsule Take one capsule twice a day for 5 days and then one capsule nightly. Begin on 10/30/22   #20  0 Refill(s)            Orders Labs Urine Culture CATH          Schedule Return Visit/Planned Activity: Keep Scheduled Appointment - Follow up MD, Schedule Surgery          Document Letter(s):  Created for Patient: Clinical Summary         Notes:   I'm going to defer catheter exchange at this time as it's been less than one month since this was last done. Urine c/s at that time was negative for bacterial growth. Dr Peter [name] did recommend sending a urine c/s from his catheter bag so that was done today. I resent in doxycycline for him to begin next Monday. All questions answered to the best my ability regarding the upcoming procedure and expected postoperative course with understanding expressed by the  patient. He will proceed with previously scheduled local ablation on 2/9.        Next Appointment:      Next Appointment: 11/03/2022 12:00 PM    Appointment Type: Surgery     Location: [facility] [phone] [id number]    Provider: Festus [name], M.D.    Reason for Visit: OBS WL AQUABLATION      * Signed by Peter Crocker, NP on 10/24/22 at 1:55 PM (EST)*      The information contained in this medical record document is considered private and confidential patient information. This information can only be used for the medical diagnosis and/or medical services that are being provided by the patient's selected caregivers. This information can only be distributed outside of the patient's care if the patient agrees and signs waivers of authorization for this information to be sent to an outside source or route.

## 2022-11-04 MED ORDER — CHLORHEXIDINE GLUCONATE CLOTH 2 % EX PADS
6.0000 | MEDICATED_PAD | Freq: Every day | CUTANEOUS | Status: DC
  Administered 2022-11-05 – 2022-11-06 (×3): 6 via TOPICAL

## 2022-11-04 MED ORDER — IPRATROPIUM-ALBUTEROL 0.5-2.5 (3) MG/3ML IN SOLN
3.0000 mL | Freq: Two times a day (BID) | RESPIRATORY_TRACT | Status: DC
  Filled 2022-11-04: qty 3

## 2022-11-04 MED ORDER — IPRATROPIUM-ALBUTEROL 0.5-2.5 (3) MG/3ML IN SOLN
3.0000 mL | RESPIRATORY_TRACT | Status: DC
  Administered 2022-11-04 – 2022-11-06 (×13): 3 mL via RESPIRATORY_TRACT
  Filled 2022-11-04 (×13): qty 3

## 2022-11-04 NOTE — Progress Notes (Signed)
1 Day Post-Op Subjective: Patient feeling well status post aqua ablation procedure on prostate yesterday.  Urine has remained somewhat cherry overnight and CBI still on fairly brisk rate this morning.  Objective: Vital signs in last 24 hours: Temp:  [97.5 F (36.4 C)-99 F (37.2 C)] 98.3 F (36.8 C) (02/10 0508) Pulse Rate:  [64-90] 85 (02/10 0508) Resp:  [8-26] 20 (02/10 0151) BP: (137-181)/(87-128) 137/95 (02/10 0508) SpO2:  [96 %-100 %] 99 % (02/10 0537) Weight:  [86.2 kg] 86.2 kg (02/09 1041)  Intake/Output from previous day: 02/09 0701 - 02/10 0700 In: 44000 [P.O.:700; I.V.:1900] Out: 47500 [id number]; Blood:50] Intake/Output this shift: Total I/O In: -  Out: 2000 [Urine:2000]  Physical Exam:  General: Alert and oriented Abdomen: Soft, ND Incisions: Ext: NT, No erythema  Lab Results: No results for input(s): "HGB", "HCT" in the last 72 hours. BMET No results for input(s): "NA", "K", "CL", "CO2", "GLUCOSE", "BUN", "CREATININE", "CALCIUM" in the last 72 hours.   Studies/Results: No results found.  Assessment/Plan: 1.  Status post aqua ablation BPH resection doing well but continued hematuria Plan/recommendation.  Will attempt to wean CBI this morning and hopeful discharge later this afternoon if urine remains clear enough.    LOS: 0 days   Remi Haggard 11/04/2022, 8:36 AM

## 2022-11-05 DIAGNOSIS — Z8249 Family history of ischemic heart disease and other diseases of the circulatory system: Secondary | ICD-10-CM | POA: Diagnosis not present

## 2022-11-05 DIAGNOSIS — Z886 Allergy status to analgesic agent status: Secondary | ICD-10-CM | POA: Diagnosis not present

## 2022-11-05 DIAGNOSIS — I428 Other cardiomyopathies: Secondary | ICD-10-CM | POA: Diagnosis present

## 2022-11-05 DIAGNOSIS — R3912 Poor urinary stream: Secondary | ICD-10-CM | POA: Diagnosis present

## 2022-11-05 DIAGNOSIS — Z79890 Hormone replacement therapy (postmenopausal): Secondary | ICD-10-CM | POA: Diagnosis not present

## 2022-11-05 DIAGNOSIS — Z9079 Acquired absence of other genital organ(s): Secondary | ICD-10-CM | POA: Diagnosis present

## 2022-11-05 DIAGNOSIS — E7800 Pure hypercholesterolemia, unspecified: Secondary | ICD-10-CM | POA: Diagnosis present

## 2022-11-05 DIAGNOSIS — R319 Hematuria, unspecified: Secondary | ICD-10-CM | POA: Diagnosis not present

## 2022-11-05 DIAGNOSIS — M1990 Unspecified osteoarthritis, unspecified site: Secondary | ICD-10-CM | POA: Diagnosis present

## 2022-11-05 DIAGNOSIS — I5042 Chronic combined systolic (congestive) and diastolic (congestive) heart failure: Secondary | ICD-10-CM | POA: Diagnosis present

## 2022-11-05 DIAGNOSIS — Z87891 Personal history of nicotine dependence: Secondary | ICD-10-CM | POA: Diagnosis not present

## 2022-11-05 DIAGNOSIS — I110 Hypertensive heart disease with heart failure: Secondary | ICD-10-CM | POA: Diagnosis present

## 2022-11-05 DIAGNOSIS — N138 Other obstructive and reflux uropathy: Secondary | ICD-10-CM | POA: Diagnosis present

## 2022-11-05 DIAGNOSIS — R338 Other retention of urine: Secondary | ICD-10-CM | POA: Diagnosis present

## 2022-11-05 DIAGNOSIS — Z981 Arthrodesis status: Secondary | ICD-10-CM | POA: Diagnosis not present

## 2022-11-05 DIAGNOSIS — N401 Enlarged prostate with lower urinary tract symptoms: Secondary | ICD-10-CM | POA: Diagnosis present

## 2022-11-05 DIAGNOSIS — Z7982 Long term (current) use of aspirin: Secondary | ICD-10-CM | POA: Diagnosis not present

## 2022-11-05 DIAGNOSIS — E669 Obesity, unspecified: Secondary | ICD-10-CM | POA: Diagnosis present

## 2022-11-05 DIAGNOSIS — G4730 Sleep apnea, unspecified: Secondary | ICD-10-CM | POA: Diagnosis present

## 2022-11-05 DIAGNOSIS — Z79899 Other long term (current) drug therapy: Secondary | ICD-10-CM | POA: Diagnosis not present

## 2022-11-05 DIAGNOSIS — E039 Hypothyroidism, unspecified: Secondary | ICD-10-CM | POA: Diagnosis present

## 2022-11-05 DIAGNOSIS — Z881 Allergy status to other antibiotic agents status: Secondary | ICD-10-CM | POA: Diagnosis not present

## 2022-11-05 DIAGNOSIS — Z6824 Body mass index (BMI) 24.0-24.9, adult: Secondary | ICD-10-CM | POA: Diagnosis not present

## 2022-11-05 NOTE — Plan of Care (Signed)
  Problem: Skin Integrity: Goal: Risk for impaired skin integrity will decrease Outcome: Progressing   Problem: Safety: Goal: Ability to remain free from injury will improve Outcome: Progressing   Problem: Pain Managment: Goal: General experience of comfort will improve Outcome: Progressing   Problem: Coping: Goal: Level of anxiety will decrease Outcome: Progressing

## 2022-11-05 NOTE — Progress Notes (Signed)
2 Days Post-Op Subjective: Feels well, CBI remains on it moderate rate this morning with cherry colored urine.  I sped up CBI rate and cleared promptly.  Will try again to wean off today.  Foley placed on mild traction.  Objective: Vital signs in last 24 hours: Temp:  [97.8 F (36.6 C)-98.9 F (37.2 C)] 98.8 F (37.1 C) (02/11 0450) Pulse Rate:  [79-89] 79 (02/11 0450) Resp:  [18-20] 18 (02/11 0450) BP: (119-152)/(83-92) 151/84 (02/11 0450) SpO2:  [94 %-99 %] 98 % (02/11 0450)  Intake/Output from previous day: 02/10 0701 - 02/11 0700 In: 12560 [P.O.:560] Out: 21725 [Urine:21725] Intake/Output this shift: Total I/O In: 3000 [Other:3000] Out: 1475 [Urine:1475]  Physical Exam:  General: Alert and oriented  Lab Results: No results for input(s): "HGB", "HCT" in the last 72 hours. BMET No results for input(s): "NA", "K", "CL", "CO2", "GLUCOSE", "BUN", "CREATININE", "CALCIUM" in the last 72 hours.   Studies/Results: No results found.  Assessment/Plan: 1.  Status post aqua ablation procedure for BPH with continued hematuria requiring CBI Plan/recommendation.  Hopeful wean CBI today and possible discharge later this afternoon.    LOS: 0 days   Peter [name] 11/05/2022, 8:28 AM

## 2022-11-05 NOTE — TOC CM/SW Note (Signed)
  Transition of Care (TOC) Screening Note   Patient Details  Name: Peter [name] Date of Birth: [birth year]   Transition of Care [facility]) CM/SW Contact:    Lennart Pall, LCSW Phone Number: 11/05/2022, 9:22 AM    Transition of Care Department [facility]) has reviewed patient and no TOC needs have been identified at this time. We will continue to monitor patient advancement through interdisciplinary progression rounds. If new patient transition needs arise, please place a TOC consult.

## 2022-11-06 LAB — SURGICAL PATHOLOGY

## 2022-11-06 NOTE — Plan of Care (Signed)
  Problem: Education: Goal: Knowledge of General Education information will improve Description Including pain rating scale, medication(s)/side effects and non-pharmacologic comfort measures Outcome: Progressing   Problem: Clinical Measurements: Goal: Will remain free from infection Outcome: Progressing   Problem: Activity: Goal: Risk for activity intolerance will decrease Outcome: Progressing   Problem: Coping: Goal: Level of anxiety will decrease Outcome: Progressing   Problem: Pain Managment: Goal: General experience of comfort will improve Outcome: Progressing   Problem: Safety: Goal: Ability to remain free from injury will improve Outcome: Progressing   

## 2022-11-06 NOTE — Discharge Summary (Signed)
Physician Discharge Summary  Patient ID: Peter [name] MRN: [id number] DOB/AGE: 1951-02-06 72 y.o.  Admit date: 11/03/2022 Discharge date: 11/06/2022  Admission Diagnoses: BPH with obstruction Discharge Diagnoses:  Same    Discharged Condition: good  Hospital Course: Patient admitted following robotic water jet ablation of prostate (Aquablation). He has done well. Tolerating a regular diet and off CBI POD#3.   Consults: None  Significant Diagnostic Studies: none  Treatments: surgery: Robotic water jet ablation of prostate (Aquablation)  Discharge Exam: Blood pressure 139/84, pulse 87, temperature 99 F (37.2 C), temperature source Oral, resp. rate 16, height 6' 2"$  (1.88 m), weight 86.2 kg, SpO2 94 %. NAD, alert and O , talking with his wife CV - RRR Resp - reg effort and depth on room air Abd - soft, NT GU - foley in place -urine a light, clear pomegranate color. No active bleeding or clots.  Ext - no CCE , no calf pain or swelling    Disposition: Discharge disposition: 01-Home or Self Care       Discharge Instructions     Continue foley catheter   Complete by: As directed    Discharge patient   Complete by: As directed    Continue traction for 2 hours.  If hematuria remains Grade 1 or 2, take off traction and continue CBI for 30 minutes. If hematuria remains Grade 1 and 2, turn off CBI. If hematuria remains Grade 1 to 2, discharge to home with foley to gravity.   Discharge disposition: 01-Home or Self Care   Discharge patient date: 11/03/2022      Allergies as of 11/06/2022       Reactions   Benicar [olmesartan] Other (See Comments)   Acute renal failure   Keflex [cephalexin] Anaphylaxis   Amlodipine Swelling   Aspirin Other (See Comments)   Exacerbates asthma attacks.    Tamsulosin Other (See Comments)   "Felt bad, made Pt irritable"         Medication List     TAKE these medications    acetaminophen 500 MG tablet Commonly known as:  TYLENOL Take 1,000 mg by mouth 2 (two) times daily as needed for moderate pain.   albuterol 108 (90 Base) MCG/ACT inhaler Commonly known as: VENTOLIN HFA Inhale 2 puffs into the lungs every 8 (eight) hours as needed for shortness of breath or wheezing.   aspirin EC 81 MG tablet Take 1 tablet (81 mg total) by mouth daily.   cetirizine 10 MG tablet Commonly known as: ZYRTEC Take 10 mg by mouth daily as needed for allergies or rhinitis.   docusate sodium 100 MG capsule Commonly known as: COLACE Take 500 mg by mouth daily as needed for mild constipation.   doxycycline 100 MG tablet Commonly known as: ADOXA Take 1 tablet (100 mg total) by mouth at bedtime. What changed: when to take this   finasteride 5 MG tablet Commonly known as: PROSCAR Take 5 mg by mouth daily.   furosemide 20 MG tablet Commonly known as: LASIX Take 20 mg by mouth daily as needed for fluid.   hydrALAZINE 50 MG tablet Commonly known as: APRESOLINE Take 50 mg by mouth daily.   ipratropium-albuterol 0.5-2.5 (3) MG/3ML Soln Commonly known as: DUONEB Take 3 mLs by nebulization every 4 (four) hours. What changed:  when to take this reasons to take this   levothyroxine 150 MCG tablet Commonly known as: SYNTHROID Take 150 mcg by mouth daily before breakfast.   metoprolol succinate 100 MG 24 hr tablet  Commonly known as: TOPROL-XL Take 100 mg by mouth daily.   montelukast 10 MG tablet Commonly known as: SINGULAIR Take 10 mg by mouth at bedtime.   OMEGA-3 PO Take 2 capsules by mouth at bedtime.   oxyCODONE 5 MG immediate release tablet Commonly known as: Roxicodone Take 1 tablet (5 mg total) by mouth every 4 (four) hours as needed for severe pain.   senna 8.6 MG tablet Commonly known as: SENOKOT Take 1 tablet by mouth daily as needed for constipation.   SUDAFED 12 HOUR PO Take 2 tablets by mouth at bedtime as needed (congestion).        Follow-up Information     Festus Aloe, MD  Follow up on 11/10/2022.   Specialty: Urology Why: at 34 AM with NP [facility] information: [place] [id number] [phone]                 Signed: Festus Aloe 11/06/2022, 12:48 PM

## 2022-11-06 NOTE — Progress Notes (Signed)
Pt well this AM - POD#3. Eating breakfast.   Vitals:   11/05/22 2313 11/06/22 0402  BP: (!) 144/87 139/84  Pulse: 100 87  Resp: 18 16  Temp: 99.4 F (37.4 C) 99 F (37.2 C)  SpO2: 97% 94%    Urine grade 2 on slow cbi gtt.   CBI clamped. Anticipate d/c later this AM.

## 2022-11-07 SURGERY — Surgical Case
Anesthesia: *Unknown

## 2022-11-10 DIAGNOSIS — R310 Gross hematuria: Secondary | ICD-10-CM | POA: Diagnosis not present

## 2022-11-24 DIAGNOSIS — R339 Retention of urine, unspecified: Secondary | ICD-10-CM | POA: Diagnosis not present

## 2022-11-24 DIAGNOSIS — N401 Enlarged prostate with lower urinary tract symptoms: Secondary | ICD-10-CM | POA: Diagnosis not present

## 2022-11-27 DIAGNOSIS — N318 Other neuromuscular dysfunction of bladder: Secondary | ICD-10-CM | POA: Diagnosis not present

## 2022-11-27 DIAGNOSIS — R339 Retention of urine, unspecified: Secondary | ICD-10-CM | POA: Diagnosis not present

## 2022-11-30 DIAGNOSIS — I5022 Chronic systolic (congestive) heart failure: Secondary | ICD-10-CM | POA: Diagnosis not present

## 2022-11-30 DIAGNOSIS — I7121 Aneurysm of the ascending aorta, without rupture: Secondary | ICD-10-CM | POA: Diagnosis not present

## 2022-11-30 DIAGNOSIS — I77810 Thoracic aortic ectasia: Secondary | ICD-10-CM | POA: Diagnosis not present

## 2022-11-30 DIAGNOSIS — N401 Enlarged prostate with lower urinary tract symptoms: Secondary | ICD-10-CM | POA: Diagnosis not present

## 2022-11-30 DIAGNOSIS — G894 Chronic pain syndrome: Secondary | ICD-10-CM | POA: Diagnosis not present

## 2022-11-30 DIAGNOSIS — N319 Neuromuscular dysfunction of bladder, unspecified: Secondary | ICD-10-CM | POA: Diagnosis not present

## 2022-11-30 DIAGNOSIS — I130 Hypertensive heart and chronic kidney disease with heart failure and stage 1 through stage 4 chronic kidney disease, or unspecified chronic kidney disease: Secondary | ICD-10-CM | POA: Diagnosis not present

## 2022-11-30 DIAGNOSIS — N182 Chronic kidney disease, stage 2 (mild): Secondary | ICD-10-CM | POA: Diagnosis not present

## 2022-11-30 DIAGNOSIS — R911 Solitary pulmonary nodule: Secondary | ICD-10-CM | POA: Diagnosis not present

## 2022-12-11 DIAGNOSIS — R339 Retention of urine, unspecified: Secondary | ICD-10-CM | POA: Diagnosis not present

## 2023-01-11 ENCOUNTER — Other Ambulatory Visit: Payer: Self-pay

## 2023-01-11 DIAGNOSIS — R339 Retention of urine, unspecified: Secondary | ICD-10-CM | POA: Diagnosis not present

## 2023-01-11 MED ORDER — HYDRALAZINE HCL 50 MG PO TABS: 50.0000 mg | ORAL_TABLET | Freq: Every day | ORAL | 3 refills | Status: AC

## 2023-01-30 DIAGNOSIS — N182 Chronic kidney disease, stage 2 (mild): Secondary | ICD-10-CM | POA: Diagnosis not present

## 2023-01-30 DIAGNOSIS — M549 Dorsalgia, unspecified: Secondary | ICD-10-CM | POA: Diagnosis not present

## 2023-01-30 DIAGNOSIS — F1120 Opioid dependence, uncomplicated: Secondary | ICD-10-CM | POA: Diagnosis not present

## 2023-01-30 DIAGNOSIS — Z978 Presence of other specified devices: Secondary | ICD-10-CM | POA: Diagnosis not present

## 2023-01-30 DIAGNOSIS — M1990 Unspecified osteoarthritis, unspecified site: Secondary | ICD-10-CM | POA: Diagnosis not present

## 2023-01-30 DIAGNOSIS — G894 Chronic pain syndrome: Secondary | ICD-10-CM | POA: Diagnosis not present

## 2023-01-30 DIAGNOSIS — N319 Neuromuscular dysfunction of bladder, unspecified: Secondary | ICD-10-CM | POA: Diagnosis not present

## 2023-01-30 DIAGNOSIS — I130 Hypertensive heart and chronic kidney disease with heart failure and stage 1 through stage 4 chronic kidney disease, or unspecified chronic kidney disease: Secondary | ICD-10-CM | POA: Diagnosis not present

## 2023-02-02 DIAGNOSIS — N401 Enlarged prostate with lower urinary tract symptoms: Secondary | ICD-10-CM | POA: Diagnosis not present

## 2023-02-02 DIAGNOSIS — R339 Retention of urine, unspecified: Secondary | ICD-10-CM | POA: Diagnosis not present

## 2023-02-08 DIAGNOSIS — R3914 Feeling of incomplete bladder emptying: Secondary | ICD-10-CM | POA: Diagnosis not present

## 2023-02-08 DIAGNOSIS — N318 Other neuromuscular dysfunction of bladder: Secondary | ICD-10-CM | POA: Diagnosis not present

## 2023-02-08 DIAGNOSIS — N401 Enlarged prostate with lower urinary tract symptoms: Secondary | ICD-10-CM | POA: Diagnosis not present

## 2023-02-08 DIAGNOSIS — R339 Retention of urine, unspecified: Secondary | ICD-10-CM | POA: Diagnosis not present

## 2023-02-16 DIAGNOSIS — N400 Enlarged prostate without lower urinary tract symptoms: Secondary | ICD-10-CM | POA: Diagnosis not present

## 2023-02-16 DIAGNOSIS — R339 Retention of urine, unspecified: Secondary | ICD-10-CM | POA: Diagnosis not present

## 2023-02-27 DIAGNOSIS — J45909 Unspecified asthma, uncomplicated: Secondary | ICD-10-CM | POA: Diagnosis not present

## 2023-02-27 DIAGNOSIS — G4733 Obstructive sleep apnea (adult) (pediatric): Secondary | ICD-10-CM | POA: Diagnosis not present

## 2023-02-27 DIAGNOSIS — J309 Allergic rhinitis, unspecified: Secondary | ICD-10-CM | POA: Diagnosis not present

## 2023-02-28 DIAGNOSIS — R3914 Feeling of incomplete bladder emptying: Secondary | ICD-10-CM | POA: Diagnosis not present

## 2023-02-28 DIAGNOSIS — N3000 Acute cystitis without hematuria: Secondary | ICD-10-CM | POA: Diagnosis not present

## 2023-02-28 DIAGNOSIS — N318 Other neuromuscular dysfunction of bladder: Secondary | ICD-10-CM | POA: Diagnosis not present

## 2023-03-27 DIAGNOSIS — M549 Dorsalgia, unspecified: Secondary | ICD-10-CM | POA: Diagnosis not present

## 2023-03-27 DIAGNOSIS — J45909 Unspecified asthma, uncomplicated: Secondary | ICD-10-CM | POA: Diagnosis not present

## 2023-03-27 DIAGNOSIS — G4733 Obstructive sleep apnea (adult) (pediatric): Secondary | ICD-10-CM | POA: Diagnosis not present

## 2023-03-28 DIAGNOSIS — R339 Retention of urine, unspecified: Secondary | ICD-10-CM | POA: Diagnosis not present

## 2023-03-28 DIAGNOSIS — N400 Enlarged prostate without lower urinary tract symptoms: Secondary | ICD-10-CM | POA: Diagnosis not present

## 2023-04-19 DIAGNOSIS — I130 Hypertensive heart and chronic kidney disease with heart failure and stage 1 through stage 4 chronic kidney disease, or unspecified chronic kidney disease: Secondary | ICD-10-CM | POA: Diagnosis not present

## 2023-04-19 DIAGNOSIS — E538 Deficiency of other specified B group vitamins: Secondary | ICD-10-CM | POA: Diagnosis not present

## 2023-04-19 DIAGNOSIS — N401 Enlarged prostate with lower urinary tract symptoms: Secondary | ICD-10-CM | POA: Diagnosis not present

## 2023-04-19 DIAGNOSIS — R739 Hyperglycemia, unspecified: Secondary | ICD-10-CM | POA: Diagnosis not present

## 2023-04-19 DIAGNOSIS — D649 Anemia, unspecified: Secondary | ICD-10-CM | POA: Diagnosis not present

## 2023-04-19 DIAGNOSIS — I5042 Chronic combined systolic (congestive) and diastolic (congestive) heart failure: Secondary | ICD-10-CM | POA: Diagnosis not present

## 2023-04-19 DIAGNOSIS — E785 Hyperlipidemia, unspecified: Secondary | ICD-10-CM | POA: Diagnosis not present

## 2023-04-19 DIAGNOSIS — E039 Hypothyroidism, unspecified: Secondary | ICD-10-CM | POA: Diagnosis not present

## 2023-04-19 DIAGNOSIS — R972 Elevated prostate specific antigen [PSA]: Secondary | ICD-10-CM | POA: Diagnosis not present

## 2023-04-19 DIAGNOSIS — N182 Chronic kidney disease, stage 2 (mild): Secondary | ICD-10-CM | POA: Diagnosis not present

## 2023-04-19 DIAGNOSIS — Z1212 Encounter for screening for malignant neoplasm of rectum: Secondary | ICD-10-CM | POA: Diagnosis not present

## 2023-04-26 DIAGNOSIS — N319 Neuromuscular dysfunction of bladder, unspecified: Secondary | ICD-10-CM | POA: Diagnosis not present

## 2023-04-26 DIAGNOSIS — G894 Chronic pain syndrome: Secondary | ICD-10-CM | POA: Diagnosis not present

## 2023-04-26 DIAGNOSIS — Z23 Encounter for immunization: Secondary | ICD-10-CM | POA: Diagnosis not present

## 2023-04-26 DIAGNOSIS — I5032 Chronic diastolic (congestive) heart failure: Secondary | ICD-10-CM | POA: Diagnosis not present

## 2023-04-26 DIAGNOSIS — N182 Chronic kidney disease, stage 2 (mild): Secondary | ICD-10-CM | POA: Diagnosis not present

## 2023-04-26 DIAGNOSIS — Z0000 Encounter for general adult medical examination without abnormal findings: Secondary | ICD-10-CM | POA: Diagnosis not present

## 2023-04-26 DIAGNOSIS — I130 Hypertensive heart and chronic kidney disease with heart failure and stage 1 through stage 4 chronic kidney disease, or unspecified chronic kidney disease: Secondary | ICD-10-CM | POA: Diagnosis not present

## 2023-04-26 DIAGNOSIS — R82998 Other abnormal findings in urine: Secondary | ICD-10-CM | POA: Diagnosis not present

## 2023-04-26 DIAGNOSIS — F325 Major depressive disorder, single episode, in full remission: Secondary | ICD-10-CM | POA: Diagnosis not present

## 2023-04-26 DIAGNOSIS — I5022 Chronic systolic (congestive) heart failure: Secondary | ICD-10-CM | POA: Diagnosis not present

## 2023-04-26 DIAGNOSIS — F1120 Opioid dependence, uncomplicated: Secondary | ICD-10-CM | POA: Diagnosis not present

## 2023-05-02 ENCOUNTER — Ambulatory Visit: Payer: Medicare HMO | Admitting: Pulmonary Disease

## 2023-05-02 ENCOUNTER — Encounter: Payer: Self-pay | Admitting: Pulmonary Disease

## 2023-05-02 VITALS — BP 118/64 | HR 78 | Temp 97.4°F | Ht 74.0 in | Wt 184.8 lb

## 2023-05-02 DIAGNOSIS — R918 Other nonspecific abnormal finding of lung field: Secondary | ICD-10-CM

## 2023-05-02 DIAGNOSIS — J4550 Severe persistent asthma, uncomplicated: Secondary | ICD-10-CM

## 2023-05-02 MED ORDER — BUDESONIDE-FORMOTEROL FUMARATE 160-4.5 MCG/ACT IN AERO: 2.0000 | INHALATION_SPRAY | Freq: Two times a day (BID) | RESPIRATORY_TRACT | 6 refills | Status: AC

## 2023-05-02 NOTE — Progress Notes (Signed)
@Patient  ID: Peter [name], male    DOB: 06-30-1951, 72 y.o.   MRN: [id number]  Chief Complaint  Patient presents with   Consult    Asthma. Increased SOB at night. ACT- 10    Referring provider: Creola Corn, MD  HPI:   72 y.o. man whom we are seeing for evaluation of asthma, shortness of breath.  Most recent cardiology note x2 reviewed.  Patient reports some shortness of breath.  Dyspnea on exertion.  Present for the last several months.  Seems worse in the evenings, at night.  No seasonal or environmental factors she can identify to make things better or worse no position that makes things better or worse.  Albuterol helps maybe just a little bit.  Recently started on low-dose Symbicort.  Feels like it has helped.  Quite a bit.  But still with residual symptoms not fully controlled.  Reviewed most recent chest imaging CT chest 06/2022 that on my review interpretation reveals scattered small nodules, no significant emphysema, bronchial wall thickening suggestive of chronic inflammation in the setting of likely asthma versus chronic bronchitis.  Questionaires / Pulmonary Flowsheets:   ACT:  Asthma Control Test ACT Total Score  05/02/2023  2:30 PM 10    MMRC:     No data to display          Epworth:      No data to display          Tests:   FENO:  No results found for: "NITRICOXIDE"  PFT:     No data to display          WALK:      No data to display          Imaging: Personally reviewed and as per EMR and discussion in this note No results found.  Lab Results: Personally reviewed CBC    Component Value Date/Time   WBC 2.7 (L) 10/19/2022 1406   RBC 4.14 (L) 10/19/2022 1406   HGB 11.8 (L) 10/19/2022 1406   HCT 36.9 (L) 10/19/2022 1406   PLT 171 10/19/2022 1406   MCV 89.1 10/19/2022 1406   MCH 28.5 10/19/2022 1406   MCHC 32.0 10/19/2022 1406   RDW 13.4 10/19/2022 1406   LYMPHSABS 1.1 08/01/2021 2050   MONOABS 0.4 08/01/2021 2050    EOSABS 0.0 08/01/2021 2050   BASOSABS 0.0 08/01/2021 2050    BMET    Component Value Date/Time   NA 137 10/19/2022 1406   NA 141 10/24/2021 1539   K 4.4 10/19/2022 1406   CL 106 10/19/2022 1406   CO2 25 10/19/2022 1406   GLUCOSE 84 10/19/2022 1406   BUN 19 10/19/2022 1406   BUN 15 10/24/2021 1539   CREATININE 0.95 10/19/2022 1406   CALCIUM 9.1 10/19/2022 1406   GFRNONAA >60 10/19/2022 1406   GFRAA >60 06/03/2020 1410    BNP    Component Value Date/Time   BNP 6.5 09/06/2021 1530   BNP 22.2 07/20/2021 1613    ProBNP    Component Value Date/Time   PROBNP 475.2 (H) 03/12/2013 0800    Specialty Problems       Pulmonary Problems   DOE (dyspnea on exertion)   Complex sleep apnea syndrome   Obstructive sleep apnea syndrome   Asthma exacerbation   Severe persistent asthma   CAP (community acquired pneumonia)    Allergies  Allergen Reactions   Benicar [Olmesartan] Other (See Comments)    Acute renal failure   Keflex [Cephalexin] Anaphylaxis  Amlodipine Swelling   Aspirin Other (See Comments)    Exacerbates asthma attacks.    Tamsulosin Other (See Comments)    "Felt bad, made Pt irritable"     Immunization History  Administered Date(s) Administered   Moderna Sars-Covid-2 Vaccination 04/29/2020    Past Medical History:  Diagnosis Date   Abnormal CK 03/11/2013   Acute blood loss anemia 04/11/2016   Acute combined systolic and diastolic heart failure (HCC) 03/11/2013   Acute renal insufficiency 08/13/2013   Allergy    Arthritis    right knee   Asthma    Back pain    Chronic kidney disease    Colon polyps 2003, 2015   2003: hyperplastic. 05/2014: tubular adenoma   Complication of anesthesia    problems urinating after anesthesia   Diverticulosis of colon 2003, 2015   Hypertension    Hypothyroidism    had Graves' disease - thyroid ablation   MRSA (methicillin resistant Staphylococcus aureus) infection    lumbar-2015   Obstructive sleep apnea  syndrome 09/22/2014   uses cpap   Opiate use 10/02/2017   S/P radioactive iodine thyroid ablation 06/04/2017   Substance abuse (HCC)    not in many years   Viral cardiomyopathy (HCC)    per Cath by Dr. [name] in 2014   Wears glasses    Wears partial dentures    top    Tobacco History: Social History   Tobacco Use  Smoking Status Former   Current packs/day: 0.00   Types: Cigarettes   Start date: 08/14/1995   Quit date: 08/14/1995   Years since quitting: 27.7  Smokeless Tobacco Never   Counseling given: Not Answered   Continue to not smoke  Outpatient Encounter Medications as of 05/02/2023  Medication Sig   acetaminophen (TYLENOL) 500 MG tablet Take 1,000 mg by mouth 2 (two) times daily as needed for moderate pain.   albuterol (VENTOLIN HFA) 108 (90 Base) MCG/ACT inhaler Inhale 2 puffs into the lungs every 8 (eight) hours as needed for shortness of breath or wheezing.   budesonide-formoterol (SYMBICORT) 160-4.5 MCG/ACT inhaler Inhale 2 puffs into the lungs in the morning and at bedtime.   cetirizine (ZYRTEC) 10 MG tablet Take 10 mg by mouth daily as needed for allergies or rhinitis.   docusate sodium (COLACE) 100 MG capsule Take 500 mg by mouth daily as needed for mild constipation.   doxycycline (ADOXA) 100 MG tablet Take 1 tablet (100 mg total) by mouth at bedtime.   finasteride (PROSCAR) 5 MG tablet Take 5 mg by mouth daily.   furosemide (LASIX) 20 MG tablet Take 20 mg by mouth daily as needed for fluid.   hydrALAZINE (APRESOLINE) 50 MG tablet Take 1 tablet (50 mg total) by mouth daily.   ipratropium-albuterol (DUONEB) 0.5-2.5 (3) MG/3ML SOLN Take 3 mLs by nebulization every 4 (four) hours. (Patient taking differently: Take 3 mLs by nebulization every 4 (four) hours as needed (Shortness of breath).)   levothyroxine (SYNTHROID) 150 MCG tablet Take 150 mcg by mouth daily before breakfast.   metoprolol succinate (TOPROL-XL) 100 MG 24 hr tablet Take 100 mg by mouth daily.     montelukast (SINGULAIR) 10 MG tablet Take 10 mg by mouth at bedtime.   Omega-3 Fatty Acids (OMEGA-3 PO) Take 2 capsules by mouth at bedtime.   oxyCODONE (ROXICODONE) 5 MG immediate release tablet Take 1 tablet (5 mg total) by mouth every 4 (four) hours as needed for severe pain.   Pseudoephedrine HCl (SUDAFED 12 HOUR PO)  Take 2 tablets by mouth at bedtime as needed (congestion).   senna (SENOKOT) 8.6 MG tablet Take 1 tablet by mouth daily as needed for constipation.   [DISCONTINUED] aspirin EC 81 MG tablet Take 1 tablet (81 mg total) by mouth daily.   No facility-administered encounter medications on file as of 05/02/2023.     Review of Systems  Review of Systems  No chest pain with exertion.  No orthopnea or PND.  Comprehensive review of systems otherwise negative. Physical Exam  BP 118/64 (BP Location: Left Arm, Cuff Size: Normal)   Pulse 78   Temp (!) 97.4 F (36.3 C) (Oral)   Ht 6\' 2"  (1.88 m)   Wt 184 lb 12.8 oz (83.8 kg)   SpO2 96%   BMI 23.73 kg/m   Wt Readings from Last 5 Encounters:  05/02/23 184 lb 12.8 oz (83.8 kg)  11/03/22 190 lb 0.6 oz (86.2 kg)  09/06/22 185 lb (83.9 kg)  07/06/22 185 lb 3.2 oz (84 kg)  12/28/21 190 lb 9.6 oz (86.5 kg)    BMI Readings from Last 5 Encounters:  05/02/23 23.73 kg/m  11/03/22 24.40 kg/m  09/06/22 23.75 kg/m  07/06/22 23.78 kg/m  12/28/21 24.47 kg/m     Physical Exam General: Sitting in chair, no acute distress Eyes: EOMI, no icterus Neck: Supple, no JVP Pulmonary: Clear, distant, normal work of breathing Cardiovascular: Warm, no edema Abdomen: Nondistended, bowel sounds present MSK: No synovitis, no joint effusion Neuro: Normal gait, no weakness Psych: Normal mood, full affect   Assessment & Plan:   Moderate persistent asthma: Somewhat improved with mid dose Symbicort.  Escalate to high-dose Symbicort new prescription today.  Dyspnea on exertion: Suspect multifactorial poss related to poorly controlled asthma  as discussed above.  Some cardiac contributors as well with mitral valve regurgitation and diastolic dysfunction noted on TTE 06/2021.  Improvement with inhaler therapy is encouraging.  If no further improvement or worsening in the future consider PFTs.  Lung nodule: Scattered largest 7 mm.  History of smoking.  After shared decision making agreed for repeat CT scan at 1 year interval, 06/2023, per Fleischner criteria.  Order placed today.   Return in about 3 months (around 08/02/2023) for f/u Dr. Judeth Horn, after CT scan.   Karren Burly, MD 05/02/2023   This appointment required 61 minutes of patient care (this includes precharting, chart review, review of results, face-to-face care, etc.).

## 2023-05-02 NOTE — Patient Instructions (Signed)
Nice to meet you  Increase Symbicort to the high-dose.  I sent a new prescription for this.  2 puffs twice a day every day, rinse out your mouth with water after every use.  No changes to medications otherwise  I will order repeat CT scan to be done in October 2024 to keep an eye on the small nodule they saw in October 2023.  Return in 3 months with Dr. Judeth Horn for follow-up, see how you are doing and we can discuss results of CT scan at that time

## 2023-05-11 DIAGNOSIS — N401 Enlarged prostate with lower urinary tract symptoms: Secondary | ICD-10-CM | POA: Diagnosis not present

## 2023-05-11 DIAGNOSIS — R3914 Feeling of incomplete bladder emptying: Secondary | ICD-10-CM | POA: Diagnosis not present

## 2023-05-12 DIAGNOSIS — N400 Enlarged prostate without lower urinary tract symptoms: Secondary | ICD-10-CM | POA: Diagnosis not present

## 2023-05-12 DIAGNOSIS — R339 Retention of urine, unspecified: Secondary | ICD-10-CM | POA: Diagnosis not present

## 2023-05-23 ENCOUNTER — Other Ambulatory Visit: Payer: Self-pay | Admitting: Urology

## 2023-05-24 ENCOUNTER — Other Ambulatory Visit: Payer: Self-pay | Admitting: Urology

## 2023-06-14 IMAGING — MR MR MRA ABDOMEN W/ OR W/O CM
7 of 8 series · 42 of 48 positions shown · IV contrast (agent unspecified)
Comparison: Chest CT-07/20/2021

CT abdomen pelvis-04/11/2016

CLINICAL DATA: Evaluate for renal artery stenosis. Noncontrast
examination performed secondary to acute on chronic renal
insufficiency.

EXAM:
MRI ABDOMEN WITHOUT AND WITH CONTRAST
TECHNIQUE: Multiplanar multisequence MR imaging of the abdomen was performed
both before and after the administration of intravenous contrast.
CONTRAST:  None, given patient's renal insufficiency

[Series 3: T2 · coronal · 5.0mm · 1.56mm/px · 4 of 36 slices shown (1 of 2)]
[im 1/36]
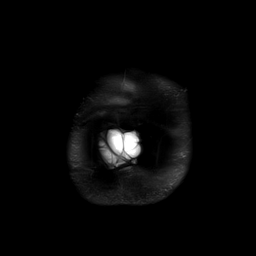
[im 12/36]
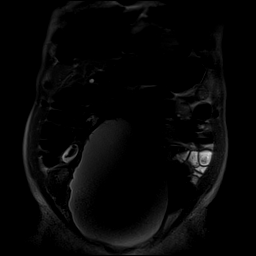
[im 24/36]
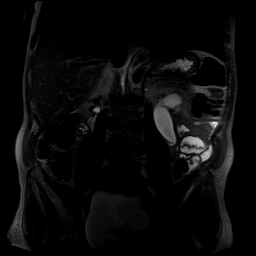
[im 36/36]
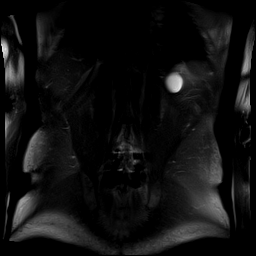

[Series 4: T2 · axial · 5.0mm · 1.48mm/px · z∈[-132,+132]mm · 5 of 45 slices shown (2 of 2)]
[im 1/45]
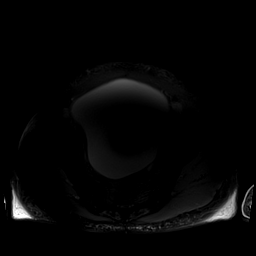
[im 12/45]
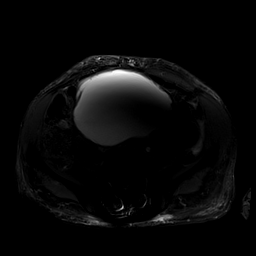
[im 23/45]
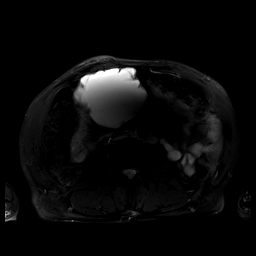
[im 34/45]
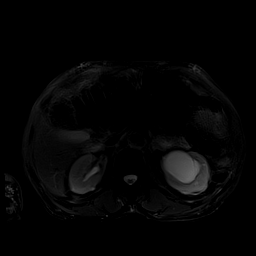
[im 45/45]
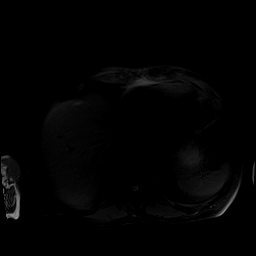

[Series 5: bSSFP · axial · 5.0mm · 1.25mm/px · z∈[-141,+129]mm · 5 of 46 slices shown (1 of 2)]
[im 1/46]
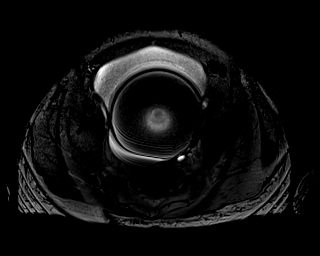
[im 12/46]
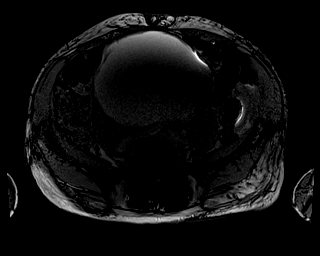
[im 23/46]
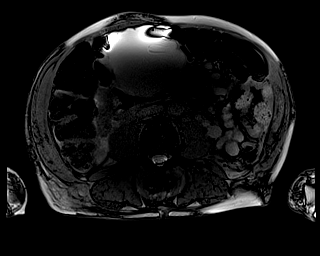
[im 34/46]
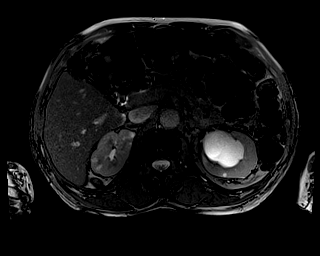
[im 46/46]
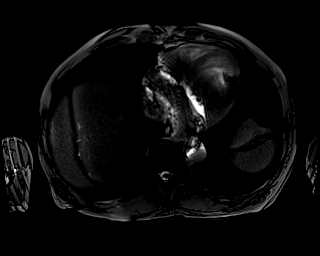

[Series 6: bSSFP · coronal · 5.0mm · 1.25mm/px · 4 of 40 slices shown (2 of 2)]
[im 1/40]
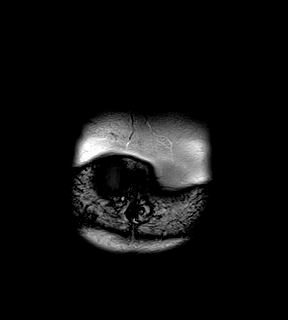
[im 14/40]
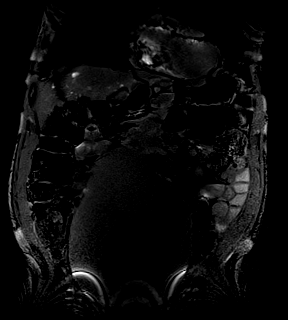
[im 27/40]
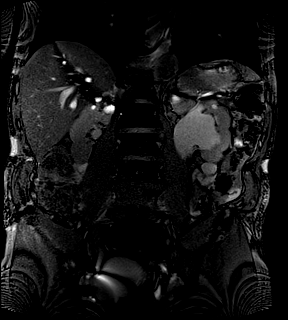
[im 40/40]
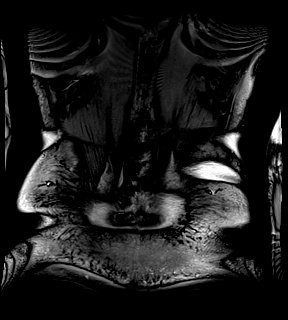

[Series 7: T1 dynamic · axial · non-contrast · 3.0mm · 1.25mm/px · z∈[-137,+124]mm · 9 of 88 slices shown]
[im 1/88]
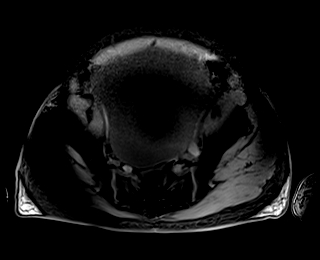
[im 11/88]
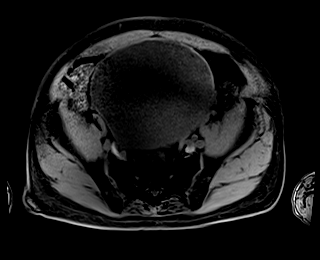
[im 22/88]
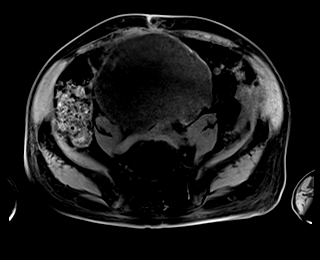
[im 33/88]
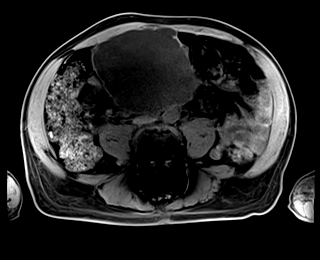
[im 44/88]
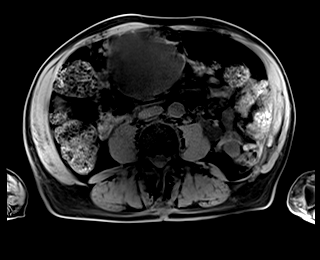
[im 55/88]
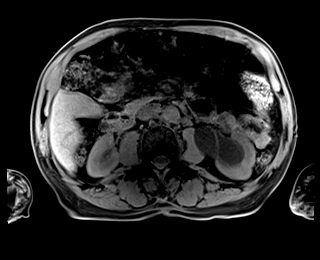
[im 66/88]
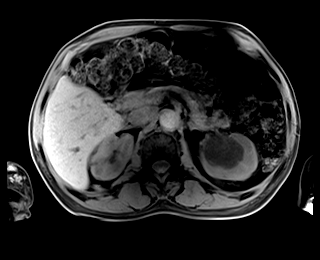
[im 77/88]
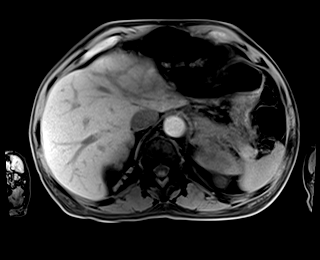
[im 88/88]
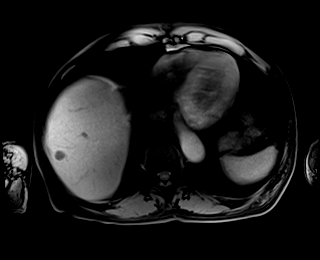

[Series 10: angio_fl3d_cor_pre · coronal · 1.1mm · 1.17mm/px · 13 of 128 slices shown]
[im 1/128]
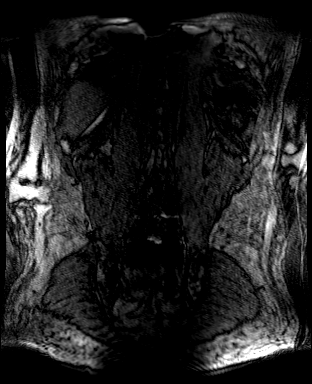
[im 11/128]
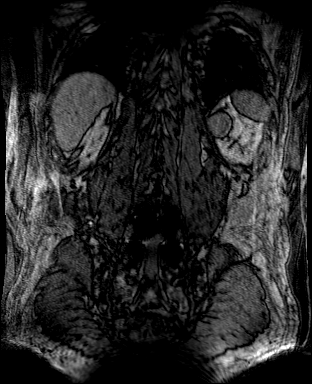
[im 22/128]
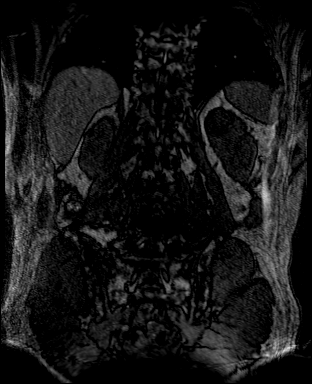
[im 32/128]
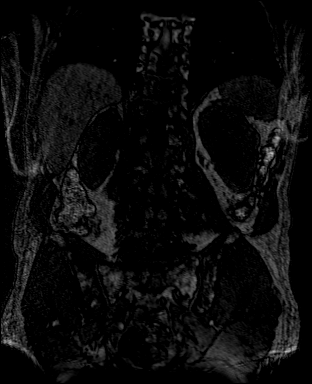
[im 43/128]
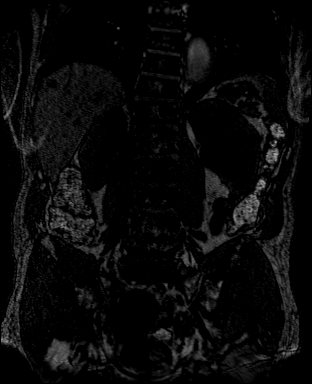
[im 53/128]
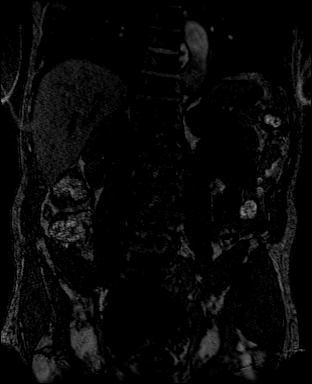
[im 64/128]
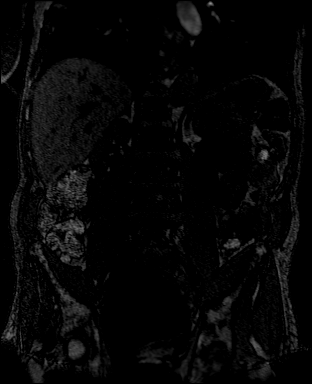
[im 75/128]
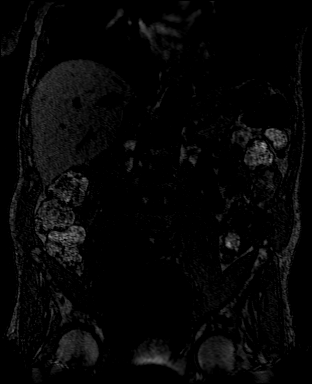
[im 85/128]
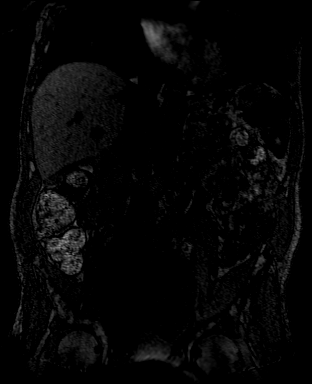
[im 96/128]
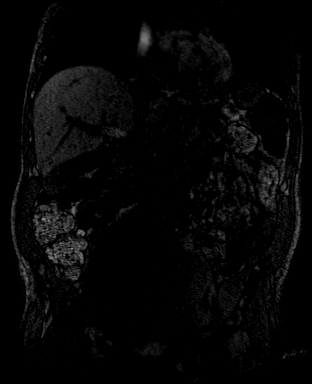
[im 106/128]
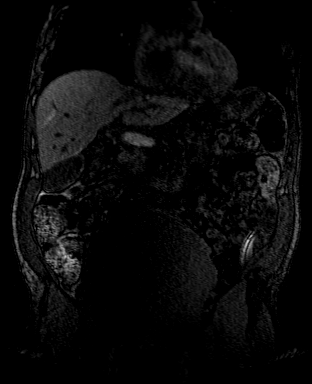
[im 117/128]
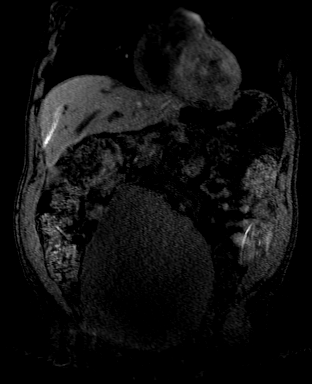
[im 128/128]
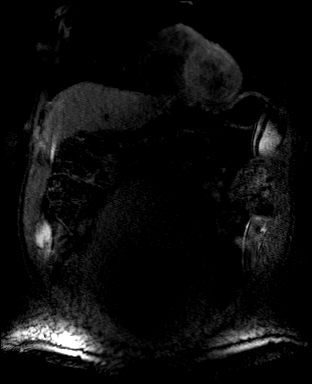

[Series 100: renal lt/rt · sagittal · 1.56mm/px · 2 of 17 slices shown]
[im 1/17]
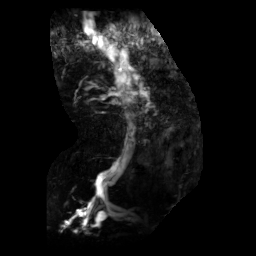
[im 17/17]
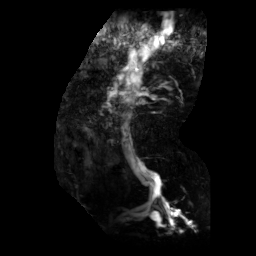

[42 of 48 positions shown; findings below may reference images not displayed]

FINDINGS: The lack of intravenous contrast limits the ability to fully
evaluate abdominal organs as well degrades evaluation of the
abdominal vasculature.

Lower chest: No discrete focal airspace opacities. No pleural
effusion.

Hepatobiliary: Normal hepatic contour. Note is made of an
approximately 1.3 cm T2 intense lesion within the dome of the right
lobe of the liver (image 11, series 6), incompletely characterized
without intravenous contrast though favored to represent a hepatic
cyst. No ascites.

Pancreas:  Normal noncontrast appearance of the pancreas.

Spleen: Normal noncontrast appearance of the spleen. No evidence of
splenomegaly.

Adrenals/Urinary Tract: When compared to chest CT performed [DATE],
there has been development of severe left-sided pelvicaliectasis and
ureterectasis to the level of the urinary bladder. This finding is
associated with marked distension of the urinary bladder which
appears trabeculated about its dome and extends above the level of
the umbilicus. No evidence of right-sided urinary obstruction.

No evidence of asymmetric renal atrophy.

Note is made of bilateral T2 intense renal lesions, the largest of
which within the superior pole of the left kidney measures 3.2 cm
(image 7, series 4) and the largest of which within the right kidney
measures 1.3 cm (image 9, series 4), all of which are incompletely
characterized on present examination though favored to represent
renal cysts. Additional subcentimeter T2 intense renal lesions are
too small to accurately characterize though favored to represent
additional renal cysts.

Stomach/Bowel: Large colonic stool burden without evidence of
enteric obstruction. No significant hiatal hernia.

Vascular/Lymphatic: Normal caliber of the abdominal aorta. The IVC
and pelvic venous systems appear widely patent.

The left renal artery is again noted to be duplicated with tiny
accessory left renal artery which supplies the superior pole the
left kidney. The origin of the right renal artery is noted to be
located somewhat anteriorly along the abdominal aorta. Evaluation
for hemodynamically significant narrowing is degraded secondary to
lack of intravenous contrast.

Musculoskeletal: No definitive acute or aggressive osseous
abnormalities on this noncontrast examination. Moderate DDD of L4-L5
with disc space height loss, endplate irregularity and sclerosis.
IMPRESSION: 1. Severe distention of the urinary bladder (extending superior to
the umbilicus), with associated asymmetric moderate to severe
left-sided ureterectasis and pelvicaliectasis, constellation of
findings worrisome for bladder outlet obstruction with asymmetric
obstruction of the left ureter, likely the etiology of reported
history of acute on chronic renal insufficiency. Urologic
consultation is advised.
2. No evidence of right-sided urinary obstruction.
3. Degraded examination secondary to lack of intravenous contrast,
however there are no secondary findings of renal artery stenosis,
specifically, no asymmetric renal atrophy. If clinical concern
persists for renal artery stenosis, further evaluation with renal
Doppler ultrasound could be performed as indicated. Alternatively,
if patient's renal function improves following urinary diversion,
repeat CTA of the abdomen pelvis could be performed as indicated.

These results will be called to the ordering clinician or
representative by the Radiologist Assistant, and communication
documented in the PACS or [REDACTED].

## 2023-06-18 DIAGNOSIS — R339 Retention of urine, unspecified: Secondary | ICD-10-CM | POA: Diagnosis not present

## 2023-06-18 DIAGNOSIS — N400 Enlarged prostate without lower urinary tract symptoms: Secondary | ICD-10-CM | POA: Diagnosis not present

## 2023-06-22 ENCOUNTER — Encounter (HOSPITAL_BASED_OUTPATIENT_CLINIC_OR_DEPARTMENT_OTHER): Payer: Self-pay | Admitting: Urology

## 2023-06-26 ENCOUNTER — Other Ambulatory Visit: Payer: Medicare HMO

## 2023-06-27 ENCOUNTER — Encounter (HOSPITAL_BASED_OUTPATIENT_CLINIC_OR_DEPARTMENT_OTHER): Payer: Self-pay | Admitting: Urology

## 2023-06-27 ENCOUNTER — Other Ambulatory Visit: Payer: Self-pay

## 2023-06-27 NOTE — Progress Notes (Signed)
Spoke w/ via phone for pre-op interview---pt Lab needs dos---- I stat        Lab results------ COVID test -----patient states asymptomatic no test needed Arrive at -------1045 07-03-2023 NPO after MN NO Solid Food.  Clear liquids from MN until---945 Med rec completed Medications to take morning of surgery -----symbicort, albuterol inhaler prn/bring inhaler, duoneb, hydrazaline, levothyroxine, metoprolol succinate Diabetic medication -----n/a Patient instructed no nail polish to be worn day of surgery Patient instructed to bring photo id and insurance card day of surgery Patient aware to have Driver (ride ) / caregiver   wife beverly carroll  for 24 hours after surgery -  Patient Special Instructions -----none Pre-Op special Instructions -----none Patient verbalized understanding of instructions that were given at this phone interview. Patient denies chest pain, sob, fever, cough at the interview.   Pulmonology lov dr Vilma Meckel 05-02-2023 epic [facility] cardiology dr [name] 07-06-2022 epic Chest ct 07-03-2022 epic Echo 07-05-2021 epic EKG 07-06-2022 epic

## 2023-07-02 MED ORDER — SODIUM CHLORIDE 0.9 % IV SOLN: 3.0000 g | INTRAVENOUS | Status: DC

## 2023-07-03 ENCOUNTER — Ambulatory Visit (HOSPITAL_BASED_OUTPATIENT_CLINIC_OR_DEPARTMENT_OTHER): Payer: Medicare HMO | Admitting: Certified Registered Nurse Anesthetist

## 2023-07-03 ENCOUNTER — Ambulatory Visit (HOSPITAL_BASED_OUTPATIENT_CLINIC_OR_DEPARTMENT_OTHER)
Admission: RE | Admit: 2023-07-03 | Discharge: 2023-07-03 | Disposition: A | Discharge status: Home / Self Care | Payer: Medicare HMO | Attending: Urology | Admitting: Urology

## 2023-07-03 ENCOUNTER — Other Ambulatory Visit: Payer: Self-pay

## 2023-07-03 ENCOUNTER — Encounter (HOSPITAL_BASED_OUTPATIENT_CLINIC_OR_DEPARTMENT_OTHER)
Admission: RE | Disposition: A | Discharge status: Home / Self Care | Payer: Self-pay | Source: Home / Self Care | Attending: Urology

## 2023-07-03 ENCOUNTER — Encounter (HOSPITAL_BASED_OUTPATIENT_CLINIC_OR_DEPARTMENT_OTHER): Payer: Self-pay | Admitting: Urology

## 2023-07-03 DIAGNOSIS — R339 Retention of urine, unspecified: Secondary | ICD-10-CM

## 2023-07-03 DIAGNOSIS — R338 Other retention of urine: Secondary | ICD-10-CM | POA: Insufficient documentation

## 2023-07-03 DIAGNOSIS — Z87891 Personal history of nicotine dependence: Secondary | ICD-10-CM | POA: Diagnosis not present

## 2023-07-03 DIAGNOSIS — N138 Other obstructive and reflux uropathy: Secondary | ICD-10-CM

## 2023-07-03 DIAGNOSIS — I739 Peripheral vascular disease, unspecified: Secondary | ICD-10-CM | POA: Diagnosis not present

## 2023-07-03 DIAGNOSIS — G4733 Obstructive sleep apnea (adult) (pediatric): Secondary | ICD-10-CM | POA: Diagnosis not present

## 2023-07-03 DIAGNOSIS — N401 Enlarged prostate with lower urinary tract symptoms: Secondary | ICD-10-CM

## 2023-07-03 DIAGNOSIS — I5032 Chronic diastolic (congestive) heart failure: Secondary | ICD-10-CM | POA: Diagnosis not present

## 2023-07-03 DIAGNOSIS — I5042 Chronic combined systolic (congestive) and diastolic (congestive) heart failure: Secondary | ICD-10-CM | POA: Diagnosis not present

## 2023-07-03 DIAGNOSIS — E039 Hypothyroidism, unspecified: Secondary | ICD-10-CM

## 2023-07-03 DIAGNOSIS — Z01818 Encounter for other preprocedural examination: Secondary | ICD-10-CM

## 2023-07-03 DIAGNOSIS — Z79899 Other long term (current) drug therapy: Secondary | ICD-10-CM | POA: Insufficient documentation

## 2023-07-03 DIAGNOSIS — I110 Hypertensive heart disease with heart failure: Secondary | ICD-10-CM | POA: Diagnosis not present

## 2023-07-03 HISTORY — PX: TRANSURETHRAL RESECTION OF PROSTATE: SHX73

## 2023-07-03 HISTORY — DX: Dyspnea, unspecified: R06.00

## 2023-07-03 HISTORY — DX: Benign prostatic hyperplasia without lower urinary tract symptoms: N40.0

## 2023-07-03 HISTORY — DX: Other specified health status: Z78.9

## 2023-07-03 LAB — POCT I-STAT, CHEM 8
BUN: 16 mg/dL (ref 8–23)
Calcium, Ion: 1.25 mmol/L (ref 1.15–1.40)
Chloride: 103 mmol/L (ref 98–111)
Creatinine, Ser: 1.1 mg/dL (ref 0.61–1.24)
Glucose, Bld: 89 mg/dL (ref 70–99)
HCT: 34 % — ABNORMAL LOW (ref 39.0–52.0)
Hemoglobin: 11.6 g/dL — ABNORMAL LOW (ref 13.0–17.0)
Potassium: 3.8 mmol/L (ref 3.5–5.1)
Sodium: 138 mmol/L (ref 135–145)
TCO2: 23 mmol/L (ref 22–32)

## 2023-07-03 SURGERY — TURP (TRANSURETHRAL RESECTION OF PROSTATE)
Anesthesia: General | Site: Prostate

## 2023-07-03 MED ORDER — LIDOCAINE 2% (20 MG/ML) 5 ML SYRINGE
INTRAMUSCULAR | Status: DC | PRN
  Administered 2023-07-03: 40 mg via INTRAVENOUS

## 2023-07-03 MED ORDER — FENTANYL CITRATE (PF) 100 MCG/2ML IJ SOLN
INTRAMUSCULAR | Status: DC | PRN
  Administered 2023-07-03: 50 ug via INTRAVENOUS
  Administered 2023-07-03 (×2): 25 ug via INTRAVENOUS

## 2023-07-03 MED ORDER — FENTANYL CITRATE (PF) 100 MCG/2ML IJ SOLN
INTRAMUSCULAR | Status: AC
  Filled 2023-07-03: qty 2

## 2023-07-03 MED ORDER — LACTATED RINGERS IV SOLN: INTRAVENOUS | Status: DC

## 2023-07-03 MED ORDER — OXYCODONE HCL 5 MG PO TABS: 5.0000 mg | ORAL_TABLET | Freq: Once | ORAL | Status: DC | PRN

## 2023-07-03 MED ORDER — SODIUM CHLORIDE 0.9 % IV SOLN
2.0000 g | Freq: Once | INTRAVENOUS | Status: AC
  Administered 2023-07-03: 2 g via INTRAVENOUS
  Filled 2023-07-03: qty 10

## 2023-07-03 MED ORDER — AMPICILLIN-SULBACTAM SODIUM 3 (2-1) G IJ SOLR
INTRAMUSCULAR | Status: AC
  Filled 2023-07-03: qty 8

## 2023-07-03 MED ORDER — MIDAZOLAM HCL 5 MG/5ML IJ SOLN
INTRAMUSCULAR | Status: DC | PRN
  Administered 2023-07-03 (×2): 1 mg via INTRAVENOUS

## 2023-07-03 MED ORDER — LIDOCAINE HCL (PF) 2 % IJ SOLN
INTRAMUSCULAR | Status: AC
  Filled 2023-07-03: qty 5

## 2023-07-03 MED ORDER — NITROFURANTOIN MONOHYD MACRO 100 MG PO CAPS: 100.0000 mg | ORAL_CAPSULE | Freq: Every day | ORAL | 0 refills | Status: DC

## 2023-07-03 MED ORDER — DEXAMETHASONE SODIUM PHOSPHATE 10 MG/ML IJ SOLN
INTRAMUSCULAR | Status: DC | PRN
  Administered 2023-07-03: 5 mg via INTRAVENOUS

## 2023-07-03 MED ORDER — DROPERIDOL 2.5 MG/ML IJ SOLN: 0.6250 mg | Freq: Once | INTRAMUSCULAR | Status: DC | PRN

## 2023-07-03 MED ORDER — ACETAMINOPHEN 10 MG/ML IV SOLN: 1000.0000 mg | Freq: Once | INTRAVENOUS | Status: DC | PRN

## 2023-07-03 MED ORDER — GLYCOPYRROLATE PF 0.2 MG/ML IJ SOSY
PREFILLED_SYRINGE | INTRAMUSCULAR | Status: AC
  Filled 2023-07-03: qty 1

## 2023-07-03 MED ORDER — EPHEDRINE 5 MG/ML INJ
INTRAVENOUS | Status: AC
  Filled 2023-07-03: qty 10

## 2023-07-03 MED ORDER — SODIUM CHLORIDE 0.9 % IR SOLN
Status: DC | PRN
  Administered 2023-07-03 (×2): 6000 mL

## 2023-07-03 MED ORDER — SODIUM CHLORIDE 0.9 % IV SOLN
INTRAVENOUS | Status: AC
  Filled 2023-07-03: qty 100

## 2023-07-03 MED ORDER — ONDANSETRON HCL 4 MG/2ML IJ SOLN
INTRAMUSCULAR | Status: DC | PRN
  Administered 2023-07-03: 4 mg via INTRAVENOUS

## 2023-07-03 MED ORDER — EPHEDRINE 5 MG/ML INJ
INTRAVENOUS | Status: AC
  Filled 2023-07-03: qty 5

## 2023-07-03 MED ORDER — PROPOFOL 10 MG/ML IV BOLUS
INTRAVENOUS | Status: AC
  Filled 2023-07-03: qty 20

## 2023-07-03 MED ORDER — EPHEDRINE SULFATE-NACL 50-0.9 MG/10ML-% IV SOSY
PREFILLED_SYRINGE | INTRAVENOUS | Status: DC | PRN
Start: 2023-07-03 — End: 2023-07-03
  Administered 2023-07-03: 5 mg via INTRAVENOUS
  Administered 2023-07-03 (×3): 10 mg via INTRAVENOUS

## 2023-07-03 MED ORDER — DEXAMETHASONE SODIUM PHOSPHATE 10 MG/ML IJ SOLN
INTRAMUSCULAR | Status: AC
  Filled 2023-07-03: qty 1

## 2023-07-03 MED ORDER — ACETAMINOPHEN 160 MG/5ML PO SOLN: 325.0000 mg | ORAL | Status: DC | PRN

## 2023-07-03 MED ORDER — 0.9 % SODIUM CHLORIDE (POUR BTL) OPTIME
TOPICAL | Status: DC | PRN
Start: 2023-07-03 — End: 2023-07-03
  Administered 2023-07-03: 1000 mL

## 2023-07-03 MED ORDER — OXYCODONE HCL 5 MG/5ML PO SOLN: 5.0000 mg | Freq: Once | ORAL | Status: DC | PRN

## 2023-07-03 MED ORDER — PROPOFOL 10 MG/ML IV BOLUS
INTRAVENOUS | Status: DC | PRN
  Administered 2023-07-03: 200 mg via INTRAVENOUS
  Administered 2023-07-03 (×2): 20 mg via INTRAVENOUS

## 2023-07-03 MED ORDER — GLYCOPYRROLATE PF 0.2 MG/ML IJ SOSY
PREFILLED_SYRINGE | INTRAMUSCULAR | Status: DC | PRN
Start: 2023-07-03 — End: 2023-07-03
  Administered 2023-07-03: .2 mg via INTRAVENOUS

## 2023-07-03 MED ORDER — ONDANSETRON HCL 4 MG/2ML IJ SOLN
INTRAMUSCULAR | Status: AC
  Filled 2023-07-03: qty 2

## 2023-07-03 MED ORDER — ACETAMINOPHEN 325 MG PO TABS: 325.0000 mg | ORAL_TABLET | ORAL | Status: DC | PRN

## 2023-07-03 MED ORDER — MIDAZOLAM HCL 2 MG/2ML IJ SOLN
INTRAMUSCULAR | Status: AC
  Filled 2023-07-03: qty 2

## 2023-07-03 MED ORDER — FENTANYL CITRATE (PF) 100 MCG/2ML IJ SOLN: 25.0000 ug | INTRAMUSCULAR | Status: DC | PRN

## 2023-07-03 SURGICAL SUPPLY — 33 items
BAG DRAIN URO-CYSTO SKYTR STRL (DRAIN) ×1 IMPLANT
BAG DRN RND TRDRP ANRFLXCHMBR (UROLOGICAL SUPPLIES) ×1
BAG DRN UROCATH (DRAIN) ×1
BAG URINE DRAIN 2000ML AR STRL (UROLOGICAL SUPPLIES) ×1 IMPLANT
BAG URINE LEG 500ML (DRAIN) IMPLANT
BAND INSRT 18 STRL LF DISP RB (MISCELLANEOUS) ×1
BAND RUBBER #18 3X1/16 STRL (MISCELLANEOUS) IMPLANT
CATH FOLEY 2WAY SLVR 30CC 22FR (CATHETERS) IMPLANT
CATH FOLEY 3WAY 30CC 22FR (CATHETERS) ×1 IMPLANT
CATH HEMA 3WAY 30CC 24FR COUDE (CATHETERS) IMPLANT
CATH HEMA 3WAY 30CC 24FR RND (CATHETERS) IMPLANT
CLOTH BEACON ORANGE TIMEOUT ST (SAFETY) ×1 IMPLANT
ELECT REM PT RETURN 9FT ADLT (ELECTROSURGICAL) ×1
ELECTRODE REM PT RTRN 9FT ADLT (ELECTROSURGICAL) ×1 IMPLANT
EVACUATOR MICROVAS BLADDER (UROLOGICAL SUPPLIES) IMPLANT
GLOVE BIO SURGEON STRL SZ7.5 (GLOVE) ×1 IMPLANT
GLOVE BIO SURGEON STRL SZ8 (GLOVE) IMPLANT
GOWN STRL REUS W/TWL LRG LVL3 (GOWN DISPOSABLE) ×1 IMPLANT
HOLDER FOLEY CATH W/STRAP (MISCELLANEOUS) IMPLANT
IV NS IRRIG 3000ML ARTHROMATIC (IV SOLUTION) IMPLANT
KIT TURNOVER CYSTO (KITS) ×1 IMPLANT
LOOP CUT BIPOLAR 24F LRG (ELECTROSURGICAL) ×1 IMPLANT
MANIFOLD NEPTUNE II (INSTRUMENTS) ×1 IMPLANT
NS IRRIG 1000ML POUR BTL (IV SOLUTION) IMPLANT
PACK CYSTO (CUSTOM PROCEDURE TRAY) ×1 IMPLANT
PIN SAFETY STERILE (MISCELLANEOUS) IMPLANT
PLUG CATH AND CAP STRL 200 (CATHETERS) IMPLANT
SLEEVE SCD COMPRESS KNEE MED (STOCKING) ×1 IMPLANT
SYR 30ML LL (SYRINGE) ×1 IMPLANT
SYR TOOMEY IRRIG 70ML (MISCELLANEOUS)
SYRINGE TOOMEY IRRIG 70ML (MISCELLANEOUS) ×1 IMPLANT
TUBE CONNECTING 12X1/4 (SUCTIONS) ×1 IMPLANT
TUBING UROLOGY SET (TUBING) ×1 IMPLANT

## 2023-07-03 NOTE — Op Note (Signed)
Preoperative diagnosis: BPH, urinary retention Postoperative diagnosis: Same  Procedure: TURP  Surgeon: Mena Goes  Anesthesia: General  Indication for procedure: Peter [name] is a 72 year old male with a history of BPH and retention.  He underwent aqua ablation of prostate and was able to void but not completely.  He learn CIC.  Office cystoscopy revealed some residual obstruction and intravesical prostate.  He was brought today for TURP.  Findings: On cystoscopy the urethra was unremarkable, prostatic urethra was obstructed primarily by residual left lateral lobe tissue which had fused and about the mid prostatic urethra with some residual right prostatic tissue obstructing the prostatic urethra.  The trigone and ureteral orifice ease were identified.  There were noted to be normal pre and post resection.  He has a large capacity bladder.  That in and of itself may make it difficult for him to void.  He may have some hypotonicity.  There were no mucosal lesions, stones or foreign body in the bladder.  Description of procedure: After consent was obtained patient brought to the operating room.  After adequate anesthesia he was placed lithotomy position and prepped and draped in the usual sterile fashion.  Timeout was performed to confirm the patient and procedure.  Cystoscope was passed per urethra with the continuous-flow sheath and visual obturator and the bladder inspected.  The loop and the handle were passed.  Again the UOs were identified and I came in in line with the left ureteral orifice at about 5:00 patient's left side and resected some residual prostate tissue down to the bladder neck and then brought that incision down to the verumontanum.  Separated some of the apical fusion.  The left lateral lobe still had a good bit of tissue and it went at the bladder neck anteriorly and resected back to the bladder neck and then anterior to posterior bladder neck down to the very resected the left lateral  lobe.  This nicely opened the channel.  I made a similar incision at 7:00 on the patient's right and brought that down to the verumontanum.  Some residual right lateral lobe was resected but was only about a third of the tissue compared to the left side.  This created an excellent channel.  All the chips were evacuated.  Hemostasis was excellent at low pressure.  The scope was backed out and a 22 [place] two-way catheter was advanced.  Irrigation was clear.  I put about 25 cc in the balloon and seated that at the bladder neck.  Light rubber band traction was applied for wake-up.  He was awakened taken the cover room in stable condition.  Complications: None  Blood loss: Minimal  Specimens: TURP chips to pathology  Drains: 22 French two-way catheter  Disposition: Patient stable to PACU-I called and discussed the procedure, postop care and follow-up with [facility].

## 2023-07-03 NOTE — Transfer of Care (Signed)
Immediate Anesthesia Transfer of Care Note  Patient: Peter [name]  Procedure(s) Performed: TRANSURETHRAL RESECTION OF THE PROSTATE (TURP) (Prostate)  Patient Location: PACU  Anesthesia Type:General  Level of Consciousness: drowsy, patient cooperative, and responds to stimulation  Airway & Oxygen Therapy: Patient Spontanous Breathing and Patient connected to nasal cannula oxygen  Post-op Assessment: Report given to RN and Post -op Vital signs reviewed and stable  Post vital signs: Reviewed and stable  Last Vitals:  Vitals Value Taken Time  BP 148/88 07/03/23 1417  Temp    Pulse 71 07/03/23 1418  Resp 13 07/03/23 1418  SpO2 100 % 07/03/23 1418  Vitals shown include unfiled device data.  Last Pain:  Vitals:   07/03/23 1127  TempSrc: Oral  PainSc: 0-No pain      Patients Stated Pain Goal: 3 (07/03/23 1127)  Complications: No notable events documented.

## 2023-07-03 NOTE — Anesthesia Preprocedure Evaluation (Signed)
Anesthesia Evaluation  Patient identified by MRN, date of birth, ID band Patient awake    Reviewed: Allergy & Precautions, NPO status , Patient's Chart, lab work & pertinent test results, reviewed documented beta blocker date and time   Airway Mallampati: I  TM Distance: >3 FB Neck ROM: Full    Dental  (+) Poor Dentition, Missing, Chipped, Dental Advisory Given   Pulmonary asthma , sleep apnea , former smoker   breath sounds clear to auscultation       Cardiovascular hypertension, Pt. on medications and Pt. on home beta blockers + Peripheral Vascular Disease and +CHF   Rhythm:Regular Rate:Normal     Neuro/Psych negative neurological ROS     GI/Hepatic negative GI ROS,,,(+)     substance abuse    Endo/Other  Hypothyroidism    Renal/GU Renal disease     Musculoskeletal  (+) Arthritis ,    Abdominal   Peds  Hematology  (+) Blood dyscrasia, anemia   Anesthesia Other Findings   Reproductive/Obstetrics                             Anesthesia Physical Anesthesia Plan  ASA: 3  Anesthesia Plan: General   Post-op Pain Management: Tylenol PO (pre-op)*   Induction: Intravenous  PONV Risk Score and Plan: 3 and Ondansetron and Treatment may vary due to age or medical condition  Airway Management Planned: LMA  Additional Equipment: None  Intra-op Plan:   Post-operative Plan: Extubation in OR  Informed Consent: I have reviewed the patients History and Physical, chart, labs and discussed the procedure including the risks, benefits and alternatives for the proposed anesthesia with the patient or authorized representative who has indicated his/her understanding and acceptance.     Dental advisory given  Plan Discussed with: CRNA  Anesthesia Plan Comments:        Anesthesia Quick Evaluation

## 2023-07-03 NOTE — Discharge Instructions (Signed)

## 2023-07-03 NOTE — Anesthesia Postprocedure Evaluation (Signed)
Anesthesia Post Note  Patient: Peter [name]  Procedure(s) Performed: TRANSURETHRAL RESECTION OF THE PROSTATE (TURP) (Prostate)     Patient location during evaluation: PACU Anesthesia Type: General Level of consciousness: awake and alert Pain management: pain level controlled Vital Signs Assessment: post-procedure vital signs reviewed and stable Respiratory status: spontaneous breathing, nonlabored ventilation, respiratory function stable and patient connected to nasal cannula oxygen Cardiovascular status: blood pressure returned to baseline and stable Postop Assessment: no apparent nausea or vomiting Anesthetic complications: no  No notable events documented.  Last Vitals:  Vitals:   07/03/23 1445 07/03/23 1500  BP: (!) 165/96 (!) 175/99  Pulse: 66 64  Resp: 20 14  Temp:    SpO2: 96% 96%    Last Pain:  Vitals:   07/03/23 1500  TempSrc:   PainSc: 0-No pain                 Shelton Silvas

## 2023-07-03 NOTE — H&P (Signed)
H&P  Chief Complaint: BPH with urinary retention  History of Present Illness: Peter [name] is a 72 year old male with a history of BPH and urinary retention.  He had a catheter for a prolonged time.  He was on maximal medical therapy and remains on finasteride. He had a 73 g prostate on imaging.  He underwent aqua ablation February 2024.  He continued to have some incomplete emptying and retention.  Some frequency and urgency.  He learn CIC.  His August 2024 cystoscopy revealed some residual bladder outlet obstruction from BPH and possibly some continued intravesical component.  He is brought today for TURP.  He has been doing well.  He is noticed less frequency and urgency and more voiding on his own.  Especially after he takes Lasix.  He had no dysuria or gross hematuria no fever.  No cough cold or congestion.  Past Medical History:  Diagnosis Date   Abnormal CK 03/11/2013   Acute blood loss anemia 04/11/2016   Acute combined systolic and diastolic heart failure (HCC) 03/11/2013   Acute renal insufficiency 08/13/2013   Allergy    Arthritis    right knee   Asthma    Back pain    BPH (benign prostatic hyperplasia)    Colon polyps 2003, 2015   2003: hyperplastic. 05/2014: tubular adenoma   Complication of anesthesia    problems urinating after anesthesia   Diverticulosis of colon 2003, 2015   Dyspnea    with heavy exertion   Hypertension    Hypothyroidism    had Graves' disease - thyroid ablation   MRSA (methicillin resistant Staphylococcus aureus) infection    lumbar-2015   Obstructive sleep apnea syndrome 09/22/2014   Opiate use 10/02/2017   S/P radioactive iodine thyroid ablation 06/04/2017   Self-catheterizes urinary bladder    bid   Substance abuse (HCC)    not in many years   Viral cardiomyopathy (HCC) 2014   per Cath by Dr. [name] in 2014   Wears glasses    Past Surgical History:  Procedure Laterality Date   ANTERIOR CERVICAL DECOMP/DISCECTOMY FUSION N/A 07/30/2013    Procedure: CERVICAL FIVE,CERVICAL SIX CORPECTOMY,ANTERIOR ARTHRODESIS,PEEK INTERBODY GRAFT CERVICAL FOUR-CERVICAL SEVEN,ANTERIOR INSTRUMENTATION;  Surgeon: Carmela Hurt, MD;  Location: [facility];  Service: Neurosurgery;  Laterality: N/A;   ANTERIOR CERVICAL DECOMP/DISCECTOMY FUSION N/A 04/16/2015   Procedure: CERVICAL THREE-FOUR ANTERIOR CERVICAL DECOMPRESSION/DISCECTOMY FUSION 1 LEVEL;  Surgeon: Coletta Memos, MD;  Location: [facility];  Service: Neurosurgery;  Laterality: N/A;  C34 anterior cervical decompression with fusion plating and bonegraft   BACK SURGERY  1/15   lumb fusion   CARDIAC CATHETERIZATION  2014   CARPAL TUNNEL RELEASE Right 03/23/2014   Procedure: RIGHT CARPAL TUNNEL RELEASE;  Surgeon: Tami Ribas, MD;  Location: [facility];  Service: Orthopedics;  Laterality: Right;   COLONOSCOPY  2003, 2015   LEFT AND RIGHT HEART CATHETERIZATION WITH CORONARY ANGIOGRAM N/A 03/11/2013   Procedure: LEFT AND RIGHT HEART CATHETERIZATION WITH CORONARY ANGIOGRAM;  Surgeon: Pamella Pert, MD;  Location: [facility];  Service: Cardiovascular;  Laterality: N/A;   LUMBAR WOUND DEBRIDEMENT N/A 11/07/2013   Procedure: LUMBAR WOUND DEBRIDEMENT;  Surgeon: Carmela Hurt, MD;  Location: [facility];  Service: Neurosurgery;  Laterality: N/A;   TONSILLECTOMY  1958    Home Medications:  Medications Prior to Admission  Medication Sig Dispense Refill Last Dose   acetaminophen (TYLENOL) 500 MG tablet Take 1,000 mg by mouth 2 (two) times daily as needed  for moderate pain.      albuterol (VENTOLIN HFA) 108 (90 Base) MCG/ACT inhaler Inhale 2 puffs into the lungs every 8 (eight) hours as needed for shortness of breath or wheezing.      budesonide-formoterol (SYMBICORT) 160-4.5 MCG/ACT inhaler Inhale 2 puffs into the lungs in the morning and at bedtime. 1 each 6    cetirizine (ZYRTEC) 10 MG tablet Take 10 mg by mouth daily as needed for allergies or rhinitis.      docusate sodium (COLACE)  100 MG capsule Take 500 mg by mouth daily as needed for mild constipation.      doxycycline (ADOXA) 100 MG tablet Take 1 tablet (100 mg total) by mouth at bedtime. (Patient not taking: Reported on 06/27/2023) 10 tablet 0 Not Taking   finasteride (PROSCAR) 5 MG tablet Take 5 mg by mouth every evening.      furosemide (LASIX) 20 MG tablet Take 20 mg by mouth daily as needed for fluid.      hydrALAZINE (APRESOLINE) 50 MG tablet Take 1 tablet (50 mg total) by mouth daily. 90 tablet 3    ipratropium-albuterol (DUONEB) 0.5-2.5 (3) MG/3ML SOLN Take 3 mLs by nebulization every 4 (four) hours. (Patient taking differently: Take 3 mLs by nebulization every 4 (four) hours as needed (Shortness of breath).) 360 mL 0    levothyroxine (SYNTHROID) 150 MCG tablet Take 150 mcg by mouth daily before breakfast.      metoprolol succinate (TOPROL-XL) 100 MG 24 hr tablet Take 100 mg by mouth daily.       montelukast (SINGULAIR) 10 MG tablet Take 10 mg by mouth at bedtime.      Omega-3 Fatty Acids (OMEGA-3 PO) Take 2 capsules by mouth at bedtime.      oxyCODONE (ROXICODONE) 5 MG immediate release tablet Take 1 tablet (5 mg total) by mouth every 4 (four) hours as needed for severe pain. (Patient taking differently: Take 10 mg by mouth 2 (two) times daily.) 45 tablet 0    Pseudoephedrine HCl (SUDAFED 12 HOUR PO) Take 2 tablets by mouth at bedtime as needed (congestion).      senna (SENOKOT) 8.6 MG tablet Take 1 tablet by mouth daily as needed for constipation.      Allergies:  Allergies  Allergen Reactions   Benicar [Olmesartan] Other (See Comments)    Acute renal failure   Keflex [Cephalexin] Anaphylaxis   Amlodipine Swelling   Aspirin Other (See Comments)    Exacerbates asthma attacks.    Tamsulosin Other (See Comments)    "Felt bad, made Pt irritable"     Family History  Problem Relation Age of Onset   Diabetes Mother    Hypertension Mother    Pancreatic cancer Mother    Colon cancer Neg Hx    Social  History:  reports that he quit smoking about 27 years ago. His smoking use included cigarettes. He started smoking about 27 years ago. He has never used smokeless tobacco. He reports that he does not drink alcohol and does not use drugs.  ROS: A complete review of systems was performed.  All systems are negative except for pertinent findings as noted. Review of Systems  All other systems reviewed and are negative.    Physical Exam:  Vital signs in last 24 hours:   General:  Alert and oriented, No acute distress HEENT: Normocephalic, atraumatic Cardiovascular: Regular rate and rhythm Lungs: Regular rate and effort Abdomen: Soft, nontender, nondistended, no abdominal masses Back: No CVA tenderness Extremities: No  edema Neurologic: Grossly intact  Laboratory Data:  No results found for this or any previous visit (from the past 24 hour(s)). No results found for this or any previous visit (from the past 240 hour(s)). Creatinine: No results for input(s): "CREATININE" in the last 168 hours.  Impression/Assessment/plan:  I discussed with the patient the nature, potential benefits, risks and alternatives to transurethral resection of prostate, including side effects of the proposed treatment, the likelihood of the patient achieving the goals of the procedure, and any potential problems that might occur during the procedure or recuperation.  Discussed risk of bleeding, infection, incontinence and stricture among others.  Also discussed continued failure and need for CIC.  Again reminded him it takes a functioning bladder and an open outlet to void.  We are trying to give his bladder the best odds of emptying but most importantly keep him safe and not make him worse. All questions answered. Patient elects to proceed.  Jerilee Field 07/03/2023, 11:05 AM

## 2023-07-03 NOTE — Anesthesia Procedure Notes (Signed)
Procedure Name: LMA Insertion Date/Time: 07/03/2023 1:14 PM  Performed by: Bishop Limbo, CRNAPre-anesthesia Checklist: Patient identified, Emergency Drugs available, Suction available and Patient being monitored Patient Re-evaluated:Patient Re-evaluated prior to induction Oxygen Delivery Method: Circle System Utilized Preoxygenation: Pre-oxygenation with 100% oxygen Induction Type: IV induction Ventilation: Mask ventilation without difficulty LMA: LMA inserted LMA Size: 5.0 Number of attempts: 1 Placement Confirmation: positive ETCO2 Tube secured with: Tape Dental Injury: Teeth and Oropharynx as per pre-operative assessment

## 2023-07-04 ENCOUNTER — Ambulatory Visit
Admission: RE | Admit: 2023-07-04 | Discharge: 2023-07-04 | Disposition: A | Discharge status: Home / Self Care | Payer: Medicare HMO | Source: Ambulatory Visit | Attending: Pulmonary Disease | Admitting: Pulmonary Disease

## 2023-07-04 ENCOUNTER — Encounter (HOSPITAL_BASED_OUTPATIENT_CLINIC_OR_DEPARTMENT_OTHER): Payer: Self-pay | Admitting: Urology

## 2023-07-04 DIAGNOSIS — R918 Other nonspecific abnormal finding of lung field: Secondary | ICD-10-CM | POA: Diagnosis not present

## 2023-07-04 LAB — SURGICAL PATHOLOGY

## 2023-07-05 ENCOUNTER — Ambulatory Visit: Payer: Self-pay | Admitting: Cardiology

## 2023-07-05 DIAGNOSIS — J45909 Unspecified asthma, uncomplicated: Secondary | ICD-10-CM | POA: Diagnosis not present

## 2023-07-05 DIAGNOSIS — I130 Hypertensive heart and chronic kidney disease with heart failure and stage 1 through stage 4 chronic kidney disease, or unspecified chronic kidney disease: Secondary | ICD-10-CM | POA: Diagnosis not present

## 2023-07-05 DIAGNOSIS — N401 Enlarged prostate with lower urinary tract symptoms: Secondary | ICD-10-CM | POA: Diagnosis not present

## 2023-07-05 DIAGNOSIS — R911 Solitary pulmonary nodule: Secondary | ICD-10-CM | POA: Diagnosis not present

## 2023-07-18 DIAGNOSIS — N318 Other neuromuscular dysfunction of bladder: Secondary | ICD-10-CM | POA: Diagnosis not present

## 2023-07-18 DIAGNOSIS — R8271 Bacteriuria: Secondary | ICD-10-CM | POA: Diagnosis not present

## 2023-08-13 DIAGNOSIS — R339 Retention of urine, unspecified: Secondary | ICD-10-CM | POA: Diagnosis not present

## 2023-08-13 DIAGNOSIS — N400 Enlarged prostate without lower urinary tract symptoms: Secondary | ICD-10-CM | POA: Diagnosis not present

## 2023-08-14 ENCOUNTER — Encounter: Payer: Self-pay | Admitting: Physician Assistant

## 2023-08-14 DIAGNOSIS — R339 Retention of urine, unspecified: Secondary | ICD-10-CM | POA: Diagnosis not present

## 2023-08-14 DIAGNOSIS — N400 Enlarged prostate without lower urinary tract symptoms: Secondary | ICD-10-CM | POA: Diagnosis not present

## 2023-08-16 DIAGNOSIS — R8271 Bacteriuria: Secondary | ICD-10-CM | POA: Diagnosis not present

## 2023-08-16 DIAGNOSIS — R3914 Feeling of incomplete bladder emptying: Secondary | ICD-10-CM | POA: Diagnosis not present

## 2023-08-16 DIAGNOSIS — N3000 Acute cystitis without hematuria: Secondary | ICD-10-CM | POA: Diagnosis not present

## 2023-09-03 ENCOUNTER — Telehealth: Payer: Self-pay | Admitting: Pulmonary Disease

## 2023-09-03 NOTE — Telephone Encounter (Signed)
DRI disk will be placed in Dr.Hunsucker's box.

## 2023-09-04 ENCOUNTER — Ambulatory Visit: Payer: Medicare HMO | Attending: Cardiology | Admitting: Cardiology

## 2023-09-04 ENCOUNTER — Encounter: Payer: Self-pay | Admitting: Cardiology

## 2023-09-04 VITALS — BP 110/66 | HR 98 | Resp 16 | Ht 74.0 in | Wt 184.2 lb

## 2023-09-04 DIAGNOSIS — I10 Essential (primary) hypertension: Secondary | ICD-10-CM

## 2023-09-04 DIAGNOSIS — I5032 Chronic diastolic (congestive) heart failure: Secondary | ICD-10-CM | POA: Diagnosis not present

## 2023-09-04 NOTE — Progress Notes (Signed)
Cardiology Office Note:  .   Date:  09/04/2023  ID:  Peter [name], DOB 08-14-1951, MRN [id number] PCP: Creola Corn, MD  [facility] Providers Cardiologist:  Yates Decamp, MD   History of Present Illness: .   Peter [name] is a 72 y.o. African-American male with a history of nonischemic cardiomyopathy with severe LV systolic dysfunction, cardiac catheterization  03/11/2013 had revealed no significant coronary artery disease, ejection fraction 25%, normalization of LVEF by medical therapy.   He has history of hypertension, very mild hyperlipidemia, OSA on CPAP, statins were discontinued as he had had markedly elevated CK enzymes and rhabdomyolysis, radioactive iodine therapy for hyperthyroidismin 2014 and suspect hyperthyroidism to be the etiology for his rhabdomyolysis.   Discussed the use of AI scribe software for clinical note transcription with the patient, who gave verbal consent to proceed.  History of Present Illness   The patient, with a history of heart failure, hypertension, and prostate issues, has been maintaining a lower weight, which has helped manage his heart condition. The patient has been adhering to a strict diet, with occasional indulgences such as a bag of potato chips once a month. The patient's blood pressure is well controlled, and he is on metoprolol, hydralazine, and Lasix. The patient takes Lasix once a week, which he finds helps with his prostate issues. The patient also has back pain, which is managed with a brace and pain medication. The patient is scheduled for a colonoscopy and is seeing a pulmonologist for respiratory issues.      Review of Systems  Cardiovascular:  Positive for dyspnea on exertion (stable). Negative for chest pain and leg swelling.    Labs    Lab Results  Component Value Date   NA 138 07/03/2023   K 3.8 07/03/2023   CO2 25 10/19/2022   GLUCOSE 89 07/03/2023   BUN 16 07/03/2023   CREATININE 1.10 07/03/2023   CALCIUM 9.1  10/19/2022   EGFR 68 10/24/2021   GFRNONAA >60 10/19/2022      Latest Ref Rng & Units 07/03/2023   11:21 AM 10/19/2022    2:06 PM 09/06/2022    2:30 PM  BMP  Glucose 70 - 99 mg/dL 89  84  70   BUN 8 - 23 mg/dL 16  19  16    Creatinine 0.61 - 1.24 mg/dL 4.09  8.11  9.14   Sodium 135 - 145 mmol/L 138  137  139   Potassium 3.5 - 5.1 mmol/L 3.8  4.4  4.7   Chloride 98 - 111 mmol/L 103  106  108   CO2 22 - 32 mmol/L  25  26   Calcium 8.9 - 10.3 mg/dL  9.1  9.0       Latest Ref Rng & Units 07/03/2023   11:21 AM 10/19/2022    2:06 PM 09/06/2022    2:30 PM  CBC  WBC 4.0 - 10.5 K/uL  2.7  2.4   Hemoglobin 13.0 - 17.0 g/dL 78.2  95.6  21.3   Hematocrit 39.0 - 52.0 % 34.0  36.9  36.7   Platelets 150 - 400 K/uL  171  199    External Labs:  Labs 04/19/2023: Total cholesterol 148, triglycerides 67, HDL 49, LDL 86.  A1c 5.3%.  TSH normal at 1.440.  Physical Exam:   VS:  BP 110/66 (BP Location: Left Arm, Patient Position: Sitting, Cuff Size: Normal)   Pulse 98   Resp 16   Ht 6\' 2"  (1.88 m)  Wt 184 lb 3.2 oz (83.6 kg)   SpO2 98%   BMI 23.65 kg/m    Wt Readings from Last 3 Encounters:  09/04/23 184 lb 3.2 oz (83.6 kg)  07/03/23 184 lb (83.5 kg)  05/02/23 184 lb 12.8 oz (83.8 kg)     Physical Exam Neck:     Vascular: No carotid bruit or JVD.  Cardiovascular:     Rate and Rhythm: Normal rate and regular rhythm.     Pulses: Intact distal pulses.     Heart sounds: Normal heart sounds. No murmur heard.    No gallop.  Pulmonary:     Effort: Pulmonary effort is normal.     Breath sounds: Normal breath sounds.  Abdominal:     General: Bowel sounds are normal.     Palpations: Abdomen is soft.  Musculoskeletal:     Right lower leg: No edema.     Left lower leg: No edema.     Studies Reviewed: .    Echocardiogram 07/05/2021: Left ventricle cavity is normal in size. Moderate concentric hypertrophy of the left ventricle. Normal global wall motion. Normal LV systolic function  with EF 55%. Doppler evidence of grade I (impaired) diastolic dysfunction, normal LAP. Moderate (Grade II) mitral regurgitation. Mild to moderate tricuspid regurgitation. Peak RA-RV gradient 19 mmHg. IVC is not seen. Mild pulmonic regurgitation. The aortic root is upper limit normal at 3.8 cm.  CT chest for lung cancer screening 07/04/2023: Cardiovascular: The heart is normal in size. No pericardial effusion. No evidence of thoracic aortic aneurysm. Ascending thoracic aorta measures 3.7 cm, within normal limits. Mild atherosclerotic calcifications of the aortic arch. Lungs/Pleura: 4 mm right middle lobe nodule (series 6/image 87), grossly unchanged. 7 x 5 mm subpleural nodule in the left lower lobe (series 6/image 92), unchanged. These can both be considered benign.  Compared to CT scan 07/03/2022, ascending aortic measurement of 4 cm was felt to be erroneous.  EKG:    EKG Interpretation Date/Time:  Tuesday September 04 2023 16:22:58 EST Ventricular Rate:  88 PR Interval:  150 QRS Duration:  106 QT Interval:  380 QTC Calculation: 459 R Axis:   35  Text Interpretation: EKG 09/04/2023: Normal sinus rhythm at rate of 88 bpm, left atrial enlargement, LVH.  Compared to 08/01/2021, no significant change. Confirmed by Delrae Rend [phone]) on 09/04/2023 4:42:52 PM    Medications and allergies    Allergies  Allergen Reactions   Benicar [Olmesartan] Other (See Comments)    Acute renal failure   Keflex [Cephalexin] Anaphylaxis   Amlodipine Swelling   Aspirin Other (See Comments)    Exacerbates asthma attacks.    Tamsulosin Other (See Comments)    "Felt bad, made Pt irritable"      Current Outpatient Medications:    acetaminophen (TYLENOL) 500 MG tablet, Take 1,000 mg by mouth 2 (two) times daily as needed for moderate pain., Disp: , Rfl:    albuterol (VENTOLIN HFA) 108 (90 Base) MCG/ACT inhaler, Inhale 2 puffs into the lungs every 8 (eight) hours as needed for shortness of breath or  wheezing., Disp: , Rfl:    budesonide-formoterol (SYMBICORT) 160-4.5 MCG/ACT inhaler, Inhale 2 puffs into the lungs in the morning and at bedtime., Disp: 1 each, Rfl: 6   cetirizine (ZYRTEC) 10 MG tablet, Take 10 mg by mouth daily as needed for allergies or rhinitis., Disp: , Rfl:    docusate sodium (COLACE) 100 MG capsule, Take 500 mg by mouth daily as needed for mild constipation., Disp: ,  Rfl:    finasteride (PROSCAR) 5 MG tablet, Take 5 mg by mouth every evening., Disp: , Rfl:    furosemide (LASIX) 20 MG tablet, Take 20 mg by mouth daily as needed for fluid., Disp: , Rfl:    hydrALAZINE (APRESOLINE) 50 MG tablet, Take 1 tablet (50 mg total) by mouth daily., Disp: 90 tablet, Rfl: 3   ipratropium-albuterol (DUONEB) 0.5-2.5 (3) MG/3ML SOLN, Take 3 mLs by nebulization every 4 (four) hours. (Patient taking differently: Take 3 mLs by nebulization every 4 (four) hours as needed (Shortness of breath).), Disp: 360 mL, Rfl: 0   levothyroxine (SYNTHROID) 150 MCG tablet, Take 150 mcg by mouth daily before breakfast., Disp: , Rfl:    metoprolol succinate (TOPROL-XL) 100 MG 24 hr tablet, Take 100 mg by mouth daily. , Disp: , Rfl:    montelukast (SINGULAIR) 10 MG tablet, Take 10 mg by mouth at bedtime., Disp: , Rfl:    Omega-3 Fatty Acids (OMEGA-3 PO), Take 2 capsules by mouth at bedtime., Disp: , Rfl:    oxyCODONE (ROXICODONE) 5 MG immediate release tablet, Take 1 tablet (5 mg total) by mouth every 4 (four) hours as needed for severe pain. (Patient taking differently: Take 10 mg by mouth 2 (two) times daily.), Disp: 45 tablet, Rfl: 0   senna (SENOKOT) 8.6 MG tablet, Take 1 tablet by mouth daily as needed for constipation., Disp: , Rfl:    Pseudoephedrine HCl (SUDAFED 12 HOUR PO), Take 2 tablets by mouth at bedtime as needed (congestion). (Patient not taking: Reported on 09/04/2023), Disp: , Rfl:    ASSESSMENT AND PLAN: .      ICD-10-CM   1. Chronic diastolic (congestive) heart failure (HCC)  I50.32 EKG  12-Lead    2. Primary hypertension  I10       Assessment and Plan    Chronic Systolic Heart Failure Ejection fraction normal, no heart failure symptoms. Unable to use ACE inhibitors or ARBs due to previous kidney failure and hypotension. Stable on current regimen of Metoprolol Succinate 100mg  and Hydralazine. -Continue current regimen. -Primary care physician to manage prescriptions.  Hypertension Well controlled on current regimen. -Continue current regimen.  Weight Management Successful weight loss and maintenance. -Continue current diet and exercise regimen.  Colonoscopy Scheduled for February. -Continue with scheduled colonoscopy.  Pulmonary Issues Under care of a pulmonologist. -Continue follow-up with pulmonologist.  Aortic Aneurysm Recent imaging shows no evidence of thoracic aortic aneurysm. -No further action required at this time.  Follow-up Graduated from cardiology, to follow-up with primary care physician.      Signed,  Yates Decamp, MD, [facility] 09/04/2023, 4:58 PM [facility] [address] [place], [place] [address] Phone: [phone]. Fax:  [phone]

## 2023-09-04 NOTE — Patient Instructions (Signed)
Medication Instructions:  Your physician recommends that you continue on your current medications as directed. Please refer to the Current Medication list given to you today. t *If you need a refill on your cardiac medications before your next appointment, please call your pharmacy*   Lab Work: none If you have labs (blood work) drawn today and your tests are completely normal, you will receive your results only by: MyChart Message (if you have MyChart) OR A paper copy in the mail If you have any lab test that is abnormal or we need to change your treatment, we will call you to review the results.   Testing/Procedures: none   Follow-Up: At [facility], you and your health needs are our priority.  As part of our continuing mission to provide you with exceptional heart care, we have created designated Provider Care Teams.  These Care Teams include your primary Cardiologist (physician) and Advanced Practice Providers (APPs -  Physician Assistants and Nurse Practitioners) who all work together to provide you with the care you need, when you need it.  We recommend signing up for the patient portal called "MyChart".  Sign up information is provided on this After Visit Summary.  MyChart is used to connect with patients for Virtual Visits (Telemedicine).  Patients are able to view lab/test results, encounter notes, upcoming appointments, etc.  Non-urgent messages can be sent to your provider as well.   To learn more about what you can do with MyChart, go to [email or web address].    Your next appointment:   As needed  Provider:   Yates Decamp, MD     Other Instructions

## 2023-09-10 DIAGNOSIS — N182 Chronic kidney disease, stage 2 (mild): Secondary | ICD-10-CM | POA: Diagnosis not present

## 2023-09-10 DIAGNOSIS — I420 Dilated cardiomyopathy: Secondary | ICD-10-CM | POA: Diagnosis not present

## 2023-09-10 DIAGNOSIS — I5032 Chronic diastolic (congestive) heart failure: Secondary | ICD-10-CM | POA: Diagnosis not present

## 2023-09-10 DIAGNOSIS — I77810 Thoracic aortic ectasia: Secondary | ICD-10-CM | POA: Diagnosis not present

## 2023-09-10 DIAGNOSIS — E785 Hyperlipidemia, unspecified: Secondary | ICD-10-CM | POA: Diagnosis not present

## 2023-09-10 DIAGNOSIS — I5022 Chronic systolic (congestive) heart failure: Secondary | ICD-10-CM | POA: Diagnosis not present

## 2023-09-10 DIAGNOSIS — R739 Hyperglycemia, unspecified: Secondary | ICD-10-CM | POA: Diagnosis not present

## 2023-09-10 DIAGNOSIS — E039 Hypothyroidism, unspecified: Secondary | ICD-10-CM | POA: Diagnosis not present

## 2023-09-10 DIAGNOSIS — I130 Hypertensive heart and chronic kidney disease with heart failure and stage 1 through stage 4 chronic kidney disease, or unspecified chronic kidney disease: Secondary | ICD-10-CM | POA: Diagnosis not present

## 2023-09-27 DIAGNOSIS — R339 Retention of urine, unspecified: Secondary | ICD-10-CM | POA: Diagnosis not present

## 2023-09-27 DIAGNOSIS — N400 Enlarged prostate without lower urinary tract symptoms: Secondary | ICD-10-CM | POA: Diagnosis not present

## 2023-10-31 ENCOUNTER — Encounter: Payer: Self-pay | Admitting: Physician Assistant

## 2023-11-02 ENCOUNTER — Ambulatory Visit: Payer: Medicare HMO | Admitting: Physician Assistant

## 2023-12-03 DIAGNOSIS — N400 Enlarged prostate without lower urinary tract symptoms: Secondary | ICD-10-CM | POA: Diagnosis not present

## 2023-12-03 DIAGNOSIS — R339 Retention of urine, unspecified: Secondary | ICD-10-CM | POA: Diagnosis not present

## 2023-12-13 ENCOUNTER — Encounter: Payer: Self-pay | Admitting: Physician Assistant

## 2023-12-13 ENCOUNTER — Ambulatory Visit: Payer: Medicare HMO | Admitting: Physician Assistant

## 2023-12-13 ENCOUNTER — Ambulatory Visit (INDEPENDENT_AMBULATORY_CARE_PROVIDER_SITE_OTHER)
Admission: RE | Admit: 2023-12-13 | Discharge: 2023-12-13 | Disposition: A | Discharge status: Home / Self Care | Source: Ambulatory Visit | Attending: Physician Assistant | Admitting: Physician Assistant

## 2023-12-13 VITALS — BP 116/72 | HR 82 | Ht 74.0 in | Wt 182.0 lb

## 2023-12-13 DIAGNOSIS — K5903 Drug induced constipation: Secondary | ICD-10-CM | POA: Diagnosis not present

## 2023-12-13 DIAGNOSIS — Z860101 Personal history of adenomatous and serrated colon polyps: Secondary | ICD-10-CM

## 2023-12-13 DIAGNOSIS — T402X5A Adverse effect of other opioids, initial encounter: Secondary | ICD-10-CM

## 2023-12-13 DIAGNOSIS — K5904 Chronic idiopathic constipation: Secondary | ICD-10-CM

## 2023-12-13 DIAGNOSIS — I5032 Chronic diastolic (congestive) heart failure: Secondary | ICD-10-CM | POA: Diagnosis not present

## 2023-12-13 DIAGNOSIS — G4733 Obstructive sleep apnea (adult) (pediatric): Secondary | ICD-10-CM | POA: Diagnosis not present

## 2023-12-13 DIAGNOSIS — I150 Renovascular hypertension: Secondary | ICD-10-CM

## 2023-12-13 DIAGNOSIS — Z981 Arthrodesis status: Secondary | ICD-10-CM | POA: Diagnosis not present

## 2023-12-13 MED ORDER — SUFLAVE 178.7 G PO SOLR: 1.0000 | Freq: Once | ORAL | 0 refills | Status: AC

## 2023-12-13 NOTE — Patient Instructions (Addendum)
 Your provider has requested that you have an abdominal x ray before leaving today. Please go to the basement floor to our Radiology department for the test.  Miralax is an osmotic laxative.  It only brings more water into the stool.  This is safe to take daily.  Can take up to 17 gram of miralax twice a day.  Mix with juice or coffee.  Start 1 capful at night for 3-4 days and reassess your response in 3-4 days.  You can increase and decrease the dose based on your response.  Remember, it can take up to 3-4 days to take effect OR for the effects to wear off.   I often pair this with benefiber in the morning to help assure the stool is not too loose.   Recommend starting on a fiber supplement, can try metamucil first but if this causes gas/bloating switch to benefiber or citracel, these do not cause gas.  Take with fiber with with a full 8 oz glass of water once a day. This can take 1 month to start helping, so try for at least one month.  Recommend increasing water and physical activity.   - Drink at least 64-80 ounces of water/liquid per day. - Establish a time to try to move your bowels every day.  For many people, this is after a cup of coffee or after a meal such as breakfast. - Sit all of the way back on the toilet keeping your back fairly straight and while sitting up, try to rest the tops of your forearms on your upper thighs.   - Raising your feet with a step stool/squatty potty can be helpful to improve the angle that allows your stool to pass through the rectum. - Relax the rectum feeling it bulge toward the toilet water.  If you feel your rectum raising toward your body, you are contracting rather than relaxing. - Breathe in and slowly exhale. "Belly breath" by expanding your belly towards your belly button. Keep belly expanded as you gently direct pressure down and back to the anus.  A low pitched GRRR sound can assist with increasing intra-abdominal pressure.  (Can also trying to  blow on a pinwheel and make it move, this helps with the same belly breathing) - Repeat 3-4 times. If unsuccessful, contract the pelvic floor to restore normal tone and get off the toilet.  Avoid excessive straining. - To reduce excessive wiping by teaching your anus to normally contract, place hands on outer aspect of knees and resist knee movement outward.  Hold 5-10 second then place hands just inside of knees and resist inward movement of knees.  Hold 5 seconds.  Repeat a few times each way.  Go to the ER if unable to pass gas, severe AB pain, unable to hold down food, any shortness of breath of chest pain.  You have been scheduled for a colonoscopy. Please follow written instructions given to you at your visit today.   If you use inhalers (even only as needed), please bring them with you on the day of your procedure.  DO NOT TAKE 7 DAYS PRIOR TO TEST- Trulicity (dulaglutide) Ozempic, Wegovy (semaglutide) Mounjaro (tirzepatide) Bydureon Bcise (exanatide extended release)  DO NOT TAKE 1 DAY PRIOR TO YOUR TEST Rybelsus (semaglutide) Adlyxin (lixisenatide) Victoza (liraglutide) Byetta (exanatide) ___________________________________________________________________________  Bonita Quin will receive your bowel preparation through [facility], which ensures the lowest copay and home delivery, with outreach via text or call from an [phone] number. Please respond promptly to  avoid rescheduling of your procedure. If you are interested in alternative options or have any questions regarding your prep, please contact them at [phone] ____________________________________________________________________________  Your Provider Has Sent Your Bowel Prep Regimen To [facility]   [facility] will contact you to verify your information and collect your copay, if applicable. Enjoy the comfort of your home while your prescription is mailed to you, FREE of any shipping charges.   [facility] accepts all major  insurance benefits and applies discounts & coupons.  Have additional questions?   Chat: [email or web address] Call: [phone] Email: [email or web address] NCPDP: [id number]  How will [facility] contact you?  With a Welcome phone call,  a Welcome text and a checkout link in text form.  Texts you receive from [phone] Are NOT Spam.  *To set up delivery, you must complete the checkout process via link or speak to one of the patient care representatives. If [facility] is unable to reach you, your prescription may be delayed.  To avoid long hold times on the phone, you may also utilize the secure chat feature on the [facility] website to request that they call you back for transaction completion or to expedite your concerns.   Thank you for trusting me with your gastrointestinal care!   Quentin Mulling, PA-C   Pelvic Floor Dysfunction, Male     Pelvic floor dysfunction (PFD) is a condition that results when the group of muscles and connective tissues that support the organs in the pelvis (pelvic floor muscles) do not work well. These muscles and their connections form a sling that supports the colon and bladder. In men, these muscles also support the prostate gland. PFD causes pelvic floor muscles to be too weak, too tight, or both. In PFD, muscle movements are not coordinated. This may cause bowel or bladder problems. It may also cause pain. What are the causes? This condition may be caused by an injury to the pelvic area or by a weakening of pelvic muscles. In many cases, the exact cause is not known. What increases the risk? The following factors may make you more likely to develop PFD: Having chronic bladder tissue inflammation (interstitial cystitis). Being an older person. Being overweight. History of radiation treatment for cancer in the pelvic region. Previous pelvic surgery, such as removal of the prostate gland (prostatectomy). What are the signs or  symptoms? Symptoms of this condition vary and may include: Bladder symptoms, such as: Trouble starting urination and emptying the bladder. Frequent urinary tract infections. Leaking urine when coughing, laughing, or exercising (stress incontinence). Having to pass urine urgently or frequently. Pain when passing urine. Bowel symptoms, such as: Constipation. Urgent or frequent bowel movements. Incomplete bowel movements. Painful bowel movements. Leaking stool or gas. Unexplained genital or rectal pain. Genital or rectal muscle spasms. Low back pain. Sexual dysfunction, such as erectile dysfunction, premature ejaculation, or pain during or after sexual activity. How is this diagnosed? This condition is diagnosed based on: Your symptoms and medical history. A physical exam. During the exam, your health care provider may check your pelvic muscles for tightness, spasm, pain, or weakness. This may include a rectal exam. In some cases, you may have diagnostic tests, such as: Electrical muscle function tests. Urine flow testing. X-ray tests of bowel function. Ultrasound of the pelvic organs. How is this treated? Treatment for this condition depends on your symptoms. Treatment options include: Physical therapy. This may include Kegel exercises to help relax or strengthen the pelvic floor muscles. Biofeedback. This type of therapy provides feedback  on how tight your pelvic floor muscles are so that you can learn to control them. Massage therapy. A treatment that involves electrical stimulation of the pelvic floor muscles to help control pain (transcutaneous electrical nerve stimulation, or TENS). Sound wave therapy (ultrasound) to reduce muscle spasms. Medicines, such as: Muscle relaxants. Bladder control medicines. Surgery to reconstruct or support pelvic floor muscles may be an option if other treatments do not help. Follow these instructions at home: Activity Do your usual activities  as told by your health care provider. Ask your health care provider if you should modify any activities. Do pelvic floor strengthening or relaxing exercises at home as told by your physical therapist. Lifestyle Maintain a healthy weight. Eat foods that are high in fiber, such as beans, whole grains, and fresh fruits and vegetables. Limit foods that are high in fat and processed sugars, such as fried or sweet foods. Manage stress with relaxation techniques such as yoga or meditation. General instructions If you have problems with leakage: Use absorbable pads or wear padded underwear. Wash your genital and anal area frequently with mild soap. Keep your genital and anal area as clean and dry as possible. Ask your health care provider if you should try a barrier cream to prevent skin irritation. Take warm baths to relieve pelvic muscle tension or spasms. Take over-the-counter and prescription medicines only as told by your health care provider. Keep all follow-up visits. How is this prevented? The cause of PFD is not always known, but there are a few things you can do to reduce the risk of developing this condition, including: Staying at a healthy weight. Getting regular exercise. Managing stress. Contact a health care provider if: Your symptoms are not improving with home care. You have signs or symptoms of PFD that get worse. You develop new signs or symptoms. You have signs of a urinary tract infection, such as: Fever. Chills. Increased urinary frequency. A burning feeling when urinating. You have not had a bowel movement in 3 days (constipation). Summary Pelvic floor dysfunction results when the muscles and connective tissues in your pelvic floor do not work well. These muscles and their connections form a sling that supports your colon and bladder. In men, these muscles also support the prostate gland. PFD may be caused by an injury to the pelvic area or by a weakening of pelvic  muscles. PFD causes pelvic floor muscles to be too weak, too tight, or a combination of both. Symptoms may vary from person to person. In most cases, PFD can be treated with physical therapies and medicines. Surgery may be an option if other treatments do not help. This information is not intended to replace advice given to you by your health care provider. Make sure you discuss any questions you have with your health care provider. Document Revised: 01/19/2021 Document Reviewed: 01/19/2021 Elsevier Patient Education  2024 [employer].

## 2023-12-13 NOTE — Progress Notes (Signed)
 12/13/2023 Peter [name] [id number] [birth year]  Referring provider: Creola Corn, MD Primary GI doctor: Dr. Chales Abrahams (Dr. Christella Hartigan)  ASSESSMENT AND PLAN:   Constipation secondary to opoid use, no rectal bleeding, has had some diarrhea due to magnesium use last 30 days, no AB pain History of diverticular bleed 2017 Trial of linzess, movantik without benefit Has to use colace daily and senokot once a week- having a BM twice a week He would like another colonoscopy Will schedule for colonoscopy with 2 day prep at [facility] We have discussed the risks of bleeding, infection, perforation, medication reactions, and remote risk of death associated with colonoscopy. All questions were answered and the patient acknowledges these risk and wishes to proceed. He would like to get a quote of the amount and possible pay off at once.  Will get xray, possible overflow diarrhea Consider amitiza/miralax/pelvic floor PT  History of TA polyp 05/2014 colonoscopy flat TA polyp 3 mm transverse colon moderate diverticulosis descending transverse sigmoid otherwise normal Recall 5 years  History of renovascular hypertension  status post right renal stent 2019  HFpEF follows with Dr. [name] Previous EF 25% improved with medical therapy 2022 ejection fraction 55% grade 1 diastolic dysfunction moderate MR mild to moderate TR mild pulmonic regurgitation mildly dilated aortic root  OSA/asthma On symbicort, he is not on O2 His last sleep study was 2019 and had AHI 24 Consider repeat sleep study  History of urinary retention Status post TURP 06/2023 with Dr. Mena Goes   Patient Care Team: Creola Corn, MD as PCP - General (Internal Medicine) Yates Decamp, MD as PCP - Cardiology (Cardiology) Lurena Nida, MD as Consulting Physician (Rheumatology)  HISTORY OF PRESENT ILLNESS: Discussed the use of AI scribe software for clinical note transcription with the patient, who gave verbal consent to  proceed.  History of Present Illness   Peter [name] is a 73 year old male who presents for a follow-up regarding his gastrointestinal and respiratory health.  He has a history of gastrointestinal bleeding, with a colonoscopy performed in September 2015 and a hospitalization in 2017 for a GI bleed. Currently, there is no rectal bleeding or significant changes in bowel habits, aside from constipation related to opioid use. He manages constipation with Colace and Senokot, taking a large dose of Senokot once a week, resulting in bowel movements twice a week. He has tried movantic and Linzess without success. Recently, he has experienced looser stools, which he attributes to magnesium supplementation for muscle spasms but still only 2 x a week.  He has a history of sleep apnea, initially managed with CPAP, which he discontinued due to throat irritation and sleep disruption. A sleep study in 2019 showed an apnea-hypopnea index (AHI) of 24, with 115 hypopneas. No snoring and he reports sleeping well most of the time.  He currently uses a nebulizer and Symbicort for asthma management. He reports stable breathing , no chest pain.  He  reports that he quit smoking about 28 years ago. His smoking use included cigarettes. He started smoking about 28 years ago. He has never used smokeless tobacco. He reports that he does not drink alcohol and does not use drugs.  RELEVANT GI HISTORY, IMAGING AND LABS: Results   DIAGNOSTIC Colonoscopy: Performed by Dr. Arlyce Dice (05/2014) Sleep study: AHI 24, 115 hypopneas, complex sleep apnea, potential central sleep apnea (2019)      CBC    Component Value Date/Time   WBC 2.7 (L) 10/19/2022 1406   RBC 4.14 (L) 10/19/2022 1406  HGB 11.6 (L) 07/03/2023 1121   HCT 34.0 (L) 07/03/2023 1121   PLT 171 10/19/2022 1406   MCV 89.1 10/19/2022 1406   MCH 28.5 10/19/2022 1406   MCHC 32.0 10/19/2022 1406   RDW 13.4 10/19/2022 1406   LYMPHSABS 1.1 08/01/2021 2050   MONOABS  0.4 08/01/2021 2050   EOSABS 0.0 08/01/2021 2050   BASOSABS 0.0 08/01/2021 2050   Recent Labs    07/03/23 1121  HGB 11.6*    CMP     Component Value Date/Time   NA 138 07/03/2023 1121   NA 141 10/24/2021 1539   K 3.8 07/03/2023 1121   CL 103 07/03/2023 1121   CO2 25 10/19/2022 1406   GLUCOSE 89 07/03/2023 1121   BUN 16 07/03/2023 1121   BUN 15 10/24/2021 1539   CREATININE 1.10 07/03/2023 1121   CALCIUM 9.1 10/19/2022 1406   PROT 6.6 08/04/2021 0513   PROT 7.1 06/03/2013 1039   ALBUMIN 3.6 08/04/2021 0513   AST 23 08/04/2021 0513   ALT 16 08/04/2021 0513   ALKPHOS 65 08/04/2021 0513   BILITOT 0.9 08/04/2021 0513   GFRNONAA >60 10/19/2022 1406   GFRAA >60 06/03/2020 1410      Latest Ref Rng & Units 08/04/2021    5:13 AM 08/03/2021    5:11 AM 04/11/2016    4:09 AM  Hepatic Function  Total Protein 6.5 - 8.1 g/dL 6.6  6.7  6.0   Albumin 3.5 - 5.0 g/dL 3.6  3.7  3.4   AST 15 - 41 U/L 23  23  16    ALT 0 - 44 U/L 16  15  14    Alk Phosphatase 38 - 126 U/L 65  70  67   Total Bilirubin 0.3 - 1.2 mg/dL 0.9  0.7  0.7       Current Medications:   Current Outpatient Medications (Endocrine & Metabolic):    levothyroxine (SYNTHROID) 150 MCG tablet, Take 150 mcg by mouth daily before breakfast.  Current Outpatient Medications (Cardiovascular):    furosemide (LASIX) 20 MG tablet, Take 20 mg by mouth daily as needed for fluid.   hydrALAZINE (APRESOLINE) 50 MG tablet, Take 1 tablet (50 mg total) by mouth daily.   metoprolol succinate (TOPROL-XL) 100 MG 24 hr tablet, Take 100 mg by mouth daily.   Current Outpatient Medications (Respiratory):    albuterol (VENTOLIN HFA) 108 (90 Base) MCG/ACT inhaler, Inhale 2 puffs into the lungs every 8 (eight) hours as needed for shortness of breath or wheezing.   budesonide-formoterol (SYMBICORT) 160-4.5 MCG/ACT inhaler, Inhale 2 puffs into the lungs in the morning and at bedtime.   cetirizine (ZYRTEC) 10 MG tablet, Take 10 mg by mouth daily as  needed for allergies or rhinitis.   ipratropium-albuterol (DUONEB) 0.5-2.5 (3) MG/3ML SOLN, Take 3 mLs by nebulization every 4 (four) hours. (Patient taking differently: Take 3 mLs by nebulization every 4 (four) hours as needed (Shortness of breath).)   montelukast (SINGULAIR) 10 MG tablet, Take 10 mg by mouth at bedtime.   Pseudoephedrine HCl (SUDAFED 12 HOUR PO), Take 2 tablets by mouth at bedtime as needed (congestion).  Current Outpatient Medications (Analgesics):    acetaminophen (TYLENOL) 500 MG tablet, Take 1,000 mg by mouth 2 (two) times daily as needed for moderate pain.   oxyCODONE (ROXICODONE) 5 MG immediate release tablet, Take 1 tablet (5 mg total) by mouth every 4 (four) hours as needed for severe pain. (Patient taking differently: Take 10 mg by mouth 2 (two) times  daily.)   Current Outpatient Medications (Other):    docusate sodium (COLACE) 100 MG capsule, Take 500 mg by mouth daily as needed for mild constipation.   finasteride (PROSCAR) 5 MG tablet, Take 5 mg by mouth every evening.   MAGNESIUM OXIDE PO*, Take by mouth.   Omega-3 Fatty Acids (OMEGA-3 PO)*, Take 2 capsules by mouth at bedtime.   PEG 3350-KCl-NaCl-NaSulf-MgSul (SUFLAVE) 178.7 g SOLR, Take 1 kit by mouth once for 1 dose.   senna (SENOKOT) 8.6 MG tablet, Take 1 tablet by mouth daily as needed for constipation. * These medications belong to multiple therapeutic classes and are listed under each applicable group.  Medical History:  Past Medical History:  Diagnosis Date   Abnormal CK 03/11/2013   Acute blood loss anemia 04/11/2016   Acute combined systolic and diastolic heart failure (HCC) 03/11/2013   Acute renal insufficiency 08/13/2013   Allergy    Arthritis    right knee   Asthma    Back pain    BPH (benign prostatic hyperplasia)    Colon polyps 2003, 2015   2003: hyperplastic. 05/2014: tubular adenoma   Complication of anesthesia    problems urinating after anesthesia   Diverticulosis of colon 2003,  2015   Dyspnea    with heavy exertion   Hypertension    Hypothyroidism    had Graves' disease - thyroid ablation   MRSA (methicillin resistant Staphylococcus aureus) infection    lumbar-2015   Obstructive sleep apnea syndrome 09/22/2014   Opiate use 10/02/2017   S/P radioactive iodine thyroid ablation 06/04/2017   Self-catheterizes urinary bladder    bid   Substance abuse (HCC)    not in many years   Viral cardiomyopathy (HCC) 2014   per Cath by Dr. [name] in 2014   Wears glasses    Allergies:  Allergies  Allergen Reactions   Benicar [Olmesartan] Other (See Comments)    Acute renal failure   Keflex [Cephalexin] Anaphylaxis   Amlodipine Swelling   Aspirin Other (See Comments)    Exacerbates asthma attacks.    Tamsulosin Other (See Comments)    "Felt bad, made Pt irritable"      Surgical History:  He  has a past surgical history that includes Cardiac catheterization (2014); Anterior cervical decomp/discectomy fusion (N/A, 07/30/2013); Lumbar wound debridement (N/A, 11/07/2013); Tonsillectomy (1958); Back surgery (1/15); Colonoscopy (2003, 2015); Carpal tunnel release (Right, 03/23/2014); left and right heart catheterization with coronary angiogram (N/A, 03/11/2013); Anterior cervical decomp/discectomy fusion (N/A, 04/16/2015); and Transurethral resection of prostate (N/A, 07/03/2023). Family History:  His family history includes Diabetes in his mother; Hypertension in his mother; Pancreatic cancer in his mother.  REVIEW OF SYSTEMS  : All other systems reviewed and negative except where noted in the History of Present Illness.  PHYSICAL EXAM: BP 116/72   Pulse 82   Ht 6\' 2"  (1.88 m)   Wt 182 lb (82.6 kg)   BMI 23.37 kg/m  Physical Exam   MEASUREMENTS: Height- 6'2", Weight- 180. GENERAL APPEARANCE: Well nourished, in no apparent distress. HEENT: No cervical lymphadenopathy, unremarkable thyroid, sclerae anicteric, conjunctiva pink. RESPIRATORY: Respiratory effort normal, breath  sounds equal bilaterally without rales, rhonchi, or wheezing. CARDIO: Regular rate and rhythm with no murmurs, rubs, or gallops, peripheral pulses intact. ABDOMEN: Soft, non-distended, active bowel sounds in all four quadrants, no tenderness to palpation, no rebound, no mass appreciated. Umbilical hernia without pain. RECTAL: Declines. MUSCULOSKELETAL: Full range of motion, normal gait, mild swelling in legs. SKIN: Dry, intact without rashes  or lesions. No jaundice. NEURO: Alert, oriented, no focal deficits. PSYCH: Cooperative, normal mood and affect.      Doree Albee, PA-C 4:01 PM

## 2023-12-14 DIAGNOSIS — R3914 Feeling of incomplete bladder emptying: Secondary | ICD-10-CM | POA: Diagnosis not present

## 2023-12-14 DIAGNOSIS — N401 Enlarged prostate with lower urinary tract symptoms: Secondary | ICD-10-CM | POA: Diagnosis not present

## 2023-12-14 DIAGNOSIS — N318 Other neuromuscular dysfunction of bladder: Secondary | ICD-10-CM | POA: Diagnosis not present

## 2024-01-18 DIAGNOSIS — I130 Hypertensive heart and chronic kidney disease with heart failure and stage 1 through stage 4 chronic kidney disease, or unspecified chronic kidney disease: Secondary | ICD-10-CM | POA: Diagnosis not present

## 2024-01-18 DIAGNOSIS — F1120 Opioid dependence, uncomplicated: Secondary | ICD-10-CM | POA: Diagnosis not present

## 2024-01-18 DIAGNOSIS — I420 Dilated cardiomyopathy: Secondary | ICD-10-CM | POA: Diagnosis not present

## 2024-01-18 DIAGNOSIS — N319 Neuromuscular dysfunction of bladder, unspecified: Secondary | ICD-10-CM | POA: Diagnosis not present

## 2024-01-18 DIAGNOSIS — R627 Adult failure to thrive: Secondary | ICD-10-CM | POA: Diagnosis not present

## 2024-01-18 DIAGNOSIS — G894 Chronic pain syndrome: Secondary | ICD-10-CM | POA: Diagnosis not present

## 2024-01-18 DIAGNOSIS — N182 Chronic kidney disease, stage 2 (mild): Secondary | ICD-10-CM | POA: Diagnosis not present

## 2024-01-18 DIAGNOSIS — I5042 Chronic combined systolic (congestive) and diastolic (congestive) heart failure: Secondary | ICD-10-CM | POA: Diagnosis not present

## 2024-01-22 ENCOUNTER — Encounter: Payer: Self-pay | Admitting: Pediatrics

## 2024-01-24 ENCOUNTER — Telehealth: Payer: Self-pay | Admitting: Physician Assistant

## 2024-01-24 NOTE — Telephone Encounter (Signed)
 I called patient and left a message at [phone].  Patient can continue the MiraLAX  up until the day before his colonoscopy.  He does not need to repeat the x-ray as long as he is doing the 2-day prep and he is having bowel movements and passing gas. I also sent a MyChart message with the same information

## 2024-01-24 NOTE — Telephone Encounter (Signed)
 Patient called and stated that he has 2 question before his procedure. The first question is because he has had a blockage that showed up in his x-ray, patient is wanting to know if he needs to have another x-ray before his procedure to determine if his blockage has cleared. The second question was is he suppose to continue taking miralax  up to the day of his procedure. Patient is requesting a call back.Please advise.

## 2024-01-25 DIAGNOSIS — N400 Enlarged prostate without lower urinary tract symptoms: Secondary | ICD-10-CM | POA: Diagnosis not present

## 2024-01-25 DIAGNOSIS — R339 Retention of urine, unspecified: Secondary | ICD-10-CM | POA: Diagnosis not present

## 2024-01-29 ENCOUNTER — Ambulatory Visit: Admitting: Gastroenterology

## 2024-01-29 ENCOUNTER — Encounter: Payer: Self-pay | Admitting: Gastroenterology

## 2024-01-29 VITALS — BP 118/97 | HR 64 | Temp 97.9°F | Resp 17 | Ht 74.0 in | Wt 182.0 lb

## 2024-01-29 DIAGNOSIS — D124 Benign neoplasm of descending colon: Secondary | ICD-10-CM | POA: Diagnosis not present

## 2024-01-29 DIAGNOSIS — Z860101 Personal history of adenomatous and serrated colon polyps: Secondary | ICD-10-CM | POA: Diagnosis not present

## 2024-01-29 DIAGNOSIS — K635 Polyp of colon: Secondary | ICD-10-CM

## 2024-01-29 DIAGNOSIS — K5730 Diverticulosis of large intestine without perforation or abscess without bleeding: Secondary | ICD-10-CM | POA: Diagnosis not present

## 2024-01-29 DIAGNOSIS — D123 Benign neoplasm of transverse colon: Secondary | ICD-10-CM

## 2024-01-29 DIAGNOSIS — F1190 Opioid use, unspecified, uncomplicated: Secondary | ICD-10-CM | POA: Diagnosis not present

## 2024-01-29 DIAGNOSIS — K5904 Chronic idiopathic constipation: Secondary | ICD-10-CM | POA: Diagnosis not present

## 2024-01-29 DIAGNOSIS — I10 Essential (primary) hypertension: Secondary | ICD-10-CM | POA: Diagnosis not present

## 2024-01-29 DIAGNOSIS — G4733 Obstructive sleep apnea (adult) (pediatric): Secondary | ICD-10-CM | POA: Diagnosis not present

## 2024-01-29 DIAGNOSIS — K640 First degree hemorrhoids: Secondary | ICD-10-CM | POA: Diagnosis not present

## 2024-01-29 DIAGNOSIS — E039 Hypothyroidism, unspecified: Secondary | ICD-10-CM | POA: Diagnosis not present

## 2024-01-29 DIAGNOSIS — Z1211 Encounter for screening for malignant neoplasm of colon: Secondary | ICD-10-CM

## 2024-01-29 MED ORDER — SODIUM CHLORIDE 0.9 % IV SOLN: 500.0000 mL | INTRAVENOUS | Status: DC

## 2024-01-29 NOTE — Patient Instructions (Signed)
   Handouts on polyps,diverticulosis,& hemorrhoids given to you today.   Await pathology results on polyps removed   Miralax  1 capful (17 gms) in 8 ozs of water  by mouth daily  Continue previous diet & medications   YOU HAD AN ENDOSCOPIC PROCEDURE TODAY AT THE [facility]:   Refer to the procedure report that was given to you for any specific questions about what was found during the examination.  If the procedure report does not answer your questions, please call your gastroenterologist to clarify.  If you requested that your care partner not be given the details of your procedure findings, then the procedure report has been included in a sealed envelope for you to review at your convenience later.  YOU SHOULD EXPECT: Some feelings of bloating in the abdomen. Passage of more gas than usual.  Walking can help get rid of the air that was put into your GI tract during the procedure and reduce the bloating. If you had a lower endoscopy (such as a colonoscopy or flexible sigmoidoscopy) you may notice spotting of blood in your stool or on the toilet paper. If you underwent a bowel prep for your procedure, you may not have a normal bowel movement for a few days.  Please Note:  You might notice some irritation and congestion in your nose or some drainage.  This is from the oxygen used during your procedure.  There is no need for concern and it should clear up in a day or so.  SYMPTOMS TO REPORT IMMEDIATELY:  Following lower endoscopy (colonoscopy or flexible sigmoidoscopy):  Excessive amounts of blood in the stool  Significant tenderness or worsening of abdominal pains  Swelling of the abdomen that is new, acute  Fever of 100F or higher   For urgent or emergent issues, a gastroenterologist can be reached at any hour by calling [phone]. Do not use MyChart messaging for urgent concerns.    DIET:  We do recommend a small meal at first, but then you may proceed to your regular  diet.  Drink plenty of fluids but you should avoid alcoholic beverages for 24 hours.  ACTIVITY:  You should plan to take it easy for the rest of today and you should NOT DRIVE or use heavy machinery until tomorrow (because of the sedation medicines used during the test).    FOLLOW UP: Our staff will call the number listed on your records the next business day following your procedure.  We will call around 7:15- 8:00 am to check on you and address any questions or concerns that you may have regarding the information given to you following your procedure. If we do not reach you, we will leave a message.     If any biopsies were taken you will be contacted by phone or by letter within the next 1-3 weeks.  Please call us  at [phone] if you have not heard about the biopsies in 3 weeks.    SIGNATURES/CONFIDENTIALITY: You and/or your care partner have signed paperwork which will be entered into your electronic medical record.  These signatures attest to the fact that that the information above on your After Visit Summary has been reviewed and is understood.  Full responsibility of the confidentiality of this discharge information lies with you and/or your care-partner.

## 2024-01-29 NOTE — Op Note (Signed)
 [facility] Patient Name: Peter [name] Procedure Date: 01/29/2024 9:13 AM MRN: [id number] Endoscopist: Lajuan Pila , MD, [id number] Age: 73 Referring MD:  Date of Birth: October 21, 1950 Gender: Male Account #: [id number] Procedure:                Colonoscopy Indications:              High risk colon cancer surveillance: Personal                            history of colonic polyps. Chronic constipation. Medicines:                Monitored Anesthesia Care Procedure:                Pre-Anesthesia Assessment:                           - Prior to the procedure, a History and Physical                            was performed, and patient medications and                            allergies were reviewed. The patient's tolerance of                            previous anesthesia was also reviewed. The risks                            and benefits of the procedure and the sedation                            options and risks were discussed with the patient.                            All questions were answered, and informed consent                            was obtained. Prior Anticoagulants: The patient has                            taken no anticoagulant or antiplatelet agents. ASA                            Grade Assessment: II - A patient with mild systemic                            disease. After reviewing the risks and benefits,                            the patient was deemed in satisfactory condition to                            undergo the procedure.  After obtaining informed consent, the colonoscope                            was passed under direct vision. Throughout the                            procedure, the patient's blood pressure, pulse, and                            oxygen saturations were monitored continuously. The                            CF [id number] #[id number] was introduced through the anus                            and advanced  to the the cecum, identified by                            appendiceal orifice and ileocecal valve. The                            colonoscopy was somewhat difficult due to a                            tortuous colon and quality of prep. Successful                            completion of the procedure was aided by applying                            abdominal pressure. The patient tolerated the                            procedure well. The quality of the bowel                            preparation was adequate to identify polyps. Some                            stool retained throughout the colon. Nevertheless,                            aggressive suctioning and aspiration was performed.                            Overall over 85 to 90% of the colonic mucosa was                            visualized satisfactorily. The ileocecal valve,                            appendiceal orifice, and rectum were photographed. Scope In: 9:27:49 AM Scope Out: 9:51:24 AM Scope Withdrawal Time: 0 hours 12 minutes 35 seconds  Total Procedure Duration: 0 hours 23 minutes 35 seconds  Findings:                 Three sessile polyps were found in the mid                            descending colon and proximal transverse colon. The                            polyps were 6 to 10 mm in size. These polyps were                            removed with a cold snare. Resection and retrieval                            were complete.                           Multiple medium-mouthed and small-mouthed                            diverticula were found in the entire colon. Most                            prominently in the sigmoid colon. There were                            several diverticula visualized in the cecum with                            stool impacted as well.                           Non-bleeding internal hemorrhoids were found during                            retroflexion. The hemorrhoids were small and  Grade                            I (internal hemorrhoids that do not prolapse).                           The exam was otherwise without abnormality on                            direct and retroflexion views. Complications:            No immediate complications. Estimated Blood Loss:     Estimated blood loss: none. Impression:               - Three 6 to 10 mm polyps in the mid descending                            colon and in the proximal transverse colon, removed  with a cold snare. Resected and retrieved.                           - Moderate to severe pancolonic diverticulosis.                           - Non-bleeding internal hemorrhoids.                           - The examination was otherwise normal on direct                            and retroflexion views. Recommendation:           - Patient has a contact number available for                            emergencies. The signs and symptoms of potential                            delayed complications were discussed with the                            patient. Return to normal activities tomorrow.                            Written discharge instructions were provided to the                            patient.                           - Resume previous diet.                           - Continue present medications.                           - Miralax  1 capful (17 grams) in 8 ounces of water                             PO daily.                           - Await pathology results. If colonoscopy is needed                            in future, would require a 2-day prep.                           - Repeat colonoscopy for surveillance based on                            pathology results.                           - The findings and  recommendations were discussed                            with the patient's family. Lajuan Pila, MD 01/29/2024 9:57:47 AM This report has been signed electronically.

## 2024-01-29 NOTE — Progress Notes (Unsigned)
 Called to room to assist during endoscopic procedure.  Patient ID and intended procedure confirmed with present staff. Received instructions for my participation in the procedure from the performing physician.

## 2024-01-29 NOTE — Progress Notes (Unsigned)
 Sedate, gd SR, tolerated procedure well, VSS, report to RN

## 2024-01-29 NOTE — Progress Notes (Unsigned)
 Pt's states no medical or surgical changes since previsit or office visit.

## 2024-01-30 ENCOUNTER — Telehealth: Payer: Self-pay

## 2024-01-30 NOTE — Progress Notes (Signed)
 [facility] History and Physical   Primary Care Physician:  Margarete Sharps, MD   Reason for Procedure:   CRC screening  Plan:    colon     HPI: Peter [name] is a 73 y.o. male    Past Medical History:  Diagnosis Date   Abnormal CK 03/11/2013   Acute blood loss anemia 04/11/2016   Acute combined systolic and diastolic heart failure (HCC) 03/11/2013   Acute renal insufficiency 08/13/2013   Allergy    Arthritis    right knee   Asthma    Back pain    BPH (benign prostatic hyperplasia)    Colon polyps 2003, 2015   2003: hyperplastic. 05/2014: tubular adenoma   Complication of anesthesia    problems urinating after anesthesia   Diverticulosis of colon 2003, 2015   Dyspnea    with heavy exertion   Hypertension    Hypothyroidism    had Graves' disease - thyroid  ablation   MRSA (methicillin resistant Staphylococcus aureus) infection    lumbar-2015   Obstructive sleep apnea syndrome 09/22/2014   Opiate use 10/02/2017   S/P radioactive iodine thyroid  ablation 06/04/2017   Self-catheterizes urinary bladder    bid   Substance abuse (HCC)    not in many years   Viral cardiomyopathy (HCC) 2014   per Cath by Dr. Berry Bristol in 2014   Wears glasses     Past Surgical History:  Procedure Laterality Date   ANTERIOR CERVICAL DECOMP/DISCECTOMY FUSION N/A 07/30/2013   Procedure: CERVICAL FIVE,CERVICAL SIX CORPECTOMY,ANTERIOR ARTHRODESIS,PEEK INTERBODY GRAFT CERVICAL FOUR-CERVICAL SEVEN,ANTERIOR INSTRUMENTATION;  Surgeon: Pasty Bongo, MD;  Location: [facility];  Service: Neurosurgery;  Laterality: N/A;   ANTERIOR CERVICAL DECOMP/DISCECTOMY FUSION N/A 04/16/2015   Procedure: CERVICAL THREE-FOUR ANTERIOR CERVICAL DECOMPRESSION/DISCECTOMY FUSION 1 LEVEL;  Surgeon: Audie Bleacher, MD;  Location: [facility];  Service: Neurosurgery;  Laterality: N/A;  C34 anterior cervical decompression with fusion plating and bonegraft   BACK SURGERY  1/15   lumb fusion   CARDIAC  CATHETERIZATION  2014   CARPAL TUNNEL RELEASE Right 03/23/2014   Procedure: RIGHT CARPAL TUNNEL RELEASE;  Surgeon: Milagros Alf, MD;  Location:  SURGERY CENTER;  Service: Orthopedics;  Laterality: Right;   COLONOSCOPY  2003, 2015   LEFT AND RIGHT HEART CATHETERIZATION WITH CORONARY ANGIOGRAM N/A 03/11/2013   Procedure: LEFT AND RIGHT HEART CATHETERIZATION WITH CORONARY ANGIOGRAM;  Surgeon: Jessica Morn, MD;  Location: [facility] C[facility] LAB;  Service: Cardiovascular;  Laterality: N/A;   LUMBAR WOUND DEBRIDEMENT N/A 11/07/2013   Procedure: LUMBAR WOUND DEBRIDEMENT;  Surgeon: Pasty Bongo, MD;  Location: [facility];  Service: Neurosurgery;  Laterality: N/A;   TONSILLECTOMY  1958   TRANSURETHRAL RESECTION OF PROSTATE N/A 07/03/2023   Procedure: TRANSURETHRAL RESECTION OF THE PROSTATE (TURP);  Surgeon: Christina Coyer, MD;  Location: [facility];  Service: Urology;  Laterality: N/A;    Prior to Admission medications   Medication Sig Start Date End Date Taking? Authorizing Provider  acetaminophen  (TYLENOL ) 500 MG tablet Take 1,000 mg by mouth 2 (two) times daily as needed for moderate pain.   Yes [provider]  albuterol  (VENTOLIN  HFA) 108 (90 Base) MCG/ACT inhaler Inhale 2 puffs into the lungs every 8 (eight) hours as needed for shortness of breath or wheezing. 10/04/22  Yes [provider]  budesonide -formoterol  (SYMBICORT ) 160-4.5 MCG/ACT inhaler Inhale 2 puffs into the lungs in the morning and at bedtime. 05/02/23  Yes Hunsucker, Archer Kobs, MD  furosemide  (LASIX )  20 MG tablet Take 20 mg by mouth daily as needed for fluid.   Yes [provider]  hydrALAZINE  (APRESOLINE ) 50 MG tablet Take 1 tablet (50 mg total) by mouth daily. 01/11/23  Yes Knox Perl, MD  ipratropium-albuterol  (DUONEB) 0.5-2.5 (3) MG/3ML SOLN Take 3 mLs by nebulization every 4 (four) hours. Patient taking differently: Take 3 mLs by nebulization every 4 (four) hours as needed  (Shortness of breath). 06/05/17  Yes Rizwan, Saima, MD  levothyroxine  (SYNTHROID ) 150 MCG tablet Take 150 mcg by mouth daily before breakfast.   Yes [provider]  metoprolol  succinate (TOPROL -XL) 100 MG 24 hr tablet Take 100 mg by mouth daily.  02/06/18  Yes [provider]  montelukast  (SINGULAIR ) 10 MG tablet Take 10 mg by mouth at bedtime. 05/07/17  Yes [provider]  Omega-3 Fatty Acids (OMEGA-3 PO) Take 2 capsules by mouth at bedtime.   Yes [provider]  oxyCODONE  (ROXICODONE ) 5 MG immediate release tablet Take 1 tablet (5 mg total) by mouth every 4 (four) hours as needed for severe pain. Patient taking differently: Take 10 mg by mouth 2 (two) times daily. 04/18/15  Yes Kritzer, Mont Antis, MD  Pseudoephedrine HCl (SUDAFED 12 HOUR PO) Take 2 tablets by mouth at bedtime as needed (congestion).   Yes [provider]  cetirizine (ZYRTEC) 10 MG tablet Take 10 mg by mouth daily as needed for allergies or rhinitis.    [provider]  docusate sodium  (COLACE) 100 MG capsule Take 500 mg by mouth daily as needed for mild constipation.    [provider]  finasteride  (PROSCAR ) 5 MG tablet Take 5 mg by mouth every evening. 05/09/22   [provider]  MAGNESIUM  OXIDE PO Take by mouth.    [provider]  senna (SENOKOT) 8.6 MG tablet Take 1 tablet by mouth daily as needed for constipation.    [provider]    Current Outpatient Medications  Medication Sig Dispense Refill   acetaminophen  (TYLENOL ) 500 MG tablet Take 1,000 mg by mouth 2 (two) times daily as needed for moderate pain.     albuterol  (VENTOLIN  HFA) 108 (90 Base) MCG/ACT inhaler Inhale 2 puffs into the lungs every 8 (eight) hours as needed for shortness of breath or wheezing.     budesonide -formoterol  (SYMBICORT ) 160-4.5 MCG/ACT inhaler Inhale 2 puffs into the lungs in the morning and at bedtime. 1 each 6   furosemide  (LASIX ) 20 MG tablet Take 20 mg by  mouth daily as needed for fluid.     hydrALAZINE  (APRESOLINE ) 50 MG tablet Take 1 tablet (50 mg total) by mouth daily. 90 tablet 3   ipratropium-albuterol  (DUONEB) 0.5-2.5 (3) MG/3ML SOLN Take 3 mLs by nebulization every 4 (four) hours. (Patient taking differently: Take 3 mLs by nebulization every 4 (four) hours as needed (Shortness of breath).) 360 mL 0   levothyroxine  (SYNTHROID ) 150 MCG tablet Take 150 mcg by mouth daily before breakfast.     metoprolol  succinate (TOPROL -XL) 100 MG 24 hr tablet Take 100 mg by mouth daily.      montelukast  (SINGULAIR ) 10 MG tablet Take 10 mg by mouth at bedtime.     Omega-3 Fatty Acids (OMEGA-3 PO) Take 2 capsules by mouth at bedtime.     oxyCODONE  (ROXICODONE ) 5 MG immediate release tablet Take 1 tablet (5 mg total) by mouth every 4 (four) hours as needed for severe pain. (Patient taking differently: Take 10 mg by mouth 2 (two) times daily.) 45 tablet 0  Pseudoephedrine HCl (SUDAFED 12 HOUR PO) Take 2 tablets by mouth at bedtime as needed (congestion).     cetirizine (ZYRTEC) 10 MG tablet Take 10 mg by mouth daily as needed for allergies or rhinitis.     docusate sodium  (COLACE) 100 MG capsule Take 500 mg by mouth daily as needed for mild constipation.     finasteride  (PROSCAR ) 5 MG tablet Take 5 mg by mouth every evening.     MAGNESIUM  OXIDE PO Take by mouth.     senna (SENOKOT) 8.6 MG tablet Take 1 tablet by mouth daily as needed for constipation.     No current facility-administered medications for this visit.    Allergies as of 01/29/2024 - Review Complete 01/29/2024  Allergen Reaction Noted   Benicar [olmesartan] Other (See Comments) 09/28/2021   Keflex [cephalexin] Anaphylaxis 03/10/2013   Amlodipine Swelling 09/28/2021   Aspirin  Other (See Comments) 04/03/2013   Tamsulosin  Other (See Comments) 09/06/2022    Family History  Problem Relation Age of Onset   Diabetes Mother    Hypertension Mother    Pancreatic cancer Mother    Colon cancer Neg  Hx     Social History   Socioeconomic History   Marital status: Married    Spouse name: Alvy Baar   Number of children: 4   Years of education: [employer] education level: Not on file  Occupational History    Employer: [employer]   Tobacco Use   Smoking status: Former    Current packs/day: 0.00    Types: Cigarettes    Start date: 08/14/1995    Quit date: 08/14/1995    Years since quitting: 28.4   Smokeless tobacco: Never  Vaping Use   Vaping status: Never Used  Substance and Sexual Activity   Alcohol use: No    Alcohol/week: 0.0 standard drinks of alcohol   Drug use: No   Sexual activity: Yes  Other Topics Concern   Not on file  Social History Narrative   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   Patient has four adult children.   Patient is disabled.   Patient has college education.   Patient is right-handed.   Patient drinks very little caffeine.   Social Drivers of Corporate investment banker Strain: Not on file  Food Insecurity: No Food Insecurity (11/03/2022)   Hunger Vital Sign    Worried About Running Out of Food in the Last Year: Never true    Ran Out of Food in the Last Year: Never true  Transportation Needs: No Transportation Needs (11/03/2022)   PRAPARE - Administrator, Civil Service (Medical): No    Lack of Transportation (Non-Medical): No  Physical Activity: Not on file  Stress: Not on file  Social Connections: Not on file  Intimate Partner Violence: Not At Risk (11/03/2022)   Humiliation, Afraid, Rape, and Kick questionnaire    Fear of Current or Ex-Partner: No    Emotionally Abused: No    Physically Abused: No    Sexually Abused: No    Review of Systems: Positive for none All other review of systems negative except as mentioned in the HPI.  Physical Exam: Vital signs in last 24 hours: @VSRANGES @   General:   Alert,  Well-developed, well-nourished, pleasant and cooperative in NAD Lungs:  Clear  throughout to auscultation.   Heart:  Regular rate and rhythm; no murmurs, clicks, rubs,  or gallops. Abdomen:  Soft, nontender and nondistended. Normal bowel  sounds.   Neuro/Psych:  Alert and cooperative. Normal mood and affect. A and O x 3    No significant changes were identified.  The patient continues to be an appropriate candidate for the planned procedure and anesthesia.   Magnus Schuller, MD. [facility] 01/30/2024 10:50 AM@

## 2024-01-30 NOTE — Telephone Encounter (Signed)
 Left message on answering machine.

## 2024-01-31 LAB — SURGICAL PATHOLOGY

## 2024-02-11 ENCOUNTER — Ambulatory Visit: Payer: Self-pay | Admitting: Gastroenterology

## 2024-02-12 ENCOUNTER — Ambulatory Visit: Admitting: Podiatry

## 2024-02-12 ENCOUNTER — Encounter: Payer: Self-pay | Admitting: Podiatry

## 2024-02-12 DIAGNOSIS — M79674 Pain in right toe(s): Secondary | ICD-10-CM | POA: Diagnosis not present

## 2024-02-12 DIAGNOSIS — B351 Tinea unguium: Secondary | ICD-10-CM

## 2024-02-12 DIAGNOSIS — R234 Changes in skin texture: Secondary | ICD-10-CM

## 2024-02-12 DIAGNOSIS — M79675 Pain in left toe(s): Secondary | ICD-10-CM

## 2024-02-12 NOTE — Patient Instructions (Signed)
   For dry or cracked skin, recommend daily use of Bag Balm Hand and Body Moistuizer which may be purchased at local drug store or on [employer].

## 2024-02-18 NOTE — Progress Notes (Signed)
  Subjective:  Patient ID: Peter [name], male    DOB: 1951-06-12,  MRN: [id number]  73 y.o. male presents painful, elongated thickened toenails x 10 which are symptomatic when wearing enclosed shoe gear. This interferes with his/her daily activities. Patient states his feet are tingling. He does have documented back problems and has not seen his spine He also states his right heel is cracked. Chief Complaint  Patient presents with   RFC    Diabetes: no Last A1c: n/a Last FSBS: n/a Anticoagulant: none PCP Name & Last Visit: Dr. Mamie Searles 09/10/2023    PCP is Margarete Sharps, MD.  Allergies  Allergen Reactions   Benicar [Olmesartan] Other (See Comments)    Acute renal failure   Keflex [Cephalexin] Anaphylaxis   Amlodipine Swelling   Aspirin  Other (See Comments)    Exacerbates asthma attacks.    Tamsulosin  Other (See Comments)    "Felt bad, made Pt irritable"     Review of Systems: Negative except as noted in the HPI.   Objective:  Peter [name] is a pleasant 73 y.o. male WD, WN in NAD. AAO x 3.  Vascular Examination: Vascular status intact b/l with palpable pedal pulses. CFT immediate b/l. Pedal hair present. No edema. No pain with calf compression b/l. Skin temperature gradient WNL b/l. No varicosities noted. No cyanosis or clubbing noted.  Neurological Examination: Sensation grossly intact b/l with 10 gram monofilament. Vibratory sensation intact b/l.  Dermatological Examination: Pedal skin dry and cracked right heel. No open wounds nor interdigital macerations noted. Toenails 1-5 b/l thick, discolored, elongated with subungual debris and pain on dorsal palpation. No hyperkeratotic lesions noted b/l.   Musculoskeletal Examination: Muscle strength 5/5 to b/l LE.  No pain, crepitus noted b/l. No gross pedal deformities. Patient ambulates independently without assistive aids.   Radiographs: None  Last A1c:       No data to display           Assessment:   1. Pain  due to onychomycosis of toenails of both feet   2. Cracked skin on feet     Plan:  Patient was evaluated and treated. All patient's and/or POA's questions/concerns addressed on today's visit. Mycotic toenails 1-5 debrided in length and girth without incident. Continue soft, supportive shoe gear daily.  -Discussed tingling in feet. He does have documented back problems and I advised him to see his spine MD for re-evalulation. -Patient to continue soft, supportive shoe gear daily. -For dry skin, recommended daily use of Bag Balm Hand and Body Moistuizer which may be purchased at local drug store or on [employer]. -Patient/POA to call should there be question/concern in the interim.  Return in about 3 months (around 05/14/2024).  Peter [name], DPM      [facility] LOCATION: [address], [place] [address]                   Office [phone]   [facility] LOCATION: [address] [place], [place] [address] Office [phone]

## 2024-03-27 DIAGNOSIS — N400 Enlarged prostate without lower urinary tract symptoms: Secondary | ICD-10-CM | POA: Diagnosis not present

## 2024-03-27 DIAGNOSIS — R339 Retention of urine, unspecified: Secondary | ICD-10-CM | POA: Diagnosis not present

## 2024-04-21 DIAGNOSIS — N401 Enlarged prostate with lower urinary tract symptoms: Secondary | ICD-10-CM | POA: Diagnosis not present

## 2024-04-21 DIAGNOSIS — R3914 Feeling of incomplete bladder emptying: Secondary | ICD-10-CM | POA: Diagnosis not present

## 2024-04-24 DIAGNOSIS — I5042 Chronic combined systolic (congestive) and diastolic (congestive) heart failure: Secondary | ICD-10-CM | POA: Diagnosis not present

## 2024-04-24 DIAGNOSIS — E039 Hypothyroidism, unspecified: Secondary | ICD-10-CM | POA: Diagnosis not present

## 2024-04-24 DIAGNOSIS — E785 Hyperlipidemia, unspecified: Secondary | ICD-10-CM | POA: Diagnosis not present

## 2024-04-24 DIAGNOSIS — N401 Enlarged prostate with lower urinary tract symptoms: Secondary | ICD-10-CM | POA: Diagnosis not present

## 2024-04-24 DIAGNOSIS — Z1212 Encounter for screening for malignant neoplasm of rectum: Secondary | ICD-10-CM | POA: Diagnosis not present

## 2024-04-24 DIAGNOSIS — E538 Deficiency of other specified B group vitamins: Secondary | ICD-10-CM | POA: Diagnosis not present

## 2024-04-24 DIAGNOSIS — I130 Hypertensive heart and chronic kidney disease with heart failure and stage 1 through stage 4 chronic kidney disease, or unspecified chronic kidney disease: Secondary | ICD-10-CM | POA: Diagnosis not present

## 2024-04-24 DIAGNOSIS — D649 Anemia, unspecified: Secondary | ICD-10-CM | POA: Diagnosis not present

## 2024-04-24 DIAGNOSIS — R972 Elevated prostate specific antigen [PSA]: Secondary | ICD-10-CM | POA: Diagnosis not present

## 2024-04-24 DIAGNOSIS — N182 Chronic kidney disease, stage 2 (mild): Secondary | ICD-10-CM | POA: Diagnosis not present

## 2024-05-01 DIAGNOSIS — I5032 Chronic diastolic (congestive) heart failure: Secondary | ICD-10-CM | POA: Diagnosis not present

## 2024-05-01 DIAGNOSIS — F1120 Opioid dependence, uncomplicated: Secondary | ICD-10-CM | POA: Diagnosis not present

## 2024-05-01 DIAGNOSIS — G894 Chronic pain syndrome: Secondary | ICD-10-CM | POA: Diagnosis not present

## 2024-05-01 DIAGNOSIS — Z0000 Encounter for general adult medical examination without abnormal findings: Secondary | ICD-10-CM | POA: Diagnosis not present

## 2024-05-01 DIAGNOSIS — Z1331 Encounter for screening for depression: Secondary | ICD-10-CM | POA: Diagnosis not present

## 2024-05-01 DIAGNOSIS — R82998 Other abnormal findings in urine: Secondary | ICD-10-CM | POA: Diagnosis not present

## 2024-05-01 DIAGNOSIS — Z1389 Encounter for screening for other disorder: Secondary | ICD-10-CM | POA: Diagnosis not present

## 2024-05-01 DIAGNOSIS — I130 Hypertensive heart and chronic kidney disease with heart failure and stage 1 through stage 4 chronic kidney disease, or unspecified chronic kidney disease: Secondary | ICD-10-CM | POA: Diagnosis not present

## 2024-05-01 DIAGNOSIS — I5022 Chronic systolic (congestive) heart failure: Secondary | ICD-10-CM | POA: Diagnosis not present

## 2024-05-01 DIAGNOSIS — N182 Chronic kidney disease, stage 2 (mild): Secondary | ICD-10-CM | POA: Diagnosis not present

## 2024-05-01 DIAGNOSIS — E785 Hyperlipidemia, unspecified: Secondary | ICD-10-CM | POA: Diagnosis not present

## 2024-05-01 DIAGNOSIS — F325 Major depressive disorder, single episode, in full remission: Secondary | ICD-10-CM | POA: Diagnosis not present

## 2024-05-21 ENCOUNTER — Ambulatory Visit (INDEPENDENT_AMBULATORY_CARE_PROVIDER_SITE_OTHER): Admitting: Podiatry

## 2024-05-21 DIAGNOSIS — Z91199 Patient's noncompliance with other medical treatment and regimen due to unspecified reason: Secondary | ICD-10-CM

## 2024-05-22 NOTE — Progress Notes (Signed)
 1. No-show for appointment

## 2024-05-30 DIAGNOSIS — R339 Retention of urine, unspecified: Secondary | ICD-10-CM | POA: Diagnosis not present

## 2024-05-30 DIAGNOSIS — N400 Enlarged prostate without lower urinary tract symptoms: Secondary | ICD-10-CM | POA: Diagnosis not present

## 2024-07-12 DIAGNOSIS — Z23 Encounter for immunization: Secondary | ICD-10-CM | POA: Diagnosis not present

## 2024-10-23 ENCOUNTER — Encounter: Payer: Self-pay | Admitting: Podiatry

## 2024-10-23 ENCOUNTER — Ambulatory Visit: Admitting: Podiatry

## 2024-10-23 DIAGNOSIS — M79675 Pain in left toe(s): Secondary | ICD-10-CM | POA: Diagnosis not present

## 2024-10-23 DIAGNOSIS — G629 Polyneuropathy, unspecified: Secondary | ICD-10-CM | POA: Diagnosis not present

## 2024-10-23 DIAGNOSIS — M79674 Pain in right toe(s): Secondary | ICD-10-CM

## 2024-10-23 DIAGNOSIS — B351 Tinea unguium: Secondary | ICD-10-CM | POA: Diagnosis not present

## 2024-10-23 MED ORDER — GABAPENTIN 300 MG PO CAPS: 300.0000 mg | ORAL_CAPSULE | Freq: Three times a day (TID) | ORAL | 3 refills | Status: AC

## 2024-10-24 NOTE — Progress Notes (Signed)
 Subjective:   Patient ID: Peter [name], male   DOB: 74 y.o.   MRN: [id number]   HPI Patient presents with 2 concerns.  He has developed severe thickness of his nailbeds and but not been cut the wall.  Taavon are painful but he is also developing a lot of pins-and-needles feeling in both of his feet that is worsened over the last 6 months but has been present close to a year.  Patient states that it is hard to sleep at times right foot over left foot   ROS      Objective:  Physical Exam  Vascular status was found to be intact neurologically there is diminishment of sharp dull vibratory occurring and he does give multiple clinical symptoms of neuropathic like condition.  Patient also has very dry cracked skin which could be part of this pathology and was noted to have nail disease with thickness yellow brittle elongated painful nailbeds 1-5 both feet     Assessment:  Neuropathic condition bilateral with mycotic nail infection and pain 1-5 both feet     Plan:  H&P reviewed I have recommended aggressive debridement accomplished.  We then discussed neuropathy he states it is to the point where it is affecting everyday activity and sleep and I am going to start him on gabapentin  300 mg 1 at night and if he tolerates well he can have 1 morning and midday as indicated for the condition.  If symptoms improve then this will be all we will do if not he may require further testing or other conditions.  All of this discussed with patient today and discussion of neuropathy with patient

## 2025-01-21 ENCOUNTER — Ambulatory Visit: Admitting: Podiatry
